# Patient Record
Sex: Female | Born: 1945 | Race: White | Hispanic: No | Smoking: Former smoker
Health system: Southern US, Community
[De-identification: ages and names within clinical notes are randomized; demographics above are authoritative.]

## PROBLEM LIST (undated history)

## (undated) DIAGNOSIS — I700 Atherosclerosis of aorta: Secondary | ICD-10-CM

## (undated) DIAGNOSIS — D699 Hemorrhagic condition, unspecified: Secondary | ICD-10-CM

## (undated) DIAGNOSIS — I10 Essential (primary) hypertension: Secondary | ICD-10-CM

## (undated) DIAGNOSIS — I5032 Chronic diastolic (congestive) heart failure: Secondary | ICD-10-CM

## (undated) DIAGNOSIS — C439 Malignant melanoma of skin, unspecified: Secondary | ICD-10-CM

## (undated) DIAGNOSIS — H9190 Unspecified hearing loss, unspecified ear: Secondary | ICD-10-CM

## (undated) DIAGNOSIS — I639 Cerebral infarction, unspecified: Secondary | ICD-10-CM

## (undated) DIAGNOSIS — R3915 Urgency of urination: Secondary | ICD-10-CM

## (undated) DIAGNOSIS — Z974 Presence of external hearing-aid: Secondary | ICD-10-CM

## (undated) DIAGNOSIS — K589 Irritable bowel syndrome without diarrhea: Secondary | ICD-10-CM

## (undated) DIAGNOSIS — K635 Polyp of colon: Secondary | ICD-10-CM

## (undated) DIAGNOSIS — R51 Headache: Secondary | ICD-10-CM

## (undated) DIAGNOSIS — K219 Gastro-esophageal reflux disease without esophagitis: Secondary | ICD-10-CM

## (undated) DIAGNOSIS — I6523 Occlusion and stenosis of bilateral carotid arteries: Secondary | ICD-10-CM

## (undated) DIAGNOSIS — E785 Hyperlipidemia, unspecified: Secondary | ICD-10-CM

## (undated) DIAGNOSIS — I2510 Atherosclerotic heart disease of native coronary artery without angina pectoris: Secondary | ICD-10-CM

## (undated) DIAGNOSIS — S82401A Unspecified fracture of shaft of right fibula, initial encounter for closed fracture: Secondary | ICD-10-CM

## (undated) DIAGNOSIS — Z8249 Family history of ischemic heart disease and other diseases of the circulatory system: Secondary | ICD-10-CM

## (undated) DIAGNOSIS — K5792 Diverticulitis of intestine, part unspecified, without perforation or abscess without bleeding: Secondary | ICD-10-CM

## (undated) DIAGNOSIS — I471 Supraventricular tachycardia, unspecified: Secondary | ICD-10-CM

## (undated) DIAGNOSIS — K7689 Other specified diseases of liver: Secondary | ICD-10-CM

## (undated) DIAGNOSIS — F419 Anxiety disorder, unspecified: Secondary | ICD-10-CM

## (undated) DIAGNOSIS — I708 Atherosclerosis of other arteries: Secondary | ICD-10-CM

## (undated) DIAGNOSIS — I480 Paroxysmal atrial fibrillation: Secondary | ICD-10-CM

## (undated) DIAGNOSIS — I771 Stricture of artery: Secondary | ICD-10-CM

## (undated) DIAGNOSIS — Z973 Presence of spectacles and contact lenses: Secondary | ICD-10-CM

## (undated) DIAGNOSIS — M1990 Unspecified osteoarthritis, unspecified site: Secondary | ICD-10-CM

## (undated) DIAGNOSIS — N390 Urinary tract infection, site not specified: Secondary | ICD-10-CM

## (undated) DIAGNOSIS — IMO0001 Reserved for inherently not codable concepts without codable children: Secondary | ICD-10-CM

## (undated) HISTORY — DX: Hyperlipidemia, unspecified: E78.5

## (undated) HISTORY — DX: Chronic diastolic (congestive) heart failure: I50.32

## (undated) HISTORY — PX: ANGIOPLASTY: SHX39

## (undated) HISTORY — PX: BREAST SURGERY: SHX581

## (undated) HISTORY — DX: Occlusion and stenosis of bilateral carotid arteries: I65.23

## (undated) HISTORY — DX: Atherosclerosis of aorta: I70.0

## (undated) HISTORY — PX: TONSILLECTOMY: SUR1361

## (undated) HISTORY — PX: MASTOID DEBRIDEMENT: SHX2002

## (undated) HISTORY — PX: EYE SURGERY: SHX253

## (undated) HISTORY — DX: Irritable bowel syndrome, unspecified: K58.9

## (undated) HISTORY — PX: AUGMENTATION MAMMAPLASTY: SUR837

## (undated) HISTORY — DX: Diverticulitis of intestine, part unspecified, without perforation or abscess without bleeding: K57.92

## (undated) HISTORY — PX: ABDOMINAL HYSTERECTOMY: SHX81

## (undated) HISTORY — DX: Family history of ischemic heart disease and other diseases of the circulatory system: Z82.49

## (undated) HISTORY — PX: COLON SURGERY: SHX602

## (undated) HISTORY — PX: APPENDECTOMY: SHX54

## (undated) HISTORY — DX: Unspecified osteoarthritis, unspecified site: M19.90

## (undated) HISTORY — PX: VAGINA SURGERY: SHX829

## (undated) HISTORY — PX: CHOLECYSTECTOMY: SHX55

## (undated) HISTORY — DX: Paroxysmal atrial fibrillation: I48.0

## (undated) HISTORY — PX: TUBAL LIGATION: SHX77

## (undated) HISTORY — DX: Headache: R51

## (undated) HISTORY — DX: Malignant melanoma of skin, unspecified: C43.9

## (undated) HISTORY — PX: JOINT REPLACEMENT: SHX530

## (undated) HISTORY — DX: Atherosclerosis of other arteries: I70.8

## (undated) HISTORY — PX: BREAST EXCISIONAL BIOPSY: SUR124

## (undated) HISTORY — DX: Gastro-esophageal reflux disease without esophagitis: K21.9

## (undated) HISTORY — DX: Atherosclerotic heart disease of native coronary artery without angina pectoris: I25.10

## (undated) HISTORY — PX: TONSILLECTOMY: SHX5217

## (undated) HISTORY — PX: COLONOSCOPY W/ POLYPECTOMY: SHX1380

## (undated) HISTORY — DX: Polyp of colon: K63.5

## (undated) HISTORY — PX: MASTOIDECTOMY REVISION: SUR856

## (undated) HISTORY — PX: SPINE SURGERY: SHX786

## (undated) HISTORY — DX: Cerebral infarction, unspecified: I63.9

## (undated) HISTORY — DX: Hemorrhagic condition, unspecified: D69.9

---

## 1997-10-23 ENCOUNTER — Encounter: Admission: RE | Admit: 1997-10-23 | Discharge: 1998-01-21 | Payer: Self-pay | Admitting: Internal Medicine

## 1998-01-22 ENCOUNTER — Encounter: Admission: RE | Admit: 1998-01-22 | Discharge: 1998-04-15 | Payer: Self-pay | Admitting: Internal Medicine

## 1998-04-15 ENCOUNTER — Encounter: Admission: RE | Admit: 1998-04-15 | Discharge: 1998-07-14 | Payer: Self-pay | Admitting: *Deleted

## 1998-07-05 ENCOUNTER — Inpatient Hospital Stay (HOSPITAL_COMMUNITY): Admission: RE | Admit: 1998-07-05 | Discharge: 1998-07-08 | Payer: Self-pay | Admitting: Neurosurgery

## 1998-07-23 ENCOUNTER — Emergency Department (HOSPITAL_COMMUNITY): Admission: EM | Admit: 1998-07-23 | Discharge: 1998-07-23 | Payer: Self-pay | Admitting: Emergency Medicine

## 1998-07-23 ENCOUNTER — Encounter: Payer: Self-pay | Admitting: Emergency Medicine

## 1998-10-27 ENCOUNTER — Other Ambulatory Visit: Admission: RE | Admit: 1998-10-27 | Discharge: 1998-10-27 | Payer: Self-pay | Admitting: Gynecology

## 1998-10-28 ENCOUNTER — Encounter: Admission: RE | Admit: 1998-10-28 | Discharge: 1998-12-02 | Payer: Self-pay | Admitting: Neurosurgery

## 1999-04-01 ENCOUNTER — Encounter: Admission: RE | Admit: 1999-04-01 | Discharge: 1999-04-01 | Payer: Self-pay | Admitting: Neurosurgery

## 1999-06-28 ENCOUNTER — Encounter: Admission: RE | Admit: 1999-06-28 | Discharge: 1999-06-28 | Payer: Self-pay | Admitting: Neurosurgery

## 1999-09-21 ENCOUNTER — Other Ambulatory Visit: Admission: RE | Admit: 1999-09-21 | Discharge: 1999-09-21 | Payer: Self-pay | Admitting: Gynecology

## 1999-11-20 ENCOUNTER — Emergency Department (HOSPITAL_COMMUNITY): Admission: EM | Admit: 1999-11-20 | Discharge: 1999-11-21 | Payer: Self-pay | Admitting: Emergency Medicine

## 1999-11-20 ENCOUNTER — Encounter: Payer: Self-pay | Admitting: Emergency Medicine

## 2000-01-11 ENCOUNTER — Emergency Department (HOSPITAL_COMMUNITY): Admission: EM | Admit: 2000-01-11 | Discharge: 2000-01-11 | Payer: Self-pay | Admitting: Emergency Medicine

## 2000-01-11 ENCOUNTER — Encounter: Payer: Self-pay | Admitting: Emergency Medicine

## 2000-01-18 ENCOUNTER — Ambulatory Visit (HOSPITAL_COMMUNITY): Admission: RE | Admit: 2000-01-18 | Discharge: 2000-01-18 | Payer: Self-pay | Admitting: *Deleted

## 2000-01-18 ENCOUNTER — Encounter: Payer: Self-pay | Admitting: *Deleted

## 2000-02-03 ENCOUNTER — Ambulatory Visit (HOSPITAL_COMMUNITY): Admission: RE | Admit: 2000-02-03 | Discharge: 2000-02-03 | Payer: Self-pay | Admitting: *Deleted

## 2000-02-03 ENCOUNTER — Encounter: Payer: Self-pay | Admitting: *Deleted

## 2000-03-07 ENCOUNTER — Encounter: Payer: Self-pay | Admitting: Surgery

## 2000-03-07 ENCOUNTER — Encounter (INDEPENDENT_AMBULATORY_CARE_PROVIDER_SITE_OTHER): Payer: Self-pay | Admitting: Specialist

## 2000-03-07 ENCOUNTER — Observation Stay (HOSPITAL_COMMUNITY): Admission: RE | Admit: 2000-03-07 | Discharge: 2000-03-07 | Payer: Self-pay | Admitting: Surgery

## 2000-07-07 ENCOUNTER — Ambulatory Visit (HOSPITAL_COMMUNITY): Admission: RE | Admit: 2000-07-07 | Discharge: 2000-07-07 | Payer: Self-pay | Admitting: Neurosurgery

## 2000-10-09 ENCOUNTER — Other Ambulatory Visit: Admission: RE | Admit: 2000-10-09 | Discharge: 2000-10-09 | Payer: Self-pay | Admitting: Gynecology

## 2000-12-13 ENCOUNTER — Ambulatory Visit (HOSPITAL_COMMUNITY): Admission: RE | Admit: 2000-12-13 | Discharge: 2000-12-13 | Payer: Self-pay | Admitting: Gastroenterology

## 2001-06-17 ENCOUNTER — Ambulatory Visit (HOSPITAL_COMMUNITY): Admission: RE | Admit: 2001-06-17 | Discharge: 2001-06-17 | Payer: Self-pay | Admitting: Specialist

## 2001-06-17 ENCOUNTER — Encounter: Payer: Self-pay | Admitting: Specialist

## 2001-09-12 ENCOUNTER — Ambulatory Visit (HOSPITAL_COMMUNITY): Admission: RE | Admit: 2001-09-12 | Discharge: 2001-09-12 | Payer: Self-pay | Admitting: Family Medicine

## 2001-09-12 ENCOUNTER — Encounter: Payer: Self-pay | Admitting: Family Medicine

## 2001-10-21 ENCOUNTER — Other Ambulatory Visit: Admission: RE | Admit: 2001-10-21 | Discharge: 2001-10-21 | Payer: Self-pay | Admitting: Gynecology

## 2002-03-19 ENCOUNTER — Encounter: Payer: Self-pay | Admitting: Cardiology

## 2002-05-23 ENCOUNTER — Emergency Department (HOSPITAL_COMMUNITY): Admission: EM | Admit: 2002-05-23 | Discharge: 2002-05-23 | Payer: Self-pay | Admitting: Emergency Medicine

## 2002-05-23 ENCOUNTER — Encounter: Payer: Self-pay | Admitting: Emergency Medicine

## 2002-11-04 ENCOUNTER — Ambulatory Visit (HOSPITAL_COMMUNITY): Admission: RE | Admit: 2002-11-04 | Discharge: 2002-11-04 | Payer: Self-pay | Admitting: Gynecology

## 2002-11-04 ENCOUNTER — Encounter: Payer: Self-pay | Admitting: Gynecology

## 2002-11-04 ENCOUNTER — Other Ambulatory Visit: Admission: RE | Admit: 2002-11-04 | Discharge: 2002-11-04 | Payer: Self-pay | Admitting: Gynecology

## 2003-04-22 ENCOUNTER — Ambulatory Visit (HOSPITAL_COMMUNITY): Admission: RE | Admit: 2003-04-22 | Discharge: 2003-04-22 | Payer: Self-pay | Admitting: Family Medicine

## 2003-11-16 ENCOUNTER — Other Ambulatory Visit: Admission: RE | Admit: 2003-11-16 | Discharge: 2003-11-16 | Payer: Self-pay | Admitting: Gynecology

## 2003-11-24 ENCOUNTER — Ambulatory Visit (HOSPITAL_COMMUNITY): Admission: RE | Admit: 2003-11-24 | Discharge: 2003-11-24 | Payer: Self-pay | Admitting: Gynecology

## 2004-12-01 ENCOUNTER — Other Ambulatory Visit: Admission: RE | Admit: 2004-12-01 | Discharge: 2004-12-01 | Payer: Self-pay | Admitting: Gynecology

## 2004-12-01 ENCOUNTER — Ambulatory Visit (HOSPITAL_COMMUNITY): Admission: RE | Admit: 2004-12-01 | Discharge: 2004-12-01 | Payer: Self-pay | Admitting: Gynecology

## 2005-06-29 ENCOUNTER — Ambulatory Visit (HOSPITAL_COMMUNITY): Admission: RE | Admit: 2005-06-29 | Discharge: 2005-06-29 | Payer: Self-pay | Admitting: Gynecology

## 2006-01-01 ENCOUNTER — Ambulatory Visit (HOSPITAL_COMMUNITY): Admission: RE | Admit: 2006-01-01 | Discharge: 2006-01-01 | Payer: Self-pay | Admitting: Gynecology

## 2006-01-18 ENCOUNTER — Other Ambulatory Visit: Admission: RE | Admit: 2006-01-18 | Discharge: 2006-01-18 | Payer: Self-pay | Admitting: Gynecology

## 2006-02-26 ENCOUNTER — Emergency Department (HOSPITAL_COMMUNITY): Admission: EM | Admit: 2006-02-26 | Discharge: 2006-02-26 | Payer: Self-pay | Admitting: Emergency Medicine

## 2006-09-08 ENCOUNTER — Encounter: Payer: Self-pay | Admitting: Gynecology

## 2006-09-08 ENCOUNTER — Inpatient Hospital Stay (HOSPITAL_COMMUNITY): Admission: EM | Admit: 2006-09-08 | Discharge: 2006-09-11 | Payer: Self-pay | Admitting: Emergency Medicine

## 2007-02-06 ENCOUNTER — Ambulatory Visit (HOSPITAL_COMMUNITY): Admission: RE | Admit: 2007-02-06 | Discharge: 2007-02-06 | Payer: Self-pay | Admitting: Surgery

## 2007-03-11 ENCOUNTER — Ambulatory Visit (HOSPITAL_COMMUNITY): Admission: RE | Admit: 2007-03-11 | Discharge: 2007-03-11 | Payer: Self-pay | Admitting: Gynecology

## 2007-03-11 ENCOUNTER — Other Ambulatory Visit: Admission: RE | Admit: 2007-03-11 | Discharge: 2007-03-11 | Payer: Self-pay | Admitting: Gynecology

## 2007-08-22 ENCOUNTER — Encounter: Admission: RE | Admit: 2007-08-22 | Discharge: 2007-08-22 | Payer: Self-pay | Admitting: Surgery

## 2007-10-03 ENCOUNTER — Encounter (INDEPENDENT_AMBULATORY_CARE_PROVIDER_SITE_OTHER): Payer: Self-pay | Admitting: Surgery

## 2007-10-03 ENCOUNTER — Inpatient Hospital Stay (HOSPITAL_COMMUNITY): Admission: RE | Admit: 2007-10-03 | Discharge: 2007-10-09 | Payer: Self-pay | Admitting: Orthopedic Surgery

## 2007-10-22 LAB — HM COLONOSCOPY

## 2007-11-12 ENCOUNTER — Ambulatory Visit (HOSPITAL_BASED_OUTPATIENT_CLINIC_OR_DEPARTMENT_OTHER): Admission: RE | Admit: 2007-11-12 | Discharge: 2007-11-13 | Payer: Self-pay | Admitting: Orthopedic Surgery

## 2008-01-09 ENCOUNTER — Encounter: Admission: RE | Admit: 2008-01-09 | Discharge: 2008-04-08 | Payer: Self-pay | Admitting: Orthopedic Surgery

## 2008-03-17 ENCOUNTER — Encounter: Payer: Self-pay | Admitting: Internal Medicine

## 2008-03-17 ENCOUNTER — Ambulatory Visit (HOSPITAL_COMMUNITY): Admission: RE | Admit: 2008-03-17 | Discharge: 2008-03-17 | Payer: Self-pay | Admitting: Gynecology

## 2008-12-01 ENCOUNTER — Emergency Department (HOSPITAL_COMMUNITY): Admission: EM | Admit: 2008-12-01 | Discharge: 2008-12-01 | Payer: Self-pay | Admitting: Emergency Medicine

## 2008-12-16 ENCOUNTER — Ambulatory Visit: Payer: Self-pay | Admitting: Internal Medicine

## 2008-12-16 DIAGNOSIS — M2390 Unspecified internal derangement of unspecified knee: Secondary | ICD-10-CM | POA: Insufficient documentation

## 2008-12-16 DIAGNOSIS — E785 Hyperlipidemia, unspecified: Secondary | ICD-10-CM | POA: Insufficient documentation

## 2008-12-16 DIAGNOSIS — Z860100 Personal history of colon polyps, unspecified: Secondary | ICD-10-CM | POA: Insufficient documentation

## 2008-12-16 DIAGNOSIS — R519 Headache, unspecified: Secondary | ICD-10-CM | POA: Insufficient documentation

## 2008-12-16 DIAGNOSIS — Z8719 Personal history of other diseases of the digestive system: Secondary | ICD-10-CM | POA: Insufficient documentation

## 2008-12-16 DIAGNOSIS — M1990 Unspecified osteoarthritis, unspecified site: Secondary | ICD-10-CM | POA: Insufficient documentation

## 2008-12-16 DIAGNOSIS — K219 Gastro-esophageal reflux disease without esophagitis: Secondary | ICD-10-CM | POA: Insufficient documentation

## 2008-12-16 DIAGNOSIS — R51 Headache: Secondary | ICD-10-CM | POA: Insufficient documentation

## 2009-04-06 ENCOUNTER — Encounter: Payer: Self-pay | Admitting: Internal Medicine

## 2009-04-06 ENCOUNTER — Ambulatory Visit (HOSPITAL_COMMUNITY): Admission: RE | Admit: 2009-04-06 | Discharge: 2009-04-06 | Payer: Self-pay | Admitting: Gynecology

## 2009-04-19 ENCOUNTER — Encounter: Payer: Self-pay | Admitting: Internal Medicine

## 2009-04-28 ENCOUNTER — Ambulatory Visit: Payer: Self-pay | Admitting: Internal Medicine

## 2009-04-28 DIAGNOSIS — R799 Abnormal finding of blood chemistry, unspecified: Secondary | ICD-10-CM | POA: Insufficient documentation

## 2009-04-28 DIAGNOSIS — R109 Unspecified abdominal pain: Secondary | ICD-10-CM | POA: Insufficient documentation

## 2009-04-28 DIAGNOSIS — R7303 Prediabetes: Secondary | ICD-10-CM | POA: Insufficient documentation

## 2009-04-28 LAB — CONVERTED CEMR LAB
ALT: 20 units/L (ref 0–35)
AST: 23 units/L (ref 0–37)
Alkaline Phosphatase: 54 units/L (ref 39–117)
Basophils Absolute: 0.1 10*3/uL (ref 0.0–0.1)
Bilirubin Urine: NEGATIVE
Bilirubin, Direct: 0.1 mg/dL (ref 0.0–0.3)
CRP, High Sensitivity: 4.8 (ref 0.00–5.00)
Calcium: 9.8 mg/dL (ref 8.4–10.5)
Chloride: 101 meq/L (ref 96–112)
Creatinine, Ser: 1.2 mg/dL (ref 0.4–1.2)
Eosinophils Absolute: 0.1 10*3/uL (ref 0.0–0.7)
Hemoglobin: 13.3 g/dL (ref 12.0–15.0)
Ketones, ur: NEGATIVE mg/dL
Leukocytes, UA: NEGATIVE
Lymphocytes Relative: 27.2 % (ref 12.0–46.0)
MCHC: 33 g/dL (ref 30.0–36.0)
Neutro Abs: 2.9 10*3/uL (ref 1.4–7.7)
Platelets: 248 10*3/uL (ref 150.0–400.0)
RDW: 12.5 % (ref 11.5–14.6)
Sodium: 136 meq/L (ref 135–145)
Specific Gravity, Urine: 1.01 (ref 1.000–1.030)
Total CK: 115 units/L (ref 7–177)
Total Protein: 6.8 g/dL (ref 6.0–8.3)
Urobilinogen, UA: 0.2 (ref 0.0–1.0)

## 2009-04-28 LAB — HM DIABETES FOOT EXAM

## 2009-04-29 ENCOUNTER — Encounter: Payer: Self-pay | Admitting: Internal Medicine

## 2009-05-03 ENCOUNTER — Encounter: Admission: RE | Admit: 2009-05-03 | Discharge: 2009-05-03 | Payer: Self-pay | Admitting: Internal Medicine

## 2010-02-09 ENCOUNTER — Encounter: Payer: Self-pay | Admitting: Internal Medicine

## 2010-04-20 ENCOUNTER — Ambulatory Visit (HOSPITAL_COMMUNITY)
Admission: RE | Admit: 2010-04-20 | Discharge: 2010-04-20 | Payer: Self-pay | Source: Home / Self Care | Attending: Gynecology | Admitting: Gynecology

## 2010-05-17 NOTE — Letter (Signed)
Summary: Dr.Charles Jen Mow, M.D.  Dr.Charles Jen Mow, M.D.   Imported By: Sherian Rein 04/30/2009 09:09:42  _____________________________________________________________________  External Attachment:    Type:   Image     Comment:   External Document

## 2010-05-17 NOTE — Letter (Signed)
Summary: Dr.Charles Jen Mow, M.D.  Dr.Charles Jen Mow, M.D.   Imported By: Sherian Rein 04/30/2009 09:08:40  _____________________________________________________________________  External Attachment:    Type:   Image     Comment:   External Document

## 2010-05-17 NOTE — Letter (Signed)
Summary: Results Follow-up Letter  [facility]  [address] [place], [place] [address]   Phone: [phone]  Fax: [phone]    04/29/2009  [address] [place], [place]  [address]  Dear Ms. Arlan Organ,   The following are the results of your recent test(s):  Test     Result     CBC       normal Muscle enzyme   normal CRP       normal, 4.8 now Liver/kidney   normal Urine       normal Rheum. test    negative  _________________________________________________________  Please call for an appointment as directed _________________________________________________________ _________________________________________________________ _________________________________________________________  Sincerely,  Sanda Linger MD [facility]

## 2010-05-17 NOTE — Assessment & Plan Note (Signed)
Summary: FU ON LABS FROM DR. CHARLES LOMAX/NWS  #   Vital Signs:  Patient profile:   65 year old female Height:      63 inches Weight:      161 pounds O2 Sat:      96 % on Room air Temp:     97.6 degrees F oral Pulse rate:   84 / minute Pulse rhythm:   regular Resp:     16 per minute BP sitting:   128 / 72  (left arm) Cuff size:   large  Vitals Entered By: Rock Nephew CMA (April 28, 2009 3:48 PM)  O2 Flow:  Room air  Primary Care Provider:  Etta Grandchild MD   History of Present Illness: She returns to review labs that were done by Dr. Nicholas Lose, [facility]. She had a high CRP at 24.   Dyspepsia History:      She has no alarm features of dyspepsia including no history of melena, hematochezia, dysphagia, persistent vomiting, or involuntary weight loss > 5%.  There is a prior history of GERD.  The patient does not have a prior history of documented ulcer disease.  The dominant symptom is heartburn or acid reflux.  An H-2 blocker medication is currently being taken.  She notes that the symptoms have improved with the H-2 blocker therapy.  Symptoms have not persisted after 4 weeks of H-2 blocker treatment.  A prior EGD has been done which showed moderate or severe esophagitis.     Preventive Screening-Counseling & Management  Alcohol-Tobacco     Alcohol drinks/day: 0     Smoking Status: never  Hep-HIV-STD-Contraception     Hepatitis Risk: no risk noted     HIV Risk: no risk noted     STD Risk: no risk noted     SBE monthly: yes      Sexual History:  currently monogamous.        Drug Use:  never.        Blood Transfusions:  no.    Current Medications (verified): 1)  Celebrex 200 Mg Caps (Celecoxib) .... Take 1 Tablet By Mouth Once A Day 2)  Lotronex 1 Mg Tabs (Alosetron Hcl) .... Take 1 Tablet By Mouth Two Times A Day 3)  Nexium 40 Mg Cpdr (Esomeprazole Magnesium) .... Take 1 Tablet By Mouth Two Times A Day 4)  Crestor 10 Mg Tabs (Rosuvastatin Calcium) .... Take 1 Tablet By  Mouth Once A Day 5)  Trazodone Hcl 100 Mg Tabs (Trazodone Hcl) .... Take 1 Tab By Mouth At Bedtime 6)  Vivelle-Dot 0.05 Mg/24hr Pttw (Estradiol) .... X 2wk 7)  Xanax 0.5 Mg Tabs (Alprazolam) .... As Needed 8)  Align  Caps (Probiotic Product) .... Take 1 Tablet By Mouth Once A Day 9)  Zyrtec Allergy 10 Mg Tabs (Cetirizine Hcl) .... Take 1 Tablet By Mouth Once A Day 10)  Fish Oil 1000mg  .... 2 Tabs Once Daily 11)  Calcium Citrate .... Take 1 Tablet By Mouth Two Times A Day 12)  Vitamin D3 2000 Unit Caps (Cholecalciferol) .... Take 1 Tablet By Mouth Once A Day 13)  Magnesium Oxide .... Take 1 Tablet By Mouth Once A Day  Allergies (verified): 1)  ! Daypro (Oxaprozin) 2)  ! Zocor  Past History:  Past Medical History: Colonic polyps, hx of Diverticulitis, hx of GERD Headache Hyperlipidemia Osteoarthritis Diabetes mellitus, type II  Past Surgical History: Reviewed history from 12/16/2008 and no changes required. Appendectomy Cervical fusion Cholecystectomy Colectomy Hysterectomy  Tonsillectomy  Family History: Reviewed history from 12/16/2008 and no changes required. Family History High cholesterol Family History Hypertension Family History Lung cancer Family History Other cancer: eso and stomach Family History of Stroke M 1st degree relative <50  Social History: Reviewed history from 12/16/2008 and no changes required. Occupation: Infusion nurse at [employer] Alcohol use-yes Drug use-no Regular exercise-no Smoking Status:  never Hepatitis Risk:  no risk noted HIV Risk:  no risk noted STD Risk:  no risk noted Sexual History:  currently monogamous Drug Use:  never Blood Transfusions:  no  Review of Systems  The patient denies anorexia, fever, weight loss, weight gain, chest pain, syncope, dyspnea on exertion, peripheral edema, prolonged cough, headaches, hemoptysis, abdominal pain, melena, hematochezia, severe indigestion/heartburn, hematuria, suspicious  skin lesions, abnormal bleeding, enlarged lymph nodes, and angioedema.   GI:  Complains of abdominal pain and diarrhea; denies change in bowel habits, excessive appetite, gas, indigestion, loss of appetite, nausea, vomiting, vomiting blood, and yellowish skin color. MS:  Complains of muscle aches and cramps; denies joint pain, joint redness, joint swelling, loss of strength, low back pain, mid back pain, muscle weakness, stiffness, and thoracic pain.  Physical Exam  General:  alert, well-developed, well-nourished, well-hydrated, appropriate dress, normal appearance, healthy-appearing, cooperative to examination, and good hygiene.   Head:  normocephalic, atraumatic, no abnormalities observed, and no abnormalities palpated.   Eyes:  No corneal or conjunctival inflammation noted. EOMI. Perrla. Funduscopic exam benign, without hemorrhages, exudates or papilledema. Vision grossly normal. Mouth:  Oral mucosa and oropharynx without lesions or exudates.  Teeth in good repair. Neck:  supple, full ROM, no masses, no thyromegaly, no JVD, normal carotid upstroke, no carotid bruits, no cervical lymphadenopathy, and no neck tenderness.   Chest Wall:  No deformities, masses, or tenderness noted. Breasts:  No mass, nodules, thickening, tenderness, bulging, retraction, inflamation, nipple discharge or skin changes noted.   Lungs:  Normal respiratory effort, chest expands symmetrically. Lungs are clear to auscultation, no crackles or wheezes. Heart:  Normal rate and regular rhythm. S1 and S2 normal without gallop, murmur, click, rub or other extra sounds. Abdomen:  soft, normal bowel sounds, no distention, no masses, no guarding, no rigidity, no rebound tenderness, no abdominal hernia, no inguinal hernia, no hepatomegaly, no splenomegaly, and RLQ tenderness.   Msk:  normal ROM, no joint tenderness, no joint swelling, no joint warmth, no redness over joints, no joint deformities, no joint instability, and no  crepitation.   Pulses:  R and L carotid,radial,femoral,dorsalis pedis and posterior tibial pulses are full and equal bilaterally Extremities:  No clubbing, cyanosis, edema, or deformity noted with normal full range of motion of all joints.   Neurologic:  No cranial nerve deficits noted. Station and gait are normal. Plantar reflexes are down-going bilaterally. DTRs are symmetrical throughout. Sensory, motor and coordinative functions appear intact. Skin:  Intact without suspicious lesions or rashes Cervical Nodes:  no anterior cervical adenopathy and no posterior cervical adenopathy.   Axillary Nodes:  no R axillary adenopathy and no L axillary adenopathy.   Inguinal Nodes:  no R inguinal adenopathy and no L inguinal adenopathy.   Psych:  Cognition and judgment appear intact. Alert and cooperative with normal attention span and concentration. No apparent delusions, illusions, hallucinations  Diabetes Management Exam:    Foot Exam (with socks and/or shoes not present):       Sensory-Pinprick/Light touch:          Left medial foot (L-4): normal  Left dorsal foot (L-5): normal          Left lateral foot (S-1): normal          Right medial foot (L-4): normal          Right dorsal foot (L-5): normal          Right lateral foot (S-1): normal       Sensory-Monofilament:          Left foot: normal          Right foot: normal       Inspection:          Left foot: normal          Right foot: normal       Nails:          Left foot: normal          Right foot: normal   Impression & Recommendations:  Problem # 1:  DIABETES MELLITUS, TYPE II (ICD-250.00) Assessment New A1C done by Dr. 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 Orders: Venipuncture (78469) TLB-Hepatic/Liver Function Pnl (80076-HEPATIC) TLB-CBC Platelet - w/Differential (85025-CBCD) TLB-BMP (Basic  Metabolic Panel-BMET) (80048-METABOL) TLB-CK Total Only(Creatine Kinase/CPK) (82550-CK) TLB-CRP-High Sensitivity (C-Reactive Protein) (86140-FCRP) TLB-Udip ONLY (81003-UDIP) TLB-Rheumatoid Factor (RA) (62952-WU) T-Antinuclear Antib (ANA) (13244-01027)  Problem # 3:  PELVIC PAIN, RIGHT (ICD-789.09) Assessment: New will get an ultrasound done to look at the right ovary Her updated medication list for this problem includes:    Celebrex 200 Mg Caps (Celecoxib) .Marland Kitchen... Take 1 tablet by mouth once a day  Orders: Venipuncture (25366) TLB-Hepatic/Liver Function Pnl (80076-HEPATIC) TLB-CBC Platelet - w/Differential (85025-CBCD) TLB-BMP (Basic Metabolic Panel-BMET) (80048-METABOL) TLB-CK Total Only(Creatine Kinase/CPK) (82550-CK) TLB-CRP-High Sensitivity (C-Reactive Protein) (86140-FCRP) TLB-Udip ONLY (81003-UDIP) TLB-Rheumatoid Factor (RA) (44034-VQ) T-Antinuclear Antib (ANA) (25956-38756) Radiology Referral (Radiology)  Problem # 4:  HYPERLIPIDEMIA (ICD-272.4) Assessment: Unchanged  Her updated medication list for this problem includes:    Crestor 10 Mg Tabs (Rosuvastatin calcium) .Marland Kitchen... Take 1 tablet by mouth once a day  Orders: Venipuncture (43329) TLB-Hepatic/Liver Function Pnl (80076-HEPATIC) TLB-CBC Platelet - w/Differential (85025-CBCD) TLB-BMP (Basic Metabolic Panel-BMET) (80048-METABOL) TLB-CK Total Only(Creatine Kinase/CPK) (82550-CK) TLB-CRP-High Sensitivity (C-Reactive Protein) (86140-FCRP) TLB-Udip ONLY (81003-UDIP) TLB-Rheumatoid Factor (RA) (51884-ZY) T-Antinuclear Antib (ANA) (60630-16010)  Complete Medication List: 1)  Celebrex 200 Mg Caps (Celecoxib) .... Take 1 tablet by mouth once a day 2)  Lotronex 1 Mg Tabs (Alosetron hcl) .... Take 1 tablet by mouth two times a day 3)  Nexium 40 Mg Cpdr (Esomeprazole magnesium) .... Take 1 tablet by mouth two times a day 4)  Crestor 10 Mg Tabs (Rosuvastatin calcium) .... Take 1 tablet by mouth once a day 5)  Trazodone Hcl 100  Mg Tabs (Trazodone hcl) .... Take 1 tab by mouth at bedtime 6)  Vivelle-dot 0.05 Mg/24hr Pttw (Estradiol) .... X 2wk 7)  Xanax 0.5 Mg Tabs (Alprazolam) .... As needed 8)  Align Caps (Probiotic product) .... Take 1 tablet by mouth once a day 9)  Zyrtec Allergy 10 Mg Tabs (Cetirizine hcl) .... Take 1 tablet by mouth once a day 10)  Fish Oil 1000mg   .... 2 tabs once daily 11)  Calcium Citrate  .... Take 1 tablet by mouth two times a day 12)  Vitamin D3 2000 Unit Caps (Cholecalciferol) .... Take 1 tablet by mouth once a day 13)  Magnesium Oxide  .... Take  1 tablet by mouth once a day  Other Orders: Pneumococcal Vaccine Ped < 41yrs (81191) Admin 1st Vaccine (47829) Zoster (Shingles) Vaccine Live (56213) Admin of Any Addtl Vaccine (08657)  Patient Instructions: 1)  Please schedule a follow-up appointment in 2 weeks. 2)  It is important that you exercise regularly at least 20 minutes 5 times a week. If you develop chest pain, have severe difficulty breathing, or feel very tired , stop exercising immediately and seek medical attention. 3)  You need to lose weight. Consider a lower calorie diet and regular exercise.  4)  Check your blood sugars regularly. If your readings are usually above 200  or below 70 you should contact our office. 5)  It is important that your Diabetic A1c level is checked every 3 months. 6)  See your eye doctor yearly to check for diabetic eye damage. 7)  Check your feet each night for sore areas, calluses or signs of infection. 8)  Check your Blood Pressure regularly. If it is above: you should make an appointment.   Immunizations Administered:  Pediatric Pneumococcal Vaccine:    Vaccine Type: Prevnar    Site: right deltoid    Mfr: Merck    Dose: 0.5 ml    Route: IM    Given by: Ami Bullins CMA    Exp. Date: 03/27/2010    Lot #: [id number]    VIS given: 03/26/07 version given April 28, 2009.  Zostavax # 1:    Vaccine Type: Zostavax    Site: left deltoid    Mfr:  Merck    Dose: 0.5 ml    Route: IM    Given by: Ami Bullins CMA    Exp. Date: 05/14/2010    Lot #: [id number]    VIS given: 01/27/05 given April 28, 2009.

## 2010-05-17 NOTE — Letter (Signed)
Summary: [facility]   Imported By: Sherian Rein 02/18/2010 08:28:40  _____________________________________________________________________  External Attachment:    Type:   Image     Comment:   External Document

## 2010-08-19 ENCOUNTER — Encounter: Payer: Self-pay | Admitting: Internal Medicine

## 2010-08-22 ENCOUNTER — Encounter: Payer: Self-pay | Admitting: Internal Medicine

## 2010-08-22 ENCOUNTER — Other Ambulatory Visit (INDEPENDENT_AMBULATORY_CARE_PROVIDER_SITE_OTHER): Payer: Medicare Other

## 2010-08-22 ENCOUNTER — Ambulatory Visit (INDEPENDENT_AMBULATORY_CARE_PROVIDER_SITE_OTHER): Payer: Medicare Other | Admitting: Internal Medicine

## 2010-08-22 DIAGNOSIS — E785 Hyperlipidemia, unspecified: Secondary | ICD-10-CM

## 2010-08-22 DIAGNOSIS — I10 Essential (primary) hypertension: Secondary | ICD-10-CM

## 2010-08-22 DIAGNOSIS — R799 Abnormal finding of blood chemistry, unspecified: Secondary | ICD-10-CM

## 2010-08-22 DIAGNOSIS — R51 Headache: Secondary | ICD-10-CM

## 2010-08-22 DIAGNOSIS — E119 Type 2 diabetes mellitus without complications: Secondary | ICD-10-CM

## 2010-08-22 LAB — URINALYSIS, ROUTINE W REFLEX MICROSCOPIC
Leukocytes, UA: NEGATIVE
Nitrite: NEGATIVE
Specific Gravity, Urine: <=1.005 (ref 1.000–1.030)
Urine Glucose: NEGATIVE
Urobilinogen, UA: 0.2 (ref 0.0–1.0)

## 2010-08-22 LAB — HIGH SENSITIVITY CRP: CRP, High Sensitivity: 4.36 mg/L (ref 0.00–5.00)

## 2010-08-22 LAB — CBC WITH DIFFERENTIAL/PLATELET
Basophils Relative: 0.7 % (ref 0.0–3.0)
Eosinophils Relative: 1.8 % (ref 0.0–5.0)
MCV: 96.7 fl (ref 78.0–100.0)
Monocytes Absolute: 0.5 10*3/uL (ref 0.1–1.0)
Monocytes Relative: 8.4 % (ref 3.0–12.0)
Neutrophils Relative %: 60 % (ref 43.0–77.0)
RBC: 3.92 Mil/uL (ref 3.87–5.11)
WBC: 5.4 10*3/uL (ref 4.5–10.5)

## 2010-08-22 LAB — TSH: TSH: 0.86 u[IU]/mL (ref 0.35–5.50)

## 2010-08-22 LAB — LIPID PANEL: VLDL: 21.2 mg/dL (ref 0.0–40.0)

## 2010-08-22 MED ORDER — OLMESARTAN MEDOXOMIL 40 MG PO TABS
40.0000 mg | ORAL_TABLET | Freq: Every day | ORAL | Status: DC
Start: 2010-08-22 — End: 2011-08-22

## 2010-08-22 NOTE — Patient Instructions (Signed)
Diabetes, Type 2 Diabetes is a lasting (chronic) disease. In type 2 diabetes, the pancreas does not make enough insulin (a hormone), and the body does not respond normally to the insulin that is made. This type of diabetes was also previously called adult onset diabetes. About 90% of all those who have diabetes have type 2. It usually occurs after the age of 40 but can occur at any age. CAUSES Unlike type 1 diabetes, which happens because insulin is no longer being made, type 2 diabetes happens because the body is making less insulin and has trouble using the insulin properly. SYMPTOMS  Drinking more than usual.   Urinating more than usual.   Blurred vision.   Dry, itchy skin.   Frequent infection like yeast infections in women.   More tired than usual (fatigue).  TREATMENT  Healthy eating.   Exercise.   Medication, if needed.   Monitoring blood glucose (sugar).   Seeing your caregiver regularly.  HOME CARE INSTRUCTIONS  Check your blood glucose (sugar) at least once daily. More frequent monitoring may be necessary, depending on your medications and on how well your diabetes is controlled. Your caregiver will advise you.   Take your medicine as directed by your caregiver.   Do not smoke.   Make wise food choices. Ask your caregiver for information. Weight loss can improve your diabetes.   Learn about low blood glucose (hypoglycemia) and how to treat it.   Get your eyes checked regularly.   Have a yearly physical exam. Have your blood pressure checked. Get your blood and urine tested.   Wear a pendant or bracelet saying that you have diabetes.   Check your feet every night for sores. Let your caregiver know if you have sores that are not healing.  SEEK MEDICAL CARE IF:  You are having problems keeping your blood glucose at target range.   You feel you might be having problems with your medicines.   You have symptoms of an illness that is not improving after 24  hours.   You have a sore or wound that is not healing.   You notice a change in vision or a new problem with your vision.  You develop a fever of more than 100.5Hypertension (High Blood Pressure) As your heart beats, it forces blood through your arteries. This force is your blood pressure. If the pressure is too high, it is called hypertension (HTN) or high blood pressure. HTN is dangerous because you may have it and not know it. High blood pressure may mean that your heart has to work harder to pump blood. Your arteries may be narrow or stiff. The extra work puts you at risk for heart disease, stroke, and other problems.  Blood pressure consists of two numbers, a higher number over a lower, 110/72, for example. It is stated as "110 over 72." The ideal is below 120 for the top number (systolic) and under 80 for the bottom (diastolic). Write down your blood pressure today. You should pay close attention to your blood pressure if you have certain conditions such as: Heart failure. Prior heart attack.  Diabetes  Chronic kidney disease.  Prior stroke.  Multiple risk factors for heart disease.   To see if you have HTN, your blood pressure should be measured while you are seated with your arm held at the level of the heart. It should be measured at least twice. A one-time elevated blood pressure reading (especially in the Emergency Department) does not mean   that you need treatment. There may be conditions in which the blood pressure is different between your right and left arms. It is important to see your caregiver soon for a recheck. Most people have essential hypertension which means that there is not a specific cause. This type of high blood pressure may be lowered by changing lifestyle factors such as: Stress. Smoking.  Lack of exercise.  Excessive weight. Drug/tobacco/alcohol use.  Eating less salt.   Most people do not have symptoms from high blood pressure until it has caused damage to the  body. Effective treatment can often prevent, delay or reduce that damage. TREATMENT Treatment for high blood pressure, when a cause has been identified, is directed at the cause. There are a large number of medications to treat HTN. These fall into several categories, and your caregiver will help you select the medicines that are best for you. Medications may have side effects. You should review side effects with your caregiver. If your blood pressure stays high after you have made lifestyle changes or started on medicines,  Your medication(s) may need to be changed.  Other problems may need to be addressed.  Be certain you understand your prescriptions, and know how and when to take your medicine.  Be sure to follow up with your caregiver within the time frame advised (usually within two weeks) to have your blood pressure rechecked and to review your medications.  If you are taking more than one medicine to lower your blood pressure, make sure you know how and at what times they should be taken. Taking two medicines at the same time can result in blood pressure that is too low.  SEEK IMMEDIATE MEDICAL CARE IF YOU DEVELOP: A severe headache, blurred or changing vision, or confusion.  Unusual weakness or numbness, or a faint feeling.  Severe chest or abdominal pain, vomiting, or breathing problems.  MAKE SURE YOU:  Understand these instructions.  Will watch your condition.  Will get help right away if you are not doing well or get worse.  Document Released: 04/03/2005 Document Re-Released: 09/21/2009  [employer] Patient Information 2011 [employer]  Document Released: 04/03/2005 Document Re-Released: 04/25/2009 [employer] Patient Information 2011 [employer]. 

## 2010-08-22 NOTE — Progress Notes (Signed)
Subjective:    Patient ID: Susan Gillespie, female    DOB: [birth year], 65 y.o.   MRN: [id number]  Hypertension This is a new problem. The current episode started 1 to 4 weeks ago. The problem has been rapidly worsening since onset. The problem is uncontrolled. Associated symptoms include headaches. Pertinent negatives include no anxiety, blurred vision, chest pain, malaise/fatigue, neck pain, orthopnea, palpitations, peripheral edema, PND, shortness of breath or sweats. There are no associated agents to hypertension. Past treatments include nothing. Compliance problems include exercise.   Headache  Pertinent negatives include no abdominal pain, back pain, blurred vision, coughing, dizziness, fever, nausea, neck pain, numbness, seizures, visual change, vomiting, weakness or weight loss. Her past medical history is significant for hypertension.  Diabetes She presents for her follow-up diabetic visit. She has type 2 diabetes mellitus. No MedicAlert identification noted. Her disease course has been improving. Hypoglycemia symptoms include headaches. Pertinent negatives for hypoglycemia include no confusion, dizziness, hunger, mood changes, nervousness/anxiousness, pallor, seizures, sleepiness, speech difficulty, sweats or tremors. Pertinent negatives for diabetes include no blurred vision, no chest pain, no fatigue, no foot paresthesias, no foot ulcerations, no polydipsia, no polyphagia, no polyuria, no visual change, no weakness and no weight loss. There are no hypoglycemic complications. Symptoms are stable. There are no diabetic complications. Risk factors for coronary artery disease include no known risk factors. Current diabetic treatment includes diet. She is compliant with treatment some of the time. Her weight is stable. She is following a generally healthy diet. Meal planning includes avoidance of concentrated sweets. She has not had a previous visit with a dietician. She participates in exercise  intermittently. There is no change in her home blood glucose trend. Her breakfast blood glucose range is generally 90-110 mg/dl. Her lunch blood glucose range is generally 90-110 mg/dl. Her dinner blood glucose range is generally 90-110 mg/dl. Her highest blood glucose is 90-110 mg/dl. Her overall blood glucose range is 90-110 mg/dl. An ACE inhibitor/angiotensin II receptor blocker is not being taken. She sees a podiatrist.Eye exam is current.      Review of Systems  Constitutional: Negative for fever, chills, weight loss, malaise/fatigue, diaphoresis, activity change, appetite change, fatigue and unexpected weight change.  HENT: Negative for facial swelling, neck pain and neck stiffness.   Eyes: Negative for blurred vision.  Respiratory: Negative for apnea, cough, choking, chest tightness, shortness of breath, wheezing and stridor.   Cardiovascular: Negative for chest pain, palpitations, orthopnea, leg swelling and PND.  Gastrointestinal: Negative for nausea, vomiting, abdominal pain, diarrhea, constipation and abdominal distention.  Genitourinary: Negative for dysuria, urgency, polyuria, frequency, hematuria, decreased urine volume, enuresis, difficulty urinating and dyspareunia.  Musculoskeletal: Negative for myalgias, back pain, joint swelling, arthralgias and gait problem.  Skin: Negative for color change, pallor and rash.  Neurological: Positive for headaches. Negative for dizziness, tremors, seizures, syncope, facial asymmetry, speech difficulty, weakness, light-headedness and numbness.  Hematological: Negative for polydipsia, polyphagia and adenopathy. Does not bruise/bleed easily.  Psychiatric/Behavioral: Negative for suicidal ideas, hallucinations, behavioral problems, confusion, sleep disturbance, self-injury, dysphoric mood, decreased concentration and agitation. The patient is not nervous/anxious and is not hyperactive.        Objective:   Physical Exam  Constitutional: She is  oriented to person, place, and time. She appears well-developed and well-nourished. No distress.  HENT:  Head: Normocephalic and atraumatic.  Right Ear: External ear normal.  Left Ear: External ear normal.  Nose: Nose normal.  Mouth/Throat: Oropharynx is clear and moist. No oropharyngeal exudate.  Eyes:  Conjunctivae and EOM are normal. Pupils are equal, round, and reactive to light. Right eye exhibits no discharge. Left eye exhibits no discharge. No scleral icterus.  Neck: Normal range of motion. Neck supple. No JVD present. No tracheal deviation present. No thyromegaly present.  Cardiovascular: Normal rate, regular rhythm, normal heart sounds and intact distal pulses.  Exam reveals no gallop and no friction rub.   No murmur heard. Pulmonary/Chest: Effort normal and breath sounds normal. No stridor. No respiratory distress. She has no wheezes. She has no rales. She exhibits no tenderness.  Abdominal: Soft. Bowel sounds are normal. She exhibits no distension and no mass. There is no tenderness. There is no rebound and no guarding.  Musculoskeletal: Normal range of motion. She exhibits no edema and no tenderness.  Lymphadenopathy:    She has no cervical adenopathy.  Neurological: She is alert and oriented to person, place, and time. She has normal reflexes. She displays normal reflexes. No cranial nerve deficit. She exhibits normal muscle tone. Coordination normal.  Skin: Skin is warm and dry. No rash noted. She is not diaphoretic. No erythema. No pallor.  Psychiatric: She has a normal mood and affect. Her behavior is normal. Judgment and thought content normal.        Lab Results  Component Value Date   WBC 4.9 04/28/2009   HGB 13.3 04/28/2009   HCT 40.4 04/28/2009   PLT 248.0 04/28/2009   ALT 20 04/28/2009   AST 23 04/28/2009   NA 136 04/28/2009   K 4.6 04/28/2009   CL 101 04/28/2009   CREATININE 1.2 04/28/2009   BUN 12 04/28/2009   CO2 29 04/28/2009    Assessment & Plan:

## 2010-08-22 NOTE — Assessment & Plan Note (Signed)
This is unchanged 

## 2010-08-22 NOTE — Assessment & Plan Note (Signed)
She has no side effects from the statin therapy, I will check her FLP and CMP today

## 2010-08-22 NOTE — Assessment & Plan Note (Addendum)
Her EKG shows no signs of LVH and it is otherwise normal. Start benicar for renal protection and check lytes and renal function

## 2010-08-22 NOTE — Assessment & Plan Note (Signed)
Start benicar for renal protection and check A1C today

## 2010-08-25 ENCOUNTER — Telehealth: Payer: Self-pay | Admitting: *Deleted

## 2010-08-25 NOTE — Telephone Encounter (Signed)
Pt was started on Benicar this week. BP today was 140/94 - This pm at work pt felt dizzy & lightheaded, says BP "dropped" when standing up. She would like advisement from MD

## 2010-08-26 NOTE — Telephone Encounter (Signed)
Patient informed - she will call with any problems

## 2010-08-26 NOTE — Telephone Encounter (Signed)
Continued from below.... I advised pt to monitor BP, call if bp becomes elevated and w/any problems to see another MD in the office next week. Do you advise pt restart Benicar? Lower dose? OR Alt med?   Yetta Barre out of office next week)

## 2010-08-26 NOTE — Telephone Encounter (Signed)
BP dropped to what?

## 2010-08-26 NOTE — Telephone Encounter (Signed)
Spoke w/pt - Says Bp Thursday AM was 142/87 and then 152/91. She took her BP med, Benicar 40 mg 1 qd. 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 BP 82/40's. She did not take med today - reports feeling "washed out" but otherwise ok and BP is back to "normal".

## 2010-08-26 NOTE — Telephone Encounter (Signed)
Reviewed Dr note: med started for BP and renal protection. Recommend Benicar 20 - see if better tolerated. Follow-up with Dr. Yetta Barre as instructed.

## 2010-08-30 NOTE — Op Note (Signed)
NAMEDYNVER, CLEMSON                 ACCOUNT NO.:  [id number]   MEDICAL RECORD NO.:  [id number]          PATIENT TYPE:  AMB   LOCATION:  ENDO                         FACILITY:  [facility]   PHYSICIAN:  Sandria Bales. Ezzard Standing, M.D.  DATE OF BIRTH:  1945-05-24   DATE OF PROCEDURE:  DATE OF DISCHARGE:                               OPERATIVE REPORT   PREOPERATIVE DIAGNOSES:  1. Diverticulosis.  2. History of diverticulitis.  3. History of blood in stool.   POSTOPERATIVE DIAGNOSES:  Significant sigmoid colon diverticulosis with  spasm and scattered diverticula throughout the remainder of her colon.   PROCEDURE:  Colonoscopy.   SURGEON:  Sandria Bales. Ezzard Standing, M.D.   ANESTHESIA:  100 mcg Fentanyl, 7 mg of Versed.   COMPLICATIONS:  None.   INDICATIONS FOR PROCEDURE:  Susan Gillespie is a 65 year old white female  patient of Dr. Billey Chang. [place] whom I have seen for diverticulitis.  She has been followed by Dr. Ritta Slot from a GI standpoint.   She has recently had these 2 bouts where she has had some trouble with  her bowels and then noticed some blood mixed with the stool.  In my  office on the 5th of September with a guaiac of her stool, I did not  appreciate any blood.  I encouraged her to return to Dr. Kinnie Scales for a  repeat colonoscopy and I went back and forth with her on this.  But  apparently the colonoscopy with Dr. Kinnie Scales had significant cost for her.  I encouraged her to call Dr. Kinnie Scales to try to see what arrangements  could be made but she insisted I go ahead and do the colonoscopy.   She has completed a mechanical bowel prep at home using half-lightly but  she says she got nausea and vomiting toward the end of her prep.   The indications and potential complications were explained to the  patient.  The potential complications include but are not limited to  bleeding, perforation of the bowel.   PROCEDURAL NOTE:  The patient was placed in the left lateral decubitus  position.  She had an IV  in her right arm.  She had 2 liters of nasal O2  flowing during the procedure.  She had a pulse oximetry, a blood  pressure cuff and telemetry running during the procedure.  She was given  initially 50 mcg of fentanyl and 4 mg of Versed.  I had to give her an  additional 50 mcg of fentanyl and 3 mg of Versed for the procedure.   Flexible Pentax colonoscope was passed through the rectum without  difficulty.  She had some irritation of the sigmoid colon with  significant sigmoid colon diverticulosis.  She also had a fair amount of  spasm of her sigmoid colon associated with the diverticulosis and  advancing the scope through the sigmoid colon caused her a fair amount  of discomfort and this was the reason for the additional IV sedation.  However, once I got through to the sigmoid colon, I saw the rest of her  colon very well. She had  a good prep.  She had a few scattered  diverticula noticed in the transverse colon and right colon. I  identified the ileocecal valve.  I identified what I thought was  appendiceal orifice.  There was no way I was going to be able to advance  the scope into her terminal ileum because she was really not tolerating  advancement of the scope at all so I withdrew the scope slowly, again  visualizing right colon, transverse colon, left colon, all of which were  unremarkable and in her sigmoid colon, again she had significant  diverticulosis, at least at 2 different orifices.  She had some evidence  of some localized inflammation but she had no obvious mucosal  abnormality, no polyp, no growth, no tumor.   The scope was withdrawn through the sigmoid colon and her distal colon  and rectum were unremarkable.  I retroflexed the scope within the rectum  and saw no identifiable lesion.   It is my thought that her blood she sees in her stool is probably from  this irritated section of colon with the diverticular disease, though as  far as advancing the scope through  this area, even through it has a fair  amount of spasm, there was no evidence of any stricture or narrowing of  the lumen.  I was able to advance the scope easily through this area, it  was just uncomfortable on advancement.   The patient tolerated the procedure well, will check back with me for  further problems to cover our questions.  She has a friend, Susan Gillespie,  who is I think her significant other, who was here and I talked with him  at the end of the procedure.      Sandria Bales. Ezzard Standing, M.D.  Electronically Signed     DHN/MEDQ  D:  02/06/2007  T:  02/07/2007  Job:  [id number]   cc:   Gretta Cool, M.D.  Fax: [phone]   Griffith Citron, M.D.  Fax: [phone]

## 2010-08-30 NOTE — H&P (Signed)
NAMECORNELIOUS, DIVEN NO.:  [id number]   MEDICAL RECORD NO.:  [id number]          PATIENT TYPE:  EMS   LOCATION:  ED                           FACILITY:  [facility]   PHYSICIAN:  Lonia Blood, M.D.       DATE OF BIRTH:  10/03/1945   DATE OF ADMISSION:  09/08/2006  DATE OF DISCHARGE:                              HISTORY & PHYSICAL   PRIMARY CARE PHYSICIAN:  Dr. Beather Arbour.   CHIEF COMPLAINT:  Abdominal pain.   HISTORY OF PRESENT ILLNESS:  Mrs. Susan Gillespie is a 65 year old woman with  past medical history of irritable bowel syndrome, who reports that she  developed some severe left lower quadrant abdominal pain 2 days ago.  The patient's symptoms remains persistent and severe, and the patient  reports that, the night prior to admission, she developed a fever of up  to 101.5, and then she decided at the time to come to the emergency  room.  The patient was seen at the [facility] Emergency Room,  where she was found to have a complex mass in the sigmoid colon.  She  was sent to [facility] Emergency Room, where a CT  scan confirmed the  presence of a diverticular abscess.   PAST MEDICAL HISTORY:  Hyperlipidemia, irritable bowel syndrome,  gastroesophageal reflux disease, cholecystectomy, partial hysterectomy,  left thumb reconstructive surgery and cholesteatomas.   MEDICATIONS:  1. Celebrex 200 mg daily.  2. Crestor 10 mg daily.  3. Xanax 0.6125 mg daily.  4. Fish oil twice a day.  5. Lotronex twice a day.  6. Trazodone 100 mg at bedtime.  7. Nexium 40 mg daily.  8. Lexapro 20 mg daily.  9. Vivelle Dot 0.05 mg  twice a week.   SOCIAL HISTORY:  She is divorced.  The patient lives with her boyfriend.  She does not smoke cigarettes, does not use alcohol.   FAMILY HISTORY:  The patient's father had coronary artery disease and  passed away with myocardial infarction.  The patient's grandfather had  gastric cancer.  The patient's cousin has gastric cancer.   REVIEW OF SYSTEMS:  Negative for chest pain.  Negative for shortness of  breath.  Negative for nausea/vomiting, negative for diarrhea.  Negative  for headaches.  Negative for vision changes.  Other systems as per HPI.  All other systems negative.   PHYSICAL EXAMINATION:  VITAL SIGNS:  Upon admission, is a temperature of  98.2, blood pressure 104/55, pulse 79, respirations 18, saturation 96%  on room air.  GENERAL APPEARANCE:  A well-developed, well-nourished woman in no acute  distress, alert, oriented to place, person and time. HEAD:  Normocephalic, atraumatic.  Eyes pupils equal, round, reactive to light.  Extraocular movements intact.  Throat clear.  NECK:  Supple.  No JVD, no carotid bruits.  CHEST:  Clear to auscultation bilaterally without wheezing, rhonchi or  crackles.  Regular rate rhythm without murmurs, rubs or gallops.  ABDOMEN:  Soft.  There is a tenderness in the left lower quadrant to  deep palpation.  No rebound no guarding.  Bowel sounds  are present.   LABORATORY VALUES:  On admission, sodium is 136, potassium 3.8, chloride  101, bicarb 26, BUN 10, creatinine 0.7, glucose 125, calcium 9.6,  albumin 3.2, AST 29, ALT 30.  The patient's had an abdominal ultrasound  with findings of possible diverticulitis.  The patient has a computed  tomography scan of abdomen and pelvis with findings of sigmoid  diverticulitis, with the presence intramural abscess.  The patient also  had a portable chest x-ray which was within normal limits.   ASSESSMENT/PLAN:  1. Acute diverticulitis with intramural abscess.  Mrs. Bewley will be      admitted to the acute care unit of [facility].  The      patient will receive intravenous antibiotics and symptomatic      treatment.  Her abdominal exam and clinical status will be closely      monitored.  In case this patient has persistent symptoms, the      patient will have a follow-up computed tomography scan of the      abdomen, if her  clinical status does not improve, to see if the      abscess is amenable to draining.  Surgical consultation will be      obtained, if the patient status deteriorates.  2. Depression.  Mrs. Tickner will be continued on her trazodone and      Lexapro without changes.  3. Hyperlipidemia.  The patient will receive the Crestor without      changes.  4. Irritable bowel syndrome.  We will continue the patient's Lotronex      without changes.      Lonia Blood, M.D.  Electronically Signed     SL/MEDQ  D:  09/08/2006  T:  09/08/2006  Job:  [id number]   cc:   Gretta Cool, M.D.  Fax: [phone]

## 2010-08-30 NOTE — Op Note (Signed)
NAMEJOSELYNN, Susan Gillespie                 ACCOUNT NO.:  [id number]   MEDICAL RECORD NO.:  [id number]          PATIENT TYPE:  INP   LOCATION:  0005                         [facility]ACILITY:  [facility]   PHYSICIAN:  Susan Gillespie, M.D.  DATE OF BIRTH:  30-Sep-1945   DATE OF PROCEDURE:  10/03/2007  DATE OF DISCHARGE:                               OPERATIVE REPORT   Date of Surgery ??   There are a lot of errors in this note which I have edited.   PREOPERATIVE DIAGNOSIS:  Recurrent sigmoid colon diverticilitis   POSTOPERATIVE DIAGNOSIS:  Recurrent sigmoid colon diverticilitis, evidence of chronic inflammation  is lower sigmoid colon   PROCEDURE:  Laparoscopic assisted sigmoid colectomy, mobilization of the splenic  flexure, appendectomy.   SURGEON:  Susan Gillespie, M.D.   ASSISTANT:  Susan Gillespie, M.D.   ANESTHESIA:  General endotracheal   HISTORY OF ILLNESS:  Susan Gillespie is a 65 year old white female who is a  patient of Dr. Esperanza Gillespie from a medical standpoint, Dr. Ritta Gillespie  from a GI standpoint.  Has had multiple bouts with diverticular disease.  She at one time had a diverticular abscess documented by CT scan.  She  has undergone a colonoscopy, which showed significant sigmoid colonic  diverticulosis but she also has diverticula scattered throughout the  rest of her colon.  She now comes for attempted sigmoid colectomy for  the treatment of her recurrent diverticular disease.   The indication and potential complications were explained to the  patient.  Potential complications include but are not limited to  bleeding, infection, leak from the bowel, recurrence of her  diverticulitis and injuries to ureter, other bowel, bladder.   OPERATIVE NOTE:  The patient was placed in a supine position, given a general  endotracheal anesthetic supervised by Susan Gillespie. Susan Gillespie, M.D.  She  had completed a mechanical and antibiotic bowel prep the day before.  She was placed in a lithotomy  position, given a gram of cefoxitin at the  initiation of the procedure.  Her abdomen was prepped with Techni-Care.  Again, she was in lithotomy and had a Foley catheter in place, an OG  tube which was converted to an NG tube.  A time out was held identifying  the the patient and the procedure.   The abdomen was accessed with a 5-mm Optiview in the right upper  quadrant.  I placed an Optiview in the left lower quadrant, one  suprapubic and one in the left lower quadrant.  She developed a  hematoma around the left lower quadrant trocar that looked stable at the  end of the case.   I first started in the pelvis and as I started dissecting up I took down  the peritoneal reflection of her left colon and mobilized the left colon  medially.  I took down the splenic flexure and I mobilized about halfway  across the left transverse colon.  I thought I had obtained good  mobility of her left transverse colon, splenic flexure and left colon to  proximal sigmoid colon.   I then turned my attention  to the pelvis.  She had some small bowel  stuck down in the pelvis, which I took down with sharp dissection with  scissors; however, the further I got down into the pelvis the more the  sigmoid colon was stuck, and I thought she would be better served just  with this disseciton being done open.   Therefore, I converted to a lower midline open surgery.  I was able to  get my hand down in the pelvis and pull up on the colon and get this  inflammatory area up.  It looked her distal colon had an area of chronic  inflammation consistent with chronic recurrent diverticulitis.   I now had plenty of mobility in my left colon.  I had the sigmoid colon  well-exposed and I was able to get well distally.  I thought I saw this  all well.   I then divided at the junction of the left colon and sigmoid colon.  I  took down the mesentery of the colon primarily with a Harmonic scalpel  and I did suture ligate some  areas in the mesentery with 2-0 chromic  sutures.  I really just needed the mesentery of the sigmoid colon and  never got the retroperitoneum and did not identify the ureter on either  side, but I thought I was well anterior to it on both sides.   Both her tubes and ovaries were free on both sides.  Her uterus was  absent.  I dissected to about 4 or 5 cm above the peritoneal reflection  where I thought her bowel was free of diverticula, and this is where I  divided her distal colon.   I had taken the sigmoid colon out in two segments.  I used a stapler.  The distal segment was long with the staples proximal; the shorter  segment was only about 4 or 5 cm long with staples on the distal end.  I  put a suture on the proximal end, but again this all appeared to be  benign.   I then carried out a hand-sewn anastomosis between the proximal and  distal bowel using interrupted 2-0 silk suture in an inverting fashion.  When I got anteriorly I used the 2-0 silk to close anteriorly.   At this point Susan Gillespie, who was assisting, went below and  sigmoidoscoped the patient.  He got the sigmoidoscope up about 12-14 cm.  My guess is, her anastomosis is probably from 15-20 so he did not see  the anastomosis but did insufflate the colon.  I clamped off the  proximal bowel.  I flooded the pelvis to make sure there was no leak,  and which there was none.   So I thought the anastomosis was patent with no evidence of air leak.  We irrigated the abdomen with 2 L of saline.  She also had a fairly  floppy cecum and I went on and did take her appendix out because the  appendix did lay down in the pelvis so it would not be confused with any  future diverticular disease.  I divided the mesentery of the appendix  with a Harmonic scalpel.  I took the base of he appendix with a 2-0  Vicryl suture and then put a pursestring of 2-0 silk suture to invert  the stump of the appendix.   Again, I irrigated her  abdomen with 2-3 L of saline.  I palpated the NG  tube.  There was no other palpable  mass or lesion.  The small bowel that  I had taken down I inspected and these were just adhesions.  There was  no evidence of any transmural injury to the bowel.  I then closed the  abdomen with two running #1 PDS sutures.  I placed a couple of  interrupted #1 PDS sutures.  I irrigated the skin, closed it with  staples.  She did have a moderate hematoma in her left lower quadrant  from the trocar site, but this looked stable at the end of the  operation. Blood loss I have estimated between 150 to 200 mL.  I left no  drains in.  Her sponge and needle count were correct at the end of the  case.   She was transferred to the recovery room in good condition.      Susan Gillespie, M.D.  Electronically Signed     DHN/MEDQ  D:  10/03/2007  T:  10/03/2007  Job:  [id number]   cc:   Gretta Cool, M.D.  Fax: [phone]   Griffith Citron, M.D.  Fax: [phone]

## 2010-08-30 NOTE — Op Note (Signed)
Susan Gillespie, Susan Gillespie                 ACCOUNT NO.:  [id number]   MEDICAL RECORD NO.:  [id number]          PATIENT TYPE:  AMB   LOCATION:  DSC                          FACILITY:  [facility]   PHYSICIAN:  Dionne Ano. Gramig III, M.D.DATE OF BIRTH:  [birth year]   DATE OF PROCEDURE:  11/12/2007  DATE OF DISCHARGE:                               OPERATIVE REPORT   PREOPERATIVE DIAGNOSES:  1. 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  2. 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  3. Right middle finger A1 pulley stenosing tenosynovitis.   POSTOPERATIVE DIAGNOSES:  1. 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  2. 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  3. Right middle finger A1 pulley stenosing tenosynovitis.   PROCEDURES:  1. Left thumb carpometacarpal arthroplasty (removal of the trapezium      at the basilar thumb joint region secondary to degenerative change,      left thumb).  2. Abductor pollicis longus, one-third proper portion tendon transfer      to the flexor carpi radialis, abductor pollicis longus proper and      back upon themselves with multiple figure-of-8 throws (Weilby      tendon transfer), left basilar thumb joint.  3. Abductor pollicis longus digastric portion tendon transfer to the      first metacarpal flexor carpi radialis and back upon itself      (Zancolli tendon transfer), left basilar thumb joint.  4. Abductor pollicis longus tenodesis (shortening of wrist extensor at      the wrist forearm level).  5. Left ring finger, A1 pulley release with tenosynovectomy of the      flexor digitorum profundus and flexor digitorum superficialis      tendons.  6. Right middle finger A1 pulley injection with one of lidocaine and      one of Celestone.   SURGEON:  Dionne Ano. Amanda Pea, MD   ASSISTANT:  Karie Chimera, PA-C   COMPLICATIONS:  None.   ANESTHESIA:  General with  preoperative block.   TOURNIQUET TIME:  Less than 90 minutes.   ESTIMATED BLOOD LOSS:  Minimal.   INDICATIONS FOR PROCEDURE:  Susan Gillespie is a very pleasant female who presents  with the above-mentioned diagnoses.  I have counseled her in regards to  risks and benefits of surgery including risk of infection, bleeding,  anesthesia, damage to normal structures, and failure of surgery to  accomplish its intended goals of relieving symptoms and restoring  function.  With this mind, she desires to proceed.  All questions have  been encouraged and answered preoperatively.   OPERATIVE PROCEDURE:  The patient was seen by myself and anesthesia,  taken to operative suite, and underwent smooth induction of anesthesia  in the form of a general anesthetic.  Preoperatively, axillary block was  placed, she was counseled, time-out was called, and preoperative  antibiotics were given.  Once this done, the patient then underwent a  smooth induction of prep and drape about the left upper extremity.  Once  this was done, I then isolated the extremity.  Tourniquet was  insufflated to 250 mmHg.  A transverse incision was made at the distal  palm overlying the ring finger A1 pulley.  Dissection was carried down,  neurovascular bundle was protected.  A1 pulley release and  tenosynovectomy of the FDP and FDS tendons were performed.  I irrigated  copiously and closed the wound with Prolene.  Following this, the  patient then had attention turned towards the thumb.  Curvilinear  incision was made.  Dissection was carried down.  APL and EPB tendons  were identified.  Superficial radial nerve branch was identified and  protected.  The radial artery was identified and protected.  The  trapezium was then identified, joint opened.  An H-shaped incision was  created and piecemeal excision of the trapezium was then accomplished.  Following this, I then performed a tenolysis of the FCR tendon and this  completed the  arthroplasty via the trapezium excision.   Following this, I then performed a counterincision at the dorsal distal  third of the wrist.  Dissection was carried down the APL digastric  portion and a one-third proper portion was harvested.  Through this  incision, this was then closed and tendons were retracted distally.  Once this was done, I then performed a complex tendon transfer.  The  abductor pollicis longus digastric portion was taken adjacent to the  first metacarpal through the drill hole dorsal to palmar around the FCU  twice.  It was inset with FiberWire suture and then placed back through  itself and the bony drill hole.  This completed the Zancolli tendon  transfer and was inset with FiberWire.  Following this, the APL one-  third proper portion was taken around the APL proper FCR and with  multiple figure-of-8 throws, was harnessed into place creating a second  tendon transfer.  This was the [facility] tendon transfer and was inset with  FiberWire.  Following this, I placed Gelfoam in the wound and then  performed an APL tenodesis (shortening of wrist extensors, wrist forearm  level to prevent dorsolateral escape).  I then performed a complex  capsular closure with FiberWire.  Following this, I irrigated copiously,  deflated tourniquet, obtaining hemostasis with bipolar electrocautery  and closed the wound with Prolene.  She was placed in thumb spica splint  after dressing was applied.   Following this, the right middle finger was prepped and draped, and A1  pulley injection was accomplished with 1 mL of lidocaine and 1 mL of  Celestone and she tolerated this well.  She was then awoken from  anesthesia and taken to recovery room.  She will be monitored overnight.  We will plan for IV antibiotics and pain management according to her  needs and general postop measures according to our protocol.  It has  been pleasure to see her and we look forward to seeing her in postop  visit  in 10-14 days for suture removal, x-rays, and the cast  application.      Dionne Ano. Amanda Pea, M.D.  Electronically Signed     ______________________________  Dionne Ano. Everlene Other, M.D.    Nash Mantis  D:  11/12/2007  T:  11/13/2007  Job:  [id number]

## 2010-08-30 NOTE — Op Note (Signed)
NAMEGRETE, BOSKO                 ACCOUNT NO.:  [id number]   MEDICAL RECORD NO.:  [id number]          PATIENT TYPE:  INP   LOCATION:  1222                         FACILITY:  [facility]   PHYSICIAN:  Sandria Bales. Ezzard Standing, M.D.  DATE OF BIRTH:  10-15-1945   DATE OF PROCEDURE:  10/03/2007  DATE OF DISCHARGE:                               OPERATIVE REPORT   Date of surgery ??   PREOPERATIVE DIAGNOSIS:  Hematoma, left lower quadrant.   POSTOPERATIVE DIAGNOSIS:  Hematoma, left lower quadrant.   PROCEDURES:  Exploration of hematoma, ligation of superficial vessel in  left lower quadrant over fascia.   SURGEON:  Sandria Bales. Ezzard Standing, MD   ANESTHESIA:  General LMA.   BLOOD LOSS:  Minimal.   PROCEDURE:  Ms. Vanzee is a 65 year old white female who had a sigmoid  colectomy this morning for repeated diverticular disease with history of  abscess.   Her surgery was done laparoscopic assisted, actually mobilized her  splenic flexure laparoscopically into the lower part of her operation  pretty much open.  She did well during the surgery; however, in the  recovery room, she developed an increasing left lower quadrant hematoma  with oozing through the staples.  The patient's pressure was stable and  she was at no time was hypotensive.  However, I was concerned because of  the expanding hematoma.  I discussed with her about taking her back to  the operating room.  Her friend Norva Pavlov, I spoke to him over the phone  about the return to surgery.   OPERATIVE NOTE:  She was placed in a supine position.  Her abdomen was  prepped with Techni-Care.  I took out the staples to the left lower  quadrant, 5-mm trocar and cut down, made about a 3 cm incision and  followed the track down.   In the superficial area, right on top of the fascia, she had a small  artery which was pumping.  I then opened the fascia and got to the  muscle where she had a hematoma, but I saw really no active bleeding.  I  probed this area  with a hemostat.  I irrigated with a liter-half of  saline.  I put a pack and waited of 7 minutes between putting the pack  in and pulling it out.  I saw no further bleeding.   I then closed the anterior fascia with interrupted 2-0 Vicryl sutures,  the skin again with staples.  She tolerated the procedure well, returned  to the recovery room.  We will check blood counts and coags on her in  the recovery room.  Her sponge and needle counts were correct at the end  of case.      Sandria Bales. Ezzard Standing, M.D.  Electronically Signed     DHN/MEDQ  D:  10/03/2007  T:  10/04/2007  Job:  [id number]

## 2010-09-02 NOTE — Discharge Summary (Signed)
NAMEJAQUITA, BESSIRE                 ACCOUNT NO.:  [id number]   MEDICAL RECORD NO.:  [id number]          PATIENT TYPE:  INP   LOCATION:  1527                         [facility]ACILITY:  [facility]   PHYSICIAN:  Sandria Bales. Ezzard Standing, M.D.  DATE OF BIRTH:  Jul 30, 1945   DATE OF ADMISSION:  10/03/2007  DATE OF DISCHARGE:  10/09/2007                               DISCHARGE SUMMARY   Dates of admisson/discharge ??   DISCHARGE DIAGNOSES:  1. Recurrent sigmoid diverticulitis with diverticulosis.  2. Irritable bowel syndrome.  3. Anxiety.  4. Hypercholesterolemia.  5. Reflux.  6. Hypokalemia.  7. Hematoma left lower quadrant pain   OPERATIONS PERFORMED:  1. The patient had a lap assisted sigmoid colectomy, mobilization of      splenic flexure, appendectomy and rigid sigmoidoscopy on October 03, 2007.  2. The patient had exploration of hematoma left lower quadrant on October 03, 2007.   HISTORY OF ILLNESS:  Ms. Clune is a 65 year old white female who is a  retired Engineer, civil (consulting), who is a patient Dr. Esperanza Richters, who she sees from a  medical standpoint and Dr. Ritta Slot from a gastrointestinal  standpoint.   She has had recurrent bouts of diverticular disease with diverticulitis,  and on at least occasion was found to have a diverticular abscess.   She has had increasing number of bouts with diverticular disease, and  one time she had what appeared to a diverticular abscess in May 2008,  and because of these increasingly frequent symptoms with her colon, we  have talked about proceeding with a surgical option for her diverticular  disease.   She has had no significant stomach or liver disease.  She had a  laparoscopic cholecystectomy by Dr. Ovidio Kin in November 2001.  She  has had a hysterectomy in 1987, which was done vaginally.   PAST MEDICAL HISTORY:  1. Most significant; she does have an elevated cholesterol for which      she is on Crestor.  2. She has a history of reflux on Nexium.  3.  She has had some anxiety for which she takes trazodone.  4. She has a history of irritable bowel syndrome, for which she has      used Lotronex at times.   The indications and potential complications of surgery were explained to  the patient.  The patient presented the patient presented to the  hospital on October 03, 2007.  She completed a mechanical and antibiotic  bowel prep the day before surgery, and she underwent a laparoscopic-  assisted sigmoid colectomy with mobilization of splenic flexure.  I did  an appendectomy on did a rigid sigmoidoscopy.   She did well during the surgery; however, in the her recovery room she  developed an increasing swelling mass her left lower quadrant at the 5-  mm trocar site.  There was a concern about the continued bleeding, so I  took her back to the operating room where I evacuated this hematoma, but  really did not find much else of concern.  On the first postoperative day, her hemoglobin was 10, hematocrit 30,  white blood count of 12,000.  Her potassium was 5.0.  I left her NG tube  in at that time.  On the second postoperative day, however, her  potassium had dropped down to 3.3.  Her hemoglobin was 9.7.  Her white  blood count stable at 10,400.  She was given IV potassium as a  supplement.   By the fourth postop day, she is passing small amounts of flatus out her  rectum.  Her NG tube had been removed.  Her vital signs were stable.  She was started on a diet.  By October 09, 2007, she was 6 days postop.  She was tolerating regular food.  She was having bowel movements.  Her  wounds look good.   Dr. Abbey Chatters checked both a factor VIII level and von Willebrand's  factor and both came back as normal.   Her final pathology showed sigmoid colon segment with diverticulitis  with pericolonic abscess.  No evidence of malignancy and her appendix  was unremarkable.   At the time of discharge, she was given a regular diet.  She may shower.  I will  leave her staples in and see me back in about 1 week for staple  removal.  She was given Vicodin for pain.  She is to resume her home medications, and she knew to call for any  interval problems.   Her discharge condition was good.      Sandria Bales. Ezzard Standing, M.D.  Electronically Signed     DHN/MEDQ  D:  11/18/2007  T:  11/18/2007  Job:  [id number]   cc:   Gretta Cool, M.D.  Fax: [phone]   Griffith Citron, M.D.  Fax: [phone]

## 2010-09-02 NOTE — Discharge Summary (Signed)
NAMEPRESLEIGH, Susan Gillespie                 ACCOUNT NO.:  [id number]   MEDICAL RECORD NO.:  [id number]          PATIENT TYPE:  INP   LOCATION:  1537                         [facility]ACILITY:  [facility]   PHYSICIAN:  Lonia Blood, M.D.       DATE OF BIRTH:  March 04, 1946   DATE OF ADMISSION:  09/08/2006  DATE OF DISCHARGE:  09/11/2006                               DISCHARGE SUMMARY   PRIMARY CARE PHYSICIAN:  Dr. Nicholas Lose.   DISCHARGE DIAGNOSES:  1. Acute diverticulitis with intramural abscess.  2. Irritable bowel syndrome.  3. Gastroesophageal reflux disease.  4. Status post cholecystectomy.  5. Status post partial hysterectomy.  6. Hyperlipidemia.   DISCHARGE MEDICATIONS:  1. Celebrex 200 mg daily.  2. Crestor 10 mg daily.  3. Lotronex twice a day.  4. Trazodone 100 mg at bedtime.  5. Lexapro 20 mg daily.  6. Nexium 40 mg daily.  7. Vivelle Dot 0.05 mg twice a week.  8. Flagyl 500 mg three times a day.  9. Cipro 500 mg twice a day.   CONDITION ON DISCHARGE:  Susan Gillespie was discharged in good condition.  At the time of discharge the patient was afebrile and with no abdominal  tenderness.  The patient was instructed to follow up with Dr. Ezzard Standing  from general surgery and with Dr. Kinnie Scales from gastroenterology.  The  patient was also instructed to promptly report back to the emergency  room in case she has abdominal pain.   PROCEDURES DURING THIS ADMISSION:  1. On Sep 08, 2006, the patient underwent an ultrasound of the pelvis      with findings of surgically absent uterus, thick-walled bowel in      the low left pelvis.  2. Computer tomography scan of abdomen and pelvis with findings      compatible with sigmoid diverticulitis with intramural abscess.   CONSULTATIONS DURING THIS ADMISSION:  No consultations were obtained.   HISTORY AND PHYSICAL:  For admission history and physical, refer to  dictated H&P done by Dr. Lavera Guise May 24, 20008.   HOSPITAL COURSE:  1. Diverticulitis.  Susan Gillespie was  admitted to [facility].      She was placed on intravenous fluids and n.p.o.  The patient also      received intravenous antibiotics in the form of ciprofloxacin and      Flagyl.  The patient's clinical status improved significantly over      the course of the next day.  The patient remained afebrile.  Her      white blood cell count has normalized.  The patient was      transitioned to oral antibiotics and then as she continued to      improve was discharged home.  She was instructed to follow up with      Dr. Ezzard Standing from surgery for consideration of elective resection of      the sigmoid colon.  2. Depression, hyperlipidemia, and irritable bowel syndrome.  These      problems have remained stable during this admission on the  patient's chronic medications without any adjustments.      Lonia Blood, M.D.  Electronically Signed     SL/MEDQ  D:  09/15/2006  T:  09/15/2006  Job:  [id number]   cc:   Sandria Bales. Ezzard Standing, M.D.  [address]  [place]  [place] [address]   Gretta Cool, M.D.  Fax: [phone]   Griffith Citron, M.D.  Fax: [phone]

## 2010-09-02 NOTE — Op Note (Signed)
[place]  Patient:    Susan Gillespie, Susan Gillespie                        MRN: [id number] Proc. Date: 03/07/00 Adm. Date:  [id number] Attending:  Andre Lefort CC:         Vale Haven. Andrey Campanile, M.D.  Roosvelt Harps, M.D.   Operative Report  DATE OF BIRTH:  07-17-1945  PREOPERATIVE DIAGNOSIS:  Recurrent abdominal pain with elevated liver function tests suspicious of gallbladder disease.  POSTOPERATIVE DIAGNOSIS:  Chronic cholecystitis.  OPERATION PERFORMED:  Laparoscopic cholecystectomy and intraoperative cholangiogram.  SURGEON:  Sandria Bales. Ezzard Standing, M.D.  ASSISTANT:  Chevis Pretty, M.D.  ANESTHESIA:  General endotracheal.  ESTIMATED BLOOD LOSS:  Minimal.  INDICATIONS FOR PROCEDURE:  Susan Gillespie is a 65 year old female who has had at least three attacks of epigastric abdominal pain with at least once with elevated liver functions.  She has seen Dr. Roosvelt Harps and had an endoscopic retrograde cholangiopancreatography.  She now presents for attempted laparoscopic cholecystectomy.  Discussion carried out of indications and complications with the patient.  DESCRIPTION OF PROCEDURE:  The patient was placed in a supine position.  She had an orogastric tube placed.  Her abdomen was prepped with Betadine solution and sterilely draped.  She had PAS stockings in place and was given 1 gm of Ancef at initiation of procedure.  A 10-12 mm Hasson trocar was inserted through an infraumbilical incision and secured with a 0 Vicryl suture.  A 0 degree 10 mm laparoscope was inserted through the Hasson trocar and abdominal exploration carried out.  The right and left lobes of liver were unremarkable.  Anterior wall of the stomach ws unremarkable.  The lower abdomen was really unremarkable for any mass, lesion or nodularity.  Three additional trocars were placed, a 10 mm Ethicon trocar in the subxiphoid location, a 5 mm Ethicon trocar in the right  midsubcostal location and 5 mm Ethicon trocar in the right lateral subcostal location.  The gallbladder was then grasped, rotated cephalad.  Actually the gallbladder was noted to have a fair amount of adhesions between the duodenum and the gallbladder going up about two thirds of the wall.  These were taken down with sharp and blunt dissection.  I got down to the gallbladder and cystic duct junction and identified the cystic duct.  The patient had no single discrete cystic artery but had at least three or four small branches that kind of traveled through the triangle of Calot and these were clipped and divided.  I then identified the cystic duct.  I used a cut-off taut catheter inserted through a 14 gauge Jelco to do a cholangiogram.  The taut catheter was inserted to the cystic duct and a clip placed to hold the catheter.  This showed free flow of contrast down the cystic duct which was fairly long common bile duct into the duodenum.  There was no obstruction, no mass, no filling defect.  There was some leakage ____________ at where the catheter had been placed into the cystic duct.  ____________ a normal intraoperative cholangiogram. The taut catheter was removed.  The cystic duct triply endoclipped and divided.  The gallbladder was then sharply and bluntly dissected from the gallbladder bed.  There was no ____________ prior to complete division of the gallbladder from the gallbladder bed, I reinspected the gallbladder bed saw no bleeding or bile leak.  Looked in the triangle of Calot  butthere was no bleeding or bile leak.  I then irrigated out the abdomen, divided the gallbladder and delivered it through the umbilicus and sent it to pathology.  The abdomen was then irrigated out.  Each trocar was removed under direct visualization.  There was no bleeding at any trocar site.  The umbilical trocar was closed with a 0 Vicryl suture.  The ____________ site was closed with a 5-0 Vicryl  suture, painted with tincture of benzoin, steri-stripped and sterilely dressed.  The patient tolerated the procedure well, was transported to the recovery room in good condition.  The sponge and needle counts were correct at the end of the case. DD:  03/07/00 TD:  03/07/00 Job: [id number]

## 2010-09-02 NOTE — Procedures (Signed)
[facility]  Patient:    Susan Gillespie, Susan Gillespie                        MRN: [id number] Proc. Date: 01/18/00 Adm. Date:  [id number] Attending:  Mingo Amber.                           Procedure Report  PROCEDURE:  Endoscopic retrograde cholangiopancreatography.  PHYSICIAN:  Roosvelt Harps, M.D.  INDICATIONS:  A 65 year old female with epigastric pain radiating into her back that sound biliary associated with fluctuating and abnormal liver function tests.  Sonogram demonstrates a 10 mm common bile duct without any stones seen in the gallbladder.  PREPARATION:  The patient is n.p.o. since midnight.  PREPROCEDURE SEDATION:  She received 100 mg of Demerol and 9 mg of Versed intravenously.  In addition, she received 1.25 mg of Glucagon IV, and her throat was anesthetized with Hurricaine spray.  PROCEDURE:  The Olympus video duodenoscope was inserted via the mouth and advanced easily through the upper esophageal sphincter.  Intubation was then carried out into the descending duodenum.  The scope was withdrawn to visualize the ampulla.  The ampulla was located within a rather large periampullary diverticulum, but despite this anatomy, the common bile duct was easily cannulated with the Tritome catheter.  However, despite initial injections of dye into the common bile duct, the guidewire could not be passed despite maneuvers with rotation, straightening the scope, and bowing the catheter.  A small submucosal injection was made.  Dr. Liliane Shi, the radiologist was called into the fluoroscopy suite, and he reviewed the common bile duct fluoroscopically and felt that there was no evidence for any common bile duct stone.  Accordingly, because of her difficult anatomy, no therapeutic maneuvers were attempted since the guidewire could not be passed.  The procedure was terminated.  The patient also received 1 g of cefoxitin IV.  She will be observed in recovery for at  least one hour, and consideration will be given to admission if she has any excess pain.  Otherwise, she will be discharged for outpatient followup in my office within a week. DD:  01/18/00 TD:  01/19/00 Job: [id number]

## 2010-09-13 ENCOUNTER — Encounter: Payer: Self-pay | Admitting: Internal Medicine

## 2010-09-14 ENCOUNTER — Encounter: Payer: Self-pay | Admitting: Internal Medicine

## 2010-09-14 ENCOUNTER — Ambulatory Visit (INDEPENDENT_AMBULATORY_CARE_PROVIDER_SITE_OTHER): Payer: Medicare Other | Admitting: Internal Medicine

## 2010-09-14 DIAGNOSIS — I10 Essential (primary) hypertension: Secondary | ICD-10-CM

## 2010-09-14 DIAGNOSIS — E785 Hyperlipidemia, unspecified: Secondary | ICD-10-CM

## 2010-09-14 DIAGNOSIS — E119 Type 2 diabetes mellitus without complications: Secondary | ICD-10-CM

## 2010-09-14 NOTE — Patient Instructions (Signed)
Diabetes, Type 2 Diabetes is a lasting (chronic) disease. In type 2 diabetes, the pancreas does not make enough insulin (a hormone), and the body does not respond normally to the insulin that is made. This type of diabetes was also previously called adult onset diabetes. About 90% of all those who have diabetes have type 2. It usually occurs after the age of 40 but can occur at any age. CAUSES Unlike type 1 diabetes, which happens because insulin is no longer being made, type 2 diabetes happens because the body is making less insulin and has trouble using the insulin properly. SYMPTOMS  Drinking more than usual.   Urinating more than usual.   Blurred vision.   Dry, itchy skin.   Frequent infection like yeast infections in women.   More tired than usual (fatigue).  TREATMENT  Healthy eating.   Exercise.   Medication, if needed.   Monitoring blood glucose (sugar).   Seeing your caregiver regularly.  HOME CARE INSTRUCTIONS  Check your blood glucose (sugar) at least once daily. More frequent monitoring may be necessary, depending on your medications and on how well your diabetes is controlled. Your caregiver will advise you.   Take your medicine as directed by your caregiver.   Do not smoke.   Make wise food choices. Ask your caregiver for information. Weight loss can improve your diabetes.   Learn about low blood glucose (hypoglycemia) and how to treat it.   Get your eyes checked regularly.   Have a yearly physical exam. Have your blood pressure checked. Get your blood and urine tested.   Wear a pendant or bracelet saying that you have diabetes.   Check your feet every night for sores. Let your caregiver know if you have sores that are not healing.  SEEK MEDICAL CARE IF:  You are having problems keeping your blood glucose at target range.   You feel you might be having problems with your medicines.   You have symptoms of an illness that is not improving after 24  hours.   You have a sore or wound that is not healing.   You notice a change in vision or a new problem with your vision.   You develop a fever of more than 100.5.  Document Released: 04/03/2005 Document Re-Released: 04/25/2009 [employer] Patient Information 2011 [employer]. 

## 2010-09-14 NOTE — Assessment & Plan Note (Signed)
I have asked her to stop taking benicar since it caused her SBP to go down to 80-90 and she felt dizzy and lightheaded.

## 2010-09-14 NOTE — Assessment & Plan Note (Signed)
Her blood sugar is well controlled 

## 2010-09-14 NOTE — Progress Notes (Signed)
Subjective:    Patient ID: Susan Gillespie, female    DOB: 05-01-1945, 65 y.o.   MRN: [id number]  Hypertension This is a chronic problem. The current episode started more than 1 year ago. The problem has been gradually improving since onset. The problem is controlled. Pertinent negatives include no anxiety, blurred vision, chest pain, headaches, malaise/fatigue, neck pain, orthopnea, palpitations, peripheral edema, PND, shortness of breath or sweats. There are no associated agents to hypertension. Past treatments include angiotensin blockers. The current treatment provides significant improvement. Compliance problems include medication side effects.       Review of Systems  Constitutional: Negative for fever, chills, malaise/fatigue, diaphoresis, activity change, appetite change, fatigue and unexpected weight change.  HENT: Negative for trouble swallowing, neck pain and voice change.   Eyes: Negative for blurred vision, photophobia and visual disturbance.  Respiratory: Negative for cough, shortness of breath, wheezing and stridor.   Cardiovascular: Negative for chest pain, palpitations, orthopnea, leg swelling and PND.  Gastrointestinal: Negative for nausea, vomiting, abdominal pain and diarrhea.  Genitourinary: Negative for dysuria, urgency, frequency, hematuria, flank pain, difficulty urinating and dyspareunia.  Musculoskeletal: Negative for myalgias, back pain, joint swelling, arthralgias and gait problem.  Skin: Negative for color change, pallor and rash.  Neurological: Positive for dizziness and light-headedness. Negative for tremors, seizures, syncope, facial asymmetry, speech difficulty, weakness, numbness and headaches.  Hematological: Negative for adenopathy. Does not bruise/bleed easily.  Psychiatric/Behavioral: Negative.        Objective:   Physical Exam  Vitals reviewed. Constitutional: She is oriented to person, place, and time. She appears well-developed and well-nourished.  No distress.  HENT:  Head: Normocephalic and atraumatic.  Right Ear: External ear normal.  Left Ear: External ear normal.  Nose: Nose normal.  Mouth/Throat: Oropharynx is clear and moist. No oropharyngeal exudate.  Eyes: Conjunctivae and EOM are normal. Pupils are equal, round, and reactive to light. Right eye exhibits no discharge. Left eye exhibits no discharge. No scleral icterus.  Neck: Normal range of motion. Neck supple. No JVD present. No tracheal deviation present. No thyromegaly present.  Cardiovascular: Normal rate, regular rhythm, normal heart sounds and intact distal pulses.  Exam reveals no gallop and no friction rub.   No murmur heard. Pulmonary/Chest: Effort normal and breath sounds normal. No stridor. No respiratory distress. She has no wheezes. She has no rales. She exhibits no tenderness.  Abdominal: Soft. Bowel sounds are normal. She exhibits no distension and no mass. There is no tenderness. There is no rebound and no guarding.  Musculoskeletal: Normal range of motion. She exhibits no edema and no tenderness.  Lymphadenopathy:    She has no cervical adenopathy.  Neurological: She is alert and oriented to person, place, and time. She has normal reflexes. She displays normal reflexes. No cranial nerve deficit. She exhibits normal muscle tone. Coordination normal.  Skin: Skin is warm and dry. No rash noted. She is not diaphoretic. No erythema. No pallor.  Psychiatric: She has a normal mood and affect. Her behavior is normal. Judgment and thought content normal.       Lab Results  Component Value Date   WBC 5.4 08/22/2010   HGB 13.3 08/22/2010   HCT 37.9 08/22/2010   PLT 220.0 08/22/2010   CHOL 197 08/22/2010   TRIG 106.0 08/22/2010   HDL 103.40 08/22/2010   ALT 20 04/28/2009   AST 23 04/28/2009   NA 136 04/28/2009   K 4.6 04/28/2009   CL 101 04/28/2009   CREATININE 1.2 04/28/2009  BUN 12 04/28/2009   CO2 29 04/28/2009   TSH 0.86 08/22/2010   HGBA1C 6.4 08/22/2010     Assessment &  Plan:

## 2010-09-14 NOTE — Assessment & Plan Note (Signed)
She is doing well on crestor 

## 2010-09-15 ENCOUNTER — Encounter: Payer: Self-pay | Admitting: Internal Medicine

## 2010-09-15 ENCOUNTER — Other Ambulatory Visit (INDEPENDENT_AMBULATORY_CARE_PROVIDER_SITE_OTHER): Payer: Medicare Other

## 2010-09-15 DIAGNOSIS — E785 Hyperlipidemia, unspecified: Secondary | ICD-10-CM

## 2010-09-15 DIAGNOSIS — I10 Essential (primary) hypertension: Secondary | ICD-10-CM

## 2010-09-15 LAB — COMPREHENSIVE METABOLIC PANEL
ALT: 23 U/L (ref 0–35)
Alkaline Phosphatase: 45 U/L (ref 39–117)
Creatinine, Ser: 1 mg/dL (ref 0.4–1.2)
Sodium: 137 mEq/L (ref 135–145)
Total Bilirubin: 0.5 mg/dL (ref 0.3–1.2)
Total Protein: 6 g/dL (ref 6.0–8.3)

## 2010-12-02 ENCOUNTER — Telehealth: Payer: Self-pay | Admitting: Cardiology

## 2010-12-02 ENCOUNTER — Ambulatory Visit (INDEPENDENT_AMBULATORY_CARE_PROVIDER_SITE_OTHER): Payer: Medicare Other | Admitting: Cardiology

## 2010-12-02 ENCOUNTER — Ambulatory Visit
Admission: RE | Admit: 2010-12-02 | Discharge: 2010-12-02 | Disposition: A | Discharge status: Home / Self Care | Payer: Medicare Other | Source: Ambulatory Visit | Attending: Cardiology | Admitting: Cardiology

## 2010-12-02 ENCOUNTER — Encounter: Payer: Self-pay | Admitting: Cardiology

## 2010-12-02 VITALS — BP 170/85 | HR 68 | Wt 165.0 lb

## 2010-12-02 DIAGNOSIS — I119 Hypertensive heart disease without heart failure: Secondary | ICD-10-CM

## 2010-12-02 MED ORDER — HYDROCHLOROTHIAZIDE 12.5 MG PO CAPS: 12.5000 mg | ORAL_CAPSULE | Freq: Every day | ORAL | Status: DC

## 2010-12-02 NOTE — Telephone Encounter (Signed)
At check out Dr. Patty Sermons would like pt to return in about 1 month (around 01/02/2011) for OV BMET, attempted to schedule however next appt is November 2nd, please call pt back at home next week to schedule appt sooner

## 2010-12-02 NOTE — Progress Notes (Signed)
Susan Gillespie Date of Birth:  [birth year] [facility] / [facility] [address]   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  She comes in because of concern over paroxysmal high blood pressure readings generally her blood pressure is normal.  2 years ago she had an episode of significant hypertension which lasted a day or 2.  She had no further problems until this year when vacationing in [place] she began having a feeling of doom and checked her blood pressure and it was 180/80.  She does not have any history of chest pain or angina.  He does not have any diaphoresis associated with the high blood pressure.  She saw her internist to start her on Benicar 40 mg daily but the patient had subsequent profound hypotension while at work and felt near syncopal and stopped taking the Benicar.  At the present time she is on no blood pressure medication.  Current Outpatient Prescriptions  Medication Sig Dispense Refill  . alosetron (LOTRONEX) 1 MG tablet Take 1 mg by mouth 2 (two) times daily. Taking 1/2 bid      . ALPRAZolam (XANAX) 0.5 MG tablet as needed.        Marland Kitchen CALCIUM CITRATE PO Take 1 tablet by mouth 2 (two) times daily.        . celecoxib (CELEBREX) 200 MG capsule Take 200 mg by mouth daily.        . cetirizine (ZYRTEC) 10 MG tablet Take 10 mg by mouth daily.        . Cholecalciferol (VITAMIN D3) 2000 UNITS TABS Take 1 tablet by mouth daily. Taking 10,000 daily      . esomeprazole (NEXIUM) 40 MG capsule Take 40 mg by mouth daily before breakfast.        . estradiol (VIVELLE-DOT) 0.05 MG/24HR Place 1 patch onto the skin 2 (two) times a week. Taking 0.0375 daily      . FOLIC ACID PO Take by mouth.        Marland Kitchen MAGNESIUM OXIDE PO Take 1 tablet by mouth 2 (two) times daily.       . Omega-3 Fatty Acids (FISH OIL) 1000 MG CAPS Take 2 capsules by  mouth daily.        . Probiotic Product (ALIGN PO) Take 1 capsule by mouth.        . rosuvastatin (CRESTOR) 10 MG tablet Take 10 mg by mouth daily.        . traZODone (DESYREL) 100 MG tablet Take 100 mg by mouth at bedtime.        . hydrochlorothiazide (,MICROZIDE/HYDRODIURIL,) 12.5 MG capsule Take 1 capsule (12.5 mg total) by mouth daily.  30 capsule  11    Allergies  Allergen Reactions  . Oxaprozin   . Simvastatin     REACTION: myalgias and fatigue    Patient Active Problem List  Diagnoses  . DIABETES MELLITUS, TYPE II  . HYPERLIPIDEMIA  . GERD  . OSTEOARTHRITIS  . HEADACHE  . C-REACTIVE PROTEIN, SERUM, ELEVATED  . COLONIC POLYPS, HX OF  . DIVERTICULITIS, HX OF  . Essential hypertension, benign    History  Smoking status  . Former Smoker  Smokeless tobacco  . Not on file    History  Alcohol Use  . Yes    Family History  Problem Relation Age of Onset  . Hyperlipidemia Other   . Hypertension Other   . Cancer Other     lung, esophagus, stomach  . Stroke Other     Review of Systems: Constitutional: no fever chills diaphoresis or fatigue or change in weight.  Head and neck: no hearing loss, no epistaxis, no photophobia or visual disturbance. Respiratory: No cough, shortness of breath or wheezing. Cardiovascular: No chest pain peripheral edema, palpitations. Gastrointestinal: No abdominal distention, no abdominal pain, no change in bowel habits hematochezia or melena. Genitourinary: No dysuria, no frequency, no urgency, no nocturia. Musculoskeletal:No arthralgias, no back pain, no gait disturbance or myalgias. Neurological: No dizziness, no headaches, no numbness, no seizures, no syncope, no weakness, no tremors. Hematologic: No lymphadenopathy, no easy bruising. Psychiatric: No confusion, no hallucinations, no sleep disturbance.    Physical Exam: Filed Vitals:   12/02/10 1555  BP: 170/85  Pulse: 68  The general appearance feels a well-developed  well-nourished woman in no distress.The head and neck exam reveals pupils equal and reactive.  Extraocular movements are full.  There is no scleral icterus.  The mouth and pharynx are normal.  The neck is supple.  The carotids reveal no bruits.  The jugular venous pressure is normal.  The  thyroid is not enlarged.  There is no lymphadenopathy.  The chest is clear to percussion and auscultation.  There are no rales or rhonchi.  Expansion of the chest is symmetrical.  The precordium is quiet.  The first heart sound is normal.  The second heart sound is physiologically split.  There is no murmur gallop rub or click.  There is no abnormal lift or heave.  The abdomen is soft and nontender.  The bowel sounds are normal.  The liver and spleen are not enlarged.  There are no abdominal masses.  There are no abdominal bruits.  Extremities reveal good pedal pulses.  There is no phlebitis or edema.  There is no cyanosis or clubbing.  Strength is normal and symmetrical in all extremities.  There is no lateralizing weakness.  There are no sensory deficits.  The skin is warm and dry.  There is no rash.     Assessment / Plan: At this point we are going to add low-dose hydrochlorothiazide 12.5 mg one daily.  She will return in one month for followup office visit and a basal metabolic panel.  We are checking a chest x-ray today.  If she continues to have paroxysmal hypertension we will work her up for a pheochromocytoma although at this point I do not have a high level of suspicion that she has one

## 2010-12-02 NOTE — Assessment & Plan Note (Signed)
As noted the patient has had intermittent high blood pressure readings.  At times these are asymptomatic and at times she feels dizziness.  She is also had some scotomata without headache at times.  She does not think that she use excessive amount of salt.  She had an excessive hypotensive response to a recent trial of Benicar 40 mg.  She had a recent electrocardiogram which was reviewed and was negative.  She has not had a recent chest x-ray in her mother had lung cancer.

## 2010-12-05 ENCOUNTER — Telehealth: Payer: Self-pay | Admitting: *Deleted

## 2010-12-05 NOTE — Telephone Encounter (Signed)
Advised CXR normal 

## 2010-12-05 NOTE — Telephone Encounter (Signed)
Message copied by Eugenia Pancoast on Mon Dec 05, 2010 11:26 AM ------      Message from: Cassell Clement      Created: Sat Dec 03, 2010  8:26 PM       Xray is normal.  Heart upper normal in size.

## 2010-12-05 NOTE — Telephone Encounter (Signed)
Scheduled appointment

## 2011-01-03 ENCOUNTER — Ambulatory Visit (INDEPENDENT_AMBULATORY_CARE_PROVIDER_SITE_OTHER): Payer: Medicare Other | Admitting: Cardiology

## 2011-01-03 ENCOUNTER — Other Ambulatory Visit (INDEPENDENT_AMBULATORY_CARE_PROVIDER_SITE_OTHER): Payer: Medicare Other | Admitting: *Deleted

## 2011-01-03 VITALS — BP 146/80 | HR 72 | Wt 162.0 lb

## 2011-01-03 DIAGNOSIS — R51 Headache: Secondary | ICD-10-CM

## 2011-01-03 DIAGNOSIS — I119 Hypertensive heart disease without heart failure: Secondary | ICD-10-CM

## 2011-01-03 DIAGNOSIS — I10 Essential (primary) hypertension: Secondary | ICD-10-CM

## 2011-01-03 LAB — BASIC METABOLIC PANEL
Calcium: 9.5 mg/dL (ref 8.4–10.5)
GFR: 77.22 mL/min (ref >60.00)
Glucose, Bld: 110 mg/dL — ABNORMAL HIGH (ref 70–99)
Sodium: 136 mEq/L (ref 135–145)

## 2011-01-03 NOTE — Progress Notes (Signed)
Susan Gillespie Date of Birth:  11-10-1945 [facility] Cardiology / [facility] [address]   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  She has been keeping a record of her blood pressures which are quite variable.  When we saw her we added hydrochlorothiazide 12.5 mg to her regimen.  Previously another physician had started her on Benicar 40 mg daily which developed profound Hypotension and she was unable to tolerate it.  Patient has not been expressing any chest pain.  She does have a family history of coronary disease with a 29 year old brother recently having a massive heart attack treated with emergency angioplasty successfully and she is a 39 year old sister who is also had a heart attack.  The patient herself has had no chest pressure but has had symptoms of GERD .  Current Outpatient Prescriptions  Medication Sig Dispense Refill  . alosetron (LOTRONEX) 1 MG tablet Take 1 mg by mouth 2 (two) times daily. Taking 1/2 bid      . ALPRAZolam (XANAX) 0.5 MG tablet as needed.        Marland Kitchen CALCIUM CITRATE PO Take 1 tablet by mouth 2 (two) times daily.        . celecoxib (CELEBREX) 200 MG capsule Take 200 mg by mouth daily.        . cetirizine (ZYRTEC) 10 MG tablet Take 10 mg by mouth daily.        . Cholecalciferol (VITAMIN D3) 2000 UNITS TABS Take 1 tablet by mouth daily. Taking 10,000 daily      . esomeprazole (NEXIUM) 40 MG capsule Take 40 mg by mouth daily before breakfast.        . estradiol (VIVELLE-DOT) 0.05 MG/24HR Place 1 patch onto the skin 2 (two) times a week. Taking 0.0375 daily      . FOLIC ACID PO Take by mouth.        . hydrochlorothiazide (,MICROZIDE/HYDRODIURIL,) 12.5 MG capsule Take 1 capsule (12.5 mg total) by mouth daily.  30 capsule  11  . MAGNESIUM OXIDE PO  Take 1 tablet by mouth 2 (two) times daily.       . Omega-3 Fatty Acids (FISH OIL) 1000 MG CAPS Take 2 capsules by mouth daily.        . Probiotic Product (ALIGN PO) Take 1 capsule by mouth.        . rosuvastatin (CRESTOR) 10 MG tablet Take 10 mg by mouth daily.        . traZODone (DESYREL) 100 MG tablet Take 100 mg by mouth at bedtime.          Allergies  Allergen Reactions  . Oxaprozin   . Simvastatin     REACTION: myalgias and fatigue    Patient Active Problem List  Diagnoses  . DIABETES MELLITUS, TYPE II  . HYPERLIPIDEMIA  . GERD  . OSTEOARTHRITIS  . HEADACHE  . C-REACTIVE PROTEIN, SERUM, ELEVATED  . COLONIC POLYPS, HX OF  . DIVERTICULITIS, HX OF  . Essential hypertension, benign  . Benign hypertensive heart disease without heart failure    History  Smoking status  . Former Smoker  Smokeless tobacco  . Not on file    History  Alcohol Use  . Yes  Family History  Problem Relation Age of Onset  . Hyperlipidemia Other   . Hypertension Other   . Cancer Other     lung, esophagus, stomach  . Stroke Other     Review of Systems: The patient denies any heat or cold intolerance.  No weight gain or weight loss.  The patient denies headaches or blurry vision.  There is no cough or sputum production.  The patient denies dizziness.  There is no hematuria or hematochezia.  The patient denies any muscle aches or arthritis.  The patient denies any rash.  The patient denies frequent falling or instability.  There is no history of depression or anxiety.  All other systems were reviewed and are negative.   Physical Exam: Filed Vitals:   01/03/11 2150  BP: 146/80  Pulse: 72  The general appearance reveals a well-developed well-nourished woman in no distress.The head and neck exam reveals pupils equal and reactive.  Extraocular movements are full.  There is no scleral icterus.  The mouth and pharynx are normal.  The neck is supple.  The carotids reveal no bruits.  The jugular  venous pressure is normal.  The  thyroid is not enlarged.  There is no lymphadenopathy.  The chest is clear to percussion and auscultation.  There are no rales or rhonchi.  Expansion of the chest is symmetrical.  The precordium is quiet.  The first heart sound is normal.  The second heart sound is physiologically split.  There is no murmur gallop rub or click.  There is no abnormal lift or heave.  The abdomen is soft and nontender.  The bowel sounds are normal.  The liver and spleen are not enlarged.  There are no abdominal masses.  There are no abdominal bruits.  Extremities reveal good pedal pulses.  There is no phlebitis or edema.  There is no cyanosis or clubbing.  Strength is normal and symmetrical in all extremities.  There is no lateralizing weakness.  There are no sensory deficits.  The skin is warm and dry.  There is no rash.      Assessment / Plan: Add lisinopril 5 mg daily.  Obtain 24-hour urine for metanephrine.  Recheck in 2 months

## 2011-01-03 NOTE — Assessment & Plan Note (Signed)
The patient is no longer having headaches.  Her blood pressure is still elevated however and we are going to add lisinopril 5 mg one daily to her regimen.  She will continue hydrochlorothiazide as well

## 2011-01-03 NOTE — Assessment & Plan Note (Signed)
The patient's her blood pressure is better but is still not down to goal.  The patient brought in her blood pressure readings which show striking day to day variations.  She is concerned about pheochromocytoma and we will arrange for a 24-hour urine for metanephrine.

## 2011-01-04 ENCOUNTER — Telehealth: Payer: Self-pay | Admitting: Cardiology

## 2011-01-04 NOTE — Telephone Encounter (Signed)
It was actually a 2 MTH OV with Dr. Patty Sermons.

## 2011-01-04 NOTE — Telephone Encounter (Signed)
Scheduled appointment with Lawson Fiscal in 1 month

## 2011-01-04 NOTE — Telephone Encounter (Signed)
Came in today to schedule her one month appointment with Dr. Patty Sermons. Please call back.

## 2011-01-05 ENCOUNTER — Telehealth: Payer: Self-pay | Admitting: Cardiology

## 2011-01-05 NOTE — Telephone Encounter (Signed)
Left message

## 2011-01-05 NOTE — Telephone Encounter (Signed)
Pt returning call from yesterday. Please call back.  

## 2011-01-06 ENCOUNTER — Telehealth: Payer: Self-pay | Admitting: *Deleted

## 2011-01-06 NOTE — Telephone Encounter (Signed)
Advised of labs 

## 2011-01-06 NOTE — Telephone Encounter (Signed)
Pt returning call to [facility], please call back.

## 2011-01-06 NOTE — Progress Notes (Signed)
Advised of labs 

## 2011-01-06 NOTE — Progress Notes (Signed)
Left message

## 2011-01-06 NOTE — Telephone Encounter (Signed)
Message copied by Burnell Blanks on Fri Jan 06, 2011  2:01 PM ------      Message from: Cassell Clement      Created: Wed Jan 04, 2011  1:58 PM       Electrolytes are stable.  The kidney function is normal.The blood sugar was slightly elevated at 110.  Watch sweets.  Continue present medication

## 2011-01-06 NOTE — Telephone Encounter (Signed)
Advised of lab 

## 2011-01-12 ENCOUNTER — Other Ambulatory Visit: Payer: Self-pay | Admitting: Cardiology

## 2011-01-12 LAB — CBC
HCT: 30 — ABNORMAL LOW
HCT: 39.4
Hemoglobin: 13.4
MCHC: 33.3
MCHC: 33.8
MCHC: 34.1
MCV: 96.1
MCV: 97.4
MCV: 97.5
Platelets: 189
Platelets: 221
Platelets: 230
RBC: 3.18 — ABNORMAL LOW
RBC: 4.1
RDW: 13.8
RDW: 13.8
RDW: 14
WBC: 10.4
WBC: 12 — ABNORMAL HIGH
WBC: 4.2
WBC: 8

## 2011-01-12 LAB — COMPREHENSIVE METABOLIC PANEL
ALT: 84 — ABNORMAL HIGH
AST: 33
Albumin: 3.9
Alkaline Phosphatase: 80
BUN: 13
CO2: 29
Calcium: 9.7
Chloride: 101
Creatinine, Ser: 0.84
GFR calc Af Amer: >60
GFR calc non Af Amer: >60
Glucose, Bld: 116 — ABNORMAL HIGH
Potassium: 4.5
Sodium: 138
Total Bilirubin: 0.7
Total Protein: 6.5

## 2011-01-12 LAB — PROTIME-INR
INR: 0.9
Prothrombin Time: 12.6

## 2011-01-12 LAB — DIFFERENTIAL
Basophils Absolute: 0
Basophils Relative: 1
Eosinophils Absolute: 0.1
Eosinophils Relative: 3
Lymphocytes Relative: 28
Lymphs Abs: 1.2
Monocytes Absolute: 0.4
Monocytes Relative: 9
Neutro Abs: 2.5
Neutrophils Relative %: 60

## 2011-01-12 LAB — BASIC METABOLIC PANEL
BUN: 5 — ABNORMAL LOW
BUN: 7
Calcium: 8.9
Chloride: 104
Chloride: 104
Chloride: 105
Creatinine, Ser: 0.66
Creatinine, Ser: 0.71
GFR calc Af Amer: >60
GFR calc non Af Amer: >60
Glucose, Bld: 194 — ABNORMAL HIGH
Potassium: 5

## 2011-01-12 LAB — FACTOR 8 ASSAY: Coagulation Factor VIII: 177 — ABNORMAL HIGH

## 2011-01-12 LAB — VON WILLEBRAND ANTIGEN: Von Willebrand Factor Ag: 208 (ref 52–214)

## 2011-01-16 LAB — METANEPHRINES, URINE, 24 HOUR
Metaneph Total, Ur: 408 mcg/24 h (ref 224–832)
Metanephrines, Ur: 71 mcg/24 h — ABNORMAL LOW (ref 90–315)
Normetanephrine, 24H Ur: 337 mcg/24 h (ref 122–676)

## 2011-01-19 ENCOUNTER — Telehealth: Payer: Self-pay | Admitting: Cardiology

## 2011-01-19 NOTE — Telephone Encounter (Signed)
Pt left 24 hr urine before the move.  She has gotten no results, please call and advise.

## 2011-01-19 NOTE — Telephone Encounter (Signed)
Left message

## 2011-01-20 ENCOUNTER — Telehealth: Payer: Self-pay | Admitting: *Deleted

## 2011-01-20 NOTE — Progress Notes (Signed)
Advised of results

## 2011-01-20 NOTE — Telephone Encounter (Signed)
Returning call back to nurse.  

## 2011-01-20 NOTE — Telephone Encounter (Signed)
Message copied by Burnell Blanks on Fri Jan 20, 2011 10:17 AM ------      Message from: Cassell Clement      Created: Mon Jan 16, 2011  4:03 PM       Please report.  The metanephrines were normal.  There is no sign of a pheochromocytoma

## 2011-01-20 NOTE — Telephone Encounter (Signed)
Message copied by Burnell Blanks on Fri Jan 20, 2011 10:40 AM ------      Message from: Cassell Clement      Created: Mon Jan 16, 2011  4:03 PM       Please report.  The metanephrines were normal.  There is no sign of a pheochromocytoma

## 2011-01-20 NOTE — Telephone Encounter (Signed)
Advised of results

## 2011-01-31 ENCOUNTER — Ambulatory Visit (INDEPENDENT_AMBULATORY_CARE_PROVIDER_SITE_OTHER): Payer: Medicare Other | Admitting: Nurse Practitioner

## 2011-01-31 ENCOUNTER — Encounter: Payer: Self-pay | Admitting: Nurse Practitioner

## 2011-01-31 VITALS — BP 132/80 | HR 68 | Ht 63.5 in | Wt 162.0 lb

## 2011-01-31 DIAGNOSIS — I10 Essential (primary) hypertension: Secondary | ICD-10-CM

## 2011-01-31 LAB — BASIC METABOLIC PANEL
BUN: 16 mg/dL (ref 6–23)
CO2: 30 mEq/L (ref 19–32)
Calcium: 9.8 mg/dL (ref 8.4–10.5)
Chloride: 101 mEq/L (ref 96–112)
Creatinine, Ser: 0.8 mg/dL (ref 0.4–1.2)
GFR: 73.95 mL/min (ref >60.00)
Glucose, Bld: 106 mg/dL — ABNORMAL HIGH (ref 70–99)
Potassium: 4.1 mEq/L (ref 3.5–5.1)
Sodium: 138 mEq/L (ref 135–145)

## 2011-01-31 NOTE — Patient Instructions (Addendum)
Continue with your current medicines. Monitor your blood pressure at home.  Record your readings and bring to your next visit. Limit sodium intake. Call for any problems.  We will see you back in 4 months.

## 2011-01-31 NOTE — Progress Notes (Signed)
Susan Gillespie Date of Birth: 1945-07-28 Medical Record #[id number]  History of Present Illness: Susan Gillespie is seen back today for a one month check. She is seen for Dr. Patty Sermons. She is doing well. She is tolerating the low dose Lisinopril. Blood pressure has come down nicely at home. No lability in her readings. She feels good. She has had a little dry cough but does not want to switch. No chest pain. She continues to work part time. She is trying to stay active. She is mostly limited by her IBS. 24 hour urine was negative for metanephrines.  Current Outpatient Prescriptions on File Prior to Visit  Medication Sig Dispense Refill  . alosetron (LOTRONEX) 1 MG tablet Take 1 mg by mouth 2 (two) times daily. Taking 1/2 bid      . ALPRAZolam (XANAX) 0.5 MG tablet as needed.        Marland Kitchen CALCIUM CITRATE PO Take 1 tablet by mouth 2 (two) times daily.       . celecoxib (CELEBREX) 200 MG capsule Take 200 mg by mouth daily.        . cetirizine (ZYRTEC) 10 MG tablet Take 10 mg by mouth daily.        . Cholecalciferol (VITAMIN D3) 2000 UNITS TABS Take 1 tablet by mouth daily. Taking 10,000 daily      . esomeprazole (NEXIUM) 40 MG capsule Take 40 mg by mouth daily before breakfast.        . estradiol (VIVELLE-DOT) 0.05 MG/24HR Place 1 patch onto the skin 2 (two) times a week. Taking 0.0375 daily      . FOLIC ACID PO Take by mouth.        . hydrochlorothiazide (,MICROZIDE/HYDRODIURIL,) 12.5 MG capsule Take 1 capsule (12.5 mg total) by mouth daily.  30 capsule  11  . MAGNESIUM OXIDE PO Take 1 tablet by mouth 2 (two) times daily.       . Omega-3 Fatty Acids (FISH OIL) 1000 MG CAPS Take 2 capsules by mouth daily.        . rosuvastatin (CRESTOR) 10 MG tablet Take 10 mg by mouth daily.        . traZODone (DESYREL) 100 MG tablet Take 100 mg by mouth at bedtime.          Allergies  Allergen Reactions  . Oxaprozin   . Simvastatin     REACTION: myalgias and fatigue    Past Medical History  Diagnosis Date  .  Colon polyp   . Diverticulitis   . GERD (gastroesophageal reflux disease)   . Headache   . Hyperlipidemia   . Arthritis   . Diabetes mellitus     Type II  . Family history of cardiovascular disease   . HTN (hypertension)   . IBS (irritable bowel syndrome)     followed by Dr. Kinnie Scales    Past Surgical History  Procedure Date  . Appendectomy   . Spine surgery     cervical fusion  . Cholecystectomy   . Colon surgery   . Abdominal hysterectomy   . Tonsillectomy     History  Smoking status  . Former Smoker  Smokeless tobacco  . Not on file    History  Alcohol Use  . Yes    Family History  Problem Relation Age of Onset  . Hyperlipidemia Other   . Hypertension Other   . Cancer Other     lung, esophagus, stomach  . Stroke Other   . Heart disease  Father   . Heart disease Sister   . Heart attack Sister   . Heart disease Brother   . Heart attack Brother     Review of Systems: The review of systems is positive for IBS. All other systems were reviewed and are negative.  Physical Exam: BP 132/80  Pulse 68  Ht 5' 3.5" (1.613 m)  Wt 162 lb (73.483 kg)  BMI 28.25 kg/m2 Patient is very pleasant and in no acute distress. Skin is warm and dry. Color is normal.  HEENT is unremarkable. Normocephalic/atraumatic. PERRL. Sclera are nonicteric. Neck is supple. No masses. No JVD. Lungs are clear. Cardiac exam shows a regular rate and rhythm. Abdomen is soft. Extremities are without edema. Gait and ROM are intact. No gross neurologic deficits noted.  LABORATORY DATA: BMET is pending.   Assessment / Plan:

## 2011-01-31 NOTE — Assessment & Plan Note (Addendum)
Blood pressure is better. She is happy with her current regimen. 24 hour urine was negative for metanephrines. We will see her back in 4 months. May need to consider stress testing in the future. She will continue to monitor her blood pressure at home. Patient is agreeable to this plan and will call if any problems develop in the interim.    BMET is checked today. She will call and let us know if her dry cough worsens or is she wishes to change to very low dose ARB. She had profound hypotension with 40 mg of Benicar in the past.

## 2011-02-02 ENCOUNTER — Telehealth: Payer: Self-pay | Admitting: *Deleted

## 2011-02-02 NOTE — Telephone Encounter (Signed)
Message copied by Burnell Blanks on Thu Feb 02, 2011  9:40 AM ------      Message from: Rosalio Macadamia      Created: Tue Jan 31, 2011  4:43 PM       Ok to report. Labs are satisfactory.

## 2011-02-02 NOTE — Telephone Encounter (Signed)
Advised of labs 

## 2011-02-02 NOTE — Progress Notes (Signed)
Advised 

## 2011-03-21 ENCOUNTER — Other Ambulatory Visit (HOSPITAL_COMMUNITY): Payer: Self-pay | Admitting: Gynecology

## 2011-03-21 ENCOUNTER — Telehealth: Payer: Self-pay | Admitting: Cardiology

## 2011-03-21 DIAGNOSIS — Z1231 Encounter for screening mammogram for malignant neoplasm of breast: Secondary | ICD-10-CM

## 2011-03-21 DIAGNOSIS — I119 Hypertensive heart disease without heart failure: Secondary | ICD-10-CM

## 2011-03-21 NOTE — Telephone Encounter (Signed)
New message:  Pt is leaving the country in January and needs a 3 month supply of her blood pressure medicine to mail in.  Please call patient when written prescription is ready for pick up.  [phone]/leave message if no answer.

## 2011-03-23 MED ORDER — HYDROCHLOROTHIAZIDE 12.5 MG PO CAPS: 12.5000 mg | ORAL_CAPSULE | Freq: Every day | ORAL | Status: DC

## 2011-03-23 NOTE — Telephone Encounter (Signed)
Left message, hopefully needing HCTZ.  At front desk for pickup

## 2011-03-27 ENCOUNTER — Telehealth: Payer: Self-pay | Admitting: Cardiology

## 2011-03-27 DIAGNOSIS — I119 Hypertensive heart disease without heart failure: Secondary | ICD-10-CM

## 2011-03-27 MED ORDER — LISINOPRIL 5 MG PO TABS: 5.0000 mg | ORAL_TABLET | Freq: Every day | ORAL | Status: DC

## 2011-03-27 NOTE — Telephone Encounter (Signed)
Advised patient ready to pick up (lisinopril started at September office visit but not added to med list)

## 2011-03-27 NOTE — Telephone Encounter (Signed)
FU Call: Pt calling wanting to make sure RX for lisinopril is available for pt at the front desk as well as the HCTZ. Please return pt call to discuss further if necessary.

## 2011-04-24 ENCOUNTER — Other Ambulatory Visit: Payer: Self-pay | Admitting: Gynecology

## 2011-04-26 ENCOUNTER — Ambulatory Visit (HOSPITAL_COMMUNITY)
Admission: RE | Admit: 2011-04-26 | Discharge: 2011-04-26 | Disposition: A | Discharge status: Home / Self Care | Payer: Medicare Other | Source: Ambulatory Visit | Attending: Gynecology | Admitting: Gynecology

## 2011-04-26 DIAGNOSIS — Z1231 Encounter for screening mammogram for malignant neoplasm of breast: Secondary | ICD-10-CM | POA: Insufficient documentation

## 2011-05-03 LAB — HM DEXA SCAN: HM Dexa Scan: NORMAL

## 2011-09-12 ENCOUNTER — Emergency Department (HOSPITAL_COMMUNITY)
Admission: EM | Admit: 2011-09-12 | Discharge: 2011-09-12 | Disposition: A | Discharge status: Home / Self Care | Payer: Medicare Other | Attending: Emergency Medicine | Admitting: Emergency Medicine

## 2011-09-12 ENCOUNTER — Telehealth: Payer: Self-pay | Admitting: Nurse Practitioner

## 2011-09-12 ENCOUNTER — Encounter (HOSPITAL_COMMUNITY): Payer: Self-pay | Admitting: Emergency Medicine

## 2011-09-12 DIAGNOSIS — I10 Essential (primary) hypertension: Secondary | ICD-10-CM | POA: Insufficient documentation

## 2011-09-12 DIAGNOSIS — I471 Supraventricular tachycardia: Secondary | ICD-10-CM

## 2011-09-12 DIAGNOSIS — K219 Gastro-esophageal reflux disease without esophagitis: Secondary | ICD-10-CM | POA: Insufficient documentation

## 2011-09-12 DIAGNOSIS — K589 Irritable bowel syndrome without diarrhea: Secondary | ICD-10-CM | POA: Insufficient documentation

## 2011-09-12 DIAGNOSIS — I498 Other specified cardiac arrhythmias: Secondary | ICD-10-CM | POA: Insufficient documentation

## 2011-09-12 HISTORY — DX: Essential (primary) hypertension: I10

## 2011-09-12 LAB — DIFFERENTIAL
Eosinophils Absolute: 0.1 10*3/uL (ref 0.0–0.7)
Lymphocytes Relative: 21 % (ref 12–46)
Lymphs Abs: 1.4 10*3/uL (ref 0.7–4.0)
Monocytes Relative: 8 % (ref 3–12)
Neutrophils Relative %: 68 % (ref 43–77)

## 2011-09-12 LAB — CBC
Hemoglobin: 12.1 g/dL (ref 12.0–15.0)
MCH: 32.3 pg (ref 26.0–34.0)
Platelets: 237 10*3/uL (ref 150–400)
RBC: 3.75 MIL/uL — ABNORMAL LOW (ref 3.87–5.11)
WBC: 6.5 10*3/uL (ref 4.0–10.5)

## 2011-09-12 LAB — COMPREHENSIVE METABOLIC PANEL
ALT: 15 U/L (ref 0–35)
Alkaline Phosphatase: 47 U/L (ref 39–117)
BUN: 22 mg/dL (ref 6–23)
CO2: 19 mEq/L (ref 19–32)
Chloride: 103 mEq/L (ref 96–112)
GFR calc Af Amer: 50 mL/min — ABNORMAL LOW (ref >90)
GFR calc non Af Amer: 43 mL/min — ABNORMAL LOW (ref >90)
Glucose, Bld: 96 mg/dL (ref 70–99)
Potassium: 3.5 mEq/L (ref 3.5–5.1)
Sodium: 137 mEq/L (ref 135–145)
Total Bilirubin: 0.2 mg/dL — ABNORMAL LOW (ref 0.3–1.2)
Total Protein: 6.6 g/dL (ref 6.0–8.3)

## 2011-09-12 LAB — CARDIAC PANEL(CRET KIN+CKTOT+MB+TROPI)
Relative Index: 2.8 — ABNORMAL HIGH (ref 0.0–2.5)
Total CK: 123 U/L (ref 7–177)

## 2011-09-12 NOTE — Discharge Instructions (Signed)
Supraventricular Tachycardia  Supraventricular tachycardia (SVT) is an abnormal heart rhythm (arrhythmia) that causes the heart to beat very fast (tachycardia). This kind of fast heartbeat originates in the upper chambers of the heart (atria). SVT can cause the heart to beat greater than 100 beats per minute. SVT can have a rapid burst of heartbeats. This can start and stop suddenly without warning and is called nonsustained. SVT can also be sustained, in which the heart beats at a continuous fast rate.   CAUSES   There can be different causes of SVT. Some of these include:   Heart valve problems such as mitral valve prolapse.   An enlarged heart (hypertrophic cardiomyopathy).   Congenital heart problems.   Heart inflammation (pericarditis).   Hyperthyroidism.   Low potassium or magnesium levels.   Caffeine.   Drug use such as cocaine, methamphetamines, or stimulants.   Some over-the-counter medicines such as:   Decongestants.   Diet medicines.   Herbal medicines.  SYMPTOMS   Symptoms of SVT can vary. Symptoms depend on whether the SVT is sustained or nonsustained. You may experience:   No symptoms (asymptomatic).   An awareness of your heart beating rapidly (palpitations).   Shortness of breath.   Chest pain or pressure.  If your blood pressure drops because of the SVT, you may experience:   Fainting or near fainting.   Weakness.   Dizziness.  DIAGNOSIS   Different tests can be performed to diagnose SVT, such as:   An electrocardiogram (EKG). This is a painless test that records the electrical activity of your heart.   Holter monitor. This is a 24 hour recording of your heart rhythm. You will be given a diary. Write down all symptoms that you have and what you were doing at the time you experienced symptoms.   Arrhythmia monitor. This is a small device that your wear for several weeks. It records the heart rhythm when you have symptoms.   Echocardiogram. This is an imaging test to help detect  abnormal heart structure such as congenital abnormalities, heart valve problems, or heart enlargement.   Stress test. This test can help determine if the SVT is related to exercise.   Electrophysiology study (EPS). This is a procedure that evaluates your heart's electrical system and can help your caregiver find the cause of your SVT.  TREATMENT   Treatment of SVT depends on the symptoms, how often it recurs, and whether there are any underlying heart problems.    If symptoms are rare and no other cardiac disease is present, no treatment may be needed.   Blood work may be done to check potassium, magnesium, and thyroid hormone levels to see if they are abnormal. If these levels are abnormal, treatment to correct the problems will occur.  Medicines  Your caregiver may use oral medicines to treat SVT. These medicines are given for long-term control of SVT. Medicines may be used alone or in combination with other treatments. These medicines work to slow nerve impulses in the heart muscle. These medicines can also be used to treat high blood pressure. Some of these medicines may include:   Calcium channel blockers.   Beta blockers.   Digoxin.  Nonsurgical procedures  Nonsurgical techniques may be used if oral medicines do not work. Some examples include:   Cardioversion. This technique uses either drugs or an electrical shock to restore a normal heart rhythm.   Cardioversion drugs may be given through an intravenous (IV) line to help "reset" the   heart rhythm.   In electrical cardioversion, the caregiver shocks your heart to stop its beat for a split second. This helps to reset the heart to a normal rhythm.   Ablation. This procedure is done under mild sedation. High frequency radio wave energy is used to destroy the area of heart tissue responsible for the SVT.  HOME CARE INSTRUCTIONS    Do not smoke.   Only take medicines prescribed by your caregiver. Check with your caregiver before using over-the-counter  medicines.   Check with your caregiver about how much alcohol and caffeine (coffee, tea, colas, or chocolate) you may have.   It is very important to keep all follow-up referrals and appointments in order to properly manage this problem.  SEEK IMMEDIATE MEDICAL CARE IF:   You have dizziness.   You faint or nearly faint.   You have shortness of breath.   You have chest pain or pressure.   You have sudden nausea or vomiting.   You have profuse sweating.   You are concerned about how long your symptoms last.   You are concerned about the frequency of your SVT episodes.  If you have the above symptoms, call your local emergency services (911 in [place] immediately. Do not drive yourself to the hospital.  MAKE SURE YOU:    Understand these instructions.   Will watch your condition.   Will get help right away if you are not doing well or get worse.  Document Released: 04/03/2005 Document Revised: 03/23/2011 Document Reviewed: 07/16/2008  [employer] Patient Information 2012 [employer].

## 2011-09-12 NOTE — ED Notes (Signed)
Pt. States she had chest pressure and heart racing around 1800. Pt. Checked HR (139) and blood pressure (143/87) at onset.  States she took Lisinopril and 1 baby aspirin with no relief. Checked HR (164) and blood pressure (127/89) before she left for the hospital.  Currently HR 96 BP 137/61.  Pt. Denies history of same. Pt. Alert and oriented X4.

## 2011-09-12 NOTE — ED Notes (Signed)
Patient with heart racing, with some tightness at upper chest and into neck, right shoulder ache.  Started before 6.  Patient took xanax, Lisinopril and baby ASA without any relief.

## 2011-09-12 NOTE — Telephone Encounter (Signed)
Pt called this evening stating that she was driving home this evening and had sudden onset of tachypalpitations and mild shoulder discomfort.  She has never had this before.  I advised that she come into the ED for evaluation.  She plans to come into [facility] tonight.

## 2011-09-12 NOTE — ED Provider Notes (Signed)
History     CSN: [id number]  Arrival date & time 09/12/11  1924   First MD Initiated Contact with Patient 09/12/11 1942      Chief Complaint  Patient presents with  . Tachycardia    (Consider location/radiation/quality/duration/timing/severity/associated sxs/prior treatment) HPI Comments: Started with palpitations, heart racing about 1 hour and 45 minutes ago.  She now feels short of breath.  This has never happened before.    Patient is a 66 y.o. female presenting with palpitations. The history is provided by the patient.  Palpitations  This is a new problem. The current episode started 1 to 2 hours ago. The problem occurs constantly. The problem has not changed since onset.Associated with: nothing. Associated symptoms include chest pressure and shortness of breath. Pertinent negatives include no diaphoresis and no nausea. Treatments tried: coughing. The treatment provided no relief.    Past Medical History  Diagnosis Date  . Hypertension   . IBS (irritable bowel syndrome)   . GERD (gastroesophageal reflux disease)     Past Surgical History  Procedure Date  . Cholecystectomy   . Appendectomy     No family history on file.  History  Substance Use Topics  . Smoking status: Former Games developer  . Smokeless tobacco: Not on file  . Alcohol Use: 2.4 oz/week    4 Glasses of wine per week    OB History    Grav Para Term Preterm Abortions TAB SAB Ect Mult Living                  Review of Systems  Constitutional: Negative for diaphoresis.  Respiratory: Positive for shortness of breath.   Cardiovascular: Positive for palpitations.  Gastrointestinal: Negative for nausea.  All other systems reviewed and are negative.    Allergies  Daypro  Home Medications  No current outpatient prescriptions on file.  BP 123/76  Pulse 161  Temp 98.6 F (37 C)  Resp 18  SpO2 100%  Physical Exam  Nursing note and vitals reviewed. Constitutional: She is oriented to person,  place, and time. She appears well-developed and well-nourished. No distress.       Appears anxious.  HENT:  Head: Normocephalic and atraumatic.  Neck: Normal range of motion. Neck supple.  Cardiovascular: Regular rhythm.  Exam reveals no gallop and no friction rub.   No murmur heard.      Tachycardic  Pulmonary/Chest: Effort normal and breath sounds normal. No respiratory distress. She has no wheezes.  Abdominal: Soft. Bowel sounds are normal. She exhibits no distension. There is no tenderness.  Musculoskeletal: Normal range of motion.  Neurological: She is alert and oriented to person, place, and time.  Skin: Skin is warm and dry. She is not diaphoretic.    ED Course  Procedures (including critical care time)   Labs Reviewed  CBC  DIFFERENTIAL  COMPREHENSIVE METABOLIC PANEL  CARDIAC PANEL(CRET KIN+CKTOT+MB+TROPI)  TSH   No results found.   No diagnosis found.   Date: 09/12/2011  Rate: 161  Rhythm: supraventricular tachycardia (SVT)  QRS Axis: normal  Intervals: normal  ST/T Wave abnormalities: nonspecific ST changes  Conduction Disutrbances:none  Narrative Interpretation:   Old EKG Reviewed: none available    MDM  The patient arrived in svt at a rate of 161.  Shortly after arriving to the room, she then broke to a sinus rhythm of 101.  The labs look okay.  She feels fine and wants to go home.  She sees Dr. Patty Sermons and assures me she  will see him in the near future, and return prn if she worsens.        Geoffery Lyons, MD 09/12/11 2106

## 2011-09-12 NOTE — ED Notes (Signed)
Patient was told by her MD to come in for evaluation of her racing heart rate.  Patient's radial pulse is 164.  Patient states that she was driving when she started feeling her heart race.  Patient has states that she feels pressure on her chest, right shoulder hurts, and pressure on her neck.  Patient denies any history of same in the past.  Patient is alert and oriented x 3 and ambulatory.  Patient taken back to EKG.

## 2011-09-13 ENCOUNTER — Encounter: Payer: Self-pay | Admitting: Nurse Practitioner

## 2011-09-25 ENCOUNTER — Ambulatory Visit (INDEPENDENT_AMBULATORY_CARE_PROVIDER_SITE_OTHER): Payer: Medicare Other | Admitting: Cardiology

## 2011-09-25 ENCOUNTER — Encounter: Payer: Self-pay | Admitting: Cardiology

## 2011-09-25 VITALS — BP 130/88 | HR 68 | Ht 63.0 in | Wt 154.0 lb

## 2011-09-25 DIAGNOSIS — E7800 Pure hypercholesterolemia, unspecified: Secondary | ICD-10-CM

## 2011-09-25 DIAGNOSIS — I471 Supraventricular tachycardia, unspecified: Secondary | ICD-10-CM | POA: Insufficient documentation

## 2011-09-25 DIAGNOSIS — R079 Chest pain, unspecified: Secondary | ICD-10-CM

## 2011-09-25 DIAGNOSIS — E876 Hypokalemia: Secondary | ICD-10-CM

## 2011-09-25 MED ORDER — POTASSIUM CHLORIDE CRYS ER 20 MEQ PO TBCR: 20.0000 meq | EXTENDED_RELEASE_TABLET | Freq: Every day | ORAL | Status: DC

## 2011-09-25 MED ORDER — METOPROLOL TARTRATE 50 MG PO TABS: 50.0000 mg | ORAL_TABLET | ORAL | Status: DC | PRN

## 2011-09-25 NOTE — Progress Notes (Signed)
Susan Gillespie Date of Birth:  Mar 31, 1946 [facility] [address] [place], [place]  [address] [phone]         Fax   [phone]  History of Present Illness: This pleasant 66 year old woman is seen for a followup office visit.  She was recently seen in the emergency room for paroxysmal supraventricular tachycardia.  Heart rate was in the 160 range.  She went back into normal sinus rhythm on her own prior to being given any antiarrhythmic therapy.  While she was having the tachycardia she was experiencing some right upper chest discomfort.  He described it as an ache.  He did not radiate.  The patient does not have any history of known ischemic heart disease.  She had a remote Cardiolite stress test about 10 years ago which was normal.  The patient has a history of high blood pressure.  She has a strong family history of ischemic heart disease. Current Outpatient Prescriptions  Medication Sig Dispense Refill  . ALPRAZolam (XANAX) 0.5 MG tablet Take 0.25 mg by mouth at bedtime.      . Calcium Carbonate (CALCIUM 500 PO) Take 1 tablet by mouth 2 (two) times daily.      . celecoxib (CELEBREX) 200 MG capsule Take 200 mg by mouth daily.        . cetirizine (ZYRTEC) 10 MG tablet Take 10 mg by mouth daily.        . Cholecalciferol (VITAMIN D3) 2000 UNITS TABS Take 1 tablet by mouth daily. Taking 10,000 daily      . esomeprazole (NEXIUM) 40 MG capsule Take 40 mg by mouth daily before breakfast.        . estradiol (VIVELLE-DOT) 0.075 MG/24HR Place 0.5 patches onto the skin 2 (two) times a week. Wednesday and saturdays      . FOLIC ACID PO Take by mouth.        . hydrochlorothiazide (MICROZIDE) 12.5 MG capsule Take 1 capsule (12.5 mg total) by mouth daily.  90 capsule  3  . Loperamide HCl (IMODIUM PO) Take 1-2 tablets by mouth every morning.      . metoprolol (LOPRESSOR) 50 MG tablet Take 1 tablet (50 mg total) by mouth as needed.  20 tablet  3  . Omega-3 Fatty Acids (FISH  OIL) 1000 MG CAPS Take 2 capsules by mouth daily.        . potassium chloride SA (K-DUR,KLOR-CON) 20 MEQ tablet Take 1 tablet (20 mEq total) by mouth daily.  30 tablet  5  . rosuvastatin (CRESTOR) 20 MG tablet Take 10 mg by mouth daily.      . traZODone (DESYREL) 100 MG tablet Take 100 mg by mouth at bedtime.        Marland Kitchen DISCONTD: ALPRAZolam (XANAX) 0.5 MG tablet as needed.        Marland Kitchen DISCONTD: celecoxib (CELEBREX) 200 MG capsule Take 200 mg by mouth daily.      Marland Kitchen DISCONTD: Cetirizine HCl (ZYRTEC PO) Take 1 tablet by mouth daily.      Marland Kitchen DISCONTD: Cholecalciferol (VITAMIN D-3 PO) Take 1 tablet by mouth daily.      Marland Kitchen DISCONTD: esomeprazole (NEXIUM) 40 MG capsule Take 40 mg by mouth daily before breakfast.      . DISCONTD: estradiol (VIVELLE-DOT) 0.05 MG/24HR Place 1 patch onto the skin 2 (two) times a week. Taking 0.0375 daily      . DISCONTD: FOLIC ACID PO Take 1 tablet by mouth daily.      Marland Kitchen  DISCONTD: hydrochlorothiazide (MICROZIDE) 12.5 MG capsule Take 12.5 mg by mouth daily.      Marland Kitchen DISCONTD: rosuvastatin (CRESTOR) 10 MG tablet Take 10 mg by mouth daily.        Marland Kitchen DISCONTD: traZODone (DESYREL) 100 MG tablet Take 100 mg by mouth at bedtime.        Allergies  Allergen Reactions  . Daypro (Oxaprozin) Swelling    facial  . Lisinopril     cough  . Other Itching    narcotics  . Oxaprozin   . Simvastatin     REACTION: myalgias and fatigue    Patient Active Problem List  Diagnoses  . DIABETES MELLITUS, TYPE II  . HYPERLIPIDEMIA  . GERD  . OSTEOARTHRITIS  . HEADACHE  . C-REACTIVE PROTEIN, SERUM, ELEVATED  . COLONIC POLYPS, HX OF  . DIVERTICULITIS, HX OF  . Essential hypertension, benign  . Benign hypertensive heart disease without heart failure    History  Smoking status  . Former Smoker  Smokeless tobacco  . Not on file    History  Alcohol Use  . 2.4 oz/week  . 4 Glasses of wine per week    Family History  Problem Relation Age of Onset  . Hyperlipidemia Other   .  Hypertension Other   . Cancer Other     lung, esophagus, stomach  . Stroke Other   . Heart disease Father   . Heart disease Sister   . Heart attack Sister   . Heart disease Brother   . Heart attack Brother     Review of Systems: Constitutional: no fever chills diaphoresis or fatigue or change in weight.  Head and neck: no hearing loss, no epistaxis, no photophobia or visual disturbance. Respiratory: No cough, shortness of breath or wheezing. Cardiovascular: No chest pain peripheral edema, palpitations. Gastrointestinal: No abdominal distention, no abdominal pain, no change in bowel habits hematochezia or melena. Genitourinary: No dysuria, no frequency, no urgency, no nocturia. Musculoskeletal:No arthralgias, no back pain, no gait disturbance or myalgias. Neurological: No dizziness, no headaches, no numbness, no seizures, no syncope, no weakness, no tremors. Hematologic: No lymphadenopathy, no easy bruising. Psychiatric: No confusion, no hallucinations, no sleep disturbance.    Physical Exam: Filed Vitals:   09/25/11 1506  BP: 130/88  Pulse: 68   the general appearance reveals a well-developed well-nourished woman in no distress.The head and neck exam reveals pupils equal and reactive.  Extraocular movements are full.  There is no scleral icterus.  The mouth and pharynx are normal.  The neck is supple.  The carotids reveal no bruits.  The jugular venous pressure is normal.  The  thyroid is not enlarged.  There is no lymphadenopathy.  The chest is clear to percussion and auscultation.  There are no rales or rhonchi.  Expansion of the chest is symmetrical.  The precordium is quiet.  The first heart sound is normal.  The second heart sound is physiologically split.  There is no murmur gallop rub or click.  There is no abnormal lift or heave.  The abdomen is soft and nontender.  The bowel sounds are normal.  The liver and spleen are not enlarged.  There are no abdominal masses.  There are no  abdominal bruits.  Extremities reveal good pedal pulses.  There is no phlebitis or edema.  There is no cyanosis or clubbing.  Strength is normal and symmetrical in all extremities.  There is no lateralizing weakness.  There are no sensory deficits.  The skin  is warm and dry.  There is no rash.  EKG from 09/12/11 was reviewed   Assessment / Plan: The patient is to return for a treadmill Myoview stress test to evaluate her chest discomfort further.  She has a strong family history of premature coronary disease.  She herself has multiple risk factors. At the time of her presentation in supraventricular tachycardia her potassium was borderline low at 3.5 secondary to her blood pressure medication and we are going to head K. Dur 20 mEq one daily. She will have Lopressor 50 mg tablets on hand to use as a pill in the pocket if she has recurrent SVT.  She will avoid excessive caffeine She will return for a routine followup office visit and basal metabolic panel in 4 months

## 2011-09-25 NOTE — Patient Instructions (Signed)
Your physician has requested that you have en exercise stress myoview. For further information please visit [email or web address] Please follow instruction sheet, as given.  Start Kdur 20 meq daily  Use Lopressor as needed   Your physician wants you to follow-up in: 4 months You will receive a reminder letter in the mail two months in advance. If you don't receive a letter, please call our office to schedule the follow-up appointment.

## 2011-09-25 NOTE — Assessment & Plan Note (Signed)
Plan the patient was in tachycardia she did have ST segment depression in the inferior and lateral leads and was experiencing some right-sided chest discomfort.

## 2011-10-03 ENCOUNTER — Ambulatory Visit (HOSPITAL_COMMUNITY): Payer: Medicare Other | Attending: Cardiovascular Disease | Admitting: Radiology

## 2011-10-03 VITALS — BP 136/78 | HR 57 | Ht 63.0 in | Wt 154.0 lb

## 2011-10-03 DIAGNOSIS — I10 Essential (primary) hypertension: Secondary | ICD-10-CM

## 2011-10-03 DIAGNOSIS — E785 Hyperlipidemia, unspecified: Secondary | ICD-10-CM

## 2011-10-03 DIAGNOSIS — E119 Type 2 diabetes mellitus without complications: Secondary | ICD-10-CM

## 2011-10-03 DIAGNOSIS — I471 Supraventricular tachycardia: Secondary | ICD-10-CM

## 2011-10-03 DIAGNOSIS — R079 Chest pain, unspecified: Secondary | ICD-10-CM

## 2011-10-03 DIAGNOSIS — R0602 Shortness of breath: Secondary | ICD-10-CM

## 2011-10-03 MED ORDER — TECHNETIUM TC 99M TETROFOSMIN IV KIT
11.0000 | PACK | Freq: Once | INTRAVENOUS | Status: AC | PRN
  Administered 2011-10-03: 11 via INTRAVENOUS

## 2011-10-03 MED ORDER — TECHNETIUM TC 99M TETROFOSMIN IV KIT
33.0000 | PACK | Freq: Once | INTRAVENOUS | Status: AC | PRN
  Administered 2011-10-03: 33 via INTRAVENOUS

## 2011-10-03 NOTE — Progress Notes (Signed)
[facility] SITE 3 NUCLEAR MED [address] [place] [address] [phone]  Cardiology Nuclear Med Study  Susan Gillespie is a 66 y.o. female     MRN : [id number]     DOB: 03/21/1946  Procedure Date: 10/03/2011  Nuclear Med Background Indication for Stress Test:  Evaluation for Ischemia History:  09/12/11 MCED with SVT; '00 OZH:YQMVHQ per patient Cardiac Risk Factors: Family History - CAD, History of Smoking, Hypertension, Lipids and NIDDM  Symptoms:  (R) Upper Chest "Ache" (last episode of chest discomfort was with the SVT), DOE, Palpitations, Rapid HR and SOB with SVT   Nuclear Pre-Procedure Caffeine/Decaff Intake:  None NPO After: 8:00pm   Lungs:  Clear. O2 Sat: 98% on room air. IV 0.9% NS with Angio Cath:  20g  IV Site: R Antecubital  IV Started by:  Stanton Kidney, EMT-P  Chest Size (in):  38 Cup Size: C  Height: 5\' 3"  (1.6 m)  Weight:  154 lb (69.854 kg)  BMI:  Body mass index is 27.28 kg/(m^2). Tech Comments:  Lopressor not yet started, per patient.    Nuclear Med Study 1 or 2 day study: 1 day  Stress Test Type:  Stress  Reading MD: Kristeen Miss, MD  Order Authorizing Provider:  Cassell Clement, MD  Resting Radionuclide: Technetium 62m Tetrofosmin  Resting Radionuclide Dose: 11.0 mCi   Stress Radionuclide:  Technetium 76m Tetrofosmin  Stress Radionuclide Dose: 31.7 mCi           Stress Protocol Rest HR: 57 Stress HR: 137  Rest BP: 136/78 Stress BP: 182/59  Exercise Time (min): 6:15 METS: 7.0   Predicted Max HR: 153 bpm % Max HR: 89.54 bpm Rate Pressure Product: 46962   Dose of Adenosine (mg):  n/a Dose of Lexiscan: n/a mg  Dose of Atropine (mg): n/a Dose of Dobutamine: n/a mcg/kg/min (at max HR)  Stress Test Technologist: Smiley Houseman, CMA-N  Nuclear Technologist:  Doyne Keel, CNMT     Rest Procedure:  Myocardial perfusion imaging was performed at rest 45 minutes following the intravenous administration of Technetium 69m  Tetrofosmin.  Rest ECG: No acute changes.  Stress Procedure:  The patient exercised on the treadmill utilizing the Bruce protocol for 6:15 minutes.  She then stopped due to fatigue and dyspnea.  She denied any chest pain.  There were no diagnostic ST-T wave changes, occasional PAC's/PVC's were noted.  She did have a hypotensive response to exercise, 182/59 to 158/71 at peak exercise.  Technetium 10m Tetrofosmin was injected at peak exercise and myocardial perfusion imaging was performed after a brief delay.  Stress ECG: No significant change from baseline ECG  QPS Raw Data Images:  Normal; no motion artifact; normal heart/lung ratio. Stress Images:  Normal homogeneous uptake in all areas of the myocardium. Rest Images:  Normal homogeneous uptake in all areas of the myocardium. Subtraction (SDS):  No evidence of ischemia. Transient Ischemic Dilatation (Normal <1.22):  1.19 Lung/Heart Ratio (Normal <0.45):  0.23  Quantitative Gated Spect Images QGS EDV:  51 ml QGS ESV:  15 ml  Impression Exercise Capacity:  Good exercise capacity. BP Response:  Normal blood pressure response. Clinical Symptoms:  No significant symptoms noted. ECG Impression:  No significant ST segment change suggestive of ischemia. Comparison with Prior Nuclear Study: No images to compare  Overall Impression:  Normal stress nuclear study.  There is no evidence of ischemia.  Normal LV function.  LV Ejection Fraction: 70%.  LV Wall Motion:  NL  LV Function; NL Wall Motion    Susan Gillespie, Susan Gillespie., MD, [facility] 10/03/2011, 4:54 PM Office - [phone] Pager [phone]

## 2012-01-03 LAB — HM DIABETES EYE EXAM: HM Diabetic Eye Exam: NORMAL

## 2012-02-05 ENCOUNTER — Ambulatory Visit (INDEPENDENT_AMBULATORY_CARE_PROVIDER_SITE_OTHER): Payer: Medicare Other | Admitting: Cardiology

## 2012-02-05 ENCOUNTER — Encounter: Payer: Self-pay | Admitting: Cardiology

## 2012-02-05 VITALS — BP 136/79 | HR 76 | Ht 63.0 in | Wt 158.0 lb

## 2012-02-05 DIAGNOSIS — I119 Hypertensive heart disease without heart failure: Secondary | ICD-10-CM

## 2012-02-05 DIAGNOSIS — I471 Supraventricular tachycardia, unspecified: Secondary | ICD-10-CM

## 2012-02-05 DIAGNOSIS — E785 Hyperlipidemia, unspecified: Secondary | ICD-10-CM

## 2012-02-05 DIAGNOSIS — E876 Hypokalemia: Secondary | ICD-10-CM

## 2012-02-05 LAB — BASIC METABOLIC PANEL
BUN: 19 mg/dL (ref 6–23)
CO2: 29 mEq/L (ref 19–32)
Calcium: 10 mg/dL (ref 8.4–10.5)
GFR: 65.37 mL/min (ref >60.00)
Glucose, Bld: 92 mg/dL (ref 70–99)

## 2012-02-05 LAB — T3, FREE: T3, Free: 3 pg/mL (ref 2.3–4.2)

## 2012-02-05 MED ORDER — ROSUVASTATIN CALCIUM 20 MG PO TABS: 10.0000 mg | ORAL_TABLET | Freq: Every day | ORAL | Status: DC

## 2012-02-05 MED ORDER — HYDROCHLOROTHIAZIDE 12.5 MG PO CAPS: 12.5000 mg | ORAL_CAPSULE | Freq: Every day | ORAL | Status: DC

## 2012-02-05 MED ORDER — POTASSIUM CHLORIDE CRYS ER 10 MEQ PO TBCR: 20.0000 meq | EXTENDED_RELEASE_TABLET | Freq: Two times a day (BID) | ORAL | Status: DC

## 2012-02-05 NOTE — Assessment & Plan Note (Signed)
The patient has a history of hyperlipidemia.  She is on Crestor.  He has had borderline blood sugar readings in the past.  She is not on any medication for this but is watching her carbohydrates more closely.

## 2012-02-05 NOTE — Assessment & Plan Note (Signed)
Her blood pressure has been remaining stable on current therapy.  No headaches or dizziness.

## 2012-02-05 NOTE — Patient Instructions (Addendum)
Will obtain labs today and call you with the results (T3, T4, TSH, bmet)  Your physician recommends that you continue on your current medications as directed. Please refer to the Current Medication list given to you today.  Your physician wants you to follow-up in: 6 months You will receive a reminder letter in the mail two months in advance. If you don't receive a letter, please call our office to schedule the follow-up appointment.

## 2012-02-05 NOTE — Assessment & Plan Note (Signed)
The patient has had no further episodes of SVT since last visit.  We are checking thyroid function studies today.

## 2012-02-05 NOTE — Progress Notes (Signed)
Susan Gillespie Date of Birth:  05-09-1945 [facility] [address] [place], [place]  [address] [phone]         Fax   [phone]  History of Present Illness: This pleasant 66 year old woman is seen for a followup office visit. She was recently seen in the emergency room for paroxysmal supraventricular tachycardia. Heart rate was in the 160 range. She went back into normal sinus rhythm on her own prior to being given any antiarrhythmic therapy. While she was having the tachycardia she was experiencing some right upper chest discomfort. He described it as an ache. He did not radiate. The patient does not have any history of known ischemic heart disease. She had a remote Cardiolite stress test about 10 years ago which was normal. The patient has a history of high blood pressure. She has a strong family history of ischemic heart disease.   Current Outpatient Prescriptions  Medication Sig Dispense Refill  . ALPRAZolam (XANAX) 0.5 MG tablet Take 0.25 mg by mouth at bedtime.      . Calcium Carbonate (CALCIUM 500 PO) Take 1 tablet by mouth 2 (two) times daily.      . celecoxib (CELEBREX) 200 MG capsule Take 200 mg by mouth daily.        . cetirizine (ZYRTEC) 10 MG tablet Take 10 mg by mouth daily.        . Cholecalciferol (VITAMIN D3) 2000 UNITS TABS Take 1 tablet by mouth daily. Taking 10,000 daily      . esomeprazole (NEXIUM) 40 MG capsule Take 40 mg by mouth daily before breakfast.        . estradiol (VIVELLE-DOT) 0.075 MG/24HR Place 0.5 patches onto the skin 2 (two) times a week. Wednesday and saturdays      . FOLIC ACID PO Take by mouth.        . hydrochlorothiazide (MICROZIDE) 12.5 MG capsule Take 1 capsule (12.5 mg total) by mouth daily.  90 capsule  3  . Loperamide HCl (IMODIUM PO) Take 1-2 tablets by mouth every morning.      . metoprolol (LOPRESSOR) 50 MG tablet Take 1 tablet (50 mg total) by mouth as needed.  20 tablet  3  . Omega-3 Fatty Acids (FISH OIL) 1000  MG CAPS Take 2 capsules by mouth daily.        . rosuvastatin (CRESTOR) 20 MG tablet Take 0.5 tablets (10 mg total) by mouth daily.  45 tablet  3  . traZODone (DESYREL) 100 MG tablet Take 100 mg by mouth at bedtime.        Marland Kitchen DISCONTD: hydrochlorothiazide (MICROZIDE) 12.5 MG capsule Take 1 capsule (12.5 mg total) by mouth daily.  90 capsule  3  . DISCONTD: rosuvastatin (CRESTOR) 20 MG tablet Take 10 mg by mouth daily.      . potassium chloride SA (K-DUR,KLOR-CON) 10 MEQ tablet Take 2 tablets (20 mEq total) by mouth 2 (two) times daily.  180 tablet  3    Allergies  Allergen Reactions  . Daypro (Oxaprozin) Swelling    facial  . Lisinopril     cough  . Other Itching    narcotics  . Oxaprozin   . Simvastatin     REACTION: myalgias and fatigue    Patient Active Problem List  Diagnosis  . DIABETES MELLITUS, TYPE II  . HYPERLIPIDEMIA  . GERD  . OSTEOARTHRITIS  . HEADACHE  . C-REACTIVE PROTEIN, SERUM, ELEVATED  . COLONIC POLYPS, HX OF  . DIVERTICULITIS, HX  OF  . Essential hypertension, benign  . Benign hypertensive heart disease without heart failure  . Paroxysmal supraventricular tachycardia  . Chest pain    History  Smoking status  . Former Smoker  Smokeless tobacco  . Not on file    History  Alcohol Use  . 2.4 oz/week  . 4 Glasses of wine per week    Family History  Problem Relation Age of Onset  . Hyperlipidemia Other   . Hypertension Other   . Cancer Other     lung, esophagus, stomach  . Stroke Other   . Heart disease Father   . Heart disease Sister   . Heart attack Sister   . Heart disease Brother   . Heart attack Brother     Review of Systems: Constitutional: no fever chills diaphoresis or fatigue or change in weight.  Head and neck: no hearing loss, no epistaxis, no photophobia or visual disturbance. Respiratory: No cough, shortness of breath or wheezing. Cardiovascular: No chest pain peripheral edema, palpitations. Gastrointestinal: No abdominal  distention, no abdominal pain, no change in bowel habits hematochezia or melena. Genitourinary: No dysuria, no frequency, no urgency, no nocturia. Musculoskeletal:No arthralgias, no back pain, no gait disturbance or myalgias. Neurological: No dizziness, no headaches, no numbness, no seizures, no syncope, no weakness, no tremors. Hematologic: No lymphadenopathy, no easy bruising. Psychiatric: No confusion, no hallucinations, no sleep disturbance.    Physical Exam: Filed Vitals:   02/05/12 1352  BP: 136/79  Pulse: 76   the general appearance reveals a well-developed well-nourished late woman in no distress.The head and neck exam reveals pupils equal and reactive.  Extraocular movements are full.  There is no scleral icterus.  The mouth and pharynx are normal.  The neck is supple.  The carotids reveal no bruits.  The jugular venous pressure is normal.  The  thyroid is not enlarged.  There is no lymphadenopathy.  The chest is clear to percussion and auscultation.  There are no rales or rhonchi.  Expansion of the chest is symmetrical.  The precordium is quiet.  The first heart sound is normal.  The second heart sound is physiologically split.  There is no murmur gallop rub or click.  There is no abnormal lift or heave.  The abdomen is soft and nontender.  The bowel sounds are normal.  The liver and spleen are not enlarged.  There are no abdominal masses.  There are no abdominal bruits.  Extremities reveal good pedal pulses.  There is no phlebitis or edema.  There is no cyanosis or clubbing.  Strength is normal and symmetrical in all extremities.  There is no lateralizing weakness.  There are no sensory deficits.  The skin is warm and dry.  There is no rash.     Assessment / Plan: Continue same medication.  Check in 6 months for followup office visit and EKG.  We are checking a basal metabolic panel today to be sure she is on enough potassium

## 2012-02-12 NOTE — Progress Notes (Signed)
Quick Note:  Please report to patient. The recent labs are stable. Continue same medication and careful diet. Thyroid function is normal. Potassium is normal ______

## 2012-02-14 ENCOUNTER — Telehealth: Payer: Self-pay | Admitting: *Deleted

## 2012-02-14 NOTE — Telephone Encounter (Signed)
Advised patient of lab results  

## 2012-02-14 NOTE — Telephone Encounter (Signed)
Message copied by Burnell Blanks on Wed Feb 14, 2012  9:11 AM ------      Message from: Cassell Clement      Created: Mon Feb 12, 2012  8:45 AM       Please report to patient.  The recent labs are stable. Continue same medication and careful diet.  Thyroid function is normal.  Potassium is normal

## 2012-04-04 ENCOUNTER — Telehealth: Payer: Self-pay | Admitting: Cardiology

## 2012-04-04 NOTE — Telephone Encounter (Signed)
Patient found Rx's

## 2012-04-04 NOTE — Telephone Encounter (Signed)
New Problem:    Patient called in because she has misplaced her written prescriptions for her medications the she received from Dr. Patty Sermons at her last visit and needs to have new ones written.  Please call back.

## 2012-04-15 ENCOUNTER — Other Ambulatory Visit: Payer: Self-pay

## 2012-04-15 DIAGNOSIS — E876 Hypokalemia: Secondary | ICD-10-CM

## 2012-04-15 MED ORDER — POTASSIUM CHLORIDE CRYS ER 10 MEQ PO TBCR: 20.0000 meq | EXTENDED_RELEASE_TABLET | Freq: Two times a day (BID) | ORAL | Status: DC

## 2012-04-25 ENCOUNTER — Other Ambulatory Visit (HOSPITAL_COMMUNITY): Payer: Self-pay | Admitting: Gynecology

## 2012-04-25 DIAGNOSIS — Z1231 Encounter for screening mammogram for malignant neoplasm of breast: Secondary | ICD-10-CM

## 2012-04-30 ENCOUNTER — Other Ambulatory Visit: Payer: Self-pay | Admitting: Gynecology

## 2012-04-30 ENCOUNTER — Ambulatory Visit (HOSPITAL_COMMUNITY): Payer: Medicare Other

## 2012-05-02 ENCOUNTER — Telehealth: Payer: Self-pay | Admitting: Oncology

## 2012-05-02 NOTE — Telephone Encounter (Signed)
LVOM for pt to return call.  °

## 2012-05-07 ENCOUNTER — Ambulatory Visit (HOSPITAL_COMMUNITY)
Admission: RE | Admit: 2012-05-07 | Discharge: 2012-05-07 | Disposition: A | Discharge status: Home / Self Care | Payer: Medicare Other | Source: Ambulatory Visit | Attending: Gynecology | Admitting: Gynecology

## 2012-05-07 ENCOUNTER — Ambulatory Visit (HOSPITAL_COMMUNITY): Payer: Medicare Other

## 2012-05-07 DIAGNOSIS — Z1231 Encounter for screening mammogram for malignant neoplasm of breast: Secondary | ICD-10-CM | POA: Insufficient documentation

## 2012-05-16 ENCOUNTER — Other Ambulatory Visit: Payer: Self-pay | Admitting: Oncology

## 2012-05-16 ENCOUNTER — Encounter: Payer: Self-pay | Admitting: Oncology

## 2012-05-16 DIAGNOSIS — D699 Hemorrhagic condition, unspecified: Secondary | ICD-10-CM | POA: Insufficient documentation

## 2012-05-16 HISTORY — DX: Hemorrhagic condition, unspecified: D69.9

## 2012-05-17 ENCOUNTER — Telehealth: Payer: Self-pay | Admitting: Oncology

## 2012-05-17 NOTE — Telephone Encounter (Signed)
Called pt and left message regarding lab on 05/27/12

## 2012-05-27 ENCOUNTER — Other Ambulatory Visit (HOSPITAL_BASED_OUTPATIENT_CLINIC_OR_DEPARTMENT_OTHER): Payer: Medicare Other | Admitting: Lab

## 2012-05-27 ENCOUNTER — Ambulatory Visit: Payer: Medicare Other

## 2012-05-27 DIAGNOSIS — D699 Hemorrhagic condition, unspecified: Secondary | ICD-10-CM

## 2012-05-27 LAB — CBC WITH DIFFERENTIAL/PLATELET
BASO%: 0.5 % (ref 0.0–2.0)
Basophils Absolute: 0 10e3/uL (ref 0.0–0.1)
EOS%: 2.2 % (ref 0.0–7.0)
Eosinophils Absolute: 0.1 10e3/uL (ref 0.0–0.5)
HCT: 36.8 % (ref 34.8–46.6)
HGB: 12.7 g/dL (ref 11.6–15.9)
LYMPH%: 22.1 % (ref 14.0–49.7)
MCH: 32.7 pg (ref 25.1–34.0)
MCHC: 34.5 g/dL (ref 31.5–36.0)
MCV: 94.5 fL (ref 79.5–101.0)
MONO#: 0.5 10e3/uL (ref 0.1–0.9)
MONO%: 7.3 % (ref 0.0–14.0)
NEUT#: 4.3 10e3/uL (ref 1.5–6.5)
NEUT%: 67.9 % (ref 38.4–76.8)
Platelets: 210 10e3/uL (ref 145–400)
RBC: 3.9 10e6/uL (ref 3.70–5.45)
RDW: 12.9 % (ref 11.2–14.5)
WBC: 6.3 10e3/uL (ref 3.9–10.3)
lymph#: 1.4 10e3/uL (ref 0.9–3.3)

## 2012-05-27 LAB — MORPHOLOGY: PLT EST: ADEQUATE

## 2012-05-27 LAB — APTT: aPTT: 32.5 seconds (ref 24–37)

## 2012-05-27 LAB — PROTHROMBIN TIME
INR: 0.89
Prothrombin Time: 12 s (ref 11.6–15.2)

## 2012-05-27 LAB — CHCC SMEAR

## 2012-06-01 ENCOUNTER — Other Ambulatory Visit: Payer: Self-pay

## 2012-06-03 ENCOUNTER — Encounter: Payer: Medicare Other | Admitting: Oncology

## 2012-06-03 ENCOUNTER — Other Ambulatory Visit: Payer: Medicare Other | Admitting: Lab

## 2012-06-03 ENCOUNTER — Ambulatory Visit: Payer: Medicare Other

## 2012-06-05 LAB — VON WILLEBRAND PANEL
Ristocetin Co-factor, Plasma: 138 % (ref 42–200)
Von Willebrand Antigen, Plasma: 111 % (ref 50–217)

## 2012-06-05 LAB — FIBRINOGEN: Fibrinogen: 288 mg/dL (ref 204–475)

## 2012-06-10 ENCOUNTER — Ambulatory Visit (HOSPITAL_BASED_OUTPATIENT_CLINIC_OR_DEPARTMENT_OTHER): Payer: Medicare Other | Admitting: Oncology

## 2012-06-10 ENCOUNTER — Telehealth: Payer: Self-pay | Admitting: Oncology

## 2012-06-10 ENCOUNTER — Ambulatory Visit: Payer: Medicare Other

## 2012-06-10 ENCOUNTER — Other Ambulatory Visit: Payer: Medicare Other | Admitting: Lab

## 2012-06-10 VITALS — BP 136/66 | HR 89 | Temp 97.4°F | Resp 18 | Ht 64.0 in | Wt 158.4 lb

## 2012-06-10 DIAGNOSIS — M3501 Sicca syndrome with keratoconjunctivitis: Secondary | ICD-10-CM

## 2012-06-10 DIAGNOSIS — D699 Hemorrhagic condition, unspecified: Secondary | ICD-10-CM

## 2012-06-10 NOTE — Progress Notes (Signed)
New Patient Hematology-Oncology Evaluation   Susan Gillespie [id number] [birth year] 67 y.o. 06/10/2012  CC: Dr. 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 Bleeding problem  started in March of 2000 when she had a cervical laminectomy and developed a postoperative epidural hemorrhage requiring emergency reoperation. In 2002 she had her gallbladder removed. 2 days postoperatively she developed a large right flank hematoma. Following a sigmoid colectomy and incidental appendectomy done for diverticulitis in 2011, she didn't felt another large intra-abdominal hemorrhage and, per her history, had to be reexplored.. She has had a number of other surgical procedures over her lifetime. She had no problems with 2 pregnancies and had normal vaginal deliveries. She had a vaginal hysterectomy, anterior posterior repair, thumb joint replacement, operation on trigger fingers, tubal ligation, bunionectomy, and dental extractions as recently as one year ago with no excessive bleeding. She never had heavy menstrual cycles. She has always bruised easily and notes that this is worse when she takes nonsteroidals. She has degenerative arthritis and morning joint stiffness and has been taking aspirin and Celebrex for many years was not on these drugs at the time of the March 2000 surgery. She has no known liver disease. She drinks a moderate amount of alcohol but not on a daily basis. When she had her anterior posterior repair, her gynecologist told her that all the tissues were friable. 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 She had a bilateral blepharoplasty which also resulted in periorbital hematomas and is currently using Restasis eye drops with good results. There is no family history of any excessive bleeding. Her mother is still alive at age 57.she is a lung cancer survivor. She has thin skin and  bruises easily. Father had lung cancer but died of complications of a heart attack at age 17.  A 5 year old sister had a mild heart attack, a 46 year old brother had a major heart attack, a 37 year old brother is alive and well. None of her siblings have had any bleeding problems. She has a daughter aged 76 and a son aged 15 without bleeding problems.  I did a number of screening laboratory tests prior to today's visit. This shows a normal von Willebrand factor, normal factor XIII screen, normal PT, normal PTT, and a platelet count of 210,000. fibrinogen normal.  PMH: Past Medical History  Diagnosis Date  . Colon polyp   . Diverticulitis   . Headache   . Hyperlipidemia   . Arthritis   . Diabetes mellitus     Type II  . Family history of cardiovascular disease   . HTN (hypertension)   . IBS (irritable bowel syndrome)     followed by Dr. Kinnie Scales  . Hypertension   . IBS (irritable bowel syndrome)   . GERD (gastroesophageal reflux disease)   . Bleeding tendency 05/16/2012    Hx post op bleeding  Normal platelet count  No history of stomach ulcers, MI, hepatitis, yellow jaundice, malaria, thyroid trouble, seizure, stroke. She was never told she had inflammatory arthritis  Past Surgical History  Procedure Laterality Date  . Spine surgery      cervical fusion  . Colon surgery    . Abdominal hysterectomy    . Tonsillectomy    . Cholecystectomy    . Appendectomy    Left thumb joint replacement. Bilateral hand surgery on trigger fingers.  Allergies: Allergies  Allergen Reactions  . Daypro (Oxaprozin) Swelling  facial  . Lisinopril     cough  . Other Itching    narcotics  . Oxaprozin   . Simvastatin     REACTION: myalgias and fatigue    Medications:Xanax 0.25 mg at bedtime, aspirin 81 mg daily, Celebrex 200 mg daily, Zyrtec 10 mg daily, vitamin D 2000 units one tab daily, Nexium 40 mg daily, estradiol 0.075 mg/24-hour one patch to skin twice weekly, folic acid 1 mg daily, HCTZ  12.5 mg daily, Imodium when necessary, Lopressor 50 mg when necessary, potassium 10 mEq 2 tablets, Crestor 20 mg daily, Restasis eyedrops both eyes daily,trazodone 100 mg at bedtime when necessary sleep.   Social History: she is an Charity fundraiser in our office. 2 healthy children  reports that she has quit smoking 25 years ago.  She reports that she drinks about 2.4 ounces of alcohol per week. Usually wine. Occasional martini.  Family History: Family History  Problem Relation Age of Onset  . Hyperlipidemia Other   . Hypertension Other   . Cancer Other     lung, esophagus, stomach  . Stroke Other   . Heart disease Father   . Heart disease Sister   . Heart attack Sister   . Heart disease Brother   . Heart attack Brother     Review of Systems: Constitutional symptoms:no constitutional symptoms HEENT:no sore throat. Dry eyes see above. Respiratory: no cough or dyspnea Cardiovascular:  No chest pain or palpitations. She had an isolated episode of SVT in the past and takes when necessary Lopressor if she has recurrent palpitations.she sees Dr. Patty Sermons Gastrointestinal ROS: no abdominal pain or change in bowel habits, no hematochezia or melena.history of colon polyps-benign, last colonoscopy 2009 Dr. Kinnie Scales, she was told she has esophageal growths but nature of these is unclear. Genito-Urinary ROS: no hematuria Hematological and Lymphatic: Musculoskeletal:see above; in addition she gets frequent cramps in her fingershe Neurologic:no headache or change in vision Dermatologic:no rash or ecchymosis Remaining ROS negative.  Physical Exam: Blood pressure 136/66, pulse 89, temperature 97.4 F (36.3 C), temperature source Oral, resp. rate 18, height 5\' 4"  (1.626 m), weight 158 lb 6.4 oz (71.85 kg). Wt Readings from Last 3 Encounters:  06/10/12 158 lb 6.4 oz (71.85 kg)  02/05/12 158 lb (71.668 kg)  10/03/11 154 lb (69.854 kg)    General appearance:well-nourished Caucasian woman Head: normal Neck:  full range of motion Lymph nodes:no adenopathy Breasts: Lungs:clear to auscultation resonant to percussion Heart:regular rhythm no murmur Abdominal:soft, nontender, no mass, no organomegaly GU: Extremities:no edema, no calf tenderness Neurologic:mental status intact, PERRLA, optic disc sharp, vessels normal, motor strength 5 over 5, reflexes 1+ symmetric, Skin:    Lab Results: Lab Results  Component Value Date   WBC 6.3 05/27/2012   HGB 12.7 05/27/2012   HCT 36.8 05/27/2012   MCV 94.5 05/27/2012   PLT 210 05/27/2012     Chemistry      Component Value Date/Time   NA 138 02/05/2012 1444   K 3.7 02/05/2012 1444   CL 102 02/05/2012 1444   CO2 29 02/05/2012 1444   BUN 19 02/05/2012 1444   CREATININE 0.9 02/05/2012 1444      Component Value Date/Time   CALCIUM 10.0 02/05/2012 1444   ALKPHOS 47 09/12/2011 1946   AST 24 09/12/2011 1946   ALT 15 09/12/2011 1946   BILITOT 0.2* 09/12/2011 1946       Review of peripheral blood film:platelets appear normal in morphology and number   Impression and Plan: Significant postoperative  bleeding with normal coagulation screen as outlined above.  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 I would have her stop her aspirin and Celebrex for 2 weeks and then obtain platelet function studies. I told her we may need to do additional studies at [facility] with respect to electron microscopy on her platelets if we don't get an answer from the routine platelet function studies. If we do find a qualitative platelet defect, we might be able to treat her in the future preop and perioperatively with DDAVP which can partially override mild qualitative platelet defect.  An alternative possibility given her sicca symptoms and the surgeon's report that she had friable pelvic tissues would be that she has a vascular fragility/connective tissue disorder. I am going to screen her for Sjogren's disease and rheumatoid arthritis.      Levert Feinstein, MD 06/10/2012, 7:58 PM

## 2012-06-10 NOTE — Telephone Encounter (Signed)
Gave pt appt for lab on 3/3/ pt is PRN

## 2012-06-14 ENCOUNTER — Telehealth: Payer: Self-pay | Admitting: Oncology

## 2012-06-14 NOTE — Telephone Encounter (Signed)
Appt moved to 07/01/12 per MD

## 2012-06-15 DIAGNOSIS — I639 Cerebral infarction, unspecified: Secondary | ICD-10-CM

## 2012-06-15 HISTORY — DX: Cerebral infarction, unspecified: I63.9

## 2012-06-17 ENCOUNTER — Other Ambulatory Visit: Payer: Medicare Other | Admitting: Lab

## 2012-06-28 ENCOUNTER — Inpatient Hospital Stay (HOSPITAL_COMMUNITY)
Admission: EM | Admit: 2012-06-28 | Discharge: 2012-06-30 | DRG: 066 | Disposition: A | Discharge status: Home / Self Care | Payer: Medicare Other | Attending: Internal Medicine | Admitting: Internal Medicine

## 2012-06-28 ENCOUNTER — Encounter (HOSPITAL_COMMUNITY): Payer: Self-pay | Admitting: Nurse Practitioner

## 2012-06-28 ENCOUNTER — Emergency Department (HOSPITAL_COMMUNITY): Payer: Medicare Other

## 2012-06-28 DIAGNOSIS — D699 Hemorrhagic condition, unspecified: Secondary | ICD-10-CM

## 2012-06-28 DIAGNOSIS — Z9089 Acquired absence of other organs: Secondary | ICD-10-CM

## 2012-06-28 DIAGNOSIS — Z860100 Personal history of colon polyps, unspecified: Secondary | ICD-10-CM

## 2012-06-28 DIAGNOSIS — I10 Essential (primary) hypertension: Secondary | ICD-10-CM | POA: Diagnosis present

## 2012-06-28 DIAGNOSIS — E785 Hyperlipidemia, unspecified: Secondary | ICD-10-CM | POA: Diagnosis present

## 2012-06-28 DIAGNOSIS — K219 Gastro-esophageal reflux disease without esophagitis: Secondary | ICD-10-CM | POA: Diagnosis present

## 2012-06-28 DIAGNOSIS — R51 Headache: Secondary | ICD-10-CM

## 2012-06-28 DIAGNOSIS — H532 Diplopia: Secondary | ICD-10-CM | POA: Diagnosis present

## 2012-06-28 DIAGNOSIS — Z8719 Personal history of other diseases of the digestive system: Secondary | ICD-10-CM

## 2012-06-28 DIAGNOSIS — E119 Type 2 diabetes mellitus without complications: Secondary | ICD-10-CM

## 2012-06-28 DIAGNOSIS — R7303 Prediabetes: Secondary | ICD-10-CM | POA: Diagnosis present

## 2012-06-28 DIAGNOSIS — Z823 Family history of stroke: Secondary | ICD-10-CM

## 2012-06-28 DIAGNOSIS — Z79899 Other long term (current) drug therapy: Secondary | ICD-10-CM

## 2012-06-28 DIAGNOSIS — I6350 Cerebral infarction due to unspecified occlusion or stenosis of unspecified cerebral artery: Principal | ICD-10-CM | POA: Diagnosis present

## 2012-06-28 DIAGNOSIS — I119 Hypertensive heart disease without heart failure: Secondary | ICD-10-CM

## 2012-06-28 DIAGNOSIS — Z888 Allergy status to other drugs, medicaments and biological substances status: Secondary | ICD-10-CM

## 2012-06-28 DIAGNOSIS — I6529 Occlusion and stenosis of unspecified carotid artery: Secondary | ICD-10-CM | POA: Diagnosis present

## 2012-06-28 DIAGNOSIS — Z8249 Family history of ischemic heart disease and other diseases of the circulatory system: Secondary | ICD-10-CM

## 2012-06-28 DIAGNOSIS — Z885 Allergy status to narcotic agent status: Secondary | ICD-10-CM

## 2012-06-28 DIAGNOSIS — M129 Arthropathy, unspecified: Secondary | ICD-10-CM | POA: Diagnosis present

## 2012-06-28 DIAGNOSIS — K589 Irritable bowel syndrome without diarrhea: Secondary | ICD-10-CM | POA: Diagnosis present

## 2012-06-28 DIAGNOSIS — I639 Cerebral infarction, unspecified: Secondary | ICD-10-CM | POA: Diagnosis present

## 2012-06-28 DIAGNOSIS — Z981 Arthrodesis status: Secondary | ICD-10-CM

## 2012-06-28 DIAGNOSIS — Z7982 Long term (current) use of aspirin: Secondary | ICD-10-CM

## 2012-06-28 DIAGNOSIS — Z87891 Personal history of nicotine dependence: Secondary | ICD-10-CM

## 2012-06-28 DIAGNOSIS — Z90710 Acquired absence of both cervix and uterus: Secondary | ICD-10-CM

## 2012-06-28 DIAGNOSIS — Z9049 Acquired absence of other specified parts of digestive tract: Secondary | ICD-10-CM

## 2012-06-28 LAB — CBC WITH DIFFERENTIAL/PLATELET
Basophils Absolute: 0 10*3/uL (ref 0.0–0.1)
Basophils Relative: 1 % (ref 0–1)
Eosinophils Absolute: 0.2 10*3/uL (ref 0.0–0.7)
Eosinophils Relative: 4 % (ref 0–5)
HCT: 37 % (ref 36.0–46.0)
Lymphocytes Relative: 29 % (ref 12–46)
MCH: 31.8 pg (ref 26.0–34.0)
MCHC: 34.3 g/dL (ref 30.0–36.0)
MCV: 92.5 fL (ref 78.0–100.0)
Monocytes Absolute: 0.5 10*3/uL (ref 0.1–1.0)
Platelets: 262 10*3/uL (ref 150–400)
RDW: 12.3 % (ref 11.5–15.5)
WBC: 5.5 10*3/uL (ref 4.0–10.5)

## 2012-06-28 LAB — BASIC METABOLIC PANEL
BUN: 16 mg/dL (ref 6–23)
CO2: 28 mEq/L (ref 19–32)
Calcium: 10.2 mg/dL (ref 8.4–10.5)
Creatinine, Ser: 0.88 mg/dL (ref 0.50–1.10)

## 2012-06-28 MED ORDER — ASPIRIN 81 MG PO CHEW
324.0000 mg | CHEWABLE_TABLET | Freq: Once | ORAL | Status: AC
  Administered 2012-06-29: 324 mg via ORAL
  Filled 2012-06-28: qty 4

## 2012-06-28 MED ORDER — GADOBENATE DIMEGLUMINE 529 MG/ML IV SOLN
14.0000 mL | Freq: Once | INTRAVENOUS | Status: AC | PRN
  Administered 2012-06-28: 14 mL via INTRAVENOUS

## 2012-06-28 MED ORDER — ACETAMINOPHEN 325 MG PO TABS
650.0000 mg | ORAL_TABLET | Freq: Once | ORAL | Status: AC
  Administered 2012-06-28: 650 mg via ORAL
  Filled 2012-06-28: qty 2

## 2012-06-28 NOTE — ED Notes (Signed)
Urine labeled and on sink in room  

## 2012-06-28 NOTE — ED Notes (Addendum)
Pt stating that IV is no longer hurting

## 2012-06-28 NOTE — ED Notes (Signed)
Per Pt: Pt began having high blood pressure one year ago.  Pt has had episodes of dizziness (spinning) along with double-vision for about a year along along with high blood pressure.  Double-vision has occurred without dizziness x 4.  Today, pt became dizzy (spinning feeling) along with symptoms of double-vision and felt like she was going to faint.  A co-workers helped pt sit down.  Pt didn't lose consciousness.  No N/V/D or sweating associated with incident.

## 2012-06-28 NOTE — ED Provider Notes (Signed)
History     CSN: [id number]  Arrival date & time 06/28/12  1643   First MD Initiated Contact with Patient 06/28/12 1657      Chief Complaint  Patient presents with  . Dizziness  . Blurred Vision    (Consider location/radiation/quality/duration/timing/severity/associated sxs/prior treatment) HPI Comments: 67 year old female with a history of high blood pressure, diabetes, abnormal postoperative bleeding, currently being evaluated by hematology oncology for this problem who presents with recurrent acute in onset dizziness and double vision. She states that approximately 6 times in the last couple of years she has developed acute onset of vertigo with diplopia, this usually last less than 1 minute, today it became acute in onset and lasted 6 minutes. It was associated with a feeling of the room spinning but no nausea or vomiting. Her vision and diplopia which she describes as stacked and not side-by-side diplopia. 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 She denies chest pain, shortness of breath, fevers chills nausea vomiting diarrhea rashes swelling sore throat difficulty speaking or difficulty walking. When her symptoms resolved but totally went away at this time she feels back to herself other than a mild headache.  The history is provided by the patient.    Past Medical History  Diagnosis Date  . Colon polyp   . Diverticulitis   . Headache   . Hyperlipidemia   . Arthritis   . Family history of cardiovascular disease   . HTN (hypertension)   . IBS (irritable bowel syndrome)     followed by Dr. Kinnie Scales  . Hypertension   . IBS (irritable bowel syndrome)   . GERD (gastroesophageal reflux disease)   . Bleeding tendency 05/16/2012    Hx post op bleeding  Normal platelet count  . Diabetes mellitus     Type II: Pt states that she is not diabetic    Past Surgical History  Procedure Laterality Date  .  Spine surgery      cervical fusion  . Colon surgery    . Abdominal hysterectomy    . Tonsillectomy    . Cholecystectomy    . Appendectomy      Family History  Problem Relation Age of Onset  . Hyperlipidemia Other   . Hypertension Other   . Cancer Other     lung, esophagus, stomach  . Stroke Other   . Heart disease Father   . Heart disease Sister   . Heart attack Sister   . Heart disease Brother   . Heart attack Brother     History  Substance Use Topics  . Smoking status: Former Games developer  . Smokeless tobacco: Never Used  . Alcohol Use: 2.4 oz/week    4 Glasses of wine per week    OB History   Grav Para Term Preterm Abortions TAB SAB Ect Mult Living                  Review of Systems  All other systems reviewed and are negative.    Allergies  Daypro; Lisinopril; Other; Oxaprozin; and Simvastatin  Home Medications   Current Outpatient Rx  Name  Route  Sig  Dispense  Refill  . acetaminophen (TYLENOL) 650 MG CR tablet   Oral   Take 650 mg by mouth once.         . ALPRAZolam (XANAX) 0.5 MG tablet   Oral   Take 0.25 mg by mouth  at bedtime.         . benzonatate (TESSALON) 100 MG capsule   Oral   Take 100 mg by mouth 3 (three) times daily as needed for cough.         . Calcium Carbonate (CALCIUM 500 PO)   Oral   Take 1 tablet by mouth 2 (two) times daily.         . celecoxib (CELEBREX) 200 MG capsule   Oral   Take 200 mg by mouth daily.           . cetirizine (ZYRTEC) 10 MG tablet   Oral   Take 10 mg by mouth daily.           . Cholecalciferol (VITAMIN D3) 2000 UNITS TABS   Oral   Take 1 tablet by mouth daily.          Marland Kitchen esomeprazole (NEXIUM) 40 MG capsule   Oral   Take 40 mg by mouth at bedtime.          Marland Kitchen estradiol (VIVELLE-DOT) 0.075 MG/24HR   Transdermal   Place 0.5 patches onto the skin 2 (two) times a week. Wednesday and saturdays         . FOLIC ACID PO   Oral   Take by mouth.           . hydrochlorothiazide  (MICROZIDE) 12.5 MG capsule   Oral   Take 1 capsule (12.5 mg total) by mouth daily.   90 capsule   3   . Loperamide HCl (IMODIUM PO)   Oral   Take 1 tablet by mouth every morning.          . methocarbamol (ROBAXIN) 500 MG tablet   Oral   Take 500 mg by mouth 4 (four) times daily as needed (muscle spasms.).         Marland Kitchen potassium chloride (K-DUR,KLOR-CON) 10 MEQ tablet   Oral   Take 10 mEq by mouth 2 (two) times daily.         . rosuvastatin (CRESTOR) 20 MG tablet   Oral   Take 0.5 tablets (10 mg total) by mouth daily.   45 tablet   3   . traZODone (DESYREL) 100 MG tablet   Oral   Take 100 mg by mouth at bedtime.           . metoprolol (LOPRESSOR) 50 MG tablet   Oral   Take 50 mg by mouth as needed (SVT).           BP 124/51  Pulse 83  Temp(Src) 97.6 F (36.4 C) (Oral)  Resp 16  SpO2 96%  Physical Exam  Nursing note and vitals reviewed. Constitutional: She appears well-developed and well-nourished. No distress.  HENT:  Head: Normocephalic and atraumatic.  Mouth/Throat: Oropharynx is clear and moist. No oropharyngeal exudate.  Eyes: Conjunctivae and EOM are normal. Pupils are equal, round, and reactive to light. Right eye exhibits no discharge. Left eye exhibits no discharge. No scleral icterus.  Neck: Normal range of motion. Neck supple. No JVD present. No thyromegaly present.  Cardiovascular: Normal rate, regular rhythm, normal heart sounds and intact distal pulses.  Exam reveals no gallop and no friction rub.   No murmur heard. Pulmonary/Chest: Effort normal and breath sounds normal. No respiratory distress. She has no wheezes. She has no rales.  Abdominal: Soft. Bowel sounds are normal. She exhibits no distension and no mass. There is no tenderness.  Musculoskeletal: Normal range of motion. She exhibits  no edema and no tenderness.  Lymphadenopathy:    She has no cervical adenopathy.  Neurological: She is alert. Coordination normal.  The patient has  normal speech, normal memory, normal coordination of all 4 extremities without any limb ataxia. The patient has normal strength of all 4 extremities at the major muscle groups including shoulders, biceps, triceps, forearm, grips, quadriceps, hamstrings, calves. Normal sensation to light touch and pinprick of all 4 extremities and the trunk. No truncal ataxia, normal gait, cranial nerves III through XII are intact.  Normal peripheral visual fields. Normal extraocular movements, however on extraocular movement testing the patient developed some mild stacked diplopia at the extremes to the left and the right.  There is no change in her pupillary alignment or disconjugate gaze with this testing. Normal pupillary exam. No pronator drift  Skin: Skin is warm and dry. No rash noted. No erythema.  Psychiatric: She has a normal mood and affect. Her behavior is normal.    ED Course  Procedures (including critical care time)  Labs Reviewed  BASIC METABOLIC PANEL - Abnormal; Notable for the following:    Glucose, Bld 128 (*)    GFR calc non Af Amer 66 (*)    GFR calc Af Amer 76 (*)    All other components within normal limits  CBC WITH DIFFERENTIAL  APTT  PROTIME-INR   Mr Laqueta Jean Wo Contrast  06/28/2012  *RADIOLOGY REPORT*  Clinical Data: Vertigo.  Diplopia.  Hypertension.  MRI HEAD WITHOUT AND WITH CONTRAST  Technique:  Multiplanar, multiecho pulse sequences of the brain and surrounding structures were obtained according to standard protocol without and with intravenous contrast  Contrast: 14mL MULTIHANCE GADOBENATE DIMEGLUMINE 529 MG/ML IV SOLN  Comparison: None.  Findings: There are mild chronic small vessel changes within the pons.  There is a probable punctate acute infarction in the right dorsal pons that could explain the current symptoms.  There are a few old small vessel cerebellar infarctions.  The cerebral hemispheres show minimal small vessel change of the white matter. No cortical or large vessel  territory infarction.  No mass lesion, hemorrhage, hydrocephalus or extra-axial collection.  No abnormal contrast enhancement.  No pituitary mass.  There is mild mucosal inflammation of the paranasal sinuses.  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  Suspicion of a tiny microvascular infarction in the right dorsal pons that could possibly relate to dizziness or diplopia.   Original Report Authenticated By: Paulina Fusi, M.D.      1. Stroke       MDM  The patient is not appear to have inducible nystagmus, she has no diplopia or visual defects at rest only with truncal movement testing. 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 She did not fall, she did not injure her head, she feels back to baseline at this time other than mild headache. Laboratory workup and MRI ordered  As there would be concern for intracranial mass, hemorrhage, stroke, also would consider peripheral cranial nerve impingement. She denies tick bites, syphilis.    MRI reveals a small pontine stroke, discussed with neurologist who agrees with inpatient admission for further evaluation and risk stratification. The patient has multiple risk factors for stroke. Vital signs remained in a normal range, there is no fever, no  hypotension, no hypertension and no tachycardia. Discussed with Dr. Onalee Hua, agrees with admission    Vida Roller, MD 06/28/12 [phone]

## 2012-06-28 NOTE — ED Notes (Signed)
PT in MRI.

## 2012-06-29 ENCOUNTER — Observation Stay (HOSPITAL_COMMUNITY): Payer: Medicare Other

## 2012-06-29 DIAGNOSIS — I6350 Cerebral infarction due to unspecified occlusion or stenosis of unspecified cerebral artery: Secondary | ICD-10-CM

## 2012-06-29 DIAGNOSIS — D699 Hemorrhagic condition, unspecified: Secondary | ICD-10-CM

## 2012-06-29 DIAGNOSIS — I10 Essential (primary) hypertension: Secondary | ICD-10-CM

## 2012-06-29 DIAGNOSIS — I519 Heart disease, unspecified: Secondary | ICD-10-CM

## 2012-06-29 LAB — LIPID PANEL
Cholesterol: 156 mg/dL (ref 0–200)
Total CHOL/HDL Ratio: 2.4 RATIO
VLDL: 30 mg/dL (ref 0–40)

## 2012-06-29 LAB — PROTIME-INR: INR: 0.93 (ref 0.00–1.49)

## 2012-06-29 LAB — POCT I-STAT TROPONIN I: Troponin i, poc: 0 ng/mL (ref 0.00–0.08)

## 2012-06-29 MED ORDER — ASPIRIN 300 MG RE SUPP
300.0000 mg | Freq: Every day | RECTAL | Status: DC
  Filled 2012-06-29: qty 1

## 2012-06-29 MED ORDER — ASPIRIN 325 MG PO TABS
325.0000 mg | ORAL_TABLET | Freq: Every day | ORAL | Status: DC
  Administered 2012-06-29: 325 mg via ORAL
  Filled 2012-06-29: qty 1

## 2012-06-29 MED ORDER — ONDANSETRON HCL 4 MG/2ML IJ SOLN: 4.0000 mg | Freq: Three times a day (TID) | INTRAMUSCULAR | Status: AC | PRN

## 2012-06-29 MED ORDER — CLOPIDOGREL BISULFATE 75 MG PO TABS
75.0000 mg | ORAL_TABLET | Freq: Every day | ORAL | Status: DC
Start: 2012-06-30 — End: 2012-06-30
  Administered 2012-06-30: 75 mg via ORAL
  Filled 2012-06-29 (×2): qty 1

## 2012-06-29 MED ORDER — ATORVASTATIN CALCIUM 10 MG PO TABS
10.0000 mg | ORAL_TABLET | Freq: Once | ORAL | Status: AC
  Administered 2012-06-29: 10 mg via ORAL
  Filled 2012-06-29: qty 1

## 2012-06-29 MED ORDER — HYDROCHLOROTHIAZIDE 12.5 MG PO CAPS
12.5000 mg | ORAL_CAPSULE | Freq: Every day | ORAL | Status: DC
  Administered 2012-06-29 – 2012-06-30 (×2): 12.5 mg via ORAL
  Filled 2012-06-29 (×2): qty 1

## 2012-06-29 MED ORDER — ACETAMINOPHEN 325 MG PO TABS
650.0000 mg | ORAL_TABLET | ORAL | Status: DC | PRN
  Administered 2012-06-29 – 2012-06-30 (×2): 650 mg via ORAL
  Filled 2012-06-29 (×2): qty 2

## 2012-06-29 MED ORDER — ALPRAZOLAM 0.25 MG PO TABS
0.2500 mg | ORAL_TABLET | Freq: Every day | ORAL | Status: DC
  Administered 2012-06-29 (×2): 0.25 mg via ORAL
  Filled 2012-06-29 (×2): qty 1

## 2012-06-29 MED ORDER — ATORVASTATIN CALCIUM 10 MG PO TABS
10.0000 mg | ORAL_TABLET | Freq: Every day | ORAL | Status: DC
  Administered 2012-06-29: 10 mg via ORAL
  Filled 2012-06-29 (×2): qty 1

## 2012-06-29 NOTE — Progress Notes (Addendum)
*  PRELIMINARY RESULTS* Vascular Ultrasound Carotid Duplex (Doppler) has been completed.   The left internal carotid artery demonstrates elevated velocities, suggestive of <40% stenosis. The left external carotid artery exhibits elevated velocities, suggestive of stenosis. Vertebral arteries are patent with antegrade flow. The left vertebral artery has an atypical waveform; the left subclavian artery is patent with antegrade flow and brachial artery blood pressures are equal.  06/29/2012 9:22 AM Gertie Fey, RDMS, RDCS

## 2012-06-29 NOTE — Consult Note (Addendum)
Referring Physician: Janee Morn    Chief Complaint: Diplopia and dizziness  HPI: Susan Gillespie is an 67 y.o. female who for the past 2 months has had episodes of dizziness and blurred vision. Episodes would last about 30 seconds and resolve spontaneously.  She reports that she would have about 2 a month.  On the day of admission the patient had another episode that again started with dizziness but lasted longer and was accompanied by vertical diplopia.  This episode lasted about 5-6 minutes but again resolved spontaneously.  Patient was admitted at that time.  MRI has been performed and shows a pontine infarct.  Patient was taken off of ASA that she had been taking previously due to a work up that was in progress for post surgical bleeding.      Date last known well: 06/28/12 Time last known well: 1200 tPA Given: No: Presented outside time window, resolution of symptoms  Past Medical History  Diagnosis Date  . Colon polyp   . Diverticulitis   . Headache   . Hyperlipidemia   . Arthritis   . Family history of cardiovascular disease   . HTN (hypertension)   . IBS (irritable bowel syndrome)     followed by Dr. Kinnie Scales  . Hypertension   . IBS (irritable bowel syndrome)   . GERD (gastroesophageal reflux disease)   . Bleeding tendency 05/16/2012    Hx post op bleeding  Normal platelet count  . Diabetes mellitus     Type II: Pt states that she is not diabetic    Past Surgical History  Procedure Laterality Date  . Spine surgery      cervical fusion  . Colon surgery    . Abdominal hysterectomy    . Tonsillectomy    . Cholecystectomy    . Appendectomy      Family History  Problem Relation Age of Onset  . Hyperlipidemia Other   . Hypertension Other   . Cancer Other     lung, esophagus, stomach  . Stroke Other   . Heart disease Father   . Heart disease Sister   . Heart attack Sister   . Heart disease Brother   . Heart attack Brother    Social History:  reports that she has quit  smoking. She has never used smokeless tobacco. She reports that she drinks about 2.4 ounces of alcohol per week. She reports that she does not use illicit drugs.  Allergies:  Allergies  Allergen Reactions  . Daypro (Oxaprozin) Swelling    facial  . Lisinopril     cough  . Other Itching    narcotics  . Oxaprozin     Unknown.    . Simvastatin     REACTION: myalgias and fatigue    Medications:  I have reviewed the patient's current medications. Prior to Admission:  Prescriptions prior to admission  Medication Sig Dispense Refill  . acetaminophen (TYLENOL) 650 MG CR tablet Take 650 mg by mouth once.      . ALPRAZolam (XANAX) 0.5 MG tablet Take 0.25 mg by mouth at bedtime.      . benzonatate (TESSALON) 100 MG capsule Take 100 mg by mouth 3 (three) times daily as needed for cough.      . Calcium Carbonate (CALCIUM 500 PO) Take 1 tablet by mouth 2 (two) times daily.      . celecoxib (CELEBREX) 200 MG capsule Take 200 mg by mouth daily.        . cetirizine (ZYRTEC)  10 MG tablet Take 10 mg by mouth daily.        . Cholecalciferol (VITAMIN D3) 2000 UNITS TABS Take 1 tablet by mouth daily.       Marland Kitchen esomeprazole (NEXIUM) 40 MG capsule Take 40 mg by mouth at bedtime.       Marland Kitchen estradiol (VIVELLE-DOT) 0.075 MG/24HR Place 0.5 patches onto the skin 2 (two) times a week. Wednesday and saturdays      . FOLIC ACID PO Take by mouth.        . hydrochlorothiazide (MICROZIDE) 12.5 MG capsule Take 1 capsule (12.5 mg total) by mouth daily.  90 capsule  3  . Loperamide HCl (IMODIUM PO) Take 1 tablet by mouth every morning.       . methocarbamol (ROBAXIN) 500 MG tablet Take 500 mg by mouth 4 (four) times daily as needed (muscle spasms.).      Marland Kitchen potassium chloride (K-DUR,KLOR-CON) 10 MEQ tablet Take 10 mEq by mouth 2 (two) times daily.      . rosuvastatin (CRESTOR) 20 MG tablet Take 0.5 tablets (10 mg total) by mouth daily.  45 tablet  3  . traZODone (DESYREL) 100 MG tablet Take 100 mg by mouth at bedtime.         . metoprolol (LOPRESSOR) 50 MG tablet Take 50 mg by mouth as needed (SVT).       Scheduled: . ALPRAZolam  0.25 mg Oral QHS  . aspirin  300 mg Rectal Daily   Or  . aspirin  325 mg Oral Daily  . atorvastatin  10 mg Oral q1800  . hydrochlorothiazide  12.5 mg Oral Daily    ROS: History obtained from the patient  General ROS: negative for - chills, fatigue, fever, night sweats, weight gain or weight loss Psychological ROS: negative for - behavioral disorder, hallucinations, memory difficulties, mood swings or suicidal ideation Ophthalmic ROS: negative for - blurry vision, double vision, eye pain or loss of vision ENT ROS: negative for - epistaxis, nasal discharge, oral lesions, sore throat, tinnitus or vertigo Allergy and Immunology ROS: negative for - hives or itchy/watery eyes Hematological and Lymphatic ROS: negative for - bleeding problems, bruising or swollen lymph nodes Endocrine ROS: negative for - galactorrhea, hair pattern changes, polydipsia/polyuria or temperature intolerance Respiratory ROS: negative for - cough, hemoptysis, shortness of breath or wheezing Cardiovascular ROS: negative for - chest pain, dyspnea on exertion, edema or irregular heartbeat Gastrointestinal ROS: negative for - abdominal pain, diarrhea, hematemesis, nausea/vomiting or stool incontinence Genito-Urinary ROS: negative for - dysuria, hematuria, incontinence or urinary frequency/urgency Musculoskeletal ROS: negative for - joint swelling or muscular weakness Neurological ROS: as noted in HPI Dermatological ROS: negative for rash and skin lesion changes  Physical Examination: Blood pressure 125/63, pulse 77, temperature 98 F (36.7 C), temperature source Oral, resp. rate 18, height 5\' 3"  (1.6 m), weight 70.398 kg (155 lb 3.2 oz), SpO2 99.00%.  Neurologic Examination: Mental Status: Alert, oriented, thought content appropriate.  Speech fluent without evidence of aphasia.  Able to follow 3 step commands  without difficulty. Cranial Nerves: II: Discs flat bilaterally; Visual fields grossly normal, pupils equal, round, reactive to light and accommodation III,IV, VI: ptosis not present, extra-ocular motions intact bilaterally V,VII: smile symmetric, facial light touch sensation normal bilaterally VIII: hearing normal bilaterally IX,X: gag reflex present XI: bilateral shoulder shrug XII: midline tongue extension Motor: Right : Upper extremity   5/5    Left:     Upper extremity   5/5  Lower extremity  5/5     Lower extremity   5/5 Tone and bulk:normal tone throughout; no atrophy noted Sensory: Pinprick and light touch intact throughout, bilaterally Deep Tendon Reflexes: 2+ and symmetric throughout Plantars: Right: mute   Left: mute Cerebellar: normal finger-to-nose and normal heel-to-shin test Gait: normal gait and station CV: pulses palpable throughout   Laboratory Studies:  Basic Metabolic Panel:  Recent Labs Lab 06/28/12 1730  NA 135  K 3.7  CL 98  CO2 28  GLUCOSE 128*  BUN 16  CREATININE 0.88  CALCIUM 10.2    Liver Function Tests: No results found for this basename: AST, ALT, ALKPHOS, BILITOT, PROT, ALBUMIN,  in the last 168 hours No results found for this basename: LIPASE, AMYLASE,  in the last 168 hours No results found for this basename: AMMONIA,  in the last 168 hours  CBC:  Recent Labs Lab 06/28/12 1730  WBC 5.5  NEUTROABS 3.2  HGB 12.7  HCT 37.0  MCV 92.5  PLT 262    Cardiac Enzymes: No results found for this basename: CKTOTAL, CKMB, CKMBINDEX, TROPONINI,  in the last 168 hours  BNP: No components found with this basename: POCBNP,   CBG: No results found for this basename: GLUCAP,  in the last 168 hours  Microbiology: No results found for this or any previous visit.  Coagulation Studies:  Recent Labs  06/29/12 0005  LABPROT 12.4  INR 0.93    Urinalysis: No results found for this basename: COLORURINE, APPERANCEUR, LABSPEC, PHURINE,  GLUCOSEU, HGBUR, BILIRUBINUR, KETONESUR, PROTEINUR, UROBILINOGEN, NITRITE, LEUKOCYTESUR,  in the last 168 hours  Lipid Panel:    Component Value Date/Time   CHOL 156 06/29/2012 0523   TRIG 148 06/29/2012 0523   HDL 65 06/29/2012 0523   CHOLHDL 2.4 06/29/2012 0523   VLDL 30 06/29/2012 0523   LDLCALC 61 06/29/2012 0523    HgbA1C:  Lab Results  Component Value Date   HGBA1C 6.4 08/22/2010    Urine Drug Screen:   No results found for this basename: labopia, cocainscrnur, labbenz, amphetmu, thcu, labbarb    Alcohol Level: No results found for this basename: ETH,  in the last 168 hours   Imaging: Dg Chest 2 View  06/29/2012  *RADIOLOGY REPORT*  Clinical Data: Fever cough and congestion.  CHEST - 2 VIEW  Comparison: 12/02/2010 and 06/29/2005 radiographs  Findings: The cardiomediastinal silhouette is unremarkable. Mild peribronchial thickening is unchanged. There is no evidence of focal airspace disease, pulmonary edema, suspicious pulmonary nodule/mass, pleural effusion, or pneumothorax. No acute bony abnormalities are identified. Prior cervical surgery again noted.  Cholecystectomy clips again identified.  IMPRESSION: No evidence of active cardiopulmonary disease.   Original Report Authenticated By: Harmon Pier, M.D.    Mr Laqueta Jean ZO Contrast  06/28/2012  *RADIOLOGY REPORT*  Clinical Data: Vertigo.  Diplopia.  Hypertension.  MRI HEAD WITHOUT AND WITH CONTRAST  Technique:  Multiplanar, multiecho pulse sequences of the brain and surrounding structures were obtained according to standard protocol without and with intravenous contrast  Contrast: 14mL MULTIHANCE GADOBENATE DIMEGLUMINE 529 MG/ML IV SOLN  Comparison: None.  Findings: There are mild chronic small vessel changes within the pons.  There is a probable punctate acute infarction in the right dorsal pons that could explain the current symptoms.  There are a few old small vessel cerebellar infarctions.  The cerebral hemispheres show minimal small  vessel change of the white matter. No cortical or large vessel territory infarction.  No mass lesion, hemorrhage, hydrocephalus or extra-axial collection.  No abnormal contrast enhancement.  No pituitary mass.  There is mild mucosal inflammation of the paranasal sinuses.  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  Suspicion of a tiny microvascular infarction in the right dorsal pons that could possibly relate to dizziness or diplopia.   Original Report Authenticated By: Paulina Fusi, M.D.     Assessment: 67 y.o. female presenting with complaints of diplopia and dizziness.  MRI shows a pontine infarct.  Carotid doppler shows no hemodynamically significant stenosis.  Echocardiogram is unremarkable.  Posterior circulation has not been visualized.  Patient with multiple small vessel risk factors.    Stroke Risk Factors - diabetes mellitus, hyperlipidemia and hypertension  Plan: 1. MRA  of the brain without contrast 2. Since patient with multiple previous events on ASA would change to Plavix 75mg  daily.   3. Telemetry monitoring 4. Frequent neuro checks   Thana Farr, MD [facility] [phone] 06/29/2012, 1:28 PM

## 2012-06-29 NOTE — Progress Notes (Signed)
  Echocardiogram 2D Echocardiogram has been performed.  Susan Gillespie 06/29/2012, 8:20 AM

## 2012-06-29 NOTE — H&P (Signed)
PCP:   Sanda Linger, MD   Chief Complaint:  Double vision  HPI: 67 yo female with htn comes in with several episodes over the last month of dizzy/double visison that usually last 30 sec.  Today it lasted much longer at work where she got very unbalanced and dizzy. 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  No fevers/n/v/d.  No cp/sob.  No n/t anywhere.  No loss of strength anywhere.  No rashes.  She has been having episodes of double vision dating back over 10 years ago.  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  Her asa and celbrex were stopped last month by hematology as she is being w/u for platelet dysfunction as she has had several post op bleeding complications in past.  Review of Systems:  Positive and negative as per HPI otherwise all other systems are negative  Past Medical History: Past Medical History  Diagnosis Date  . Colon polyp   . Diverticulitis   . Headache   . Hyperlipidemia   . Arthritis   . Family history of cardiovascular disease   . HTN (hypertension)   . IBS (irritable bowel syndrome)     followed by Dr. Kinnie Scales  . Hypertension   . IBS (irritable bowel syndrome)   . GERD (gastroesophageal reflux disease)   . Bleeding tendency 05/16/2012    Hx post op bleeding  Normal platelet count  . Diabetes mellitus     Type II: Pt states that she is not diabetic   Past Surgical History  Procedure Laterality Date  . Spine surgery      cervical fusion  . Colon surgery    . Abdominal hysterectomy    . Tonsillectomy    . Cholecystectomy    . Appendectomy      Medications: Prior to Admission medications   Medication Sig Start Date End Date Taking? Authorizing Provider  acetaminophen (TYLENOL) 650 MG CR tablet Take 650 mg by mouth once.   Yes Historical Provider, MD  ALPRAZolam Prudy Feeler) 0.5 MG tablet Take 0.25 mg by mouth at bedtime.   Yes Historical Provider, MD  benzonatate (TESSALON) 100 MG capsule Take 100 mg by mouth 3 (three) times daily as needed  for cough.   Yes Historical Provider, MD  Calcium Carbonate (CALCIUM 500 PO) Take 1 tablet by mouth 2 (two) times daily.   Yes Historical Provider, MD  celecoxib (CELEBREX) 200 MG capsule Take 200 mg by mouth daily.     Yes Historical Provider, MD  cetirizine (ZYRTEC) 10 MG tablet Take 10 mg by mouth daily.     Yes Historical Provider, MD  Cholecalciferol (VITAMIN D3) 2000 UNITS TABS Take 1 tablet by mouth daily.    Yes Historical Provider, MD  esomeprazole (NEXIUM) 40 MG capsule Take 40 mg by mouth at bedtime.    Yes Historical Provider, MD  estradiol (VIVELLE-DOT) 0.075 MG/24HR Place 0.5 patches onto the skin 2 (two) times a week. Wednesday and saturdays   Yes Historical Provider, MD  FOLIC ACID PO Take by mouth.     Yes Historical Provider, MD  hydrochlorothiazide (MICROZIDE) 12.5 MG capsule Take 1 capsule (12.5 mg total) by mouth daily. 02/05/12 02/04/13 Yes Cassell Clement, MD  Loperamide HCl (IMODIUM PO) Take 1 tablet by mouth every morning.    Yes Historical Provider, MD  methocarbamol (ROBAXIN) 500 MG tablet Take 500 mg by mouth 4 (four) times daily as needed (muscle spasms.).   Yes Historical Provider, MD  potassium chloride (K-DUR,KLOR-CON)  10 MEQ tablet Take 10 mEq by mouth 2 (two) times daily. 04/15/12 04/15/13 Yes Cassell Clement, MD  rosuvastatin (CRESTOR) 20 MG tablet Take 0.5 tablets (10 mg total) by mouth daily. 02/05/12  Yes Cassell Clement, MD  traZODone (DESYREL) 100 MG tablet Take 100 mg by mouth at bedtime.     Yes Historical Provider, MD  metoprolol (LOPRESSOR) 50 MG tablet Take 50 mg by mouth as needed (SVT). 09/25/11   Cassell Clement, MD    Allergies:   Allergies  Allergen Reactions  . Daypro (Oxaprozin) Swelling    facial  . Lisinopril     cough  . Other Itching    narcotics  . Oxaprozin     Unknown.    . Simvastatin     REACTION: myalgias and fatigue    Social History:  reports that she has quit smoking. She has never used smokeless tobacco. She  reports that she drinks about 2.4 ounces of alcohol per week. She reports that she does not use illicit drugs.  Family History: Family History  Problem Relation Age of Onset  . Hyperlipidemia Other   . Hypertension Other   . Cancer Other     lung, esophagus, stomach  . Stroke Other   . Heart disease Father   . Heart disease Sister   . Heart attack Sister   . Heart disease Brother   . Heart attack Brother     Physical Exam: Filed Vitals:   06/28/12 1659 06/28/12 2240  BP: 124/51   Pulse: 83   Temp: 97.9 F (36.6 C) 97.6 F (36.4 C)  TempSrc: Oral   Resp: 16   SpO2: 96%    General appearance: alert, cooperative and no distress Head: Normocephalic, without obvious abnormality, atraumatic Eyes: negative Nose: Nares normal. Septum midline. Mucosa normal. No drainage or sinus tenderness. Neck: no JVD and supple, symmetrical, trachea midline Lungs: clear to auscultation bilaterally Heart: regular rate and rhythm, S1, S2 normal, no murmur, click, rub or gallop Abdomen: soft, non-tender; bowel sounds normal; no masses,  no organomegaly Extremities: extremities normal, atraumatic, no cyanosis or edema Pulses: 2+ and symmetric Skin: Skin color, texture, turgor normal. No rashes or lesions Neurologic: Grossly normal    Labs on Admission:   Recent Labs  06/28/12 1730  NA 135  K 3.7  CL 98  CO2 28  GLUCOSE 128*  BUN 16  CREATININE 0.88  CALCIUM 10.2    Recent Labs  06/28/12 1730  WBC 5.5  NEUTROABS 3.2  HGB 12.7  HCT 37.0  MCV 92.5  PLT 262   Radiological Exams on Admission: Mr Laqueta Jean ZO Contrast  06/28/2012  *RADIOLOGY REPORT*  Clinical Data: Vertigo.  Diplopia.  Hypertension.  MRI HEAD WITHOUT AND WITH CONTRAST  Technique:  Multiplanar, multiecho pulse sequences of the brain and surrounding structures were obtained according to standard protocol without and with intravenous contrast  Contrast: 14mL MULTIHANCE GADOBENATE DIMEGLUMINE 529 MG/ML IV SOLN   Comparison: None.  Findings: There are mild chronic small vessel changes within the pons.  There is a probable punctate acute infarction in the right dorsal pons that could explain the current symptoms.  There are a few old small vessel cerebellar infarctions.  The cerebral hemispheres show minimal small vessel change of the white matter. No cortical or large vessel territory infarction.  No mass lesion, hemorrhage, hydrocephalus or extra-axial collection.  No abnormal contrast enhancement.  No pituitary mass.  There is mild mucosal inflammation of the paranasal sinuses.  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  Suspicion of a tiny microvascular infarction in the right dorsal pons that could possibly relate to dizziness or diplopia.   Original Report Authenticated By: Paulina Fusi, M.D.     Assessment/Plan 67 yo female with tia/cva  Principal Problem:   Diplopia Active Problems:   DIABETES MELLITUS, TYPE II   HYPERLIPIDEMIA   Essential hypertension, benign   CVA (cerebral infarction)  Will order factor 8, coags.  (unsure what labs she is suppose to get Monday with heme cannot find in epic).  Place on asa.  cva pathway.  Neuro to see in am.  ekg nsr.  obs on tele floor.  Diane Hanel A 06/29/2012, 12:25 AM

## 2012-06-29 NOTE — Progress Notes (Signed)
I have seen and assessed patient and agree with Dr Kermit Balo assessment and plan. 2 d Echo pending. Carotid dopplers pending. Patient with clinical improvement. MRI c/w small CVA. Patient was being worked up for qualitative platelet disorder secondary to hx of significant postop bleeding. Patient has been placed on ASA. Neuro consult pending. Follow.

## 2012-06-30 DIAGNOSIS — E119 Type 2 diabetes mellitus without complications: Secondary | ICD-10-CM

## 2012-06-30 DIAGNOSIS — H532 Diplopia: Secondary | ICD-10-CM

## 2012-06-30 DIAGNOSIS — I119 Hypertensive heart disease without heart failure: Secondary | ICD-10-CM

## 2012-06-30 LAB — BASIC METABOLIC PANEL
BUN: 13 mg/dL (ref 6–23)
Chloride: 98 mEq/L (ref 96–112)
GFR calc Af Amer: 81 mL/min — ABNORMAL LOW (ref >90)
GFR calc non Af Amer: 70 mL/min — ABNORMAL LOW (ref >90)
Potassium: 3.7 mEq/L (ref 3.5–5.1)

## 2012-06-30 MED ORDER — HYDROCODONE-ACETAMINOPHEN 5-325 MG PO TABS
1.0000 | ORAL_TABLET | Freq: Once | ORAL | Status: AC
  Administered 2012-06-30: 1 via ORAL
  Filled 2012-06-30: qty 1

## 2012-06-30 MED ORDER — CLOPIDOGREL BISULFATE 75 MG PO TABS: 75.0000 mg | ORAL_TABLET | Freq: Every day | ORAL | Status: DC

## 2012-06-30 NOTE — Discharge Summary (Signed)
Physician Discharge Summary  Susan Gillespie ZOX:[id number] DOB: 26-Aug-1945 DOA: 06/28/2012  PCP: Sanda Linger, MD  Admit date: 06/28/2012 Discharge date: 06/30/2012  Time spent: 70 minutes  Recommendations for Outpatient Follow-up:  1. Patient is to followup with PCP with 1-2 weeks post discharge. 2. Patient is to followup with Dr. Pearlean Brownie of [facility] neurology in one month. 3. Patient is to followup with Dr. Cyndie Chime as scheduled.  Discharge Diagnoses:  Principal Problem:   CVA (cerebral infarction) Active Problems:   DIABETES MELLITUS, TYPE II   HYPERLIPIDEMIA   Essential hypertension, benign   Diplopia   Discharge Condition: Stable and improved  Diet recommendation: Carb modified  Filed Weights   06/29/12 0045  Weight: 70.398 kg (155 lb 3.2 oz)    History of present illness:  67 yo female with htn comes in with several episodes over the last month of dizzy/double visison that usually last 30 sec. Today it lasted much longer at work where she got very unbalanced and dizzy. 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 No fevers/n/v/d. No cp/sob. No n/t anywhere. No loss of strength anywhere. No rashes. She has been having episodes of double vision dating back over 10 years ago. 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 Her asa and celbrex were stopped last month by hematology as she is being w/u for platelet dysfunction as she has had several post op bleeding complications in past.    Hospital Course:  #1 acute pontine infarct/CVA Patient had presented with episode of dizziness and vertigo diplopia which lasted longer approximately 5-6 minutes but then resolved spontaneously . Patient had been having episodes of dizziness and blurred vision that lasted 30 seconds and resolve spontaneously over the past 2 months. Patient was admitted to the telemetry floor. TPA was not administered due to rapid resolution of symptoms. Patient on presentation had recently been taken off  aspirin and Celebrex due to workup for postsurgical bleeding. Patient on admission was given 4 baby aspirin and placed on aspirin daily. MRI of the head which was done did show a pontine infarct. Patient underwent stroke workup which included carotid Dopplers which had less than 40% stenosis. Patient had a 2-D echo done which was negative for any source of emboli. Patient had MRA of the head which was unremarkable. A neurology consultation was obtained and patient was seen in consultation by Dr. Thad Ranger on 06/29/2012. Due to patient's multiple previous events on aspirin patient's aspirin was subsequently changed to Plavix 75 mg daily. Patient tolerated to Plavix. Fasting lipid panel which was also obtained came back with the LDL of 61. Patient improved clinically did not have any further episodes of dizziness or diplopia and will be discharged home in stable and improved condition on Plavix 75 mg daily. Patient will need to followup with Dr. Pearlean Brownie on [facility] neurology 1 month post discharge.  #2 hyperlipidemia Fasting lipid panel which was done during that presentation came back with the LDL of 61. Patient was maintained on a statin.  #3 hypertension Remained stable throughout the hospitalization. Patient was maintained on a home regimen.  The rest of patient's chronic medical issues remained stable throughout the hospitalization patient be discharged in stable and improved condition.   Procedures:  Chest x-ray 06/29/2012  2-D echo 06/29/2012  Carotid Dopplers 06/29/2012  MRI of the head 06/28/2012  MRA of the head 06/29/2012  Consultations:  Neurology Dr. Thad Ranger 06/29/2012  Discharge Exam: Filed Vitals:   06/29/12 1130 06/29/12 1352 06/29/12 2202 06/30/12  0458  BP: 125/63 133/59 134/72 128/53  Pulse: 77 79 66 66  Temp: 98 F (36.7 C) 98.9 F (37.2 C) 97.7 F (36.5 C) 98 F (36.7 C)  TempSrc: Oral Oral Oral Oral  Resp: 18 18 18 16   Height:      Weight:      SpO2: 99% 97%  98% 96%    General: NAD Cardiovascular: RRR Respiratory: CTAB  Discharge Instructions      Discharge Orders   Future Appointments Provider Department Dept Phone   07/01/2012 8:30 AM Mauri [place] [facility] [phone]   Future Orders Complete By Expires     Diet Carb Modified  As directed     Discharge instructions  As directed     Comments:      fOLLOW UP WITH Sanda Linger, MD IN 1-2 WEEK Follow up with Dr Pearlean Brownie in 1 month    Increase activity slowly  As directed         Medication List    STOP taking these medications       celecoxib 200 MG capsule  Commonly known as:  CELEBREX      TAKE these medications       acetaminophen 650 MG CR tablet  Commonly known as:  TYLENOL  Take 650 mg by mouth once.     ALPRAZolam 0.5 MG tablet  Commonly known as:  XANAX  Take 0.25 mg by mouth at bedtime.     benzonatate 100 MG capsule  Commonly known as:  TESSALON  Take 100 mg by mouth 3 (three) times daily as needed for cough.     CALCIUM 500 PO  Take 1 tablet by mouth 2 (two) times daily.     cetirizine 10 MG tablet  Commonly known as:  ZYRTEC  Take 10 mg by mouth daily.     clopidogrel 75 MG tablet  Commonly known as:  PLAVIX  Take 1 tablet (75 mg total) by mouth daily with breakfast.     esomeprazole 40 MG capsule  Commonly known as:  NEXIUM  Take 40 mg by mouth at bedtime.     estradiol 0.075 MG/24HR  Commonly known as:  VIVELLE-DOT  Place 0.5 patches onto the skin 2 (two) times a week. Wednesday and saturdays     FOLIC ACID PO  Take by mouth.     hydrochlorothiazide 12.5 MG capsule  Commonly known as:  MICROZIDE  Take 1 capsule (12.5 mg total) by mouth daily.     IMODIUM PO  Take 1 tablet by mouth every morning.     methocarbamol 500 MG tablet  Commonly known as:  ROBAXIN  Take 500 mg by mouth 4 (four) times daily as needed (muscle spasms.).     metoprolol 50 MG tablet  Commonly known as:  LOPRESSOR  Take 50 mg  by mouth as needed (SVT).     potassium chloride 10 MEQ tablet  Commonly known as:  K-DUR,KLOR-CON  Take 10 mEq by mouth 2 (two) times daily.     rosuvastatin 20 MG tablet  Commonly known as:  CRESTOR  Take 0.5 tablets (10 mg total) by mouth daily.     traZODone 100 MG tablet  Commonly known as:  DESYREL  Take 100 mg by mouth at bedtime.     Vitamin D3 2000 UNITS Tabs  Take 1 tablet by mouth daily.       Follow-up Information   Follow up with Sanda Linger, MD. Schedule  an appointment as soon as possible for a visit in 1 week.   Contact information:   [address] [place] Ripper [place] [address] [phone]       Follow up with Gates Rigg, MD. Schedule an appointment as soon as possible for a visit in 1 month.   Contact information:   [address], [address] [facility] [place] [address] [phone]        The results of significant diagnostics from this hospitalization (including imaging, microbiology, ancillary and laboratory) are listed below for reference.    Significant Diagnostic Studies: Dg Chest 2 View  06/29/2012  *RADIOLOGY REPORT*  Clinical Data: Fever cough and congestion.  CHEST - 2 VIEW  Comparison: 12/02/2010 and 06/29/2005 radiographs  Findings: The cardiomediastinal silhouette is unremarkable. Mild peribronchial thickening is unchanged. There is no evidence of focal airspace disease, pulmonary edema, suspicious pulmonary nodule/mass, pleural effusion, or pneumothorax. No acute bony abnormalities are identified. Prior cervical surgery again noted.  Cholecystectomy clips again identified.  IMPRESSION: No evidence of active cardiopulmonary disease.   Original Report Authenticated By: Harmon Pier, M.D.    Mr [facility] Contrast  06/29/2012  *RADIOLOGY REPORT*  Clinical Data: Stroke.  Vertigo.  Diplopia.  MRA HEAD WITHOUT CONTRAST  Technique: Angiographic images of the Circle of Willis were obtained using MRA  technique without intravenous contrast.  Comparison: MRI 06/28/2012  Findings: Both internal carotid arteries are widely patent into the brain.  No siphon stenosis.  The anterior and middle cerebral vessels are patent without proximal stenosis, aneurysm or vascular malformation.  Both vertebral arteries are patent to the basilar.  No distal vertebral stenosis.  Both posterior inferior cerebellar arteries are patent.  No vertebral stenosis.  Superior cerebellar and posterior cerebral arteries are patent.  There are posterior communicating arteries bilaterally.  IMPRESSION: Normal intracranial MR angiography of the large and medium-sized vessels.   Original Report Authenticated By: Paulina Fusi, M.D.    Mr Laqueta Jean BM Contrast  06/28/2012  *RADIOLOGY REPORT*  Clinical Data: Vertigo.  Diplopia.  Hypertension.  MRI HEAD WITHOUT AND WITH CONTRAST  Technique:  Multiplanar, multiecho pulse sequences of the brain and surrounding structures were obtained according to standard protocol without and with intravenous contrast  Contrast: 14mL MULTIHANCE GADOBENATE DIMEGLUMINE 529 MG/ML IV SOLN  Comparison: None.  Findings: There are mild chronic small vessel changes within the pons.  There is a probable punctate acute infarction in the right dorsal pons that could explain the current symptoms.  There are a few old small vessel cerebellar infarctions.  The cerebral hemispheres show minimal small vessel change of the white matter. No cortical or large vessel territory infarction.  No mass lesion, hemorrhage, hydrocephalus or extra-axial collection.  No abnormal contrast enhancement.  No pituitary mass.  There is mild mucosal inflammation of the paranasal sinuses.  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  Suspicion of a tiny microvascular infarction in the right dorsal pons that could possibly relate to dizziness or diplopia.   Original Report Authenticated By: Paulina Fusi,  M.D.     Microbiology: No results found for this or any previous visit (from the past 240 hour(s)).   Labs: Basic Metabolic Panel:  Recent Labs Lab 06/28/12 1730 06/30/12 0805  NA 135 136  K 3.7 3.7  CL 98 98  CO2 28 31  GLUCOSE 128* 115*  BUN 16 13  CREATININE 0.88 0.84  CALCIUM 10.2 10.1   Liver Function Tests: No results found for this basename: AST, ALT, ALKPHOS, BILITOT, PROT, ALBUMIN,  in the last 168 hours No results found for this basename: LIPASE, AMYLASE,  in the last 168 hours No results found for this basename: AMMONIA,  in the last 168 hours CBC:  Recent Labs Lab 06/28/12 1730  WBC 5.5  NEUTROABS 3.2  HGB 12.7  HCT 37.0  MCV 92.5  PLT 262   Cardiac Enzymes: No results found for this basename: CKTOTAL, CKMB, CKMBINDEX, TROPONINI,  in the last 168 hours BNP: BNP (last 3 results) No results found for this basename: PROBNP,  in the last 8760 hours CBG: No results found for this basename: GLUCAP,  in the last 168 hours     Signed:  THOMPSON,DANIEL  [facility] 06/30/2012, 11:14 AM

## 2012-06-30 NOTE — Evaluation (Signed)
Occupational Therapy Evaluation Patient Details Name: MARISELDA BADALAMENTI MRN: [id number] DOB: July 26, 1945 Today's Date: 06/30/2012 Time: 4098-1191 OT Time Calculation (min): 21 min  OT Assessment / Plan / Recommendation Clinical Impression  Pt admitted with dizziness and diplopia earlier in the day.  MRI confirmed small pontine CVA.  Pt currently with no symptoms of dizziness or diplopia.  AROM, strength, balance, all WNLs.  Pt with no apparent deficits with selfcare or ADLs at this time.  Will sign off for OT services.    OT Assessment  Patient does not need any further OT services    Follow Up Recommendations  No OT follow up       Equipment Recommendations  None recommended by OT          Precautions / Restrictions Precautions Precautions: None Restrictions Weight Bearing Restrictions: No   Pertinent Vitals/Pain Pt with slight headache    ADL  Eating/Feeding: Simulated;Independent Where Assessed - Eating/Feeding: Edge of bed Grooming: Simulated;Independent Where Assessed - Grooming: Unsupported standing Upper Body Bathing: Simulated;Independent Where Assessed - Upper Body Bathing: Unsupported standing Lower Body Bathing: Simulated;Independent Where Assessed - Lower Body Bathing: Unsupported standing Upper Body Dressing: Simulated;Independent Where Assessed - Upper Body Dressing: Unsupported sitting Lower Body Dressing: Simulated;Independent Where Assessed - Lower Body Dressing: Unsupported sit to stand Toilet Transfer: Performed Toilet Transfer Method: Other (comment) (ambulating without assistive device) Toilet Transfer Equipment: Regular height toilet Toileting - Clothing Manipulation and Hygiene: Independent Tub/Shower Transfer: Simulated;Independent Tub/Shower Transfer Method: Ambulating Equipment Used: Gait belt Transfers/Ambulation Related to ADLs: Pt is independent with mobility. ADL Comments: Pt is back to baseline functionally which is independent.  No current  diplopia or dizziness.     Visit Information  Last OT Received On: 06/30/12 Assistance Needed: +1    Subjective Data  Subjective: I don't have any problems now but I was dizzy and having some double vision when this happened. Patient Stated Goal: To be as independent as possible   Prior Functioning     Home Living Lives With: Significant other Type of Home: House Home Access: Stairs to enter [employer] of Steps: 5 Entrance Stairs-Rails: Can reach both Home Layout: Two level;Able to live on main level with bedroom/bathroom;Full bath on main level Alternate Level Stairs-Number of Steps: 16 with a landing Alternate Level Stairs-Rails: Can reach both Bathroom Shower/Tub: Engineer, manufacturing systems: Standard Bathroom Accessibility: No Home Adaptive Equipment: None Prior Function Level of Independence: Independent Able to Take Stairs?: Reciprically Driving: Yes Vocation: Retired Musician: No difficulties Dominant Hand: Right         Vision/Perception Vision - History Baseline Vision: Wears glasses all the time Patient Visual Report: No change from baseline Vision - Assessment Eye Alignment: Within Functional Limits Vision Assessment: Vision tested Ocular Range of Motion: Within Functional Limits Alignment/Gaze Preference: Within Defined Limits Tracking/Visual Pursuits: Other (comment) (slight minimal jerkiness with horizontal tracking.) Visual Fields: No apparent deficits Perception Perception: Within Functional Limits Praxis Praxis: Intact   Cognition  Cognition Overall Cognitive Status: Appears within functional limits for tasks assessed/performed Arousal/Alertness: Awake/alert Orientation Level: Appears intact for tasks assessed Behavior During Session: [facility] for tasks performed    Extremity/Trunk Assessment Right Upper Extremity Assessment RUE ROM/Strength/Tone: Within functional levels RUE Sensation: WFL - Light  Touch RUE Coordination: WFL - gross/fine motor Left Upper Extremity Assessment LUE ROM/Strength/Tone: Within functional levels LUE Sensation: WFL - Light Touch LUE Coordination: WFL - gross/fine motor Right Lower Extremity Assessment RLE ROM/Strength/Tone: Within functional levels RLE Sensation: WFL -  Light Touch RLE Coordination: WFL - gross/fine motor Left Lower Extremity Assessment LLE ROM/Strength/Tone: Within functional levels LLE Sensation: WFL - Light Touch LLE Coordination: WFL - gross/fine motor Trunk Assessment Trunk Assessment: Normal     Mobility Bed Mobility Bed Mobility: Not assessed Transfers Transfers: Sit to Stand Sit to Stand: 7: Independent;Without upper extremity assist;From toilet Stand to Sit: 7: Independent;To toilet        Balance Balance Balance Assessed: Yes Dynamic Standing Balance Dynamic Standing - Level of Assistance: 7: Independent Dynamic Standing - Balance Activities: Reaching for objects;Forward lean/weight shifting;Reaching across midline Dynamic Standing - Comments: pt able to turn 360*, turn head while walking, stand with feet together, step over 12" object on floor, bend to floor all without LOB   End of Session OT - End of Session Equipment Utilized During Treatment: Gait belt Activity Tolerance: Patient tolerated treatment well Patient left: in bed;with family/visitor present     Ramil Edgington  OTR/L Pager number [phone] 06/30/2012, 12:32 PM

## 2012-06-30 NOTE — Evaluation (Signed)
Physical Therapy Evaluation Patient Details Name: Susan Gillespie MRN: [id number] DOB: 1945-05-11 Today's Date: 06/30/2012 Time: 1308-6578 PT Time Calculation (min): 15 min  PT Assessment / Plan / Recommendation Clinical Impression  67 y.o. female admitted with dizziness and double vision, Dx of pontine infarct. Pt now with resolved symptoms. She is independent with mobility, no further PT needs.     PT Assessment  Patent does not need any further PT services    Follow Up Recommendations  No PT follow up    Does the patient have the potential to tolerate intense rehabilitation      Barriers to Discharge        Equipment Recommendations       Recommendations for Other Services     Frequency      Precautions / Restrictions     Pertinent Vitals/Pain *0/10**      Mobility  Bed Mobility Bed Mobility: Not assessed Transfers Transfers: Sit to Stand;Stand to Sit Sit to Stand: From bed;7: Independent Stand to Sit: To bed;7: Independent Ambulation/Gait Ambulation/Gait Assistance: 7: Independent Ambulation Distance (Feet): 400 Feet Assistive device: None Gait Pattern: Within Functional Limits Stairs: Yes Stairs Assistance: 6: Modified independent (Device/Increase time) Stair Management Technique: One rail Right Number of Stairs: 16    Exercises     PT Diagnosis:    PT Problem List:   PT Treatment Interventions:     PT Goals    Visit Information  Last PT Received On: 06/30/12 Assistance Needed: +1    Subjective Data  Subjective: I feel ok now, I'm just not used to being in bed.  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   Prior Functioning  Home Living Lives With: Significant other Type of Home: House Home Access: Stairs to enter [employer] of Steps: 5 Entrance Stairs-Rails: Can reach both Home Layout: Two level;Able to live on main level with bedroom/bathroom;Full bath on main level Alternate Level Stairs-Number of  Steps: 16 with a landing Alternate Level Stairs-Rails: Can reach both Bathroom Shower/Tub: Engineer, manufacturing systems: Standard Bathroom Accessibility: No Home Adaptive Equipment: None Prior Function Level of Independence: Independent Able to Take Stairs?: Reciprically Driving: Yes Vocation: Retired Musician: No difficulties Dominant Hand: Right    Cognition  Cognition Overall Cognitive Status: Appears within functional limits for tasks assessed/performed Arousal/Alertness: Awake/alert Orientation Level: Appears intact for tasks assessed Behavior During Session: [facility] for tasks performed    Extremity/Trunk Assessment Right Upper Extremity Assessment RUE ROM/Strength/Tone: [facility] for tasks assessed Left Upper Extremity Assessment LUE ROM/Strength/Tone: WFL for tasks assessed Right Lower Extremity Assessment RLE ROM/Strength/Tone: Within functional levels RLE Sensation: WFL - Light Touch RLE Coordination: WFL - gross/fine motor Left Lower Extremity Assessment LLE ROM/Strength/Tone: Within functional levels LLE Sensation: WFL - Light Touch LLE Coordination: WFL - gross/fine motor Trunk Assessment Trunk Assessment: Normal   Balance Balance Balance Assessed: Yes Dynamic Standing Balance Dynamic Standing - Balance Activities: Reaching for objects;Forward lean/weight shifting Dynamic Standing - Comments: pt able to turn 360*, turn head while walking, stand with feet together, step over 12" object on floor, bend to floor all without LOB  End of Session PT - End of Session Equipment Utilized During Treatment: Gait belt Activity Tolerance: Patient tolerated treatment well Patient left: in bed;with call bell/phone within reach;with family/visitor present Nurse Communication: Mobility status  GP     Ralene Bathe Kistler 06/30/2012, 12:17 PM [phone]

## 2012-07-01 ENCOUNTER — Other Ambulatory Visit: Payer: Medicare Other | Admitting: Lab

## 2012-07-05 ENCOUNTER — Ambulatory Visit (INDEPENDENT_AMBULATORY_CARE_PROVIDER_SITE_OTHER)
Admission: RE | Admit: 2012-07-05 | Discharge: 2012-07-05 | Disposition: A | Discharge status: Home / Self Care | Payer: Medicare Other | Source: Ambulatory Visit | Attending: Internal Medicine | Admitting: Internal Medicine

## 2012-07-05 ENCOUNTER — Encounter: Payer: Self-pay | Admitting: Internal Medicine

## 2012-07-05 ENCOUNTER — Ambulatory Visit (INDEPENDENT_AMBULATORY_CARE_PROVIDER_SITE_OTHER): Payer: Medicare Other | Admitting: Internal Medicine

## 2012-07-05 VITALS — BP 128/82 | HR 71 | Temp 97.6°F | Resp 16 | Wt 156.8 lb

## 2012-07-05 DIAGNOSIS — R937 Abnormal findings on diagnostic imaging of other parts of musculoskeletal system: Secondary | ICD-10-CM

## 2012-07-05 DIAGNOSIS — M545 Low back pain, unspecified: Secondary | ICD-10-CM

## 2012-07-05 DIAGNOSIS — M79605 Pain in left leg: Secondary | ICD-10-CM

## 2012-07-05 DIAGNOSIS — K219 Gastro-esophageal reflux disease without esophagitis: Secondary | ICD-10-CM

## 2012-07-05 DIAGNOSIS — I10 Essential (primary) hypertension: Secondary | ICD-10-CM

## 2012-07-05 DIAGNOSIS — E119 Type 2 diabetes mellitus without complications: Secondary | ICD-10-CM

## 2012-07-05 DIAGNOSIS — I119 Hypertensive heart disease without heart failure: Secondary | ICD-10-CM

## 2012-07-05 DIAGNOSIS — M48061 Spinal stenosis, lumbar region without neurogenic claudication: Secondary | ICD-10-CM | POA: Insufficient documentation

## 2012-07-05 DIAGNOSIS — I639 Cerebral infarction, unspecified: Secondary | ICD-10-CM

## 2012-07-05 DIAGNOSIS — I6350 Cerebral infarction due to unspecified occlusion or stenosis of unspecified cerebral artery: Secondary | ICD-10-CM

## 2012-07-05 DIAGNOSIS — E785 Hyperlipidemia, unspecified: Secondary | ICD-10-CM

## 2012-07-05 LAB — VON WILLEBRAND PANEL
Coagulation Factor VIII: 108 % (ref 73–140)
Ristocetin Co-factor, Plasma: 122 % (ref 42–200)
Von Willebrand Antigen, Plasma: 113 % (ref 50–217)

## 2012-07-05 MED ORDER — CLOPIDOGREL BISULFATE 75 MG PO TABS: 75.0000 mg | ORAL_TABLET | Freq: Every day | ORAL | Status: DC

## 2012-07-05 MED ORDER — OLMESARTAN MEDOXOMIL 20 MG PO TABS: 20.0000 mg | ORAL_TABLET | Freq: Every day | ORAL | Status: DC

## 2012-07-05 MED ORDER — FAMOTIDINE 40 MG PO TABS: 40.0000 mg | ORAL_TABLET | Freq: Every day | ORAL | Status: DC

## 2012-07-05 MED ORDER — TRAMADOL HCL 50 MG PO TABS: 50.0000 mg | ORAL_TABLET | Freq: Three times a day (TID) | ORAL | Status: DC | PRN

## 2012-07-05 NOTE — Patient Instructions (Signed)
Back Pain, Adult Low back pain is very common. About 1 in 5 people have back pain.The cause of low back pain is rarely dangerous. The pain often gets better over time.About half of people with a sudden onset of back pain feel better in just 2 weeks. About 8 in 10 people feel better by 6 weeks.  CAUSES Some common causes of back pain include:  Strain of the muscles or ligaments supporting the spine.  Wear and tear (degeneration) of the spinal discs.  Arthritis.  Direct injury to the back. DIAGNOSIS Most of the time, the direct cause of low back pain is not known.However, back pain can be treated effectively even when the exact cause of the pain is unknown.Answering your caregiver's questions about your overall health and symptoms is one of the most accurate ways to make sure the cause of your pain is not dangerous. If your caregiver needs more information, he or she may order lab work or imaging tests (X-rays or MRIs).However, even if imaging tests show changes in your back, this usually does not require surgery. HOME CARE INSTRUCTIONS For many people, back pain returns.Since low back pain is rarely dangerous, it is often a condition that people can learn to manageon their own.   Remain active. It is stressful on the back to sit or stand in one place. Do not sit, drive, or stand in one place for more than 30 minutes at a time. Take short walks on level surfaces as soon as pain allows.Try to increase the length of time you walk each day.  Do not stay in bed.Resting more than 1 or 2 days can delay your recovery.  Do not avoid exercise or work.Your body is made to move.It is not dangerous to be active, even though your back may hurt.Your back will likely heal faster if you return to being active before your pain is gone.  Pay attention to your body when you bend and lift. Many people have less discomfortwhen lifting if they bend their knees, keep the load close to their bodies,and  avoid twisting. Often, the most comfortable positions are those that put less stress on your recovering back.  Find a comfortable position to sleep. Use a firm mattress and lie on your side with your knees slightly bent. If you lie on your back, put a pillow under your knees.  Only take over-the-counter or prescription medicines as directed by your caregiver. Over-the-counter medicines to reduce pain and inflammation are often the most helpful.Your caregiver may prescribe muscle relaxant drugs.These medicines help dull your pain so you can more quickly return to your normal activities and healthy exercise.  Put ice on the injured area.  Put ice in a plastic bag.  Place a towel between your skin and the bag.  Leave the ice on for 15 to 20 minutes, 3 to 4 times a day for the first 2 to 3 days. After that, ice and heat may be alternated to reduce pain and spasms.  Ask your caregiver about trying back exercises and gentle massage. This may be of some benefit.  Avoid feeling anxious or stressed.Stress increases muscle tension and can worsen back pain.It is important to recognize when you are anxious or stressed and learn ways to manage it.Exercise is a great option. SEEK MEDICAL CARE IF:  You have pain that is not relieved with rest or medicine.  You have pain that does not improve in 1 week.  You have new symptoms.  You are generally   not feeling well. SEEK IMMEDIATE MEDICAL CARE IF:   You have pain that radiates from your back into your legs.  You develop new bowel or bladder control problems.  You have unusual weakness or numbness in your arms or legs.  You develop nausea or vomiting.  You develop abdominal pain.  You feel faint. Document Released: 04/03/2005 Document Revised: 10/03/2011 Document Reviewed: 08/22/2010 Exit[employer] Patient Information 2013 [employer].  

## 2012-07-05 NOTE — Assessment & Plan Note (Signed)
I have ordered an MRI of her lumbar spine

## 2012-07-05 NOTE — Assessment & Plan Note (Signed)
Stop estrogen Start Benicar Continue statin, plavix

## 2012-07-05 NOTE — Progress Notes (Signed)
Subjective:    Patient ID: Susan Gillespie, female    DOB: Sep 18, 1945, 67 y.o.   MRN: [id number]  Back Pain This is a recurrent problem. Episode onset: 4-5 months. The problem occurs intermittently. The problem has been gradually worsening since onset. The pain is present in the lumbar spine. The quality of the pain is described as aching and stabbing. The pain radiates to the left thigh and right thigh. The pain is at a severity of 4/10. The pain is moderate. The pain is worse during the night. The symptoms are aggravated by bending, position and twisting. Stiffness is present at night. Associated symptoms include leg pain. Pertinent negatives include no abdominal pain, bladder incontinence, bowel incontinence, chest pain, dysuria, fever, headaches, numbness, paresis, paresthesias, pelvic pain, perianal numbness, tingling, weakness or weight loss. Risk factors include obesity and lack of exercise. She has tried NSAIDs, muscle relaxant and analgesics for the symptoms. The treatment provided mild relief.      Review of Systems  Constitutional: Negative.  Negative for fever, chills, weight loss, diaphoresis, activity change, appetite change, fatigue and unexpected weight change.  HENT: Negative.   Eyes: Negative.  Negative for photophobia and visual disturbance.  Respiratory: Negative.  Negative for cough, chest tightness, shortness of breath, wheezing and stridor.   Cardiovascular: Negative.  Negative for chest pain, palpitations and leg swelling.  Gastrointestinal: Negative.  Negative for abdominal pain and bowel incontinence.  Endocrine: Negative.   Genitourinary: Negative.  Negative for bladder incontinence, dysuria, difficulty urinating and pelvic pain.  Musculoskeletal: Positive for back pain. Negative for myalgias, joint swelling, arthralgias and gait problem.  Skin: Negative.  Negative for color change, pallor, rash and wound.  Allergic/Immunologic: Negative.   Neurological: Negative.   Negative for dizziness, tingling, tremors, weakness, light-headedness, numbness, headaches and paresthesias.  Hematological: Negative for adenopathy. Does not bruise/bleed easily.  Psychiatric/Behavioral: Negative.        Objective:   Physical Exam  Vitals reviewed. Constitutional: She is oriented to person, place, and time. She appears well-developed and well-nourished. No distress.  HENT:  Head: Normocephalic and atraumatic.  Mouth/Throat: Oropharynx is clear and moist. No oropharyngeal exudate.  Eyes: Conjunctivae are normal. Right eye exhibits no discharge. Left eye exhibits no discharge. No scleral icterus.  Neck: Normal range of motion. Neck supple. No JVD present. No tracheal deviation present. No thyromegaly present.  Cardiovascular: Normal rate, regular rhythm, normal heart sounds and intact distal pulses.  Exam reveals no gallop and no friction rub.   No murmur heard. Pulmonary/Chest: Effort normal and breath sounds normal. No stridor. No respiratory distress. She has no wheezes. She has no rales. She exhibits no tenderness.  Abdominal: Soft. Bowel sounds are normal. She exhibits no distension and no mass. There is no tenderness. There is no rebound and no guarding.  Musculoskeletal: Normal range of motion. She exhibits no edema and no tenderness.       Lumbar back: Normal. She exhibits no tenderness, no bony tenderness and no swelling.  Lymphadenopathy:    She has no cervical adenopathy.  Neurological: She is alert and oriented to person, place, and time. She has normal strength. She displays no atrophy, no tremor and normal reflexes. No cranial nerve deficit or sensory deficit. She exhibits normal muscle tone. She displays a negative Romberg sign. She displays no seizure activity. Coordination and gait normal.  Reflex Scores:      Tricep reflexes are 1+ on the right side and 1+ on the left side.  Bicep reflexes are 1+ on the right side and 1+ on the left side.       Brachioradialis reflexes are 1+ on the right side and 1+ on the left side.      Patellar reflexes are 1+ on the right side and 1+ on the left side.      Achilles reflexes are 1+ on the right side and 1+ on the left side. - SLR in BLE  Skin: Skin is warm and dry. No rash noted. She is not diaphoretic. No erythema. No pallor.  Psychiatric: She has a normal mood and affect. Her behavior is normal. Judgment and thought content normal.     Lab Results  Component Value Date   WBC 5.5 06/28/2012   HGB 12.7 06/28/2012   HCT 37.0 06/28/2012   PLT 262 06/28/2012   GLUCOSE 115* 06/30/2012   CHOL 156 06/29/2012   TRIG 148 06/29/2012   HDL 65 06/29/2012   LDLCALC 61 06/29/2012   ALT 15 09/12/2011   AST 24 09/12/2011   NA 136 06/30/2012   K 3.7 06/30/2012   CL 98 06/30/2012   CREATININE 0.84 06/30/2012   BUN 13 06/30/2012   CO2 31 06/30/2012   TSH 1.09 02/05/2012   INR 0.93 06/29/2012   HGBA1C 6.1* 06/29/2012  Dg Chest 2 View  06/29/2012  *RADIOLOGY REPORT*  Clinical Data: Fever cough and congestion.  CHEST - 2 VIEW  Comparison: 12/02/2010 and 06/29/2005 radiographs  Findings: The cardiomediastinal silhouette is unremarkable. Mild peribronchial thickening is unchanged. There is no evidence of focal airspace disease, pulmonary edema, suspicious pulmonary nodule/mass, pleural effusion, or pneumothorax. No acute bony abnormalities are identified. Prior cervical surgery again noted.  Cholecystectomy clips again identified.  IMPRESSION: No evidence of active cardiopulmonary disease.   Original Report Authenticated By: Harmon Pier, M.D.    Mr [facility] Contrast  06/29/2012  *RADIOLOGY REPORT*  Clinical Data: Stroke.  Vertigo.  Diplopia.  MRA HEAD WITHOUT CONTRAST  Technique: Angiographic images of the Circle of Willis were obtained using MRA technique without intravenous contrast.  Comparison: MRI 06/28/2012  Findings: Both internal carotid arteries are widely patent into the brain.  No siphon stenosis.  The anterior  and middle cerebral vessels are patent without proximal stenosis, aneurysm or vascular malformation.  Both vertebral arteries are patent to the basilar.  No distal vertebral stenosis.  Both posterior inferior cerebellar arteries are patent.  No vertebral stenosis.  Superior cerebellar and posterior cerebral arteries are patent.  There are posterior communicating arteries bilaterally.  IMPRESSION: Normal intracranial MR angiography of the large and medium-sized vessels.   Original Report Authenticated By: Paulina Fusi, M.D.    Mr [facility] Contrast  06/28/2012  *RADIOLOGY REPORT*  Clinical Data: Vertigo.  Diplopia.  Hypertension.  MRI HEAD WITHOUT AND WITH CONTRAST  Technique:  Multiplanar, multiecho pulse sequences of the brain and surrounding structures were obtained according to standard protocol without and with intravenous contrast  Contrast: 14mL MULTIHANCE GADOBENATE DIMEGLUMINE 529 MG/ML IV SOLN  Comparison: None.  Findings: There are mild chronic small vessel changes within the pons.  There is a probable punctate acute infarction in the right dorsal pons that could explain the current symptoms.  There are a few old small vessel cerebellar infarctions.  The cerebral hemispheres show minimal small vessel change of the white matter. No cortical or large vessel territory infarction.  No mass lesion, hemorrhage, hydrocephalus or extra-axial collection.  No abnormal contrast enhancement.  No pituitary mass.  There is mild mucosal inflammation of the paranasal sinuses.  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  Suspicion of a tiny microvascular infarction in the right dorsal pons that could possibly relate to dizziness or diplopia.   Original Report Authenticated By: Paulina Fusi, M.D.       Assessment & Plan:

## 2012-07-05 NOTE — Assessment & Plan Note (Signed)
Stop nexium due to plavix Start pepcid

## 2012-07-05 NOTE — Assessment & Plan Note (Signed)
Continue crestor 

## 2012-07-05 NOTE — Assessment & Plan Note (Signed)
I think an ARB is a better choice than a diuretic for BP control and reduction of CVA risk

## 2012-07-05 NOTE — Assessment & Plan Note (Signed)
Her plain xray is abnormal so I am concerned about spinal stenosis, nerve impingement, etc -----> MRI ordered She will continue her current meds and will try tramadol as well for pain relief

## 2012-07-05 NOTE — Assessment & Plan Note (Signed)
Her last a1c was in the normal range

## 2012-07-05 NOTE — Assessment & Plan Note (Signed)
Start Benicar 

## 2012-07-08 ENCOUNTER — Other Ambulatory Visit: Payer: Medicare Other

## 2012-07-09 ENCOUNTER — Ambulatory Visit
Admission: RE | Admit: 2012-07-09 | Discharge: 2012-07-09 | Disposition: A | Discharge status: Home / Self Care | Payer: Medicare Other | Source: Ambulatory Visit | Attending: Internal Medicine | Admitting: Internal Medicine

## 2012-07-09 DIAGNOSIS — M545 Low back pain, unspecified: Secondary | ICD-10-CM

## 2012-07-09 DIAGNOSIS — R937 Abnormal findings on diagnostic imaging of other parts of musculoskeletal system: Secondary | ICD-10-CM

## 2012-07-10 ENCOUNTER — Encounter: Payer: Self-pay | Admitting: Internal Medicine

## 2012-07-29 ENCOUNTER — Other Ambulatory Visit: Payer: Self-pay | Admitting: Internal Medicine

## 2012-07-29 ENCOUNTER — Encounter: Payer: Self-pay | Admitting: Internal Medicine

## 2012-07-29 DIAGNOSIS — M545 Low back pain, unspecified: Secondary | ICD-10-CM

## 2012-07-29 DIAGNOSIS — I639 Cerebral infarction, unspecified: Secondary | ICD-10-CM

## 2012-07-29 MED ORDER — METHOCARBAMOL 500 MG PO TABS: 500.0000 mg | ORAL_TABLET | Freq: Three times a day (TID) | ORAL | Status: DC

## 2012-07-29 MED ORDER — CLOPIDOGREL BISULFATE 75 MG PO TABS: 75.0000 mg | ORAL_TABLET | Freq: Every day | ORAL | Status: DC

## 2012-08-06 ENCOUNTER — Encounter (HOSPITAL_COMMUNITY): Payer: Self-pay | Admitting: *Deleted

## 2012-08-06 ENCOUNTER — Emergency Department (HOSPITAL_COMMUNITY): Payer: Medicare Other

## 2012-08-06 ENCOUNTER — Emergency Department (HOSPITAL_COMMUNITY)
Admission: EM | Admit: 2012-08-06 | Discharge: 2012-08-06 | Disposition: A | Discharge status: Home / Self Care | Payer: Medicare Other | Attending: Emergency Medicine | Admitting: Emergency Medicine

## 2012-08-06 ENCOUNTER — Encounter: Payer: Self-pay | Admitting: Internal Medicine

## 2012-08-06 DIAGNOSIS — Z87891 Personal history of nicotine dependence: Secondary | ICD-10-CM | POA: Insufficient documentation

## 2012-08-06 DIAGNOSIS — E785 Hyperlipidemia, unspecified: Secondary | ICD-10-CM | POA: Insufficient documentation

## 2012-08-06 DIAGNOSIS — E119 Type 2 diabetes mellitus without complications: Secondary | ICD-10-CM | POA: Insufficient documentation

## 2012-08-06 DIAGNOSIS — D699 Hemorrhagic condition, unspecified: Secondary | ICD-10-CM | POA: Insufficient documentation

## 2012-08-06 DIAGNOSIS — M129 Arthropathy, unspecified: Secondary | ICD-10-CM | POA: Insufficient documentation

## 2012-08-06 DIAGNOSIS — Z791 Long term (current) use of non-steroidal anti-inflammatories (NSAID): Secondary | ICD-10-CM | POA: Insufficient documentation

## 2012-08-06 DIAGNOSIS — M4802 Spinal stenosis, cervical region: Secondary | ICD-10-CM

## 2012-08-06 DIAGNOSIS — K589 Irritable bowel syndrome without diarrhea: Secondary | ICD-10-CM | POA: Insufficient documentation

## 2012-08-06 DIAGNOSIS — K219 Gastro-esophageal reflux disease without esophagitis: Secondary | ICD-10-CM | POA: Insufficient documentation

## 2012-08-06 DIAGNOSIS — M542 Cervicalgia: Secondary | ICD-10-CM | POA: Insufficient documentation

## 2012-08-06 DIAGNOSIS — H8110 Benign paroxysmal vertigo, unspecified ear: Secondary | ICD-10-CM | POA: Insufficient documentation

## 2012-08-06 DIAGNOSIS — R51 Headache: Secondary | ICD-10-CM | POA: Insufficient documentation

## 2012-08-06 DIAGNOSIS — R42 Dizziness and giddiness: Secondary | ICD-10-CM

## 2012-08-06 DIAGNOSIS — Z79899 Other long term (current) drug therapy: Secondary | ICD-10-CM | POA: Insufficient documentation

## 2012-08-06 DIAGNOSIS — Z8673 Personal history of transient ischemic attack (TIA), and cerebral infarction without residual deficits: Secondary | ICD-10-CM | POA: Insufficient documentation

## 2012-08-06 DIAGNOSIS — I10 Essential (primary) hypertension: Secondary | ICD-10-CM | POA: Insufficient documentation

## 2012-08-06 DIAGNOSIS — Z8679 Personal history of other diseases of the circulatory system: Secondary | ICD-10-CM | POA: Insufficient documentation

## 2012-08-06 LAB — URINALYSIS, ROUTINE W REFLEX MICROSCOPIC
Bilirubin Urine: NEGATIVE
Glucose, UA: NEGATIVE mg/dL
Hgb urine dipstick: NEGATIVE
Ketones, ur: NEGATIVE mg/dL
Protein, ur: NEGATIVE mg/dL

## 2012-08-06 LAB — COMPREHENSIVE METABOLIC PANEL
AST: 16 U/L (ref 0–37)
Albumin: 3.8 g/dL (ref 3.5–5.2)
Alkaline Phosphatase: 58 U/L (ref 39–117)
CO2: 28 mEq/L (ref 19–32)
Chloride: 103 mEq/L (ref 96–112)
GFR calc non Af Amer: 66 mL/min — ABNORMAL LOW (ref >90)
Potassium: 4.4 mEq/L (ref 3.5–5.1)
Total Bilirubin: 0.4 mg/dL (ref 0.3–1.2)

## 2012-08-06 LAB — CBC WITH DIFFERENTIAL/PLATELET
Basophils Absolute: 0 10*3/uL (ref 0.0–0.1)
Basophils Relative: 1 % (ref 0–1)
Hemoglobin: 12.8 g/dL (ref 12.0–15.0)
Lymphocytes Relative: 16 % (ref 12–46)
MCHC: 34.8 g/dL (ref 30.0–36.0)
Monocytes Relative: 6 % (ref 3–12)
Neutro Abs: 4.7 10*3/uL (ref 1.7–7.7)
Neutrophils Relative %: 77 % (ref 43–77)
RDW: 12.8 % (ref 11.5–15.5)
WBC: 6.1 10*3/uL (ref 4.0–10.5)

## 2012-08-06 MED ORDER — DIAZEPAM 5 MG/ML IJ SOLN
2.5000 mg | Freq: Once | INTRAMUSCULAR | Status: AC
  Administered 2012-08-06: 2.5 mg via INTRAVENOUS
  Filled 2012-08-06: qty 2

## 2012-08-06 MED ORDER — MECLIZINE HCL 25 MG PO TABS: 25.0000 mg | ORAL_TABLET | Freq: Three times a day (TID) | ORAL | Status: DC | PRN

## 2012-08-06 MED ORDER — SODIUM CHLORIDE 0.9 % IV SOLN
INTRAVENOUS | Status: DC
  Administered 2012-08-06: 20:00:00 via INTRAVENOUS

## 2012-08-06 MED ORDER — MECLIZINE HCL 25 MG PO TABS
25.0000 mg | ORAL_TABLET | Freq: Once | ORAL | Status: AC
  Administered 2012-08-06: 25 mg via ORAL
  Filled 2012-08-06: qty 1

## 2012-08-06 NOTE — ED Notes (Addendum)
Pt reports dizziness while standing and walking around. Pt has hx of stroke March 14th. Pt has neck pain 6/10 No neuro deficits noted in triage. Reports symptoms started this morning when she woke up.

## 2012-08-06 NOTE — ED Provider Notes (Signed)
History     CSN: [id number]  Arrival date & time 08/06/12  1743   First MD Initiated Contact with Patient 08/06/12 1901      Chief Complaint  Patient presents with  . Dizziness    (Consider location/radiation/quality/duration/timing/severity/associated sxs/prior treatment) HPI Comments: Patient comes to the ER for evaluation of dizziness. Patient reports that the symptoms began when she woke this morning. When she got out of bed she noticed that the room is spinning. She had trouble ambulating, had to hold onto furniture to get to the bathroom. Symptoms have been persistent through the course of the day. Patient is concerned because she has had a recent stroke. 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 She does, however, report that today's dizziness is different. She has not had a headache. She has not had any recent illness or upper respiratory infection. She denies hearing loss and ringing in her ears.   Past Medical History  Diagnosis Date  . Colon polyp   . Diverticulitis   . Headache   . Hyperlipidemia   . Arthritis   . Family history of cardiovascular disease   . HTN (hypertension)   . IBS (irritable bowel syndrome)     followed by Dr. Kinnie Scales  . Hypertension   . IBS (irritable bowel syndrome)   . GERD (gastroesophageal reflux disease)   . Bleeding tendency 05/16/2012    Hx post op bleeding  Normal platelet count  . Diabetes mellitus     Type II: Pt states that she is not diabetic    Past Surgical History  Procedure Laterality Date  . Spine surgery      cervical fusion  . Colon surgery    . Abdominal hysterectomy    . Tonsillectomy    . Cholecystectomy    . Appendectomy      Family History  Problem Relation Age of Onset  . Hyperlipidemia Other   . Hypertension Other   . Cancer Other     lung, esophagus, stomach  . Stroke Other   . Heart disease Father   . Heart disease Sister   . Heart attack Sister   .  Heart disease Brother   . Heart attack Brother     History  Substance Use Topics  . Smoking status: Former Games developer  . Smokeless tobacco: Never Used  . Alcohol Use: 2.4 oz/week    4 Glasses of wine per week    OB History   Grav Para Term Preterm Abortions TAB SAB Ect Mult Living                  Review of Systems  Eyes: Negative for visual disturbance.  Neurological: Positive for dizziness. Negative for headaches.  All other systems reviewed and are negative.    Allergies  Daypro; Lisinopril; Other; Oxaprozin; and Simvastatin  Home Medications   Current Outpatient Rx  Name  Route  Sig  Dispense  Refill  . acetaminophen (TYLENOL) 650 MG CR tablet   Oral   Take 650 mg by mouth once.         . ALPRAZolam (XANAX) 0.5 MG tablet   Oral   Take 0.25 mg by mouth at bedtime.         . Calcium Carbonate (CALCIUM 500 PO)   Oral   Take 1 tablet by mouth 2 (two) times daily.         . cetirizine (ZYRTEC) 10 MG tablet  Oral   Take 10 mg by mouth every morning.          . Cholecalciferol (VITAMIN D3) 2000 UNITS TABS   Oral   Take 1 tablet by mouth every morning.          . clopidogrel (PLAVIX) 75 MG tablet   Oral   Take 1 tablet (75 mg total) by mouth daily with breakfast.   90 tablet   1   . cycloSPORINE (RESTASIS) 0.05 % ophthalmic emulsion   Both Eyes   Place 1 drop into both eyes 2 (two) times daily.         Marland Kitchen estradiol (VIVELLE-DOT) 0.075 MG/24HR   Transdermal   Place 1 patch onto the skin 2 (two) times a week.         . famotidine (PEPCID) 40 MG tablet   Oral   Take 40 mg by mouth every evening.         . folic acid (FOLVITE) 1 MG tablet   Oral   Take 1 mg by mouth every morning.         . Loperamide HCl (IMODIUM PO)   Oral   Take 1 tablet by mouth daily.          . magnesium 30 MG tablet   Oral   Take 30 mg by mouth every morning.         . methocarbamol (ROBAXIN) 500 MG tablet   Oral   Take 1 tablet (500 mg total) by  mouth 3 (three) times daily.   90 tablet   5   . olmesartan (BENICAR) 20 MG tablet   Oral   Take 10 mg by mouth every morning.         . potassium chloride (K-DUR,KLOR-CON) 10 MEQ tablet   Oral   Take 10 mEq by mouth daily.          . rosuvastatin (CRESTOR) 10 MG tablet   Oral   Take 10 mg by mouth at bedtime.         . traMADol (ULTRAM) 50 MG tablet   Oral   Take 1 tablet (50 mg total) by mouth every 8 (eight) hours as needed for pain.   65 tablet   11   . traZODone (DESYREL) 100 MG tablet   Oral   Take 100 mg by mouth at bedtime.             BP 125/63  Pulse 64  Temp(Src) 98 F (36.7 C) (Oral)  Resp 16  SpO2 97%  Physical Exam  Constitutional: She is oriented to person, place, and time. She appears well-developed and well-nourished. No distress.  HENT:  Head: Normocephalic and atraumatic.  Right Ear: Hearing normal.  Nose: Nose normal.  Mouth/Throat: Oropharynx is clear and moist and mucous membranes are normal.  Eyes: Conjunctivae and EOM are normal. Pupils are equal, round, and reactive to light.  Neck: Normal range of motion. Neck supple.  Cardiovascular: Normal rate, regular rhythm, S1 normal and S2 normal.  Exam reveals no gallop and no friction rub.   No murmur heard. Pulmonary/Chest: Effort normal and breath sounds normal. No respiratory distress. She exhibits no tenderness.  Abdominal: Soft. Normal appearance and bowel sounds are normal. There is no hepatosplenomegaly. There is no tenderness. There is no rebound, no guarding, no tenderness at McBurney's point and negative Murphy's sign. No hernia.  Musculoskeletal: Normal range of motion.  Neurological: She is alert and oriented to person, place, and time.  She has normal strength. No cranial nerve deficit or sensory deficit. Coordination normal. GCS eye subscore is 4. GCS verbal subscore is 5. GCS motor subscore is 6.  Normal finger to nose, fine motor function normal  Skin: Skin is warm, dry and  intact. No rash noted. No cyanosis.  Psychiatric: She has a normal mood and affect. Her speech is normal and behavior is normal. Thought content normal.    ED Course  Procedures (including critical care time)  Labs Reviewed  COMPREHENSIVE METABOLIC PANEL - Abnormal; Notable for the following:    Glucose, Bld 119 (*)    GFR calc non Af Amer 66 (*)    GFR calc Af Amer 76 (*)    All other components within normal limits  CBC WITH DIFFERENTIAL  URINALYSIS, ROUTINE W REFLEX MICROSCOPIC   Mr Brain Wo Contrast  08/06/2012  *RADIOLOGY REPORT*  Clinical Data: 67 year old female with new onset dizziness. Similar symptoms in March.  Headache.  MRI HEAD WITHOUT CONTRAST  Technique:  Multiplanar, multiecho pulse sequences of the brain and surrounding structures were obtained according to standard protocol without intravenous contrast.  Comparison: Brain MRI 06/28/2012.  Brain MRA 06/29/2012.  Findings: Stable cerebral volume. No restricted diffusion to suggest acute infarction.  No midline shift, mass effect, evidence of mass lesion, ventriculomegaly, extra-axial collection or acute intracranial hemorrhage.  Cervicomedullary junction and pituitary are within normal limits.  Major intracranial vascular flow voids are stable. Stable gray and white matter signal throughout the brain .  Negative visualized cervical spine. Visualized bone marrow signal is within normal limits.  Suspect previous mastoidectomy on the left.  The other visible internal auditory structures are grossly normal.  Other Visualized paranasal sinuses and mastoids are clear.  Visualized orbit soft tissues are within normal limits.  Stable scalp soft tissues.  IMPRESSION: No acute intracranial abnormality.  Stable MRI appearance of the brain since 06/28/2012.   Original Report Authenticated By: Erskine Speed, M.D.    Mr Cervical Spine Wo Contrast  08/06/2012  *RADIOLOGY REPORT*  Clinical Data: 67 year old female with headache, neck pain,  dizziness.  MRI CERVICAL SPINE WITHOUT CONTRAST  Technique:  Multiplanar and multiecho pulse sequences of the cervical spine, to include the craniocervical junction and cervicothoracic junction, were obtained according to standard protocol without intravenous contrast.  Comparison: Report of the cervical MRI 07/07/2000 (no images available).  Findings: Mild susceptibility artifact related to ACDF hardware at C5-C6 and C6-C7. No marrow edema or evidence of acute osseous abnormality.  Cervicomedullary junction is within normal limits.  Visualized paraspinal soft tissues are within normal limits.  No definite spinal cord signal abnormality.  C2-C3:  Mild facet hypertrophy.  Minimal to mild bilateral C3 foraminal stenosis.  C3-C4:  Mild facet hypertrophy on the left.  Mild to moderate ligament flavum hypertrophy.  Mild right eccentric disc bulge. Borderline spinal stenosis.  Mild to moderate left C4 foraminal stenosis.  C4-C5:  Circumferential disc osteophyte complex with broad-based posterior component of disc.  Moderate ligament flavum hypertrophy. Spinal stenosis with a subtle flattening of the spinal cord. Severe left side facet and uncovertebral hypertrophy.  Severe left C5 foraminal stenosis.  C5-C6:  ACDF.  Ligament flavum hypertrophy.  No stenosis.  C6-C7:  ACDF.  No stenosis.  C7-T1:  Minor disc bulge.  Mild facet hypertrophy greater on the right.  No stenosis.  Visualized upper thoracic spine remarkable for mild to moderate right side facet hypertrophy.  IMPRESSION: 1.  C5-C6 and C6-C7 ACDF. 2.  Adjacent segment  disease at C4-C5 with mild spinal stenosis and subtle spinal cord flattening.  Severe left C5 foraminal stenosis. No spinal cord signal abnormality. 3.  Multifactorial borderline spinal stenosis and mild to moderate left foraminal stenosis at C3-C4.   Original Report Authenticated By: Erskine Speed, M.D.      Diagnosis: 1. Peripheral vertigo 2. Spinal Stenosis - Cervical    MDM  Patient comes to  the ER for evaluation of dizziness. Her dizziness is vertiginous. Symptoms seem consistent with peripheral vertigo, as he started when she rolled over in bed and stood up. She has had positional dizziness since. Neurologic examination was unremarkable. She reports a recent CVA that had associated dizziness, but she did have visual disturbance with her CVA. There is no visual disturbance today. MRI was performed to further evaluate and there is no evidence of a new infarct. Patient is improved with IV Valium and meclizine. Will be discharged with prescription for meclizine.  She did report that she has had increased neck pain since having to stop taking her meloxicam after the stroke. MRI of the neck was performed at the patient's request while she was getting a head MRI. There is spinal stenosis present. Will followup with primary doctor.        Gilda Crease, MD 08/06/12 [phone]

## 2012-08-12 ENCOUNTER — Encounter: Payer: Self-pay | Admitting: Internal Medicine

## 2012-08-12 ENCOUNTER — Ambulatory Visit (INDEPENDENT_AMBULATORY_CARE_PROVIDER_SITE_OTHER): Payer: Medicare Other | Admitting: Internal Medicine

## 2012-08-12 DIAGNOSIS — I10 Essential (primary) hypertension: Secondary | ICD-10-CM

## 2012-08-12 DIAGNOSIS — I6350 Cerebral infarction due to unspecified occlusion or stenosis of unspecified cerebral artery: Secondary | ICD-10-CM

## 2012-08-12 DIAGNOSIS — I119 Hypertensive heart disease without heart failure: Secondary | ICD-10-CM

## 2012-08-12 DIAGNOSIS — I639 Cerebral infarction, unspecified: Secondary | ICD-10-CM

## 2012-08-12 DIAGNOSIS — H8190 Unspecified disorder of vestibular function, unspecified ear: Secondary | ICD-10-CM

## 2012-08-12 MED ORDER — ROSUVASTATIN CALCIUM 10 MG PO TABS: 10.0000 mg | ORAL_TABLET | Freq: Every day | ORAL | Status: DC

## 2012-08-12 MED ORDER — CLOPIDOGREL BISULFATE 75 MG PO TABS: 75.0000 mg | ORAL_TABLET | Freq: Every day | ORAL | Status: DC

## 2012-08-12 MED ORDER — MECLIZINE HCL 25 MG PO TABS: 25.0000 mg | ORAL_TABLET | Freq: Three times a day (TID) | ORAL | Status: DC | PRN

## 2012-08-12 MED ORDER — DIAZEPAM 2 MG PO TABS: 2.0000 mg | ORAL_TABLET | Freq: Four times a day (QID) | ORAL | Status: DC | PRN

## 2012-08-12 NOTE — Assessment & Plan Note (Signed)
She sees her neurologist later this week for f/up

## 2012-08-12 NOTE — Progress Notes (Signed)
Subjective:    Patient ID: Susan Gillespie, female    DOB: 20-Mar-1946, 67 y.o.   MRN: [id number]  HPI  She returns for f/up after she developed dizziness/vertigo/ataxia about one week ago. She was seen in the ER and MRI of brain and neck were not remarkable. She was given valium in the ER and that helped. She has since been taking meclizine and that has helped some. She feels the same today, she says that when she gets up to walk she veers to the side (right and left.)  Review of Systems  Constitutional: Negative.  Negative for fever, chills, diaphoresis, activity change, appetite change, fatigue and unexpected weight change.  HENT: Positive for neck pain (mild aching that does not radiate). Negative for facial swelling and neck stiffness.   Eyes: Negative.  Negative for photophobia, pain, discharge, redness, itching and visual disturbance.  Respiratory: Negative.  Negative for apnea, cough, choking, chest tightness, shortness of breath, wheezing and stridor.   Cardiovascular: Negative.  Negative for chest pain, palpitations and leg swelling.  Gastrointestinal: Negative.  Negative for nausea, vomiting, diarrhea, constipation and anal bleeding.  Endocrine: Negative.   Genitourinary: Negative.   Musculoskeletal: Positive for back pain (low back pain, mild aching) and gait problem. Negative for myalgias and joint swelling.  Skin: Negative for color change, pallor, rash and wound.  Allergic/Immunologic: Negative.   Neurological: Positive for dizziness. Negative for tremors, seizures, syncope, facial asymmetry, speech difficulty, weakness, light-headedness, numbness and headaches.  Hematological: Negative.  Negative for adenopathy. Does not bruise/bleed easily.  Psychiatric/Behavioral: Negative.        Objective:   Physical Exam  Vitals reviewed. Constitutional: She is oriented to person, place, and time. She appears well-developed and well-nourished. No distress.  HENT:  Head: Normocephalic  and atraumatic.  Mouth/Throat: Oropharynx is clear and moist. No oropharyngeal exudate.  Eyes: Conjunctivae and EOM are normal. Pupils are equal, round, and reactive to light. Right eye exhibits no discharge. Left eye exhibits no discharge. No scleral icterus.  Neck: Normal range of motion. Neck supple. No JVD present. No tracheal deviation present. No thyromegaly present.  Cardiovascular: Normal rate, regular rhythm, normal heart sounds and intact distal pulses.  Exam reveals no gallop and no friction rub.   No murmur heard. Pulmonary/Chest: Effort normal and breath sounds normal. No stridor. No respiratory distress. She has no wheezes. She has no rales. She exhibits no tenderness.  Abdominal: Soft. Bowel sounds are normal. She exhibits no distension and no mass. There is no tenderness. There is no rebound and no guarding.  Musculoskeletal: Normal range of motion. She exhibits no edema and no tenderness.  Lymphadenopathy:    She has no cervical adenopathy.  Neurological: She is alert and oriented to person, place, and time. She has normal strength. She displays no atrophy, no tremor and normal reflexes. No cranial nerve deficit or sensory deficit. She exhibits normal muscle tone. She displays no seizure activity. Coordination and gait abnormal. She displays no Babinski's sign on the right side. She displays no Babinski's sign on the left side.  Reflex Scores:      Tricep reflexes are 1+ on the right side and 1+ on the left side.      Bicep reflexes are 1+ on the right side and 1+ on the left side.      Brachioradialis reflexes are 1+ on the right side and 1+ on the left side.      Patellar reflexes are 1+ on the right side and  1+ on the left side.      Achilles reflexes are 1+ on the right side and 1+ on the left side. Skin: Skin is warm and dry. No rash noted. She is not diaphoretic. No erythema. No pallor.  Psychiatric: She has a normal mood and affect. Her behavior is normal. Judgment and  thought content normal.     Lab Results  Component Value Date   WBC 6.1 08/06/2012   HGB 12.8 08/06/2012   HCT 36.8 08/06/2012   PLT 228 08/06/2012   GLUCOSE 119* 08/06/2012   CHOL 156 06/29/2012   TRIG 148 06/29/2012   HDL 65 06/29/2012   LDLCALC 61 06/29/2012   ALT 13 08/06/2012   AST 16 08/06/2012   NA 139 08/06/2012   K 4.4 08/06/2012   CL 103 08/06/2012   CREATININE 0.88 08/06/2012   BUN 18 08/06/2012   CO2 28 08/06/2012   TSH 1.09 02/05/2012   INR 0.93 06/29/2012   HGBA1C 6.1* 06/29/2012   Mr Brain Wo Contrast  08/06/2012  *RADIOLOGY REPORT*  Clinical Data: 67 year old female with new onset dizziness. Similar symptoms in March.  Headache.  MRI HEAD WITHOUT CONTRAST  Technique:  Multiplanar, multiecho pulse sequences of the brain and surrounding structures were obtained according to standard protocol without intravenous contrast.  Comparison: Brain MRI 06/28/2012.  Brain MRA 06/29/2012.  Findings: Stable cerebral volume. No restricted diffusion to suggest acute infarction.  No midline shift, mass effect, evidence of mass lesion, ventriculomegaly, extra-axial collection or acute intracranial hemorrhage.  Cervicomedullary junction and pituitary are within normal limits.  Major intracranial vascular flow voids are stable. Stable gray and white matter signal throughout the brain .  Negative visualized cervical spine. Visualized bone marrow signal is within normal limits.  Suspect previous mastoidectomy on the left.  The other visible internal auditory structures are grossly normal.  Other Visualized paranasal sinuses and mastoids are clear.  Visualized orbit soft tissues are within normal limits.  Stable scalp soft tissues.  IMPRESSION: No acute intracranial abnormality.  Stable MRI appearance of the brain since 06/28/2012.   Original Report Authenticated By: Erskine Speed, M.D.    Mr Cervical Spine Wo Contrast  08/06/2012  *RADIOLOGY REPORT*  Clinical Data: 68 year old female with headache, neck pain,  dizziness.  MRI CERVICAL SPINE WITHOUT CONTRAST  Technique:  Multiplanar and multiecho pulse sequences of the cervical spine, to include the craniocervical junction and cervicothoracic junction, were obtained according to standard protocol without intravenous contrast.  Comparison: Report of the cervical MRI 07/07/2000 (no images available).  Findings: Mild susceptibility artifact related to ACDF hardware at C5-C6 and C6-C7. No marrow edema or evidence of acute osseous abnormality.  Cervicomedullary junction is within normal limits.  Visualized paraspinal soft tissues are within normal limits.  No definite spinal cord signal abnormality.  C2-C3:  Mild facet hypertrophy.  Minimal to mild bilateral C3 foraminal stenosis.  C3-C4:  Mild facet hypertrophy on the left.  Mild to moderate ligament flavum hypertrophy.  Mild right eccentric disc bulge. Borderline spinal stenosis.  Mild to moderate left C4 foraminal stenosis.  C4-C5:  Circumferential disc osteophyte complex with broad-based posterior component of disc.  Moderate ligament flavum hypertrophy. Spinal stenosis with a subtle flattening of the spinal cord. Severe left side facet and uncovertebral hypertrophy.  Severe left C5 foraminal stenosis.  C5-C6:  ACDF.  Ligament flavum hypertrophy.  No stenosis.  C6-C7:  ACDF.  No stenosis.  C7-T1:  Minor disc bulge.  Mild facet hypertrophy greater on the  right.  No stenosis.  Visualized upper thoracic spine remarkable for mild to moderate right side facet hypertrophy.  IMPRESSION: 1.  C5-C6 and C6-C7 ACDF. 2.  Adjacent segment disease at C4-C5 with mild spinal stenosis and subtle spinal cord flattening.  Severe left C5 foraminal stenosis. No spinal cord signal abnormality. 3.  Multifactorial borderline spinal stenosis and mild to moderate left foraminal stenosis at C3-C4.   Original Report Authenticated By: Erskine Speed, M.D.      Assessment & Plan:

## 2012-08-12 NOTE — Assessment & Plan Note (Signed)
Her BP is well controlled 

## 2012-08-12 NOTE — Patient Instructions (Addendum)
Vertigo Vertigo means you feel like you or your surroundings are moving when they are not. Vertigo can be dangerous if it occurs when you are at work, driving, or performing difficult activities.  CAUSES  Vertigo occurs when there is a conflict of signals sent to your brain from the visual and sensory systems in your body. There are many different causes of vertigo, including:  Infections, especially in the inner ear.  A bad reaction to a drug or misuse of alcohol and medicines.  Withdrawal from drugs or alcohol.  Rapidly changing positions, such as lying down or rolling over in bed.  A migraine headache.  Decreased blood flow to the brain.  Increased pressure in the brain from a head injury, infection, tumor, or bleeding. SYMPTOMS  You may feel as though the world is spinning around or you are falling to the ground. Because your balance is upset, vertigo can cause nausea and vomiting. You may have involuntary eye movements (nystagmus). DIAGNOSIS  Vertigo is usually diagnosed by physical exam. If the cause of your vertigo is unknown, your caregiver may perform imaging tests, such as an MRI scan (magnetic resonance imaging). TREATMENT  Most cases of vertigo resolve on their own, without treatment. Depending on the cause, your caregiver may prescribe certain medicines. If your vertigo is related to body position issues, your caregiver may recommend movements or procedures to correct the problem. In rare cases, if your vertigo is caused by certain inner ear problems, you may need surgery. HOME CARE INSTRUCTIONS   Follow your caregiver's instructions.  Avoid driving.  Avoid operating heavy machinery.  Avoid performing any tasks that would be dangerous to you or others during a vertigo episode.  Tell your caregiver if you notice that certain medicines seem to be causing your vertigo. Some of the medicines used to treat vertigo episodes can actually make them worse in some people. SEEK  IMMEDIATE MEDICAL CARE IF:   Your medicines do not relieve your vertigo or are making it worse.  You develop problems with talking, walking, weakness, or using your arms, hands, or legs.  You develop severe headaches.  Your nausea or vomiting continues or gets worse.  You develop visual changes.  A family member notices behavioral changes.  Your condition gets worse. MAKE SURE YOU:  Understand these instructions.  Will watch your condition.  Will get help right away if you are not doing well or get worse. Document Released: 01/11/2005 Document Revised: 06/26/2011 Document Reviewed: 10/20/2010 [employer] Patient Information 2013 [employer].  

## 2012-08-12 NOTE — Assessment & Plan Note (Signed)
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

## 2012-08-13 ENCOUNTER — Other Ambulatory Visit: Payer: Self-pay | Admitting: *Deleted

## 2012-08-15 ENCOUNTER — Ambulatory Visit (INDEPENDENT_AMBULATORY_CARE_PROVIDER_SITE_OTHER): Payer: Medicare Other | Admitting: Neurology

## 2012-08-15 ENCOUNTER — Encounter: Payer: Self-pay | Admitting: Neurology

## 2012-08-15 VITALS — BP 102/68 | HR 86 | Ht 64.0 in | Wt 155.0 lb

## 2012-08-15 DIAGNOSIS — I639 Cerebral infarction, unspecified: Secondary | ICD-10-CM

## 2012-08-15 DIAGNOSIS — I6350 Cerebral infarction due to unspecified occlusion or stenosis of unspecified cerebral artery: Secondary | ICD-10-CM

## 2012-08-15 NOTE — Patient Instructions (Addendum)
She was advised to stay on Plavix for secondary stroke prevention and maintain strict control of hypertension but blood pressure goal below 130/90 and lipids with LDL cholesterol goal below 100 mg percent. Check MRA of the neck with and without contrast and referred to vestibular rehabilitation for dizziness exercises. Return for followup in 2 months with Larita Fife, nurse practitioner,

## 2012-08-16 NOTE — Progress Notes (Signed)
[facility] [address] [place]. [place] [address] [phone]       OFFICE CONSULT NOTE  Susan Gillespie Date of Birth:  06/18/1945 Medical Record Number:  [id number]   Referring MD:  Santa Genera, MD  Reason for Referral:  stroke  HPI: Susan Gillespie is a 89 year who has been having intermittent dizziness and vertigo spells for the last 1 month. These are transient and lasted only 10 seconds. She had a prolonged episode on 06/28/12 and she reported vertigo feeling of imbalance followed by some transient vertical diplopia which lasted only 5-6 months. She denied any complaint headache, ringing in the ears. 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 She was started on meclizine for the last 1 week by her primary physician which does not seem to have helped. She was given some Valium in the emergency room which actually seems to be working better. She states that the dizziness or vertigo his unrelated to change in head position. She does have remote history of migraines but states she's had none for the last 20-30 years. 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 She denies any known prior history of strokes, TIAs or seizures however does have a family history of stroke in her father. She had an MRI scan of the brain done on 06/28/12 which showed a very subtle diffusion-positive lesions in the ventral pons on the left which was thought to be a small infarct an MRI of the brain did not show large vessel stenosis. Carotid up ultrasound and transthoracic echo was unremarkable. She does have risk factors for small vessel disease in the form of hypertension hyperlipidemia . She was previously on aspirin which has now been changed to Plavix which she seems to tolerating well with only minor bruising. She denies any prior history of paroxysmal positional vertigo, ear infections or trauma. ROS:    14 system review of systems is positive for hearing loss, easy bruising, joint pain, allergies and vertigo  PMH:  Past Medical History  Diagnosis Date  . Colon polyp   . Diverticulitis   . Headache   . Hyperlipidemia   . Arthritis   . Family history of cardiovascular disease   . HTN (hypertension)   . IBS (irritable bowel syndrome)     followed by Dr. Kinnie Scales  . Hypertension   . IBS (irritable bowel syndrome)   . GERD (gastroesophageal reflux disease)   . Bleeding tendency 05/16/2012    Hx post op bleeding  Normal platelet count  . Diabetes mellitus     Type II: Pt states that she is not diabetic    Social History:  History   Social History  . Marital Status: Single    Spouse Name: N/A    Number of Children: N/A  . Years of Education: N/A   Occupational History  . NURSE     Infusion nurse at [employer]   Social History Main Topics  . Smoking status: Former Games developer  . Smokeless tobacco: Never Used  . Alcohol Use: 2.4 oz/week    4 Glasses of wine per week  . Drug Use: No  . Sexually Active: Not on file   Other Topics Concern  . Not on file   Social History Narrative   ** Merged History Encounter **       Domestic partner   Regular exercise-no    Medications:  Current Outpatient Prescriptions on File Prior to Visit  Medication Sig Dispense Refill  . acetaminophen (TYLENOL) 650 MG CR tablet Take 650 mg by mouth once.      . ALPRAZolam (XANAX) 0.5 MG tablet       . Calcium Carbonate (CALCIUM 500 PO) Take 1 tablet by mouth 2 (two) times daily.      . cetirizine (ZYRTEC) 10 MG tablet Take 10 mg by mouth every morning.       . Cholecalciferol (VITAMIN D3) 2000 UNITS TABS Take 1 tablet by mouth every morning.       . clopidogrel (PLAVIX) 75 MG tablet Take 1 tablet (75 mg total) by mouth daily with breakfast.  90 tablet  1  . cycloSPORINE (RESTASIS) 0.05 % ophthalmic emulsion Place 1 drop into both eyes 2 (two) times daily.      . diazepam (VALIUM) 2 MG tablet Take 1  tablet (2 mg total) by mouth every 6 (six) hours as needed for anxiety.  60 tablet  2  . famotidine (PEPCID) 40 MG tablet Take 40 mg by mouth every evening.      . folic acid (FOLVITE) 1 MG tablet Take 1 mg by mouth every morning.      . Loperamide HCl (IMODIUM PO) Take 1 tablet by mouth daily.       . magnesium 30 MG tablet Take 30 mg by mouth every morning.      . meclizine (ANTIVERT) 25 MG tablet Take 1 tablet (25 mg total) by mouth 3 (three) times daily as needed for dizziness.  270 tablet  1  . methocarbamol (ROBAXIN) 500 MG tablet Take 1 tablet (500 mg total) by mouth 3 (three) times daily.  90 tablet  5  . metoprolol (LOPRESSOR) 50 MG tablet       . olmesartan (BENICAR) 20 MG tablet Take 10 mg by mouth every morning.      . rosuvastatin (CRESTOR) 10 MG tablet Take 1 tablet (10 mg total) by mouth daily.  90 tablet  3  . traMADol (ULTRAM) 50 MG tablet Take 1 tablet (50 mg total) by mouth every 8 (eight) hours as needed for pain.  65 tablet  11  . traZODone (DESYREL) 100 MG tablet Take 100 mg by mouth at bedtime.        Marland Kitchen VIVELLE-DOT 0.075 MG/24HR        No current facility-administered medications on file prior to visit.    Allergies:   Allergies  Allergen Reactions  . Daypro (Oxaprozin) Swelling    facial  . Lisinopril     cough  . Other Itching    narcotics  . Oxaprozin     Unknown.    . Simvastatin     REACTION: myalgias and fatigue   Filed Vitals:   08/15/12 1342  BP: 102/68  Pulse: 86    Physical Exam General: well developed, well nourished, seated, in no evident distress Head: head normocephalic and atraumatic. Orohparynx benign Neck: supple with no carotid or supraclavicular bruits Cardiovascular: regular rate and rhythm, no murmurs Musculoskeletal: no deformity Skin:  no rash/petichiae Vascular:  Normal pulses all extremities  Neurologic Exam Mental Status: Awake and fully alert. Oriented to place and time. Recent and remote memory intact. Attention span,  concentration and fund of knowledge appropriate. Mood and affect appropriate.  Cranial Nerves: Fundoscopic exam reveals sharp disc margins. Pupils equal, briskly reactive to light. Extraocular movements full without nystagmus. Visual fields full to confrontation.  . Facial sensation  intact. Face, tongue, palate moves normally and symmetrically.  hearing is diminished on the right despite hearing aid Motor: Normal bulk and tone. Normal strength in all tested extremity muscles. Sensory.: intact to tough and pinprick and vibratory.  Coordination: Rapid alternating movements normal in all extremities. Finger-to-nose and heel-to-shin performed accurately bilaterally. Headshaking produced no nystagmus and only mild subjective dizziness. Fukuda stepping test was positive for patient moving off the base but negative for any turning. Hallpike maneuver was negative. Gait and Station: Arises from chair without difficulty. Stance is normal. Gait demonstrates normal stride length and balance . Able to heel, toe and tandem walk without difficulty.  Reflexes: 1+ and symmetric. Toes downgoing.     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 Check MRA of the neck with and without contrast and referred to vestibular rehabilitation for dizziness exercises. Return for followup in 2 months with Larita Fife, nurse practitioner

## 2012-08-23 ENCOUNTER — Ambulatory Visit: Payer: Medicare Other | Attending: Neurology | Admitting: Rehabilitative and Restorative Service Providers"

## 2012-08-31 ENCOUNTER — Encounter: Payer: Self-pay | Admitting: Internal Medicine

## 2012-09-10 ENCOUNTER — Ambulatory Visit: Payer: Medicare Other | Admitting: Internal Medicine

## 2012-09-10 DIAGNOSIS — Z0289 Encounter for other administrative examinations: Secondary | ICD-10-CM

## 2012-09-17 ENCOUNTER — Ambulatory Visit
Admission: RE | Admit: 2012-09-17 | Discharge: 2012-09-17 | Disposition: A | Discharge status: Home / Self Care | Payer: Medicare Other | Source: Ambulatory Visit | Attending: Neurology | Admitting: Neurology

## 2012-09-17 DIAGNOSIS — I6529 Occlusion and stenosis of unspecified carotid artery: Secondary | ICD-10-CM

## 2012-09-17 DIAGNOSIS — I639 Cerebral infarction, unspecified: Secondary | ICD-10-CM

## 2012-09-17 MED ORDER — GADOBENATE DIMEGLUMINE 529 MG/ML IV SOLN
14.0000 mL | Freq: Once | INTRAVENOUS | Status: AC | PRN
  Administered 2012-09-17: 14 mL via INTRAVENOUS

## 2012-09-19 ENCOUNTER — Ambulatory Visit (INDEPENDENT_AMBULATORY_CARE_PROVIDER_SITE_OTHER): Payer: Medicare Other | Admitting: Internal Medicine

## 2012-09-19 ENCOUNTER — Encounter: Payer: Self-pay | Admitting: Internal Medicine

## 2012-09-19 VITALS — BP 130/82 | HR 80 | Temp 98.8°F | Resp 16 | Wt 151.0 lb

## 2012-09-19 DIAGNOSIS — M545 Low back pain, unspecified: Secondary | ICD-10-CM

## 2012-09-19 DIAGNOSIS — I10 Essential (primary) hypertension: Secondary | ICD-10-CM

## 2012-09-19 DIAGNOSIS — E785 Hyperlipidemia, unspecified: Secondary | ICD-10-CM

## 2012-09-19 DIAGNOSIS — K219 Gastro-esophageal reflux disease without esophagitis: Secondary | ICD-10-CM

## 2012-09-19 LAB — HM DIABETES FOOT EXAM

## 2012-09-19 MED ORDER — LOSARTAN POTASSIUM 100 MG PO TABS: 100.0000 mg | ORAL_TABLET | Freq: Every day | ORAL | Status: DC

## 2012-09-19 MED ORDER — FAMOTIDINE 40 MG PO TABS: 40.0000 mg | ORAL_TABLET | Freq: Every evening | ORAL | Status: DC | PRN

## 2012-09-19 NOTE — Progress Notes (Signed)
  Subjective:    Patient ID: Susan Gillespie, female    DOB: [birth year], 67 y.o.   MRN: [id number]  Hypertension This is a chronic problem. The current episode started more than 1 year ago. The problem is unchanged. The problem is controlled. Associated symptoms include neck pain. Pertinent negatives include no anxiety, blurred vision, chest pain, headaches, malaise/fatigue, orthopnea, palpitations, peripheral edema, PND, shortness of breath or sweats. Past treatments include beta blockers and angiotensin blockers. The current treatment provides moderate improvement. Compliance problems include medication cost, exercise and diet.  Hypertensive end-organ damage includes CVA.      Review of Systems  Constitutional: Negative.  Negative for malaise/fatigue.  HENT: Positive for neck pain. Negative for facial swelling and neck stiffness.   Eyes: Negative.  Negative for blurred vision.  Respiratory: Negative.  Negative for shortness of breath.   Cardiovascular: Negative.  Negative for chest pain, palpitations, orthopnea, leg swelling and PND.  Gastrointestinal: Negative.  Negative for nausea, abdominal pain, diarrhea, constipation and blood in stool.  Endocrine: Negative.   Genitourinary: Negative.   Musculoskeletal: Positive for back pain.  Skin: Negative.   Allergic/Immunologic: Negative.   Neurological: Negative.  Negative for dizziness, tremors, facial asymmetry, speech difficulty, weakness, light-headedness, numbness and headaches.  Hematological: Negative.  Negative for adenopathy. Does not bruise/bleed easily.  Psychiatric/Behavioral: Negative.        Objective:   Physical Exam  Vitals reviewed. Constitutional: She is oriented to person, place, and time. She appears well-developed and well-nourished. No distress.  HENT:  Head: Normocephalic and atraumatic.  Mouth/Throat: Oropharynx is clear and moist. No oropharyngeal exudate.  Eyes: Conjunctivae are normal. Right eye exhibits no  discharge. Left eye exhibits no discharge. No scleral icterus.  Neck: Normal range of motion. Neck supple. No JVD present. No tracheal deviation present. No thyromegaly present.  Cardiovascular: Normal rate, regular rhythm, normal heart sounds and intact distal pulses.  Exam reveals no gallop and no friction rub.   No murmur heard. Pulmonary/Chest: Effort normal and breath sounds normal. No stridor. No respiratory distress. She has no wheezes. She has no rales. She exhibits no tenderness.  Abdominal: Soft. Bowel sounds are normal. She exhibits no distension and no mass. There is no tenderness. There is no rebound and no guarding.  Musculoskeletal: Normal range of motion. She exhibits no edema and no tenderness.  Lymphadenopathy:    She has no cervical adenopathy.  Neurological: She is oriented to person, place, and time.  Skin: Skin is warm and dry. No rash noted. She is not diaphoretic. No erythema. No pallor.  Psychiatric: She has a normal mood and affect. Her behavior is normal. Judgment and thought content normal.     Lab Results  Component Value Date   WBC 6.1 08/06/2012   HGB 12.8 08/06/2012   HCT 36.8 08/06/2012   PLT 228 08/06/2012   GLUCOSE 119* 08/06/2012   CHOL 156 06/29/2012   TRIG 148 06/29/2012   HDL 65 06/29/2012   LDLCALC 61 06/29/2012   ALT 13 08/06/2012   AST 16 08/06/2012   NA 139 08/06/2012   K 4.4 08/06/2012   CL 103 08/06/2012   CREATININE 0.88 08/06/2012   BUN 18 08/06/2012   CO2 28 08/06/2012   TSH 1.09 02/05/2012   INR 0.93 06/29/2012   HGBA1C 6.1* 06/29/2012       Assessment & Plan:

## 2012-09-19 NOTE — Assessment & Plan Note (Signed)
She is doing well on crestor 

## 2012-09-19 NOTE — Assessment & Plan Note (Signed)
Will change Benicar to Losartan at her request

## 2012-09-19 NOTE — Assessment & Plan Note (Signed)
She is doing well on pepcid

## 2012-09-19 NOTE — Patient Instructions (Signed)

## 2012-09-19 NOTE — Assessment & Plan Note (Signed)
This has improved.

## 2012-10-02 ENCOUNTER — Encounter (HOSPITAL_COMMUNITY): Payer: Self-pay | Admitting: *Deleted

## 2012-10-02 ENCOUNTER — Emergency Department (HOSPITAL_COMMUNITY)
Admission: EM | Admit: 2012-10-02 | Discharge: 2012-10-02 | Disposition: A | Discharge status: Home / Self Care | Payer: Medicare Other | Source: Home / Self Care | Attending: Family Medicine | Admitting: Family Medicine

## 2012-10-02 DIAGNOSIS — L089 Local infection of the skin and subcutaneous tissue, unspecified: Secondary | ICD-10-CM

## 2012-10-02 DIAGNOSIS — L723 Sebaceous cyst: Secondary | ICD-10-CM

## 2012-10-02 MED ORDER — CEPHALEXIN 500 MG PO CAPS: 500.0000 mg | ORAL_CAPSULE | Freq: Two times a day (BID) | ORAL | Status: DC

## 2012-10-02 MED ORDER — MUPIROCIN 2 % EX OINT: TOPICAL_OINTMENT | Freq: Three times a day (TID) | CUTANEOUS | Status: DC

## 2012-10-02 NOTE — ED Notes (Signed)
Vitals entered by Tami Kirby (intern) 

## 2012-10-02 NOTE — ED Notes (Signed)
Pt  Has   A mass on  Scalp  That  Has  A    Small     Yellow  Core    On   Top      Of  Head  It  Is  tennder  And  inflammed

## 2012-10-03 NOTE — ED Provider Notes (Signed)
History     CSN: [id number]  Arrival date & time 10/02/12  1030   First MD Initiated Contact with Patient 10/02/12 1143      Chief Complaint  Patient presents with  . Mass    (Consider location/radiation/quality/duration/timing/severity/associated sxs/prior treatment) HPI Comments: 67 y/o female non diabetic comes c/o of a mass in her scalp for years. States area got recently irritated after a visit to the hair salon for dying her hair. Has become tender to touch and has a yellow core on top for last few days.   Past Medical History  Diagnosis Date  . Colon polyp   . Diverticulitis   . Headache(784.0)   . Hyperlipidemia   . Arthritis   . Family history of cardiovascular disease   . HTN (hypertension)   . IBS (irritable bowel syndrome)     followed by Dr. Kinnie Scales  . Hypertension   . IBS (irritable bowel syndrome)   . GERD (gastroesophageal reflux disease)   . Bleeding tendency 05/16/2012    Hx post op bleeding  Normal platelet count  . Diabetes mellitus     Type II: Pt states that she is not diabetic    Past Surgical History  Procedure Laterality Date  . Spine surgery      cervical fusion  . Colon surgery    . Abdominal hysterectomy    . Tonsillectomy    . Cholecystectomy    . Appendectomy      Family History  Problem Relation Age of Onset  . Hyperlipidemia Other   . Hypertension Other   . Cancer Other     lung, esophagus, stomach  . Stroke Other   . Heart disease Father   . Heart disease Sister   . Heart attack Sister   . Heart disease Brother   . Heart attack Brother     History  Substance Use Topics  . Smoking status: Former Games developer  . Smokeless tobacco: Never Used  . Alcohol Use: 2.4 oz/week    4 Glasses of wine per week    OB History   Grav Para Term Preterm Abortions TAB SAB Ect Mult Living                  Review of Systems  Constitutional: Negative for fever and chills.  Skin: Positive for wound.       Irritated skin mass as per HPI   Neurological: Negative for headaches.  All other systems reviewed and are negative.    Allergies  Daypro; Lisinopril; Other; Oxaprozin; and Simvastatin  Home Medications   Current Outpatient Rx  Name  Route  Sig  Dispense  Refill  . acetaminophen (TYLENOL) 650 MG CR tablet   Oral   Take 650 mg by mouth once.         . Calcium Carbonate (CALCIUM 500 PO)   Oral   Take 1 tablet by mouth 2 (two) times daily.         . cephALEXin (KEFLEX) 500 MG capsule   Oral   Take 1 capsule (500 mg total) by mouth 2 (two) times daily.   14 capsule   0   . cetirizine (ZYRTEC) 10 MG tablet   Oral   Take 10 mg by mouth every morning.          . Cholecalciferol (VITAMIN D3) 2000 UNITS TABS   Oral   Take 1 tablet by mouth every morning.          . clopidogrel (  PLAVIX) 75 MG tablet   Oral   Take 1 tablet (75 mg total) by mouth daily with breakfast.   90 tablet   1   . cycloSPORINE (RESTASIS) 0.05 % ophthalmic emulsion   Both Eyes   Place 1 drop into both eyes 2 (two) times daily.         . diazepam (VALIUM) 2 MG tablet   Oral   Take 1 tablet (2 mg total) by mouth every 6 (six) hours as needed for anxiety.   60 tablet   2   . famotidine (PEPCID) 40 MG tablet   Oral   Take 1 tablet (40 mg total) by mouth at bedtime as needed for heartburn.   90 tablet   3   . KLOR-CON 10 10 MEQ tablet               . Loperamide HCl (IMODIUM PO)   Oral   Take 1 tablet by mouth daily.          Marland Kitchen losartan (COZAAR) 100 MG tablet   Oral   Take 1 tablet (100 mg total) by mouth daily.   90 tablet   3   . magnesium 30 MG tablet   Oral   Take 30 mg by mouth every morning.         . meclizine (ANTIVERT) 25 MG tablet   Oral   Take 1 tablet (25 mg total) by mouth 3 (three) times daily as needed for dizziness.   270 tablet   1   . methocarbamol (ROBAXIN) 500 MG tablet   Oral   Take 1 tablet (500 mg total) by mouth 3 (three) times daily.   90 tablet   5   . metoprolol  (LOPRESSOR) 50 MG tablet               . mupirocin ointment (BACTROBAN) 2 %   Topical   Apply topically 3 (three) times daily.   22 g   0   . rosuvastatin (CRESTOR) 10 MG tablet   Oral   Take 1 tablet (10 mg total) by mouth daily.   90 tablet   3   . traMADol (ULTRAM) 50 MG tablet   Oral   Take 1 tablet (50 mg total) by mouth every 8 (eight) hours as needed for pain.   65 tablet   11   . traZODone (DESYREL) 100 MG tablet   Oral   Take 100 mg by mouth at bedtime.           Marland Kitchen VIVELLE-DOT 0.075 MG/24HR                 BP 115/42  Pulse 71  Temp(Src) 97.9 F (36.6 C) (Oral)  Resp 16  SpO2 98%  Physical Exam  Nursing note and vitals reviewed. Constitutional: She is oriented to person, place, and time. She appears well-developed and well-nourished. No distress.  HENT:  Head: Normocephalic and atraumatic.  See scalp skin exam  Cardiovascular: Normal heart sounds.   Pulmonary/Chest: Breath sounds normal.  Lymphadenopathy:    She has no cervical adenopathy.  Neurological: She is alert and oriented to person, place, and time.  Skin: She is not diaphoretic.  Scalp right parietal area: there is a 2x2 cm cystic lesion tender to palpation. There is a pustule on top. No fluctuations or associated cellulitis. Consistent with an epidermal cyst.    ED Course  INCISION AND DRAINAGE Performed by: Sharin Grave Authorized by: Sharin Grave Consent:  Verbal consent obtained. Risks and benefits: risks, benefits and alternatives were discussed Consent given by: patient Patient understanding: patient states understanding of the procedure being performed Patient consent: the patient's understanding of the procedure matches consent given Type: cyst Body area: head/neck Location details: scalp Anesthesia: local infiltration Local anesthetic: lidocaine 1% with epinephrine Anesthetic total: 2 ml Scalpel size: 11 Incision type: single straight Complexity:  simple Drainage: serous Drainage amount: copious Patient tolerance: Patient tolerated the procedure well with no immediate complications. Comments: Was able to remove cyst capsule. No purulent drainage. A reabsorbable loose suture was placed. Antibiotic ointment and compression dressing applied.   (including critical care time)  Labs Reviewed  CULTURE, ROUTINE-ABSCESS   No results found.   1. Infected sebaceous cyst of skin       MDM  Infected sebaceus cyst s/p I&D today. Prescribed keflex oral, and mupirocin ointment. Supportive care including wound care and red flags that should prompt her return to medical attention were discussed with patient and provided in writing.         Sharin Grave, MD 10/03/12 [phone]

## 2012-10-05 LAB — CULTURE, ROUTINE-ABSCESS

## 2012-10-11 NOTE — ED Notes (Signed)
Abscess culture R parietal area of head: Few Diptheroids (Corynebacterium species). No sensitivity.  Pt.  had I and D and was treated with Keflex and Bactroban oint.  6/22 Message to Dr. Alfonse Ras. 6/26 She said it should be OK. Vassie Moselle 10/11/2012

## 2012-10-21 ENCOUNTER — Ambulatory Visit (INDEPENDENT_AMBULATORY_CARE_PROVIDER_SITE_OTHER): Payer: Medicare Other | Admitting: Nurse Practitioner

## 2012-10-21 ENCOUNTER — Encounter: Payer: Self-pay | Admitting: Nurse Practitioner

## 2012-10-21 VITALS — BP 115/64 | HR 63 | Temp 96.9°F | Ht 62.5 in | Wt 149.0 lb

## 2012-10-21 DIAGNOSIS — I6350 Cerebral infarction due to unspecified occlusion or stenosis of unspecified cerebral artery: Secondary | ICD-10-CM

## 2012-10-21 DIAGNOSIS — I639 Cerebral infarction, unspecified: Secondary | ICD-10-CM

## 2012-10-21 MED ORDER — ASPIRIN EC 81 MG PO TBEC: 81.0000 mg | DELAYED_RELEASE_TABLET | Freq: Every day | ORAL | Status: DC

## 2012-10-21 NOTE — Patient Instructions (Signed)
STROKE/TIA INSTRUCTIONS SMOKING Cigarette smoking nearly doubles your risk of having a stroke & is the single most alterable risk factor  If you smoke or have smoked in the last 12 months, you are advised to quit smoking for your health.  Most of the excess cardiovascular risk related to smoking disappears within a year of stopping.  Ask you doctor about anti-smoking medications  [place] Quit Line: 1-800-QUIT NOW  Free Smoking Cessation Classes [phone]  CHOLESTEROL Know your levels; limit fat & cholesterol in your diet  Lab Results  Component Value Date   CHOL 156 06/29/2012   HDL 65 06/29/2012   LDLCALC 61 06/29/2012   TRIG 148 06/29/2012   CHOLHDL 2.4 06/29/2012      Many patients benefit from treatment even if their cholesterol is at goal.  Goal: Total Cholesterol less than 160  Goal:  LDL less than 100  Goal:  HDL greater than 40  Goal:  Triglycerides less than 150  BLOOD PRESSURE American Stroke Association blood pressure target is less that 120/80 mm/Hg  Your discharge blood pressure is:  BP: 115/64 mmHg  Monitor your blood pressure  Limit your salt and alcohol intake  Many individuals will require more than one medication for high blood pressure  DIABETES (A1c is a blood sugar average for last 3 months) Goal A1c is under 7% (A1c is blood sugar average for last 3 months)  Diabetes: No known diagnosis of diabetes    Lab Results  Component Value Date   HGBA1C 6.1* 06/29/2012    Your A1c can be lowered with medications, healthy diet, and exercise.  Check your blood sugar as directed by your physician  Call your physician if you experience unexplained or low blood sugars.  PHYSICAL ACTIVITY/REHABILITATION Goal is 30 minutes at least 4 days per week    Activity decreases your risk of heart attack and stroke and makes your heart stronger.  It helps control your weight and blood pressure; helps you relax and can improve your mood.  Participate in a regular exercise  program.  Talk with your doctor about the best form of exercise for you (dancing, walking, swimming, cycling).  DIET/WEIGHT Goal is to maintain a healthy weight  Your height is:  Height: 5' 2.5" (158.8 cm) Your current weight is: Weight: 149 lb (67.586 kg) Your body Mass Index (BMI) is:  BMI (Calculated): 26.9  Following the type of diet specifically designed for you will help prevent another stroke.  Your goal Body Mass Index (BMI) is 19-24.  Healthy food habits can help reduce 3 risk factors for stroke:  High cholesterol, hypertension, and excess weight.

## 2012-10-21 NOTE — Progress Notes (Signed)
[facility]  PATIENT: Susan Gillespie DOB: Feb 02, 1946   HISTORY FROM: patient, chart REASON FOR VISIT: 2 month followup, last visit 08/15/12  HISTORY OF PRESENT ILLNESS:  Susan Gillespie is a 55 year who has been having intermittent dizziness and vertigo spells for the last 1 month. These are transient and lasted only 10 seconds. She had a prolonged episode on 06/28/12 and she reported vertigo feeling of imbalance followed by some transient vertical diplopia which lasted only 5-6 months. She denied any complaint headache, ringing in the ears. 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 She was started on meclizine for the last 1 week by her primary physician which does not seem to have helped. She was given some Valium in the emergency room which actually seems to be working better. She states that the dizziness or vertigo his unrelated to change in head position. She does have remote history of migraines but states she's had none for the last 20-30 years. 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 She denies any known prior history of strokes, TIAs or seizures however does have a family history of stroke in her father. She had an MRI scan of the brain done on 06/28/12 which showed a very subtle diffusion-positive lesions in the ventral pons on the left which was thought to be a small infarct an MRI of the brain did not show large vessel stenosis. Carotid up ultrasound and transthoracic echo was unremarkable. She does have risk factors for small vessel disease in the form of hypertension hyperlipidemia . She was previously on aspirin which has now been changed to Plavix which she seems to tolerating well with only minor bruising. She denies any prior history of paroxysmal positional vertigo, ear infections or trauma.  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 Transient episodes of vertigo, dizziness and diplopia occurring after infarct have resolved.  She denies any vision problems, dizziness, balance problems, or weakness.  Physical therapy was not needed.  Patient has multiple bruises on both legs in various stages of healing, with no signs of bleeding.  She is requesting to come off of Plavix due to the bruising. Complains of mild numbness on lateral side of right foot from lower back disc, otherwise doing well.  She reports her blood pressure was running too low (80's) and has since started taking half the dose with good results, systolic now in the 110's.  She has sold her 4 bedroom house and is moving nearby to a 2 bedroom apartment this weekend.  REVIEW OF SYSTEMS: Full 14 system review of systems performed and notable only for: constitutional: N/A  cardiovascular: N/A respiratory: N/A Hematology/Lymph: easy bleeding, easy bruising endocrine: N/A  ear/nose/throat: hearing loss  musculoskeletal: joiunt pain, achiing muscles skin: N/A genitourinary: N/A Gastrointestinal: N/A allergy/immunology: allergies neurological: numbness, lateral right foot sleep: N/A psychiatric: anxiety   ALLERGIES: Allergies  Allergen Reactions  . Daypro (Oxaprozin) Swelling    facial  . Lisinopril     cough  . Other Itching    narcotics  . Oxaprozin     Unknown.    . Simvastatin     REACTION: myalgias and fatigue    HOME MEDICATIONS: Outpatient Prescriptions Prior to Visit  Medication Sig Dispense Refill  . acetaminophen (TYLENOL) 650 MG CR tablet  Take 650 mg by mouth once.      . Calcium Carbonate (CALCIUM 500 PO) Take 1 tablet by mouth 2 (two) times daily.      . cetirizine (ZYRTEC) 10 MG tablet Take 10 mg by mouth every morning.       . Cholecalciferol (VITAMIN D3) 2000 UNITS TABS Take 1 tablet by mouth every morning.       . cycloSPORINE (RESTASIS)  0.05 % ophthalmic emulsion Place 1 drop into both eyes 2 (two) times daily.      . famotidine (PEPCID) 40 MG tablet Take 1 tablet (40 mg total) by mouth at bedtime as needed for heartburn.  90 tablet  3  . KLOR-CON 10 10 MEQ tablet       . Loperamide HCl (IMODIUM PO) Take 1 tablet by mouth daily.       Marland Kitchen losartan (COZAAR) 100 MG tablet Take 1 tablet (100 mg total) by mouth daily.  90 tablet  3  . methocarbamol (ROBAXIN) 500 MG tablet Take 1 tablet (500 mg total) by mouth 3 (three) times daily.  90 tablet  5  . rosuvastatin (CRESTOR) 10 MG tablet Take 1 tablet (10 mg total) by mouth daily.  90 tablet  3  . traMADol (ULTRAM) 50 MG tablet Take 1 tablet (50 mg total) by mouth every 8 (eight) hours as needed for pain.  65 tablet  11  . traZODone (DESYREL) 100 MG tablet Take 100 mg by mouth at bedtime.        Marland Kitchen VIVELLE-DOT 0.075 MG/24HR       . clopidogrel (PLAVIX) 75 MG tablet Take 1 tablet (75 mg total) by mouth daily with breakfast.  90 tablet  1  . diazepam (VALIUM) 2 MG tablet Take 1 tablet (2 mg total) by mouth every 6 (six) hours as needed for anxiety.  60 tablet  2  . magnesium 30 MG tablet Take 30 mg by mouth every morning.      . meclizine (ANTIVERT) 25 MG tablet Take 1 tablet (25 mg total) by mouth 3 (three) times daily as needed for dizziness.  270 tablet  1  . metoprolol (LOPRESSOR) 50 MG tablet       . cephALEXin (KEFLEX) 500 MG capsule Take 1 capsule (500 mg total) by mouth 2 (two) times daily.  14 capsule  0  . mupirocin ointment (BACTROBAN) 2 % Apply topically 3 (three) times daily.  22 g  0   No facility-administered medications prior to visit.    PAST MEDICAL HISTORY: Past Medical History  Diagnosis Date  . Colon polyp   . Diverticulitis   . Headache(784.0)   . Hyperlipidemia   . Arthritis   . Family history of cardiovascular disease   . HTN (hypertension)   . IBS (irritable bowel syndrome)     followed by Dr. Kinnie Scales  . Hypertension   . IBS (irritable bowel syndrome)     . GERD (gastroesophageal reflux disease)   . Bleeding tendency 05/16/2012    Hx post op bleeding  Normal platelet count  . Diabetes mellitus     Type II: Pt states that she is not diabetic  . Stroke     PAST SURGICAL HISTORY: Past Surgical History  Procedure Laterality Date  . Spine surgery      cervical fusion  . Colon surgery    . Abdominal hysterectomy    . Tonsillectomy    . Cholecystectomy    . Appendectomy  FAMILY HISTORY: Family History  Problem Relation Age of Onset  . Hyperlipidemia Other   . Hypertension Other   . Cancer Other     lung, esophagus, stomach  . Stroke Other   . Heart disease Father   . Heart disease Sister   . Heart attack Sister   . Heart disease Brother   . Heart attack Brother     SOCIAL HISTORY: History   Social History  . Marital Status: Single    Spouse Name: N/A    Number of Children: N/A  . Years of Education: N/A   Occupational History  . NURSE     Infusion nurse at [employer]   Social History Main Topics  . Smoking status: Former Games developer  . Smokeless tobacco: Never Used  . Alcohol Use: 2.4 oz/week    4 Glasses of wine per week  . Drug Use: No  . Sexually Active: Not on file   Other Topics Concern  . Not on file   Social History Narrative   ** Merged History Encounter **       Domestic partner   Regular exercise-no     PHYSICAL EXAM  Filed Vitals:   10/21/12 1351  BP: 115/64  Pulse: 63  Temp: 96.9 F (36.1 C)  TempSrc: Oral  Height: 5' 2.5" (1.588 m)  Weight: 149 lb (67.586 kg)   Body mass index is 26.8 kg/(m^2).  Generalized: In no acute distress   Neck: Supple, no carotid bruits   Cardiac: Regular rate rhythm, no murmur   Pulmonary: Clear to auscultation bilaterally   Musculoskeletal: No deformity  Skin: multiple small areas brusing on legs in different stages of healing.  Neurological examination    Mentation: Alert oriented to time, place, history taking, language fluent, and causual  conversation  Cranial nerve II-XII: Pupils were equal round reactive to light extraocular movements were full, visual field were full on confrontational test. facial sensation and strength were normal. hearing was intact to finger rubbing bilaterally. Uvula tongue midline. head turning and shoulder shrug and were normal and symmetric.Tongue protrusion into cheek strength was normal. MOTOR: normal bulk and tone, full strength in the BUE, BLE, fine finger movements normal, no pronator drift SENSORY: normal and symmetric to light touch, pinprick, temperature, vibration and proprioception COORDINATION: finger-nose-finger, heel-to-shin bilaterally, there was no truncal ataxia REFLEXES: Brachioradialis 2/2, biceps 2/2, triceps 2/2, patellar 2/2, Achilles 2/2, plantar responses were flexor bilaterally. GAIT/STATION: Rising up from seated position without assistance, normal stance, without trunk ataxia, moderate stride, good arm swing, smooth turning, able to perform tiptoe, and heel walking without difficulty.    DIAGNOSTIC DATA (LABS, IMAGING, TESTING) - I reviewed patient records, labs, notes, testing and imaging myself where available.  Lab Results  Component Value Date   WBC 6.1 08/06/2012   HGB 12.8 08/06/2012   HCT 36.8 08/06/2012   MCV 92.0 08/06/2012   PLT 228 08/06/2012      Component Value Date/Time   NA 139 08/06/2012 1757   K 4.4 08/06/2012 1757   CL 103 08/06/2012 1757   CO2 28 08/06/2012 1757   GLUCOSE 119* 08/06/2012 1757   BUN 18 08/06/2012 1757   CREATININE 0.88 08/06/2012 1757   CALCIUM 10.2 08/06/2012 1757   PROT 6.7 08/06/2012 1757   ALBUMIN 3.8 08/06/2012 1757   AST 16 08/06/2012 1757   ALT 13 08/06/2012 1757   ALKPHOS 58 08/06/2012 1757   BILITOT 0.4 08/06/2012 1757   GFRNONAA 66* 08/06/2012 1757   GFRAA  76* 08/06/2012 1757   Lab Results  Component Value Date   CHOL 156 06/29/2012   HDL 65 06/29/2012   LDLCALC 61 06/29/2012   TRIG 148 06/29/2012   CHOLHDL 2.4 06/29/2012   Lab  Results  Component Value Date   HGBA1C 6.1* 06/29/2012   No results found for this basename: [id number]   Lab Results  Component Value Date   TSH 1.09 02/05/2012    MR Angiogram Neck W Wo Contrast 08/15/12: Abnormal MRA of the neck showing mild plaque involving proximal left internal carotid artery with less than 30% stenosis. Both vertebral arteries have antegrade flow  MRI CERVICAL SPINE WITHOUT CONTRAST 08/06/12: 1. C5-C6 and C6-C7 ACDF.  2. Adjacent segment disease at C4-C5 with mild spinal stenosis and subtle spinal cord flattening. Severe left C5 foraminal stenosis.  No spinal cord signal abnormality.  3. Multifactorial borderline spinal stenosis and mild to moderate left foraminal stenosis at C3-C4.   MRI HEAD WITHOUT CONTRAST 08/06/12 No acute intracranial abnormality. Stable MRI appearance of the  MRI LUMBAR SPINE WITHOUT CONTRAST 07/09/12: 1. Mild lateral recess narrowing bilaterally at L4-5 secondary to a broad-based disc herniation and facet hypertrophy.  2. Slight encroachment of the foramina bilaterally at L4-5 without significant stenosis.  3. Multilevel disc bulging and mild facet hypertrophy at other levels without significant stenosis.   MRI HEAD WITHOUT AND WITH CONTRAST 06/28/12 Mild small vessel disease affecting the cerebral hemispheric white matter in the pons. Suspicion of a tiny microvascular infarction in the right dorsal pons that could possibly relate to dizziness or diplopia.   MRA HEAD WITHOUT CONTRAST 06/29/12 Normal intracranial MR angiography of the large and medium-sized vessels.   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 Transient episodes of vertigo, dizziness and diplopia occurring after infarct have resolved.    PLAN:  She was advised to discontinue Plavix (due to bruising; patient request) and start  Aspirin 81 mg daily for secondary stroke prevention.  Advised patient that bruising may still occur with aspirin, but it is in her best interest to use anticoagulation to prevent recurrent stroke.     Maintain strict control of hypertension but blood pressure goal below 130/90 and lipids with LDL cholesterol goal below 100 mg percent.  Return for followup in 6 months with Larita Fife, nurse practitioner     Jocabed Cheese NP-C 10/21/2012, 5:36 PM  [facility] [address], [address] [place], [place] [address] [phone]

## 2012-11-20 ENCOUNTER — Other Ambulatory Visit: Payer: Self-pay

## 2012-12-20 ENCOUNTER — Ambulatory Visit (INDEPENDENT_AMBULATORY_CARE_PROVIDER_SITE_OTHER): Payer: Medicare Other | Admitting: Internal Medicine

## 2012-12-20 ENCOUNTER — Encounter: Payer: Self-pay | Admitting: Internal Medicine

## 2012-12-20 VITALS — BP 118/72 | HR 72 | Temp 98.4°F | Resp 16 | Wt 145.0 lb

## 2012-12-20 DIAGNOSIS — M545 Low back pain, unspecified: Secondary | ICD-10-CM

## 2012-12-20 DIAGNOSIS — I10 Essential (primary) hypertension: Secondary | ICD-10-CM

## 2012-12-20 DIAGNOSIS — I6350 Cerebral infarction due to unspecified occlusion or stenosis of unspecified cerebral artery: Secondary | ICD-10-CM

## 2012-12-20 DIAGNOSIS — F418 Other specified anxiety disorders: Secondary | ICD-10-CM | POA: Insufficient documentation

## 2012-12-20 DIAGNOSIS — F411 Generalized anxiety disorder: Secondary | ICD-10-CM

## 2012-12-20 DIAGNOSIS — I639 Cerebral infarction, unspecified: Secondary | ICD-10-CM

## 2012-12-20 MED ORDER — ALPRAZOLAM 0.5 MG PO TABS: 0.5000 mg | ORAL_TABLET | Freq: Three times a day (TID) | ORAL | Status: DC | PRN

## 2012-12-20 NOTE — Patient Instructions (Signed)
Back Pain, Adult  Low back pain is very common. About 1 in 5 people have back pain. The cause of low back pain is rarely dangerous. The pain often gets better over time. About half of people with a sudden onset of back pain feel better in just 2 weeks. About 8 in 10 people feel better by 6 weeks.   CAUSES  Some common causes of back pain include:  · Strain of the muscles or ligaments supporting the spine.  · Wear and tear (degeneration) of the spinal discs.  · Arthritis.  · Direct injury to the back.  DIAGNOSIS  Most of the time, the direct cause of low back pain is not known. However, back pain can be treated effectively even when the exact cause of the pain is unknown. Answering your caregiver's questions about your overall health and symptoms is one of the most accurate ways to make sure the cause of your pain is not dangerous. If your caregiver needs more information, he or she may order lab work or imaging tests (X-rays or MRIs). However, even if imaging tests show changes in your back, this usually does not require surgery.  HOME CARE INSTRUCTIONS  For many people, back pain returns. Since low back pain is rarely dangerous, it is often a condition that people can learn to manage on their own.   · Remain active. It is stressful on the back to sit or stand in one place. Do not sit, drive, or stand in one place for more than 30 minutes at a time. Take short walks on level surfaces as soon as pain allows. Try to increase the length of time you walk each day.  · Do not stay in bed. Resting more than 1 or 2 days can delay your recovery.  · Do not avoid exercise or work. Your body is made to move. It is not dangerous to be active, even though your back may hurt. Your back will likely heal faster if you return to being active before your pain is gone.  · Pay attention to your body when you  bend and lift. Many people have less discomfort when lifting if they bend their knees, keep the load close to their bodies, and  avoid twisting. Often, the most comfortable positions are those that put less stress on your recovering back.  · Find a comfortable position to sleep. Use a firm mattress and lie on your side with your knees slightly bent. If you lie on your back, put a pillow under your knees.  · Only take over-the-counter or prescription medicines as directed by your caregiver. Over-the-counter medicines to reduce pain and inflammation are often the most helpful. Your caregiver may prescribe muscle relaxant drugs. These medicines help dull your pain so you can more quickly return to your normal activities and healthy exercise.  · Put ice on the injured area.  · Put ice in a plastic bag.  · Place a towel between your skin and the bag.  · Leave the ice on for 15-20 minutes, 3-4 times a day for the first 2 to 3 days. After that, ice and heat may be alternated to reduce pain and spasms.  · Ask your caregiver about trying back exercises and gentle massage. This may be of some benefit.  · Avoid feeling anxious or stressed. Stress increases muscle tension and can worsen back pain. It is important to recognize when you are anxious or stressed and learn ways to manage it. Exercise is a great option.  SEEK MEDICAL CARE IF:  · You have pain that is not relieved with rest or   medicine.  · You have pain that does not improve in 1 week.  · You have new symptoms.  · You are generally not feeling well.  SEEK IMMEDIATE MEDICAL CARE IF:   · You have pain that radiates from your back into your legs.  · You develop new bowel or bladder control problems.  · You have unusual weakness or numbness in your arms or legs.  · You develop nausea or vomiting.  · You develop abdominal pain.  · You feel faint.  Document Released: 04/03/2005 Document Revised: 10/03/2011 Document Reviewed: 08/22/2010  Exit[employer]® Patient Information ©2014 [employer].

## 2012-12-20 NOTE — Progress Notes (Signed)
Subjective:    Patient ID: Susan Gillespie, female    DOB: 01/26/1946, 67 y.o.   MRN: [id number]  Back Pain This is a chronic problem. The current episode started more than 1 year ago. The problem occurs intermittently. The problem is unchanged. The pain is present in the lumbar spine. The quality of the pain is described as aching. The pain radiates to the right thigh. The pain is at a severity of 3/10. The pain is mild. The pain is worse during the day. The symptoms are aggravated by bending, position and standing. Pertinent negatives include no abdominal pain, bladder incontinence, bowel incontinence, chest pain, dysuria, fever, headaches, leg pain, numbness, paresis, paresthesias, pelvic pain, perianal numbness, tingling, weakness or weight loss. She has tried analgesics and muscle relaxant for the symptoms. The treatment provided mild relief.      Review of Systems  Constitutional: Negative.  Negative for fever, chills, weight loss, diaphoresis, appetite change and fatigue.  HENT: Negative.   Eyes: Negative.   Respiratory: Negative.  Negative for cough, chest tightness, shortness of breath, wheezing and stridor.   Cardiovascular: Negative.  Negative for chest pain, palpitations and leg swelling.  Gastrointestinal: Negative.  Negative for nausea, vomiting, abdominal pain, diarrhea, constipation and bowel incontinence.  Endocrine: Negative.   Genitourinary: Negative.  Negative for bladder incontinence, dysuria and pelvic pain.  Musculoskeletal: Positive for back pain. Negative for myalgias, joint swelling, arthralgias and gait problem.  Skin: Negative.   Neurological: Negative.  Negative for dizziness, tingling, tremors, seizures, syncope, facial asymmetry, speech difficulty, weakness, light-headedness, numbness, headaches and paresthesias.  Hematological: Negative.  Negative for adenopathy. Does not bruise/bleed easily.  Psychiatric/Behavioral: Positive for sleep disturbance. Negative for  suicidal ideas, hallucinations, behavioral problems, self-injury, dysphoric mood, decreased concentration and agitation. The patient is nervous/anxious. The patient is not hyperactive.        Objective:   Physical Exam  Vitals reviewed. Constitutional: She is oriented to person, place, and time. She appears well-developed and well-nourished. No distress.  HENT:  Head: Normocephalic and atraumatic.  Mouth/Throat: Oropharynx is clear and moist. No oropharyngeal exudate.  Eyes: Conjunctivae are normal. Right eye exhibits no discharge. Left eye exhibits no discharge. No scleral icterus.  Neck: Normal range of motion. Neck supple. No JVD present. No tracheal deviation present. No thyromegaly present.  Cardiovascular: Normal rate, regular rhythm, normal heart sounds and intact distal pulses.  Exam reveals no gallop and no friction rub.   No murmur heard. Pulmonary/Chest: Effort normal and breath sounds normal. No stridor. No respiratory distress. She has no wheezes. She has no rales. She exhibits no tenderness.  Abdominal: Soft. Bowel sounds are normal. She exhibits no distension and no mass. There is no tenderness. There is no rebound and no guarding.  Musculoskeletal: Normal range of motion. She exhibits no edema and no tenderness.  Lymphadenopathy:    She has no cervical adenopathy.  Neurological: She is alert and oriented to person, place, and time. She has normal strength. She displays no atrophy, no tremor and normal reflexes. No cranial nerve deficit or sensory deficit. She exhibits normal muscle tone. She displays a negative Romberg sign. She displays no seizure activity. Coordination and gait normal. She displays no Babinski's sign on the right side. She displays no Babinski's sign on the left side.  Reflex Scores:      Tricep reflexes are 1+ on the right side and 1+ on the left side.      Bicep reflexes are 1+ on the right  side and 1+ on the left side.      Brachioradialis reflexes are 1+  on the right side and 1+ on the left side.      Patellar reflexes are 1+ on the right side and 1+ on the left side.      Achilles reflexes are 1+ on the right side and 1+ on the left side. Neg SLR in BLE  Skin: Skin is warm and dry. No rash noted. She is not diaphoretic. No erythema. No pallor.  Psychiatric: She has a normal mood and affect. Her behavior is normal. Judgment and thought content normal.     Lab Results  Component Value Date   WBC 6.1 08/06/2012   HGB 12.8 08/06/2012   HCT 36.8 08/06/2012   PLT 228 08/06/2012   GLUCOSE 119* 08/06/2012   CHOL 156 06/29/2012   TRIG 148 06/29/2012   HDL 65 06/29/2012   LDLCALC 61 06/29/2012   ALT 13 08/06/2012   AST 16 08/06/2012   NA 139 08/06/2012   K 4.4 08/06/2012   CL 103 08/06/2012   CREATININE 0.88 08/06/2012   BUN 18 08/06/2012   CO2 28 08/06/2012   TSH 1.09 02/05/2012   INR 0.93 06/29/2012   HGBA1C 6.1* 06/29/2012       Assessment & Plan:

## 2012-12-22 ENCOUNTER — Encounter: Payer: Self-pay | Admitting: Internal Medicine

## 2012-12-22 NOTE — Assessment & Plan Note (Signed)
Her BP is well controlled 

## 2012-12-22 NOTE — Assessment & Plan Note (Signed)
There is no residual from this

## 2012-12-22 NOTE — Assessment & Plan Note (Signed)
She will continue the current meds for pain She wants to see NS so I have referred her

## 2012-12-22 NOTE — Assessment & Plan Note (Signed)
She will continue xanax as needed 

## 2013-02-11 ENCOUNTER — Ambulatory Visit (INDEPENDENT_AMBULATORY_CARE_PROVIDER_SITE_OTHER): Payer: Medicare Other | Admitting: Cardiology

## 2013-02-11 ENCOUNTER — Encounter: Payer: Self-pay | Admitting: Cardiology

## 2013-02-11 VITALS — BP 144/78 | HR 64 | Ht 62.5 in | Wt 149.1 lb

## 2013-02-11 DIAGNOSIS — I471 Supraventricular tachycardia, unspecified: Secondary | ICD-10-CM

## 2013-02-11 DIAGNOSIS — I119 Hypertensive heart disease without heart failure: Secondary | ICD-10-CM

## 2013-02-11 DIAGNOSIS — I6350 Cerebral infarction due to unspecified occlusion or stenosis of unspecified cerebral artery: Secondary | ICD-10-CM

## 2013-02-11 DIAGNOSIS — I639 Cerebral infarction, unspecified: Secondary | ICD-10-CM

## 2013-02-11 NOTE — Assessment & Plan Note (Signed)
Her blood pressure has been running high.  It is high today.  I got 146/76.  She has been taking just half of a 100 mg losartan and we will increase her to a full 100 mg daily

## 2013-02-11 NOTE — Assessment & Plan Note (Signed)
The patient's episodes of SVT occur without specific previous activity or diet etc. fortunately they are very responsive to taking when necessary metoprolol.  We will continue current therapy

## 2013-02-11 NOTE — Progress Notes (Signed)
Susan Gillespie Date of Birth:  May 08, 1945 [address] [place], [place]  [address] [phone]         Fax   [phone]  History of Present Illness: This pleasant 67 year old woman is seen for a followup office visit.  A lot has happened in her life since we last saw her one year ago.  In March 2014 she suffered a small stroke.  Dr. Pearlean Brownie is her neurologist.  For a while she was on Plavix and aspirin and presently is on baby aspirin alone.  The patient has a past history of paroxysmal supraventricular tachycardia.  She has had 2 episodes of SVT in October 2014.  She takes metoprolol and the SVT usually breaks within 15 minutes.  The patient had an echocardiogram on 06/29/12 at the time of her stroke and the echo showed normal LV function with ejection fraction of 65-75% and there was grade 1 diastolic dysfunction. The patient does not have any history of known ischemic heart disease. She had a remote Cardiolite stress test about 10 years ago which was normal. The patient has a history of high blood pressure. She has a strong family history of ischemic heart disease. Since we last saw the patient and her mother died in September 15, 2012.  The patient sold her house this summer and is considering moving to a small house at [facility].  She still has close friends here in [place] and anticipates coming back to [place] on a regular basis for visits.   Current Outpatient Prescriptions  Medication Sig Dispense Refill  . acetaminophen (TYLENOL) 650 MG CR tablet Take 650 mg by mouth once.      . ALPRAZolam (XANAX) 0.5 MG tablet Take 1 tablet (0.5 mg total) by mouth 3 (three) times daily as needed for sleep.  65 tablet  5  . aspirin EC 81 MG tablet Take 1 tablet (81 mg total) by mouth daily.      . Calcium Carbonate (CALCIUM 500 PO) Take 1 tablet by mouth 2 (two) times daily.      . cetirizine (ZYRTEC) 10 MG tablet Take 10 mg by mouth every morning.       . Cholecalciferol (VITAMIN D3)  2000 UNITS TABS Take 1 tablet by mouth every morning.       . cycloSPORINE (RESTASIS) 0.05 % ophthalmic emulsion Place 1 drop into both eyes 2 (two) times daily.      . diazepam (VALIUM) 2 MG tablet       . famotidine (PEPCID) 40 MG tablet Take 1 tablet (40 mg total) by mouth at bedtime as needed for heartburn.  90 tablet  3  . KLOR-CON 10 10 MEQ tablet       . Loperamide HCl (IMODIUM PO) Take 1 tablet by mouth daily.       Marland Kitchen losartan (COZAAR) 100 MG tablet Take 1 tablet (100 mg total) by mouth daily.  90 tablet  3  . magnesium 30 MG tablet Take 30 mg by mouth every morning.      . meclizine (ANTIVERT) 25 MG tablet Take 1 tablet (25 mg total) by mouth 3 (three) times daily as needed for dizziness.  270 tablet  1  . methocarbamol (ROBAXIN) 500 MG tablet Take 1 tablet (500 mg total) by mouth 3 (three) times daily.  90 tablet  5  . metoprolol (LOPRESSOR) 50 MG tablet       . rosuvastatin (CRESTOR) 10 MG tablet Take 1 tablet (10 mg  total) by mouth daily.  90 tablet  3  . traMADol (ULTRAM) 50 MG tablet Take 1 tablet (50 mg total) by mouth every 8 (eight) hours as needed for pain.  65 tablet  11  . traZODone (DESYREL) 100 MG tablet Take 100 mg by mouth at bedtime.        Marland Kitchen VIVELLE-DOT 0.075 MG/24HR        No current facility-administered medications for this visit.    Allergies  Allergen Reactions  . Daypro [Oxaprozin] Swelling    facial  . Lisinopril     cough  . Other Itching    narcotics  . Oxaprozin     Unknown.    . Simvastatin     REACTION: myalgias and fatigue    Patient Active Problem List   Diagnosis Date Noted  . GAD (generalized anxiety disorder) 12/20/2012  . Spinal stenosis of lumbar region 07/05/2012  . CVA (cerebral infarction) 06/28/2012  . Bleeding tendency 05/16/2012  . Paroxysmal supraventricular tachycardia 09/25/2011  . Benign hypertensive heart disease without heart failure 12/02/2010  . Essential hypertension, benign 08/22/2010  . Type II or unspecified type  diabetes mellitus without mention of complication, uncontrolled 04/28/2009  . Hyperlipidemia LDL goal < 100 12/16/2008  . GERD 12/16/2008  . OSTEOARTHRITIS 12/16/2008    History  Smoking status  . Former Smoker  Smokeless tobacco  . Never Used    History  Alcohol Use  . 2.4 oz/week  . 4 Glasses of wine per week    Family History  Problem Relation Age of Onset  . Hyperlipidemia Other   . Hypertension Other   . Cancer Other     lung, esophagus, stomach  . Stroke Other   . Heart disease Father   . Heart disease Sister   . Heart attack Sister   . Heart disease Brother   . Heart attack Brother     Review of Systems: Constitutional: no fever chills diaphoresis or fatigue or change in weight.  Head and neck: no hearing loss, no epistaxis, no photophobia or visual disturbance. Respiratory: No cough, shortness of breath or wheezing. Cardiovascular: No chest pain peripheral edema, palpitations. Gastrointestinal: No abdominal distention, no abdominal pain, no change in bowel habits hematochezia or melena. Genitourinary: No dysuria, no frequency, no urgency, no nocturia. Musculoskeletal:No arthralgias, no back pain, no gait disturbance or myalgias. Neurological: No dizziness, no headaches, no numbness, no seizures, no syncope, no weakness, no tremors. Hematologic: No lymphadenopathy, no easy bruising. Psychiatric: No confusion, no hallucinations, no sleep disturbance.    Physical Exam: Filed Vitals:   02/11/13 1005  BP: 144/78  Pulse: 64   the general appearance reveals a well-developed well-nourished late woman in no distress.The head and neck exam reveals pupils equal and reactive.  Extraocular movements are full.  There is no scleral icterus.  The mouth and pharynx are normal.  The neck is supple.  The carotids reveal no bruits.  The jugular venous pressure is normal.  The  thyroid is not enlarged.  There is no lymphadenopathy.  The chest is clear to percussion and  auscultation.  There are no rales or rhonchi.  Expansion of the chest is symmetrical.  The precordium is quiet.  The first heart sound is normal.  The second heart sound is physiologically split.  There is no murmur gallop rub or click.  There is no abnormal lift or heave.  The abdomen is soft and nontender.  The bowel sounds are normal.  The liver and  spleen are not enlarged.  There are no abdominal masses.  There are no abdominal bruits.  Extremities reveal good pedal pulses.  There is no phlebitis or edema.  There is no cyanosis or clubbing.  Strength is normal and symmetrical in all extremities.  There is no lateralizing weakness.  There are no sensory deficits.  The skin is warm and dry.  There is no rash.  EKG shows normal sinus rhythm and is within normal limits.   Assessment / Plan: Continue same medication except increase losartan up to 100 mg daily.  Recheck in 6 months for followup office visit

## 2013-02-11 NOTE — Assessment & Plan Note (Signed)
The patient has not had any recurrent episodes of TIA or stroke

## 2013-02-11 NOTE — Patient Instructions (Signed)
Increase your Losartan to 100 mg daily  Your physician wants you to follow-up in: 6 month ov You will receive a reminder letter in the mail two months in advance. If you don't receive a letter, please call our office to schedule the follow-up appointment.

## 2013-02-20 ENCOUNTER — Other Ambulatory Visit: Payer: Self-pay

## 2013-03-02 ENCOUNTER — Other Ambulatory Visit: Payer: Self-pay | Admitting: Oncology

## 2013-03-02 DIAGNOSIS — D699 Hemorrhagic condition, unspecified: Secondary | ICD-10-CM

## 2013-03-03 ENCOUNTER — Telehealth: Payer: Self-pay | Admitting: Oncology

## 2013-03-03 NOTE — Telephone Encounter (Signed)
Lb already on schedule for 11/18. No other orders.

## 2013-03-03 NOTE — Telephone Encounter (Signed)
S/W PT AND GVE LAB APPT FOR 11/18 @ 2:30 PER MD ORDERS.

## 2013-03-04 ENCOUNTER — Telehealth: Payer: Self-pay | Admitting: Oncology

## 2013-03-04 ENCOUNTER — Other Ambulatory Visit: Payer: Medicare Other | Admitting: Lab

## 2013-03-04 DIAGNOSIS — D699 Hemorrhagic condition, unspecified: Secondary | ICD-10-CM

## 2013-03-04 DIAGNOSIS — M3501 Sicca syndrome with keratoconjunctivitis: Secondary | ICD-10-CM

## 2013-03-04 NOTE — Telephone Encounter (Signed)
Called pt,left message regarding lab on 12/3

## 2013-03-19 ENCOUNTER — Other Ambulatory Visit (HOSPITAL_BASED_OUTPATIENT_CLINIC_OR_DEPARTMENT_OTHER): Payer: Medicare Other | Admitting: Lab

## 2013-03-19 DIAGNOSIS — M3500 Sicca syndrome, unspecified: Secondary | ICD-10-CM

## 2013-03-19 DIAGNOSIS — D699 Hemorrhagic condition, unspecified: Secondary | ICD-10-CM

## 2013-03-19 LAB — CBC & DIFF AND RETIC
Basophils Absolute: 0 10*3/uL (ref 0.0–0.1)
Eosinophils Absolute: 0.2 10*3/uL (ref 0.0–0.5)
HCT: 34.9 % (ref 34.8–46.6)
HGB: 11.8 g/dL (ref 11.6–15.9)
Immature Retic Fract: 5.6 % (ref 1.60–10.00)
MCH: 32.2 pg (ref 25.1–34.0)
MCV: 95.4 fL (ref 79.5–101.0)
MONO#: 0.4 10*3/uL (ref 0.1–0.9)
MONO%: 10.3 % (ref 0.0–14.0)
NEUT#: 2.3 10*3/uL (ref 1.5–6.5)
RDW: 13 % (ref 11.2–14.5)
Retic %: 1.19 % (ref 0.70–2.10)
Retic Ct Abs: 43.55 10*3/uL (ref 33.70–90.70)
lymph#: 1.4 10*3/uL (ref 0.9–3.3)

## 2013-03-19 LAB — MORPHOLOGY: PLT EST: ADEQUATE

## 2013-03-20 LAB — PLATELET AGGREGATION STUDY, BLOOD
ADP10: NORMAL
ADP5: NORMAL
Arachidonic Acid: NORMAL
Ristocetin: NORMAL

## 2013-03-27 LAB — SJOGREN'S SYNDROME ANTIBODS(SSA + SSB)
SSA (Ro) (ENA) Antibody, IgG: 14 AU/mL (ref <30)
SSB (La) (ENA) Antibody, IgG: 8 AU/mL (ref <30)

## 2013-04-02 ENCOUNTER — Telehealth: Payer: Self-pay | Admitting: *Deleted

## 2013-04-02 NOTE — Telephone Encounter (Signed)
Spoke with patient.  Let her know that platelet function is normal.  She appreciated the call.  Report back to Dr. Cyndie Chime.

## 2013-04-17 HISTORY — PX: OTHER SURGICAL HISTORY: SHX169

## 2013-04-23 ENCOUNTER — Ambulatory Visit: Payer: Medicare Other | Admitting: Nurse Practitioner

## 2013-04-28 ENCOUNTER — Other Ambulatory Visit (HOSPITAL_COMMUNITY): Payer: Self-pay | Admitting: Gynecology

## 2013-04-28 DIAGNOSIS — Z1231 Encounter for screening mammogram for malignant neoplasm of breast: Secondary | ICD-10-CM

## 2013-04-30 LAB — HM PAP SMEAR: HM Pap smear: NORMAL

## 2013-04-30 LAB — HM MAMMOGRAPHY: HM MAMMO: NORMAL

## 2013-05-08 ENCOUNTER — Ambulatory Visit (HOSPITAL_COMMUNITY)
Admission: RE | Admit: 2013-05-08 | Discharge: 2013-05-08 | Disposition: A | Discharge status: Home / Self Care | Payer: Medicare Other | Source: Ambulatory Visit | Attending: Gynecology | Admitting: Gynecology

## 2013-05-08 DIAGNOSIS — Z1231 Encounter for screening mammogram for malignant neoplasm of breast: Secondary | ICD-10-CM

## 2013-05-22 LAB — CBC AND DIFFERENTIAL: Hemoglobin: 11.1 g/dL — AB (ref 12.0–16.0)

## 2013-06-16 ENCOUNTER — Encounter: Payer: Self-pay | Admitting: Oncology

## 2013-07-18 ENCOUNTER — Other Ambulatory Visit: Payer: Self-pay | Admitting: Internal Medicine

## 2013-09-15 ENCOUNTER — Other Ambulatory Visit (INDEPENDENT_AMBULATORY_CARE_PROVIDER_SITE_OTHER): Payer: Medicare Other

## 2013-09-15 ENCOUNTER — Encounter: Payer: Self-pay | Admitting: Internal Medicine

## 2013-09-15 ENCOUNTER — Ambulatory Visit (INDEPENDENT_AMBULATORY_CARE_PROVIDER_SITE_OTHER): Payer: Medicare Other | Admitting: Internal Medicine

## 2013-09-15 VITALS — BP 110/60 | HR 70 | Temp 97.8°F | Resp 16 | Ht 62.5 in | Wt 156.2 lb

## 2013-09-15 DIAGNOSIS — D510 Vitamin B12 deficiency anemia due to intrinsic factor deficiency: Secondary | ICD-10-CM

## 2013-09-15 DIAGNOSIS — I10 Essential (primary) hypertension: Secondary | ICD-10-CM

## 2013-09-15 DIAGNOSIS — R7309 Other abnormal glucose: Secondary | ICD-10-CM

## 2013-09-15 DIAGNOSIS — E785 Hyperlipidemia, unspecified: Secondary | ICD-10-CM

## 2013-09-15 DIAGNOSIS — Z0000 Encounter for general adult medical examination without abnormal findings: Secondary | ICD-10-CM | POA: Insufficient documentation

## 2013-09-15 DIAGNOSIS — R7303 Prediabetes: Secondary | ICD-10-CM

## 2013-09-15 DIAGNOSIS — I119 Hypertensive heart disease without heart failure: Secondary | ICD-10-CM

## 2013-09-15 DIAGNOSIS — D518 Other vitamin B12 deficiency anemias: Secondary | ICD-10-CM

## 2013-09-15 DIAGNOSIS — M48061 Spinal stenosis, lumbar region without neurogenic claudication: Secondary | ICD-10-CM

## 2013-09-15 DIAGNOSIS — D519 Vitamin B12 deficiency anemia, unspecified: Secondary | ICD-10-CM | POA: Insufficient documentation

## 2013-09-15 DIAGNOSIS — K219 Gastro-esophageal reflux disease without esophagitis: Secondary | ICD-10-CM

## 2013-09-15 LAB — LIPID PANEL
CHOL/HDL RATIO: 2
CHOLESTEROL: 192 mg/dL (ref 0–200)
HDL: 86.8 mg/dL (ref >39.00)
LDL Cholesterol: 66 mg/dL (ref 0–99)
TRIGLYCERIDES: 194 mg/dL — AB (ref 0.0–149.0)
VLDL: 38.8 mg/dL (ref 0.0–40.0)

## 2013-09-15 LAB — CBC WITH DIFFERENTIAL/PLATELET
BASOS ABS: 0 10*3/uL (ref 0.0–0.1)
Basophils Relative: 0.5 % (ref 0.0–3.0)
Eosinophils Absolute: 0.2 10*3/uL (ref 0.0–0.7)
Eosinophils Relative: 3.7 % (ref 0.0–5.0)
HCT: 37 % (ref 36.0–46.0)
HEMOGLOBIN: 12.4 g/dL (ref 12.0–15.0)
Lymphocytes Relative: 25.8 % (ref 12.0–46.0)
Lymphs Abs: 1.2 10*3/uL (ref 0.7–4.0)
MCHC: 33.6 g/dL (ref 30.0–36.0)
MCV: 97.4 fl (ref 78.0–100.0)
MONOS PCT: 9.1 % (ref 3.0–12.0)
Monocytes Absolute: 0.4 10*3/uL (ref 0.1–1.0)
NEUTROS ABS: 2.9 10*3/uL (ref 1.4–7.7)
Neutrophils Relative %: 60.9 % (ref 43.0–77.0)
PLATELETS: 233 10*3/uL (ref 150.0–400.0)
RBC: 3.8 Mil/uL — ABNORMAL LOW (ref 3.87–5.11)
RDW: 13.4 % (ref 11.5–15.5)
WBC: 4.7 10*3/uL (ref 4.0–10.5)

## 2013-09-15 LAB — COMPREHENSIVE METABOLIC PANEL
ALT: 17 U/L (ref 0–35)
AST: 15 U/L (ref 0–37)
Albumin: 4.2 g/dL (ref 3.5–5.2)
Alkaline Phosphatase: 54 U/L (ref 39–117)
BUN: 16 mg/dL (ref 6–23)
CHLORIDE: 104 meq/L (ref 96–112)
CO2: 27 meq/L (ref 19–32)
CREATININE: 1 mg/dL (ref 0.4–1.2)
Calcium: 10 mg/dL (ref 8.4–10.5)
GFR: 57.69 mL/min — AB (ref >60.00)
Glucose, Bld: 113 mg/dL — ABNORMAL HIGH (ref 70–99)
POTASSIUM: 4 meq/L (ref 3.5–5.1)
Sodium: 139 mEq/L (ref 135–145)
Total Bilirubin: 0.6 mg/dL (ref 0.2–1.2)
Total Protein: 6.6 g/dL (ref 6.0–8.3)

## 2013-09-15 LAB — FERRITIN: Ferritin: 48.9 ng/mL (ref 10.0–291.0)

## 2013-09-15 LAB — HEMOGLOBIN A1C: Hgb A1c MFr Bld: 6.3 % (ref 4.6–6.5)

## 2013-09-15 LAB — FOLATE: FOLATE: 14.2 ng/mL (ref >5.9)

## 2013-09-15 LAB — VITAMIN B12: Vitamin B-12: 155 pg/mL — ABNORMAL LOW (ref 211–911)

## 2013-09-15 LAB — IBC PANEL
IRON: 98 ug/dL (ref 42–145)
SATURATION RATIOS: 27.7 % (ref 20.0–50.0)
TRANSFERRIN: 253 mg/dL (ref 212.0–360.0)

## 2013-09-15 LAB — RETICULOCYTES
ABS Retic: 42 10*3/uL (ref 19.0–186.0)
RBC.: 3.82 MIL/uL — ABNORMAL LOW (ref 3.87–5.11)
Retic Ct Pct: 1.1 % (ref 0.4–2.3)

## 2013-09-15 LAB — TSH: TSH: 1.31 u[IU]/mL (ref 0.35–4.50)

## 2013-09-15 MED ORDER — METOPROLOL TARTRATE 50 MG PO TABS: 50.0000 mg | ORAL_TABLET | ORAL | Status: DC | PRN

## 2013-09-15 MED ORDER — LOSARTAN POTASSIUM 100 MG PO TABS: 100.0000 mg | ORAL_TABLET | Freq: Two times a day (BID) | ORAL | Status: DC

## 2013-09-15 MED ORDER — TRAZODONE HCL 100 MG PO TABS: 100.0000 mg | ORAL_TABLET | Freq: Every day | ORAL | Status: DC

## 2013-09-15 MED ORDER — POTASSIUM CHLORIDE ER 10 MEQ PO TBCR: 10.0000 meq | EXTENDED_RELEASE_TABLET | Freq: Two times a day (BID) | ORAL | Status: DC

## 2013-09-15 MED ORDER — FAMOTIDINE 40 MG PO TABS: 40.0000 mg | ORAL_TABLET | Freq: Every evening | ORAL | Status: DC | PRN

## 2013-09-15 MED ORDER — ROSUVASTATIN CALCIUM 20 MG PO TABS: 20.0000 mg | ORAL_TABLET | Freq: Every day | ORAL | Status: DC

## 2013-09-15 NOTE — Assessment & Plan Note (Signed)
Her BP is well controlled 

## 2013-09-15 NOTE — Progress Notes (Signed)
Pre visit review using our clinic review tool, if applicable. No additional management support is needed unless otherwise documented below in the visit note. 

## 2013-09-15 NOTE — Assessment & Plan Note (Signed)
FLP today 

## 2013-09-15 NOTE — Assessment & Plan Note (Signed)
She will cont tramadol as needed

## 2013-09-15 NOTE — Progress Notes (Signed)
Subjective:    Patient ID: Susan Gillespie, female    DOB: [birth year], 68 y.o.   MRN: [id number]  Hypertension This is a chronic problem. The current episode started more than 1 year ago. The problem has been gradually improving since onset. The problem is controlled. Associated symptoms include anxiety. Pertinent negatives include no blurred vision, chest pain, headaches, malaise/fatigue, neck pain, orthopnea, palpitations, peripheral edema, PND, shortness of breath or sweats. Agents associated with hypertension include estrogens. Past treatments include angiotensin blockers and beta blockers. The current treatment provides significant improvement. There are no compliance problems.  Hypertensive end-organ damage includes CVA.      Review of Systems  Constitutional: Negative.  Negative for fever, chills, malaise/fatigue, diaphoresis, appetite change and fatigue.  HENT: Negative.   Eyes: Negative.  Negative for blurred vision.  Respiratory: Negative.  Negative for cough, choking, chest tightness, shortness of breath, wheezing and stridor.   Cardiovascular: Negative for chest pain, palpitations, orthopnea, leg swelling and PND.  Gastrointestinal: Negative.  Negative for nausea, vomiting, abdominal pain, diarrhea, constipation and blood in stool.  Endocrine: Negative.  Negative for polydipsia, polyphagia and polyuria.  Genitourinary: Negative.   Musculoskeletal: Positive for back pain. Negative for arthralgias, gait problem, joint swelling, myalgias, neck pain and neck stiffness.       Intermittent mild aching in her lower back Non-radiating No N/W/T  Skin: Negative.  Negative for rash.  Allergic/Immunologic: Negative.   Neurological: Negative.  Negative for headaches.  Hematological: Negative.  Negative for adenopathy. Does not bruise/bleed easily.  Psychiatric/Behavioral: Negative.        Objective:   Physical Exam  Vitals reviewed. Constitutional: She is oriented to person, place,  and time. She appears well-developed and well-nourished. No distress.  HENT:  Head: Normocephalic and atraumatic.  Mouth/Throat: Oropharynx is clear and moist. No oropharyngeal exudate.  Eyes: Conjunctivae are normal. Right eye exhibits no discharge. Left eye exhibits no discharge. No scleral icterus.  Neck: Normal range of motion. Neck supple. No JVD present. No tracheal deviation present. No thyromegaly present.  Cardiovascular: Normal rate, regular rhythm, normal heart sounds and intact distal pulses.  Exam reveals no gallop and no friction rub.   No murmur heard. Pulmonary/Chest: Effort normal and breath sounds normal. No stridor. No respiratory distress. She has no wheezes. She has no rales. She exhibits no tenderness.  Abdominal: Soft. Bowel sounds are normal. She exhibits no distension and no mass. There is no tenderness. There is no rebound and no guarding.  Musculoskeletal: Normal range of motion. She exhibits no edema and no tenderness.  Lymphadenopathy:    She has no cervical adenopathy.  Neurological: She is oriented to person, place, and time.  Skin: Skin is warm and dry. No rash noted. She is not diaphoretic. No erythema. No pallor.  Psychiatric: She has a normal mood and affect. Her behavior is normal. Judgment and thought content normal.     Lab Results  Component Value Date   WBC 4.3 03/19/2013   HGB 11.8 03/19/2013   HCT 34.9 03/19/2013   PLT 210 03/19/2013   GLUCOSE 119* 08/06/2012   CHOL 156 06/29/2012   TRIG 148 06/29/2012   HDL 65 06/29/2012   LDLCALC 61 06/29/2012   ALT 13 08/06/2012   AST 16 08/06/2012   NA 139 08/06/2012   K 4.4 08/06/2012   CL 103 08/06/2012   CREATININE 0.88 08/06/2012   BUN 18 08/06/2012   CO2 28 08/06/2012   TSH 1.09 02/05/2012   INR  0.93 06/29/2012   HGBA1C 6.1* 06/29/2012       Assessment & Plan:

## 2013-09-15 NOTE — Patient Instructions (Signed)

## 2013-09-15 NOTE — Assessment & Plan Note (Signed)
I will recheck her A1C and will advise further

## 2013-09-15 NOTE — Assessment & Plan Note (Signed)
I will recheck her CBC and will check her vitamin levels as well 

## 2013-09-15 NOTE — Assessment & Plan Note (Addendum)

## 2013-09-16 ENCOUNTER — Other Ambulatory Visit: Payer: Self-pay | Admitting: *Deleted

## 2013-09-16 ENCOUNTER — Telehealth: Payer: Self-pay | Admitting: *Deleted

## 2013-09-16 ENCOUNTER — Encounter: Payer: Self-pay | Admitting: Internal Medicine

## 2013-09-16 MED ORDER — ROSUVASTATIN CALCIUM 20 MG PO TABS: 20.0000 mg | ORAL_TABLET | Freq: Every day | ORAL | Status: DC

## 2013-09-16 MED ORDER — TRAZODONE HCL 100 MG PO TABS: 200.0000 mg | ORAL_TABLET | Freq: Every day | ORAL | Status: DC

## 2013-09-16 MED ORDER — LOSARTAN POTASSIUM 100 MG PO TABS: 50.0000 mg | ORAL_TABLET | Freq: Two times a day (BID) | ORAL | Status: DC

## 2013-09-16 NOTE — Telephone Encounter (Signed)
Pharmacy tech from [facility] called requesting clarification on Losartan and Crestor Rx.  One part of Rx says to take one tablet BID and at the end of the Rx it says to take 1/2 tab BID.  Trazadone Rx one part says to take 100mg  at bedtime and at the end it says to take 200mg  at bedtime.  Please advise

## 2013-09-16 NOTE — Telephone Encounter (Signed)
rx's have been changed

## 2013-09-18 ENCOUNTER — Encounter: Payer: Self-pay | Admitting: Internal Medicine

## 2013-09-25 ENCOUNTER — Telehealth: Payer: Self-pay | Admitting: Internal Medicine

## 2013-09-25 NOTE — Telephone Encounter (Signed)
Pt would like to know if dr. Regis Bill would accept her as a new pt, states she was highly recommended by dr. Thornell Mule. Pt has  [employer] medicare. Pt had conflict of interest with her current pcp.

## 2013-10-01 ENCOUNTER — Encounter: Payer: Self-pay | Admitting: Internal Medicine

## 2013-10-14 ENCOUNTER — Ambulatory Visit: Payer: Medicare Other | Admitting: Internal Medicine

## 2014-01-15 ENCOUNTER — Ambulatory Visit: Payer: Medicare Other | Admitting: Internal Medicine

## 2014-01-30 ENCOUNTER — Other Ambulatory Visit: Payer: Self-pay

## 2014-02-05 ENCOUNTER — Other Ambulatory Visit: Payer: Self-pay | Admitting: Family Medicine

## 2014-02-05 DIAGNOSIS — R109 Unspecified abdominal pain: Secondary | ICD-10-CM

## 2014-02-05 DIAGNOSIS — R079 Chest pain, unspecified: Secondary | ICD-10-CM

## 2014-02-10 ENCOUNTER — Ambulatory Visit
Admission: RE | Admit: 2014-02-10 | Discharge: 2014-02-10 | Disposition: A | Discharge status: Home / Self Care | Payer: Medicare Other | Source: Ambulatory Visit | Attending: Family Medicine | Admitting: Family Medicine

## 2014-02-10 DIAGNOSIS — R079 Chest pain, unspecified: Secondary | ICD-10-CM

## 2014-02-10 DIAGNOSIS — R109 Unspecified abdominal pain: Secondary | ICD-10-CM

## 2014-02-10 MED ORDER — IOHEXOL 300 MG/ML  SOLN
100.0000 mL | Freq: Once | INTRAMUSCULAR | Status: AC | PRN
  Administered 2014-02-10: 100 mL via INTRAVENOUS

## 2014-06-24 ENCOUNTER — Other Ambulatory Visit (HOSPITAL_COMMUNITY): Payer: Self-pay | Admitting: Family Medicine

## 2014-06-24 DIAGNOSIS — Z1231 Encounter for screening mammogram for malignant neoplasm of breast: Secondary | ICD-10-CM

## 2014-06-26 ENCOUNTER — Ambulatory Visit (HOSPITAL_COMMUNITY)
Admission: RE | Admit: 2014-06-26 | Discharge: 2014-06-26 | Disposition: A | Discharge status: Home / Self Care | Payer: Medicare Other | Source: Ambulatory Visit | Attending: Family Medicine | Admitting: Family Medicine

## 2014-06-26 DIAGNOSIS — Z1231 Encounter for screening mammogram for malignant neoplasm of breast: Secondary | ICD-10-CM | POA: Diagnosis not present

## 2014-09-21 ENCOUNTER — Ambulatory Visit (INDEPENDENT_AMBULATORY_CARE_PROVIDER_SITE_OTHER): Payer: Medicare Other | Admitting: Neurology

## 2014-09-21 ENCOUNTER — Encounter: Payer: Self-pay | Admitting: Neurology

## 2014-09-21 VITALS — BP 148/66 | HR 70 | Wt 162.0 lb

## 2014-09-21 DIAGNOSIS — Z8673 Personal history of transient ischemic attack (TIA), and cerebral infarction without residual deficits: Secondary | ICD-10-CM | POA: Diagnosis not present

## 2014-09-21 DIAGNOSIS — R519 Headache, unspecified: Secondary | ICD-10-CM | POA: Insufficient documentation

## 2014-09-21 DIAGNOSIS — R51 Headache: Secondary | ICD-10-CM

## 2014-09-21 NOTE — Patient Instructions (Signed)
I had a long discussion with the patient with regards to her right hemicranial head sensations and occasional headaches which are of unclear significance. Her neurological exam is fairly unremarkable. I recommend further evaluation by checking ESR, MRI scan of the brain, MRA of the brain and neck. The episodes are not frequent or severe enough to justify specific medications at the present time. If however episodes increase in severity or frequency may reconsider treatment options at that time. She was advised to continue aspirin for stroke prevention and maintain strict control of hypertension with blood pressure goal below 130/90, lipids with LDL cholesterol goal below 70 mg percent and diabetes with him about an A1c goal below 6.5%. Return for follow-up in 2 months or call earlier if necessary.

## 2014-09-21 NOTE — Progress Notes (Signed)
[facility] [address] [place] [place] [address] [phone]       OFFICE CONSULT NOTE  Susan. Susan Gillespie Date of Birth:  Dec 17, 1945 Medical Record Number:  [id number]   Referring MD:  [name]  Reason for Referral:  Headache  HPI: Susan Gillespie is a pleasant 69 year old lady whose had intermittent recurrent stereotypical episodes of right-sided head abnormal pulsation or rumbling sensation on the right side of the head. This started 3 months ago and she's had multiple episodes in the last episode was 2 weeks ago. This may last for variable period of time from 5-6 minutes to up to 5-6 hours. There are no specific triggers and the episodes can occur at any time or with any activities. She finds annoying though she states it is not painful. She feels the sensation is similar to horses galloping. In only 2 of these episodes she has had headache later but not during the episode. She does have remote history of migraine headaches but feels that she outgrew them during her menopause. She's had however 2 episodes of migraines in the last few months but feels they were unrelated to these other episodes. She denies any competing symptoms in the, speech difficulty, vision problems, vertigo, diplopia, gait, balance problems or focal weakness or numbness. She feels she is not anxious during these episodes though she does take Xanax when necessary about twice a week for an unrelated anxiety. She has history of SVTs but feels she has taken her pulse during these episodes and it is not high. 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 Carotid ultrasound and transthoracic echo were unremarkable. Vascular risk factors identified included hypertension and hyperlipidemia. She had previously been on aspirin which was changed to Plavix but presently she is  on aspirin 81 mg due to easy bruising. She states her blood pressure is well controlled though it is slightly elevated at 148/66 in office today. She is on aspirin 81 mg which is tolerating well. She is also on Crestor and had lipid profile checked a few months ago and it was fine. She is not having any significant myalgias or arthralgias.  ROS:   14 system review of systems is positive for  pulsation, roaring sensation in the head, incontinence of bladder, tremors and all other systems negative  PMH:  Past Medical History  Diagnosis Date  . Colon polyp   . Diverticulitis   . Headache(784.0)   . Hyperlipidemia   . Arthritis   . Family history of cardiovascular disease   . HTN (hypertension)   . IBS (irritable bowel syndrome)     followed by Dr. [name]  . Hypertension   . IBS (irritable bowel syndrome)   . GERD (gastroesophageal reflux disease)   . Bleeding tendency 05/16/2012    Hx post op bleeding  Normal platelet count  . Diabetes mellitus     Type II: Pt states that she is not diabetic  . Stroke   . Melanoma     removed from back 04/2013    Social History:  History   Social History  . Marital Status: Single    Spouse Name: N/A  . Number of Children: 2  . Years of Education: 14   Occupational History  . 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 Smoking status: Former Smoker    Quit date: 04/23/1985  . Smokeless tobacco: Never Used  . Alcohol Use: 2.4 oz/week    4 Glasses of wine per week  . Drug Use: No  . Sexual Activity: Yes    Birth Control/ Protection: Surgical   Other Topics Concern  . Not on file   Social History Narrative   ** Merged History Encounter **       Domestic partner   Regular exercise-no   Right handed   Caffeine use-- 2 cups coffee daily, rare soda    Medications:   Current Outpatient Prescriptions on File Prior to Visit  Medication Sig Dispense Refill  . acetaminophen (TYLENOL) 650 MG CR tablet Take  650 mg by mouth once.    . ALPRAZolam (XANAX) 0.5 MG tablet TAKE 1 TABLET BY MOUTH 3 TIMES A DAY AS NEEDED FOR SLEEP 65 tablet 2  . aspirin EC 81 MG tablet Take 1 tablet (81 mg total) by mouth daily.    . Calcium Carbonate (CALCIUM 500 PO) Take 1 tablet by mouth 2 (two) times daily.    . cetirizine (ZYRTEC) 10 MG tablet Take 10 mg by mouth every morning.     . Cholecalciferol (VITAMIN D3) 2000 UNITS TABS Take 1 tablet by mouth every morning.     . cycloSPORINE (RESTASIS) 0.05 % ophthalmic emulsion Place 1 drop into Gillespie eyes 2 (two) times daily.    . diazepam (VALIUM) 2 MG tablet     . estradiol (VIVELLE-DOT) 0.0375 MG/24HR Place 1 patch onto the skin 2 (two) times a week.    . famotidine (PEPCID) 40 MG tablet Take 1 tablet (40 mg total) by mouth at bedtime as needed for heartburn. 90 tablet 3  . Loperamide HCl (IMODIUM PO) Take 1 tablet by mouth daily.     Marland Kitchen losartan (COZAAR) 100 MG tablet Take 0.5 tablets (50 mg total) by mouth 2 (two) times daily. 180 tablet 3  . magnesium 30 MG tablet Take 30 mg by mouth every morning.    . meclizine (ANTIVERT) 25 MG tablet Take 1 tablet (25 mg total) by mouth 3 (three) times daily as needed for dizziness. 270 tablet 1  . metoprolol (LOPRESSOR) 50 MG tablet Take 1 tablet (50 mg total) by mouth as needed. 90 tablet 3  . potassium chloride (KLOR-CON 10) 10 MEQ tablet Take 1 tablet (10 mEq total) by mouth 2 (two) times daily. 180 tablet 3  . rosuvastatin (CRESTOR) 20 MG tablet Take 1 tablet (20 mg total) by mouth daily. 90 tablet 3  . traZODone (DESYREL) 100 MG tablet Take 2 tablets (200 mg total) by mouth at bedtime. 180 tablet 3   No current facility-administered medications on file prior to visit.    Allergies:   Allergies  Allergen Reactions  . Daypro [Oxaprozin] Swelling    facial  . Lisinopril     cough  . Other Itching    narcotics  . Oxaprozin     Unknown.    . Simvastatin     REACTION: myalgias and fatigue    Physical Exam General:  well developed, well nourished, seated, in no evident distress Head: head normocephalic and atraumatic.   Neck: supple with no carotid or supraclavicular bruits Cardiovascular: regular rate and rhythm, no murmurs Musculoskeletal: no deformity Skin:  no rash/petichiae. Superficial temporal artery is not tender. No scalp tenderness. Vascular:  Normal pulses all extremities  Neurologic Exam Mental Status: Awake and fully alert.  Oriented to place and time. Recent and remote memory intact. Attention span, concentration and fund of knowledge appropriate. Mood and affect appropriate.  Cranial Nerves: Fundoscopic exam reveals sharp disc margins. Pupils equal, briskly reactive to light. Extraocular movements full without nystagmus. Visual fields full to confrontation. Hearing intact. Facial sensation intact. Face, tongue, palate moves normally and symmetrically.  Motor: Normal bulk and tone. Normal strength in all tested extremity muscles. Sensory.: intact to touch , pinprick , position and vibratory sensation.  Coordination: Rapid alternating movements normal in all extremities. Finger-to-nose and heel-to-shin performed accurately bilaterally. Gait and Station: Arises from chair without difficulty. Stance is normal. Gait demonstrates normal stride length and balance . Able to heel, toe and tandem walk without difficulty.  Reflexes: 1+ and symmetric. Toes downgoing.      ASSESSMENT: 34 year lady with transient recurrent episodes of right hemicranial pulsations, rumbling sensation of unclear etiology. Migraine equivalents or TIAs is possible though less likely.     PLAN: I had a long discussion with the patient with regards to her right hemicranial head sensations and occasional headaches which are of unclear significance. Her neurological exam is fairly unremarkable. I recommend further evaluation by checking ESR, MRI scan of the brain, MRA of the brain and neck. The episodes are not frequent or  severe enough to justify specific medications at the present time. If however episodes increase in severity or frequency may reconsider treatment options at that time. She was advised to continue aspirin for stroke prevention and maintain strict control of hypertension with blood pressure goal below 130/90, lipids with LDL cholesterol goal below 70 mg percent and diabetes with him about an A1c goal below 6.5%. Return for follow-up in 2 months or call earlier if necessary.  Antony Contras, MD Note: This document was prepared with digital dictation and possible smart phrase technology. Any transcriptional errors that result from this process are unintentional.

## 2014-09-22 LAB — SEDIMENTATION RATE: Sed Rate: 2 mm/hr (ref 0–40)

## 2014-09-24 DIAGNOSIS — Z8673 Personal history of transient ischemic attack (TIA), and cerebral infarction without residual deficits: Secondary | ICD-10-CM | POA: Diagnosis not present

## 2014-09-24 DIAGNOSIS — R51 Headache: Secondary | ICD-10-CM | POA: Diagnosis not present

## 2014-09-25 ENCOUNTER — Other Ambulatory Visit: Payer: Self-pay | Admitting: Neurology

## 2014-09-25 ENCOUNTER — Telehealth: Payer: Self-pay | Admitting: Neurology

## 2014-09-25 DIAGNOSIS — Z8673 Personal history of transient ischemic attack (TIA), and cerebral infarction without residual deficits: Secondary | ICD-10-CM

## 2014-09-25 DIAGNOSIS — R519 Headache, unspecified: Secondary | ICD-10-CM

## 2014-09-25 DIAGNOSIS — R51 Headache: Principal | ICD-10-CM

## 2014-09-28 ENCOUNTER — Telehealth: Payer: Self-pay | Admitting: Neurology

## 2014-09-28 DIAGNOSIS — I771 Stricture of artery: Secondary | ICD-10-CM

## 2014-09-28 NOTE — Telephone Encounter (Signed)
Dr Leonie Man,  Ms Abrigo is requesting her MRA, MRI results from 09/25/14.   I will be happy to call her if you advise me.  I did notice some 'abnormal' results.  Let me know how I may assist.  Thank you, Verneita Griffes

## 2014-09-28 NOTE — Telephone Encounter (Signed)
Patient called requesting MRI results. She requested that the nurse call her back on her cell phone [phone]). Please call and advise.

## 2014-09-29 ENCOUNTER — Other Ambulatory Visit: Payer: Self-pay | Admitting: Neurology

## 2014-09-29 DIAGNOSIS — I6502 Occlusion and stenosis of left vertebral artery: Secondary | ICD-10-CM

## 2014-09-29 NOTE — Telephone Encounter (Signed)
I spoke to the patient and gave results of MRI scan of the brain showing mild age-related changes of small vessel disease which were advanced compared to the last scan. MRA of the brain was unremarkable. MRA of the neck suggested proximal left vertebral and subclavian narrowing but this was in an area where artifacts of common hence I recommended evaluating this further by checking a CT angiogram of the neck. Patient was in agreement and I will order the test.

## 2014-10-06 ENCOUNTER — Ambulatory Visit
Admission: RE | Admit: 2014-10-06 | Discharge: 2014-10-06 | Disposition: A | Discharge status: Home / Self Care | Payer: Medicare Other | Source: Ambulatory Visit | Attending: Neurology | Admitting: Neurology

## 2014-10-06 DIAGNOSIS — I6502 Occlusion and stenosis of left vertebral artery: Secondary | ICD-10-CM

## 2014-10-06 MED ORDER — IOPAMIDOL (ISOVUE-370) INJECTION 76%
80.0000 mL | Freq: Once | INTRAVENOUS | Status: AC | PRN
  Administered 2014-10-06: 80 mL via INTRAVENOUS

## 2014-10-07 ENCOUNTER — Institutional Professional Consult (permissible substitution): Payer: Medicare Other | Admitting: Neurology

## 2014-10-08 ENCOUNTER — Other Ambulatory Visit (HOSPITAL_COMMUNITY): Payer: Self-pay | Admitting: Interventional Radiology

## 2014-10-08 DIAGNOSIS — I6529 Occlusion and stenosis of unspecified carotid artery: Secondary | ICD-10-CM

## 2014-10-12 ENCOUNTER — Other Ambulatory Visit: Payer: Self-pay

## 2014-10-13 ENCOUNTER — Other Ambulatory Visit (HOSPITAL_COMMUNITY): Payer: Self-pay | Admitting: Interventional Radiology

## 2014-10-13 ENCOUNTER — Ambulatory Visit (HOSPITAL_COMMUNITY)
Admission: RE | Admit: 2014-10-13 | Discharge: 2014-10-13 | Disposition: A | Discharge status: Home / Self Care | Payer: Medicare Other | Source: Ambulatory Visit | Attending: Interventional Radiology | Admitting: Interventional Radiology

## 2014-10-13 ENCOUNTER — Other Ambulatory Visit: Payer: Self-pay | Admitting: Radiology

## 2014-10-13 DIAGNOSIS — I6529 Occlusion and stenosis of unspecified carotid artery: Secondary | ICD-10-CM | POA: Diagnosis not present

## 2014-10-13 DIAGNOSIS — I771 Stricture of artery: Secondary | ICD-10-CM

## 2014-10-13 LAB — PLATELET INHIBITION P2Y12: PLATELET FUNCTION P2Y12: 9 [PRU] — AB (ref 194–418)

## 2014-10-14 ENCOUNTER — Other Ambulatory Visit: Payer: Self-pay | Admitting: Radiology

## 2014-10-15 ENCOUNTER — Other Ambulatory Visit (HOSPITAL_COMMUNITY): Payer: Self-pay | Admitting: Interventional Radiology

## 2014-10-15 ENCOUNTER — Encounter (HOSPITAL_COMMUNITY): Payer: Self-pay

## 2014-10-15 ENCOUNTER — Ambulatory Visit (HOSPITAL_COMMUNITY)
Admission: RE | Admit: 2014-10-15 | Discharge: 2014-10-15 | Disposition: A | Discharge status: Home / Self Care | Payer: Medicare Other | Source: Ambulatory Visit | Attending: Interventional Radiology | Admitting: Interventional Radiology

## 2014-10-15 DIAGNOSIS — E785 Hyperlipidemia, unspecified: Secondary | ICD-10-CM | POA: Diagnosis not present

## 2014-10-15 DIAGNOSIS — J988 Other specified respiratory disorders: Secondary | ICD-10-CM | POA: Diagnosis not present

## 2014-10-15 DIAGNOSIS — Z87891 Personal history of nicotine dependence: Secondary | ICD-10-CM | POA: Diagnosis not present

## 2014-10-15 DIAGNOSIS — Z8249 Family history of ischemic heart disease and other diseases of the circulatory system: Secondary | ICD-10-CM | POA: Insufficient documentation

## 2014-10-15 DIAGNOSIS — Z86010 Personal history of colonic polyps: Secondary | ICD-10-CM | POA: Insufficient documentation

## 2014-10-15 DIAGNOSIS — I10 Essential (primary) hypertension: Secondary | ICD-10-CM | POA: Diagnosis not present

## 2014-10-15 DIAGNOSIS — K219 Gastro-esophageal reflux disease without esophagitis: Secondary | ICD-10-CM | POA: Diagnosis not present

## 2014-10-15 DIAGNOSIS — G450 Vertebro-basilar artery syndrome: Secondary | ICD-10-CM | POA: Diagnosis not present

## 2014-10-15 DIAGNOSIS — I771 Stricture of artery: Secondary | ICD-10-CM

## 2014-10-15 DIAGNOSIS — K589 Irritable bowel syndrome without diarrhea: Secondary | ICD-10-CM | POA: Diagnosis not present

## 2014-10-15 DIAGNOSIS — I6522 Occlusion and stenosis of left carotid artery: Secondary | ICD-10-CM | POA: Diagnosis not present

## 2014-10-15 DIAGNOSIS — Z85820 Personal history of malignant melanoma of skin: Secondary | ICD-10-CM | POA: Diagnosis not present

## 2014-10-15 DIAGNOSIS — Z8673 Personal history of transient ischemic attack (TIA), and cerebral infarction without residual deficits: Secondary | ICD-10-CM | POA: Insufficient documentation

## 2014-10-15 DIAGNOSIS — R51 Headache: Secondary | ICD-10-CM | POA: Diagnosis not present

## 2014-10-15 DIAGNOSIS — H9311 Tinnitus, right ear: Secondary | ICD-10-CM | POA: Diagnosis not present

## 2014-10-15 DIAGNOSIS — I708 Atherosclerosis of other arteries: Secondary | ICD-10-CM | POA: Diagnosis not present

## 2014-10-15 DIAGNOSIS — Z7902 Long term (current) use of antithrombotics/antiplatelets: Secondary | ICD-10-CM | POA: Diagnosis not present

## 2014-10-15 LAB — CBC
HCT: 34.3 % — ABNORMAL LOW (ref 36.0–46.0)
HEMOGLOBIN: 11.4 g/dL — AB (ref 12.0–15.0)
MCH: 32.1 pg (ref 26.0–34.0)
MCHC: 33.2 g/dL (ref 30.0–36.0)
MCV: 96.6 fL (ref 78.0–100.0)
Platelets: 210 10*3/uL (ref 150–400)
RBC: 3.55 MIL/uL — ABNORMAL LOW (ref 3.87–5.11)
RDW: 13.4 % (ref 11.5–15.5)
WBC: 3.7 10*3/uL — ABNORMAL LOW (ref 4.0–10.5)

## 2014-10-15 LAB — BASIC METABOLIC PANEL
Anion gap: 7 (ref 5–15)
BUN: 10 mg/dL (ref 6–20)
CHLORIDE: 101 mmol/L (ref 101–111)
CO2: 29 mmol/L (ref 22–32)
Calcium: 9.7 mg/dL (ref 8.9–10.3)
Creatinine, Ser: 1 mg/dL (ref 0.44–1.00)
GFR calc Af Amer: >60 mL/min (ref >60)
GFR calc non Af Amer: 56 mL/min — ABNORMAL LOW (ref >60)
GLUCOSE: 107 mg/dL — AB (ref 65–99)
Potassium: 4.3 mmol/L (ref 3.5–5.1)
Sodium: 137 mmol/L (ref 135–145)

## 2014-10-15 LAB — PROTIME-INR
INR: 0.99 (ref 0.00–1.49)
PROTHROMBIN TIME: 13.3 s (ref 11.6–15.2)

## 2014-10-15 LAB — APTT: APTT: 29 s (ref 24–37)

## 2014-10-15 MED ORDER — SODIUM CHLORIDE 0.9 % IV SOLN
Freq: Once | INTRAVENOUS | Status: AC
  Administered 2014-10-15: 08:00:00 via INTRAVENOUS

## 2014-10-15 MED ORDER — SODIUM CHLORIDE 0.9 % IV SOLN
INTRAVENOUS | Status: DC
Start: 2014-10-15 — End: 2014-10-16

## 2014-10-15 MED ORDER — ACETAMINOPHEN 325 MG PO TABS
ORAL_TABLET | ORAL | Status: AC
  Filled 2014-10-15: qty 2

## 2014-10-15 MED ORDER — IOHEXOL 300 MG/ML  SOLN
150.0000 mL | Freq: Once | INTRAMUSCULAR | Status: AC | PRN
  Administered 2014-10-15: 100 mL via INTRAVENOUS

## 2014-10-15 MED ORDER — HEPARIN SODIUM (PORCINE) 1000 UNIT/ML IJ SOLN
INTRAMUSCULAR | Status: AC
  Filled 2014-10-15: qty 1

## 2014-10-15 MED ORDER — MIDAZOLAM HCL 2 MG/2ML IJ SOLN
INTRAMUSCULAR | Status: AC
  Filled 2014-10-15: qty 2

## 2014-10-15 MED ORDER — FENTANYL CITRATE (PF) 100 MCG/2ML IJ SOLN
INTRAMUSCULAR | Status: AC | PRN
  Administered 2014-10-15: 25 ug via INTRAVENOUS

## 2014-10-15 MED ORDER — ACETAMINOPHEN 325 MG PO TABS
650.0000 mg | ORAL_TABLET | Freq: Once | ORAL | Status: AC
  Administered 2014-10-15: 650 mg via ORAL

## 2014-10-15 MED ORDER — FENTANYL CITRATE (PF) 100 MCG/2ML IJ SOLN
INTRAMUSCULAR | Status: AC
  Filled 2014-10-15: qty 2

## 2014-10-15 MED ORDER — MIDAZOLAM HCL 2 MG/2ML IJ SOLN
INTRAMUSCULAR | Status: AC | PRN
  Administered 2014-10-15: 1 mg via INTRAVENOUS

## 2014-10-15 MED ORDER — LIDOCAINE HCL 1 % IJ SOLN
INTRAMUSCULAR | Status: AC
  Filled 2014-10-15: qty 20

## 2014-10-15 MED ORDER — HEPARIN SODIUM (PORCINE) 1000 UNIT/ML IJ SOLN
500.0000 [IU] | Freq: Once | INTRAMUSCULAR | Status: AC
  Administered 2014-10-15: 500 [IU] via INTRAVENOUS

## 2014-10-15 NOTE — Sedation Documentation (Signed)
Patient is resting comfortably. 

## 2014-10-15 NOTE — Discharge Instructions (Signed)
Angiogram, Care After Refer to this sheet in the next few weeks. These instructions provide you with information on caring for yourself after your procedure. Your health care provider may also give you more specific instructions. Your treatment has been planned according to current medical practices, but problems sometimes occur. Call your health care provider if you have any problems or questions after your procedure.  WHAT TO EXPECT AFTER THE PROCEDURE After your procedure, it is typical to have the following sensations:  Minor discomfort or tenderness and a small bump at the catheter insertion site. The bump should usually decrease in size and tenderness within 1 to 2 weeks.  Any bruising will usually fade within 2 to 4 weeks. HOME CARE INSTRUCTIONS   You may need to keep taking blood thinners if they were prescribed for you. Take medicines only as directed by your health care provider.  Do not apply powder or lotion to the site.  Do not take baths, swim, or use a hot tub until your health care provider approves.  You may shower 24 hours after the procedure. Remove the bandage (dressing) and gently wash the site with plain soap and water. Gently pat the site dry.  Inspect the site at least twice daily.  Limit your activity for the first 48 hours. Do not bend, squat, or lift anything over 20 lb (9 kg) or as directed by your health care provider.  Plan to have someone take you home after the procedure. Follow instructions about when you can drive or return to work. SEEK MEDICAL CARE IF:  You get light-headed when standing up.  You have drainage (other than a small amount of blood on the dressing).  You have chills.  You have a fever.  You have redness, warmth, swelling, or pain at the insertion site. SEEK IMMEDIATE MEDICAL CARE IF:   You develop chest pain or shortness of breath, feel faint, or pass out.  You have bleeding, swelling larger than a walnut, or drainage from the  catheter insertion site.  You develop pain, discoloration, coldness, or severe bruising in the leg or arm that held the catheter.  You develop bleeding from any other place, such as the bowels. You may see bright red blood in your urine or stools, or your stools may appear black and tarry.  You have heavy bleeding from the site. If this happens, hold pressure on the site. MAKE SURE YOU:  Understand these instructions.  Will watch your condition.  Will get help right away if you are not doing well or get worse. Document Released: 10/20/2004 Document Revised: 08/18/2013 Document Reviewed: 08/26/2012 [facility] Patient Information 2015 [place], [place]. This information is not intended to replace advice given to you by your health care provider. Make sure you discuss any questions you have with your health care provider. Cerebral Angiogram, Care After Refer to this sheet in the next few weeks. These instructions provide you with information on caring for yourself after your procedure. Your health care provider may also give you more specific instructions. Your treatment has been planned according to current medical practices, but problems sometimes occur. Call your health care provider if you have any problems or questions after your procedure. WHAT TO EXPECT AFTER THE PROCEDURE After your procedure, it is typical to have the following:   Small lump at the insertion site.  Tenderness or bruising at the insertion site.  Mild headache. HOME CARE INSTRUCTIONS  Rest at home for the first day after your procedure.  Do not drive or operate heavy machinery as directed by your health care provider.  Do not exercise as directed by your health care provider.  Do not lift objects more than 10 pounds as directed by your health care provider.  Take medicines only as directed by your health care provider.  Follow your health care provider's instructions on:  Incision care.  Bandage (dressing)  changes and removal.  Check daily for signs of infection at the insertion site, such as redness, swelling, or discharge.  Drink enough water to keep your urine clear or pale yellow. This will help flush out the contrast dye.  Keep all follow-up visits as directed by your health care provider. This is important. SEEK MEDICAL CARE IF:  You have a fever.  The skin at the insertion site begins to separate.  You have drainage, redness, swelling, or pain at the insertion site.  You are dizzy.  You have a rash.  You are itchy.  You have difficulty urinating. SEEK IMMEDIATE MEDICAL CARE IF:  You have weakness in the face, arms, or legs.   You have difficulty talking or swallowing.   You have vision problems.   You have sudden swelling or pain at the insertion site.  You have a rash.   You have difficulty using the arm or leg in which the catheter was inserted.   You have chest pain.  You have difficulty breathing. Document Released: 08/18/2013 Document Reviewed: 04/16/2013 [facility] Patient Information 2015 [place]. This information is not intended to replace advice given to you by your health care provider. Make sure you discuss any questions you have with your health care provider.

## 2014-10-15 NOTE — H&P (Signed)
Chief Complaint: Rt tinnitus Headaches   Referring Physician(s): Dr Leonie Man  History of Present Illness: Susan Gillespie is a 69 y.o. 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 Colon polyp   . Diverticulitis   . Headache(784.0)   . Hyperlipidemia   . Arthritis   . Family history of cardiovascular disease   . HTN (hypertension)   . IBS (irritable bowel syndrome)     followed by Dr. [name]  . Hypertension   . IBS (irritable bowel syndrome)   . GERD (gastroesophageal reflux disease)   . Bleeding tendency 05/16/2012    Hx post op bleeding  Normal platelet count  . Diabetes mellitus     Type II: Pt states that she is not diabetic  . Stroke   . Melanoma     removed from back 04/2013    Past Surgical History  Procedure Laterality Date  . Spine surgery      cervical fusion  . Colon surgery    . Abdominal hysterectomy    . Tonsillectomy    . Cholecystectomy    . Appendectomy    . Skin cancer excised  04/2013    Allergies: Daypro; Lisinopril; Other; Oxaprozin; and Simvastatin  Medications: Prior to Admission medications   Medication Sig Start Date End Date Taking? Authorizing Provider  acetaminophen (TYLENOL) 650 MG CR tablet Take 1,300 mg by mouth every 8 (eight) hours as needed for pain.    Yes Historical Provider, MD  ALPRAZolam (XANAX) 0.5 MG tablet TAKE 1 TABLET BY MOUTH 3 TIMES A DAY AS NEEDED FOR SLEEP   Yes Janith Lima, MD  Calcium Carbonate (CALCIUM 500 PO) Take 1 tablet by mouth 2 (two) times daily.   Yes Historical Provider, MD  cetirizine (ZYRTEC) 10 MG tablet Take 10 mg by mouth every morning.    Yes Historical Provider, MD  Cholecalciferol (VITAMIN D3) 2000  UNITS TABS Take 1 tablet by mouth every morning.    Yes Historical Provider, MD  clopidogrel (PLAVIX) 75 MG tablet Take 75 mg by mouth daily.   Yes Historical Provider, MD  cycloSPORINE (RESTASIS) 0.05 % ophthalmic emulsion Place 1 drop into both eyes 2 (two) times daily.   Yes Historical Provider, MD  estradiol (VIVELLE-DOT) 0.0375 MG/24HR Place 1 patch onto the skin as needed (Uses for hot flashes).    Yes Historical Provider, MD  famotidine (PEPCID) 40 MG tablet Take 1 tablet (40 mg total) by mouth at bedtime as needed for heartburn. 09/15/13  Yes Janith Lima, MD  fluticasone (FLONASE) 50 MCG/ACT nasal spray Place 1 spray into both nostrils daily.   Yes Historical Provider, MD  gabapentin (NEURONTIN) 300 MG capsule Take 300 mg by mouth 3 (three) times daily.   Yes Historical Provider, MD  Loperamide HCl (IMODIUM PO) Take 1 tablet by mouth daily.    Yes Historical Provider, MD  losartan (COZAAR) 100 MG tablet Take 0.5 tablets (50 mg total) by mouth 2 (two) times daily. 09/16/13  Yes Janith Lima, MD  Magnesium 250 MG TABS Take 1 tablet by mouth daily.   Yes Historical Provider, MD  potassium chloride (KLOR-CON 10) 10 MEQ tablet Take 1 tablet (10 mEq  total) by mouth 2 (two) times daily. 09/15/13  Yes Janith Lima, MD  rosuvastatin (CRESTOR) 20 MG tablet Take 1 tablet (20 mg total) by mouth daily. 09/16/13  Yes Janith Lima, MD  SUMAtriptan (IMITREX) 50 MG tablet Take 50 mg by mouth every 2 (two) hours as needed for migraine. May repeat in 2 hours if headache persists or recurs.   Yes Historical Provider, MD  traZODone (DESYREL) 100 MG tablet Take 2 tablets (200 mg total) by mouth at bedtime. Patient taking differently: Take 100 mg by mouth at bedtime.  09/16/13  Yes Janith Lima, MD  metoprolol (LOPRESSOR) 50 MG tablet Take 1 tablet (50 mg total) by mouth as needed. 09/15/13   Janith Lima, MD     Family History  Problem Relation Age of Onset  . Hyperlipidemia Other   . Hypertension Other   .  Cancer Other     lung, esophagus, stomach  . Stroke Other   . Heart disease Father   . Heart disease Sister   . Heart attack Sister   . Heart disease Brother   . Heart attack Brother   . Stroke Son     History   Social History  . Marital Status: Single    Spouse Name: N/A  . Number of Children: 2  . Years of Education: 14   Occupational History  . 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 Smoking status: Former Smoker    Quit date: 04/23/1985  . Smokeless tobacco: Never Used  . Alcohol Use: 2.4 oz/week    4 Glasses of wine per week  . Drug Use: No  . Sexual Activity: Yes    Birth Control/ Protection: Surgical   Other Topics Concern  . None   Social History Narrative   ** Merged History Encounter **       Domestic partner   Regular exercise-no   Right handed   Caffeine use-- 2 cups coffee daily, rare soda     Review of Systems: A 12 point ROS discussed and pertinent positives are indicated in the HPI above.  All other systems are negative.  Review of Systems  Constitutional: Negative for fever and activity change.  HENT: Positive for tinnitus.   Eyes: Negative for visual disturbance.  Respiratory: Negative for shortness of breath.   Gastrointestinal: Negative for abdominal pain.  Musculoskeletal: Negative for back pain and neck pain.  Neurological: Negative for weakness.  Psychiatric/Behavioral: Negative for behavioral problems and confusion.    Vital Signs: BP 130/56 mmHg  Pulse 62  Temp(Src) 97.4 F (36.3 C)  Ht 5\' 3"  (1.6 m)  Wt 160 lb (72.576 kg)  BMI 28.35 kg/m2  SpO2 99%  Physical Exam  Nursing note and vitals reviewed.   Mallampati Score:  MD Evaluation Airway: WNL Heart: WNL Abdomen: WNL Chest/ Lungs: WNL ASA  Classification: 3 Mallampati/Airway Score: Two  Imaging: Ct Angio Neck W/cm &/or Wo/cm  10/06/2014   CLINICAL DATA:  Evaluate for chronic posterior fossa ischemia.  EXAM: CT ANGIOGRAPHY  NECK  TECHNIQUE: Multidetector CT imaging of the neck was performed using the standard protocol during bolus administration of intravenous contrast. Multiplanar CT image reconstructions and MIPs were obtained to evaluate the vascular anatomy. Carotid stenosis measurements (when applicable) are obtained utilizing NASCET criteria, using the distal internal carotid diameter as the denominator.  CONTRAST:  80 mL Isovue 370. BUN and creatinine were obtained on site at  [facility] at [address]Results: BUN 22.0 mg/dL, Creatinine 1.0 mg/dL.  COMPARISON:  MRA extracranial 09/17/2012.  FINDINGS: Aortic arch: Standard branching. Imaged portion shows no evidence of aneurysm or dissection. No significant stenosis of the innominate, BILATERAL common carotid, or RIGHT subclavian. In the LEFT subclavian, there is a severe calcific stenosis at the origin, estimated 90%, flow reducing.  Right carotid system: Mild calcific plaque without ulceration. No evidence of dissection, stenosis (50% or greater) or occlusion.  Left carotid system: Moderate calcific plaque, without ulceration. No evidence of dissection, stenosis (50% or greater) or occlusion.  Vertebral arteries: Codominant. No ostial stenosis. No evidence of dissection, stenosis (50% or greater) or occlusion.  Other neck: Cervical spondylosis. Previous cervical fusion C5-C7. Suspected adjacent segment disease C4-C5. No sinus disease. No neck masses.  Compared with priors, a severe LEFT subclavian stenosis was present on the previous MRI and may be slightly increased.  IMPRESSION: Flow reducing stenosis in the proximal LEFT subclavian estimated 90%. This was present in 2014 but may be increased.   Electronically Signed   By: Staci Righter M.D.   On: 10/06/2014 16:20   Mr Virgel Paling [facility] Contrast  09/25/2014     [facility] [address], [place], [place] [address] [phone]  NEUROIMAGING REPORT   STUDY DATE: 09/24/2014 PATIENT NAME:  Susan Gillespie DOB: [birth year] MRN: [id number]  ORDERING CLINICIAN: Dr Leonie Man CLINICAL HISTORY:  29 year patient with headache and tinnitus COMPARISON FILMS:  none EXAM: MRA Brain wo TECHNIQUE: MR angiogram of the head was obtained utilizing 3D time of flight sequences from below the vertebrobasilar junction up to the intracranial vasculature without contrast.  Computerized reconstructions were obtained.  CONTRAST: none IMAGING SITE: [facility] [place]:  This study is of adequate technical quality. Flow signal of the bilateral internal carotid arteries have no stenosis. The bilateral middle and anterior cerebral arteries have no stenosis. The bilateral vertebral, basilar, bilateral posterior cerebral arteries have no stenosis. The right posterior cerebral artery has a persistent fetal pattern of origin. No aneurysmal dilatations are seen.      IMPRESSION:  Unremarkable MRA of the brain showing no significant stenosis of medium and large size intracranial vessels. 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    INTERPRETING PHYSICIAN:  Antony Contras, MD Certified in  Neuroimaging by [place] [facility] [place] for Neurological Subspecialities    Mr Angiogram Neck [facility] Contrast  09/25/2014     [facility] [address], [place], [place] [address] [phone]  NEUROIMAGING REPORT   STUDY DATE: 09/24/2014 PATIENT NAME: Susan Gillespie DOB: March 23, 1946 MRN: [id number]  ORDERING CLINICIAN: Dr Leonie Man CLINICAL HISTORY: 61 year lady with headache and pulsatile tinnitus COMPARISON FILMS: MRA neck 09/17/2012 EXAM: MRA neck w/wo TECHNIQUE: MR angiogram of the neck was obtained utilizing 3D time-of-flight sequences from the aortic arch up to the intracranial vasculature postbolus contrast infusion.  2D time-of-flight noncontrast views and computerized reconstructions were obtained.  CONTRAST: 8 ml iv Gadavist  IMAGING SITE: [facility] [place]:  The great vessels of the neck show normal pattern of origin from the aorta. The right brachiocephalic and subclavian artery appears normal. Right common carotid artery up is normal. The right carotid bifurcation shows no significant plaque or stenosis. Left common carotid artery shows normal flow. The left carotid bifurcation shows a focal plaque at the origin  of the left internal carotid artery with about 30% stenosis. The left external carotid artery shows severe plaque and high-grade stenosis at its origin. The left subclavian artery shows high-grade stenosis at its origin and has patent flow. The left vertebral artery appears to have signal loss in proximal segment likely from high-grade stenosis though artifacts are also common and location . The left vertebral artery appears to be diminutive vessel throughout but has flow all the way to the intracranial portion.     IMPRESSION:  Abnormal MRA of the neck showing focal plaque at the origin of the left internal carotid artery with mild less than 30% stenosis and high-grade stenosis of the left external carotid artery. Probable high-grade stenosis of proximal left vertebral and subclavian artery origins as well. Overall progression of atherosclerotic changes compared with previous MRI dated 09/17/2012  INTERPRETING PHYSICIAN:  PRAMOD SETHI, MD Certified in  Neuroimaging by [place] [facility] and [employer] for Neurological Subspecialities    Mr Jeri Cos [facility] Contrast  09/25/2014     [place] [address], [place], [place] [address] [phone]  NEUROIMAGING REPORT   STUDY DATE: 09/24/2014 PATIENT NAME: Susan Gillespie DOB: Sep 08, 1945 MRN: [id number]  ORDERING CLINICIAN:  Dr Leonie Man CLINICAL HISTORY:  33 year patient with headache and tinnitus COMPARISON FILMS: MRI Brain 08/06/2012 EXAM: MRI Brain w/wo TECHNIQUE: MRI of the brain with and without contrast was obtained  utilizing 5 mm axial slices with T1, T2, T2 flair, T2 star gradient echo and diffusion weighted views.  T1 sagittal, T2 coronal and postcontrast views in the axial and coronal plane were obtained.  CONTRAST:  8 ml iv Gadavist IMAGING SITE: [facility] [place]:  The brain parenchyma shows mild periventricular and subcortical nonspecific white matter hyperintensities on T2/flair images which are likely due to changes of chronic microvascular ischemia. No structural lesion, tumor infarcts are noted.No abnormal lesions are seen on diffusion-weighted views to suggest acute ischemia. The cortical sulci, fissures and cisterns are normal in size and appearance. Lateral, third and fourth ventricle are normal in size and appearance. No extra-axial fluid collections are seen. No evidence of mass effect or midline shift.  No abnormal lesions are seen on post contrast views.  On sagittal views the posterior fossa, pituitary gland and corpus callosum are unremarkable. No evidence of intracranial hemorrhage on gradient-echo views. The orbits and their contents, paranasal sinuses and calvarium are unremarkable.  Intracranial flow voids are present. Both internal auditory canals show normal appearance without any abnormal lesions or enhancement noted.     IMPRESSION: Mildly abnormal MRI scan of the brain showing mild age-related changes of chronic microvascular ischemia which appears to have progressed compared with previous MRI scan dated 08/06/2012    INTERPRETING PHYSICIAN:  PRAMOD SETHI, MD Certified in  Neuroimaging by [place] [facility] and [employer] for Neurological Subspecialities     Labs:  CBC:  Recent Labs  10/15/14 0731  WBC 3.7*  HGB 11.4*  HCT 34.3*  PLT 210    COAGS:  Recent Labs  10/15/14 0731  INR 0.99  APTT 29    BMP: No results for input(s): NA, K, CL, CO2, GLUCOSE, BUN, CALCIUM, CREATININE, GFRNONAA, GFRAA in the last 8760 hours.  Invalid input(s):  CMP  LIVER FUNCTION TESTS: No results for input(s): BILITOT, AST, ALT, ALKPHOS, PROT, ALBUMIN in the last 8760 hours.  TUMOR MARKERS: No results for input(s): AFPTM, CEA, CA199, CHROMGRNA in the last 8760  hours.  Assessment and Plan:  Rt tinnitus Headaches CTA reveals L SCA and possible vertebral artery stenosis Now scheduled for cerebral arteriogram Risks and Benefits discussed with the patient including, but not limited to bleeding, infection, vascular injury, contrast induced renal failure, stroke or even death. All of the patient's questions were answered, patient is agreeable to proceed. Consent signed and in chart.  Thank you for this interesting consult.  I greatly enjoyed meeting Susan Gillespie and look forward to participating in their care.  Signed: Marquel Spoto A 10/15/2014, 7:58 AM   I spent a total of  20 Minutes   in face to face in clinical consultation, greater than 50% of which was counseling/coordinating care for cerebral arteriogram

## 2014-10-15 NOTE — Procedures (Signed)
S/P 4 vessel cerebral arteriogram. RT CFA approach. Findings. 1.LT SCA  Steal  With 70 % stenosis of LT SCA prox. 2.Approx 50 stenosis of LT ICA prox

## 2014-10-20 ENCOUNTER — Telehealth (HOSPITAL_COMMUNITY): Payer: Self-pay

## 2014-10-20 NOTE — Telephone Encounter (Signed)
Called pt to have her change plavix to 37.5mg  daily per Dr. Estanislado Pandy..the patient agreed and asked about when her procedure would be scheduled. AW

## 2014-10-22 ENCOUNTER — Telehealth (HOSPITAL_COMMUNITY): Payer: Self-pay

## 2014-10-22 NOTE — Telephone Encounter (Signed)
Called to have the pt come in for bloodwork in 2 weeks. Pt agreed to come in around 11/05/14. AW

## 2014-10-23 ENCOUNTER — Other Ambulatory Visit (HOSPITAL_COMMUNITY): Payer: Self-pay | Admitting: Interventional Radiology

## 2014-10-28 ENCOUNTER — Other Ambulatory Visit: Payer: Self-pay | Admitting: Physician Assistant

## 2014-11-02 ENCOUNTER — Encounter (HOSPITAL_COMMUNITY): Payer: Self-pay

## 2014-11-02 ENCOUNTER — Encounter (HOSPITAL_COMMUNITY)
Admission: RE | Admit: 2014-11-02 | Discharge: 2014-11-02 | Disposition: A | Discharge status: Home / Self Care | Payer: Medicare Other | Source: Ambulatory Visit | Attending: Interventional Radiology | Admitting: Interventional Radiology

## 2014-11-02 ENCOUNTER — Other Ambulatory Visit: Payer: Self-pay | Admitting: Radiology

## 2014-11-02 ENCOUNTER — Other Ambulatory Visit: Payer: Self-pay

## 2014-11-02 DIAGNOSIS — Z01818 Encounter for other preprocedural examination: Secondary | ICD-10-CM | POA: Diagnosis not present

## 2014-11-02 DIAGNOSIS — I771 Stricture of artery: Secondary | ICD-10-CM | POA: Insufficient documentation

## 2014-11-02 HISTORY — DX: Urgency of urination: R39.15

## 2014-11-02 HISTORY — DX: Unspecified fracture of shaft of right fibula, initial encounter for closed fracture: S82.401A

## 2014-11-02 HISTORY — DX: Other specified diseases of liver: K76.89

## 2014-11-02 HISTORY — DX: Supraventricular tachycardia, unspecified: I47.10

## 2014-11-02 HISTORY — DX: Anxiety disorder, unspecified: F41.9

## 2014-11-02 HISTORY — DX: Supraventricular tachycardia: I47.1

## 2014-11-02 HISTORY — DX: Reserved for inherently not codable concepts without codable children: IMO0001

## 2014-11-02 LAB — BASIC METABOLIC PANEL
Anion gap: 7 (ref 5–15)
BUN: 11 mg/dL (ref 6–20)
CO2: 29 mmol/L (ref 22–32)
Calcium: 10.4 mg/dL — ABNORMAL HIGH (ref 8.9–10.3)
Chloride: 105 mmol/L (ref 101–111)
Creatinine, Ser: 1.02 mg/dL — ABNORMAL HIGH (ref 0.44–1.00)
GFR calc Af Amer: >60 mL/min (ref >60)
GFR calc non Af Amer: 54 mL/min — ABNORMAL LOW (ref >60)
GLUCOSE: 109 mg/dL — AB (ref 65–99)
Potassium: 4.7 mmol/L (ref 3.5–5.1)
Sodium: 141 mmol/L (ref 135–145)

## 2014-11-02 LAB — CBC WITH DIFFERENTIAL/PLATELET
Basophils Absolute: 0 10*3/uL (ref 0.0–0.1)
Basophils Relative: 0 % (ref 0–1)
EOS PCT: 2 % (ref 0–5)
Eosinophils Absolute: 0.1 10*3/uL (ref 0.0–0.7)
HCT: 36.5 % (ref 36.0–46.0)
Hemoglobin: 12.1 g/dL (ref 12.0–15.0)
LYMPHS PCT: 20 % (ref 12–46)
Lymphs Abs: 1 10*3/uL (ref 0.7–4.0)
MCH: 32 pg (ref 26.0–34.0)
MCHC: 33.2 g/dL (ref 30.0–36.0)
MCV: 96.6 fL (ref 78.0–100.0)
MONO ABS: 0.4 10*3/uL (ref 0.1–1.0)
MONOS PCT: 7 % (ref 3–12)
NEUTROS ABS: 3.5 10*3/uL (ref 1.7–7.7)
Neutrophils Relative %: 71 % (ref 43–77)
PLATELETS: 233 10*3/uL (ref 150–400)
RBC: 3.78 MIL/uL — ABNORMAL LOW (ref 3.87–5.11)
RDW: 13.1 % (ref 11.5–15.5)
WBC: 4.9 10*3/uL (ref 4.0–10.5)

## 2014-11-02 LAB — PROTIME-INR
INR: 0.97 (ref 0.00–1.49)
Prothrombin Time: 13.1 seconds (ref 11.6–15.2)

## 2014-11-02 LAB — APTT: aPTT: 29 seconds (ref 24–37)

## 2014-11-02 NOTE — Progress Notes (Signed)
Spoke with PA, stated she would place orders prior to patient's appt later today.

## 2014-11-02 NOTE — Pre-Procedure Instructions (Signed)
    Susan Gillespie  11/02/2014      [facility] - [place], [place] [address] Phone: [phone] Fax: [phone]  [id number] - [place], [place] - [address] [place] [address] Phone: [phone] Fax: [phone]  [facility] [place], [place] [address] Phone: [phone] Fax: [phone]    Your procedure is scheduled on 11/09/2014.  Report to [facility] Admitting at 630 A.M.  Call this number if you have problems in the days leading up to your surgery:  [phone]  Call this number if you have problems the morning of surgery:  [phone]   Remember:  Do not eat food or drink liquids after midnight.  Take these medicines the morning of surgery with A SIP OF WATER: Tylenol (Acetaminophen), Alprazolam (Xanax), Cetirizine (Zyrtec), Cyclosporine (Restasis), Famotidine (Pepcid), Fluticasone (Flonase), Gabapentin (Neurontin), Metoprolol (Lopressor)   STOP: ALL Vitamins, Supplements, Herbal Medications, Fish Oils, Aspirins, NSAIDs (Nonsteroidal Anti-inflammatories such as Ibuprofen, Aleve, or Advil), and Goody's/BC Powders 7 days prior to surgery, until after surgery as directed by your physician.    Take PLAVIX (CLOPIDOGREL) as directed by Dr. Arlean Hopping office.    Do not wear jewelry, make-up or nail polish.  Do not wear lotions, powders, or perfumes.  You may wear deodorant.  Do not shave 48 hours prior to surgery.   Do not bring valuables to the hospital.  [facility] is not responsible for any belongings or valuables.  Contacts, dentures or bridgework may not be worn into surgery.  Leave your suitcase in the car.  After surgery it may be brought to your room.  For patients admitted to the hospital, discharge time will be determined by your treatment team.  Patients  discharged the day of surgery will not be allowed to drive home.    Special instructions:  Please follow these instructions carefully:  1. Shower with CHG Soap the night before surgery and the morning of Surgery. 2. If you choose to wash your hair, wash your hair first as usual with your normal shampoo. 3. After you shampoo, rinse your hair and body thoroughly to remove the Shampoo. 4. Use CHG as you would any other liquid soap. You can apply chg directly to the skin and wash gently with scrungie or a clean washcloth. 5. Apply the CHG Soap to your body ONLY FROM THE NECK DOWN. Do not use on open wounds or open sores. Avoid contact with your eyes, ears, mouth and genitals (private parts). Wash genitals (private parts) with your normal soap. 6. Wash thoroughly, paying special attention to the area where your surgery will be performed. 7. Thoroughly rinse your body with warm water from the neck down. 8. DO NOT shower/wash with your normal soap after using and rinsing off the CHG Soap. 9. Pat yourself dry with a clean towel.  10. Wear clean pajamas.  11. Place clean sheets on your bed the night of your first shower and do not sleep with pets.  Day of Surgery  Do not apply any lotions/deodorants the morning of surgery. Please wear clean clothes to the hospital/surgery center.    Please read over the following fact sheets that you were given. Pain Booklet, Coughing and Deep Breathing and Surgical Site Infection Prevention

## 2014-11-02 NOTE — Progress Notes (Addendum)
Anesthesia PAT Evaluation: Patient is a 69 year old female posted for IR stent placement by Dr. Estanislado Pandy on 11/09/14. She presented with symptoms of vertebrobasilar insufficiency. She also has noticed up to a 30 point BP difference in her left and right arms (higher in RUE). 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 This was present in 2014 but may be increased. Neurologist Dr. Leonie Man referred her to IR for intervention.  I was asked to see patient at PAT due to reports of intermittent non-exertional SOB with bilateral shoulder tightness over the past 1 1/2 weeks. She walks at least 1 mile three times a week without chest pain or significant SOB; however, for the past 1 1/2 weeks she has developed intermittent episodes of SOB with tightness along both shoulder that last 5-10 minutes.  Symptoms have not occurred during her walks or within a close proximity of finishing her walks, rather they occur more randomly with inactivity or low impact activities such as cooking. Symptoms go away on there own. There is no associated arm or jaw pain, diaphoresis, palpitations, nausea, dizziness. She checks her BP and HR during these episodes. As above, her SBP in her LUE can run as low as 70-80's, but up to 100-120's in there RUE. These have been stable. HR has been in the 60-70's. Episodes don't seem to be associated with SVT, as she is typically aware of when she is in SVT and is able to abort her symptoms with PRN metoprolol within five minutes of taking. Episodes occur every few months, no recent. She has no known CAD history, but does have a strong family history in her father (died of massive MI 'age 81, brother with stent ~ age 92, sister "small MI" ~ age 57. No CAD history in her mother. No family history of DVT/PE or known clotting/bleeding disorder. She has felt more stressed due to upcoming procedure.  PAT Vitals: 36.8C, HR 73, RR 18, BP  126/6/1 and 124/55, 100% RA. On exam, she is a pleasant Caucasian female in NAD. No conversational dyspnea. Heart RRR, no murmur heard. Lungs clear. No ankle edema. 1+ left radial and 1-2+ brachial pulses. 2+ right radial and brachial pulses.   History includes former smoker, HLD, HTN, IBS, GERD, bleeding tendency (post-op bleeding; reported "negative" work-up with Dr. [name]), CVA '14, SVT, melanoma excision, anxiety, diverticulitis s/p sigmoidectomy requiring OR take back for LLQ hematoma s/p ligation of superficial vessel in the fascia '09, s/p Z5-6 ACDF '00 complicated by epidural hematoma with tetraparesis s/p evacuation (required PT/OT for RUE weakness) ~ '01, liver cyst, hysterectomy, cholecystectomy '01, appendectomy, back surgery, breast augmentation. BMI is 27.3. PCP is Dr. [name].   Cardiologist is Dr. Darlin Coco, last visit 02/12/13 for follow-up paroxysmal SVT treated with PRN metoprolol. 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  In regards to her history of post-operative bleeding, she had at least two procedures in which she required take back for hematoma.  She said she had significant bruising after her breast augmentation and developed a right flank hematoma 2 days following her cholecystectomy but did not require additional surgery. She reports a negative hematology work-up on at least two occasions, the last being in 2014. There is a note from hematologist Dr. [name] from 06/10/12. She had normal von Willebrand factor, normal factor XIII screen, normal PT, normal PTT, and a platelet count of 210,000, and fibrinogen at that time. He thought she  may have a qualitative platelet disorder, however, further testing 03/2013 revealed "platelet function is normal" (see telephone encounter 04/02/13 by Gretchen Short, RN/Dr. [name]). He also ordered screening tests for Sjogren's and RA which were WNL. Patient reports she has already spoken with Dr. Estanislado Pandy regarding her  post-operative bleeding history.  Meds include Xanax, Zyrtec, Plavix 75 mg 1/2 daily (decreased due to p2y12 of 9 on 10/13/14), Pepcid, estradiol, Flonase, Neurontin, Cozaar, Magnesium, Lopressor PRN, KCl, Crestor, Imitrex, trazodone. She is scheduled to start ASA 81 mg on 11/05/14.  06/29/12 Echo: Study Conclusions - Left ventricle: The cavity size was normal. Wall thickness was normal. Systolic function was vigorous. The estimated ejection fraction was in the range of 65% to 70%. Wall motion was normal; there were no regional wall motion abnormalities. There was an increased relative contribution of atrial contraction to ventricular filling. - Pulmonary arteries: Systolic pressure was moderately increased. PA peak pressure: 25mm Hg (S).  10/03/11 Nuclear stress test: Overall Impression: Normal stress nuclear study. There is no evidence of ischemia. Normal LV function. LV Ejection Fraction: 70%. LV Wall Motion: NL LV Function; NL Wall Motion.  11/02/14 EKG: NSR.  02/10/14 CXR: IMPRESSION: No acute cardiopulmonary findings.  Cerebral angiogram 10/15/14:  Findings. 1.LT SCA Steal With 70 % stenosis of LT SCA prox. 2.Approx 50 stenosis of LT ICA prox.  MRA of the head 09/25/14: IMPRESSION: Unremarkable MRA of the brain showing no significant stenosis of medium and large size intracranial vessels. 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 MRI of the brain 09/25/14: IMPRESSION: Mildly abnormal MRI scan of the brain showing mild age-related changes of chronic microvascular ischemia which appears to have progressed compared with previous MRI scan dated 08/06/2012.  Preoperative labs noted. PLT count, PT/PTT WNL. She is for p2y12 on the day of surgery.   I discussed patient's SOB/shoulder tightness episodes and post-operative bleeding history with anesthesiologist Dr. Orene Desanctis. It appears she had a negative hematology  work-up in 2014. Patient said she discussed with Dr. Estanislado Pandy, so will defer additional input to him for this procedure (he has already cut her Plavix in half). In regards to her SOB episodes, patient without exertional symptoms even after walking one mile. EKG is normal. Lung sounds are clear. Unless she develops progressive symptoms or exertional symptoms then would anticipate that she can proceed as planned from an anesthesia standpoint.   George Hugh [facility]/Anesthesiology Phone [phone] 11/02/2014 5:45 PM  Addendum: I updated IR physician assistant Pam regarding post-operative bleeding history and previous work-up studies done. I also notified her of the symptoms patient reported to me yesterday. Patient apparently called this morning to tell Dr. Estanislado Pandy about her symptoms. He is recommending that she follow-up with her PCP prior to her scheduled procedure.  George Hugh [facility]/Anesthesiology Phone [phone] 11/03/2014 12:12 PM

## 2014-11-02 NOTE — Progress Notes (Signed)
Patient states she has instructions regarding Plavix and Aspirin from Dr. Estanislado Pandy.

## 2014-11-02 NOTE — Progress Notes (Signed)
Paged Deveshwar's PA for orders

## 2014-11-02 NOTE — Progress Notes (Signed)
Patient admits to having episodes of nonexertional dyspnea with tightness in her shoulders over the last one and a half weeks.  Have spoken with Ebony Hail, [place]; will get EKG and she will evaluate patient.

## 2014-11-03 ENCOUNTER — Telehealth (HOSPITAL_COMMUNITY): Payer: Self-pay | Admitting: Interventional Radiology

## 2014-11-03 NOTE — Telephone Encounter (Signed)
Patient called on 11/02/14 with new complaints of episodes of SOB with pain in her shoulders that lasts anywhere from 3-10 minutes. These occur 1-2 times daily and started approximately 1 1/2 weeks ago. The patient states that she does not have a history of any heart problems. These episodes do not occur with exertion but do occur at rest.. She is scheduled to be treated by Dr. Estanislado Pandy on 11/09/14 with a left subclavian stent placement. I told her I would relay this information to Dr. Estanislado Pandy and call her back with his recommendation. I did speak with Deveshwar and he asked me to call the patient and tell her that he would like for her to see her primary care physician this week prior to her scheduled procedure. I called the patient and left her a voice mail with that exact request by Dr. Estanislado Pandy and asked her to call me back to confirm that she did in fact receive and understand his recommendation. JM

## 2014-11-05 ENCOUNTER — Other Ambulatory Visit: Payer: Self-pay | Admitting: Radiology

## 2014-11-05 LAB — PLATELET INHIBITION P2Y12: Platelet Function  P2Y12: 227 [PRU] (ref 194–418)

## 2014-11-06 ENCOUNTER — Telehealth (HOSPITAL_COMMUNITY): Payer: Self-pay | Admitting: *Deleted

## 2014-11-06 NOTE — Telephone Encounter (Signed)
instructed to increase plavix to 1 1/2 tablets per Dr. Estanislado Pandy to continue ASA . Asked pt to call back and let office know that she recieved VM

## 2014-11-06 NOTE — Telephone Encounter (Signed)
Return phone call received from pt.  Pt voiced understanding of instructions to take 1 1/2 tablet of Plavix and to continue ASA.

## 2014-11-09 ENCOUNTER — Encounter (HOSPITAL_COMMUNITY): Payer: Self-pay

## 2014-11-09 ENCOUNTER — Ambulatory Visit (HOSPITAL_COMMUNITY)
Admission: RE | Admit: 2014-11-09 | Discharge: 2014-11-09 | Disposition: A | Discharge status: Home / Self Care | Payer: Medicare Other | Source: Ambulatory Visit | Attending: Neurology | Admitting: Neurology

## 2014-11-09 ENCOUNTER — Ambulatory Visit (HOSPITAL_COMMUNITY): Payer: Medicare Other | Admitting: Vascular Surgery

## 2014-11-09 ENCOUNTER — Ambulatory Visit (HOSPITAL_COMMUNITY): Payer: Medicare Other | Admitting: Certified Registered Nurse Anesthetist

## 2014-11-09 ENCOUNTER — Inpatient Hospital Stay (HOSPITAL_COMMUNITY)
Admission: AD | Admit: 2014-11-09 | Discharge: 2014-11-10 | DRG: 254 | Disposition: A | Discharge status: Home / Self Care | Payer: Medicare Other | Source: Ambulatory Visit | Attending: Interventional Radiology | Admitting: Interventional Radiology

## 2014-11-09 ENCOUNTER — Encounter (HOSPITAL_COMMUNITY)
Admission: AD | Disposition: A | Discharge status: Home / Self Care | Payer: Self-pay | Source: Ambulatory Visit | Attending: Interventional Radiology

## 2014-11-09 ENCOUNTER — Encounter (HOSPITAL_COMMUNITY): Payer: Self-pay | Admitting: *Deleted

## 2014-11-09 DIAGNOSIS — I959 Hypotension, unspecified: Secondary | ICD-10-CM | POA: Diagnosis not present

## 2014-11-09 DIAGNOSIS — Z7902 Long term (current) use of antithrombotics/antiplatelets: Secondary | ICD-10-CM

## 2014-11-09 DIAGNOSIS — Z888 Allergy status to other drugs, medicaments and biological substances status: Secondary | ICD-10-CM | POA: Diagnosis not present

## 2014-11-09 DIAGNOSIS — K219 Gastro-esophageal reflux disease without esophagitis: Secondary | ICD-10-CM | POA: Diagnosis present

## 2014-11-09 DIAGNOSIS — Z885 Allergy status to narcotic agent status: Secondary | ICD-10-CM | POA: Diagnosis not present

## 2014-11-09 DIAGNOSIS — I10 Essential (primary) hypertension: Secondary | ICD-10-CM | POA: Diagnosis present

## 2014-11-09 DIAGNOSIS — F419 Anxiety disorder, unspecified: Secondary | ICD-10-CM | POA: Diagnosis present

## 2014-11-09 DIAGNOSIS — I708 Atherosclerosis of other arteries: Secondary | ICD-10-CM | POA: Diagnosis present

## 2014-11-09 DIAGNOSIS — Z8249 Family history of ischemic heart disease and other diseases of the circulatory system: Secondary | ICD-10-CM

## 2014-11-09 DIAGNOSIS — Z87891 Personal history of nicotine dependence: Secondary | ICD-10-CM

## 2014-11-09 DIAGNOSIS — Z9049 Acquired absence of other specified parts of digestive tract: Secondary | ICD-10-CM | POA: Diagnosis present

## 2014-11-09 DIAGNOSIS — K589 Irritable bowel syndrome without diarrhea: Secondary | ICD-10-CM | POA: Diagnosis present

## 2014-11-09 DIAGNOSIS — Z8673 Personal history of transient ischemic attack (TIA), and cerebral infarction without residual deficits: Secondary | ICD-10-CM | POA: Diagnosis not present

## 2014-11-09 DIAGNOSIS — I771 Stricture of artery: Secondary | ICD-10-CM

## 2014-11-09 DIAGNOSIS — E785 Hyperlipidemia, unspecified: Secondary | ICD-10-CM | POA: Diagnosis present

## 2014-11-09 HISTORY — PX: RADIOLOGY WITH ANESTHESIA: SHX6223

## 2014-11-09 LAB — POCT ACTIVATED CLOTTING TIME
Activated Clotting Time: 214 seconds
Activated Clotting Time: 183 seconds

## 2014-11-09 LAB — PLATELET INHIBITION P2Y12: Platelet Function  P2Y12: 136 [PRU] — ABNORMAL LOW (ref 194–418)

## 2014-11-09 LAB — HEPARIN LEVEL (UNFRACTIONATED): Heparin Unfractionated: 0.15 IU/mL — ABNORMAL LOW (ref 0.30–0.70)

## 2014-11-09 SURGERY — RADIOLOGY WITH ANESTHESIA
Anesthesia: General

## 2014-11-09 MED ORDER — MEPERIDINE HCL 25 MG/ML IJ SOLN: 6.2500 mg | INTRAMUSCULAR | Status: DC | PRN

## 2014-11-09 MED ORDER — ACETAMINOPHEN 650 MG RE SUPP: 650.0000 mg | Freq: Four times a day (QID) | RECTAL | Status: DC | PRN

## 2014-11-09 MED ORDER — LABETALOL HCL 5 MG/ML IV SOLN
INTRAVENOUS | Status: DC | PRN
  Administered 2014-11-09: 2.5 mg via INTRAVENOUS
  Administered 2014-11-09: 5 mg via INTRAVENOUS
  Administered 2014-11-09 (×3): 2.5 mg via INTRAVENOUS

## 2014-11-09 MED ORDER — NITROGLYCERIN 1 MG/10 ML FOR IR/CATH LAB
INTRA_ARTERIAL | Status: AC
  Filled 2014-11-09: qty 10

## 2014-11-09 MED ORDER — SODIUM CHLORIDE 0.9 % IV SOLN: INTRAVENOUS | Status: DC

## 2014-11-09 MED ORDER — HEPARIN (PORCINE) IN NACL 100-0.45 UNIT/ML-% IJ SOLN
700.0000 [IU]/h | INTRAMUSCULAR | Status: DC
  Administered 2014-11-09: 700 [IU]/h via INTRAVENOUS
  Filled 2014-11-09 (×2): qty 250

## 2014-11-09 MED ORDER — ONDANSETRON HCL 4 MG/2ML IJ SOLN: 4.0000 mg | Freq: Four times a day (QID) | INTRAMUSCULAR | Status: DC | PRN

## 2014-11-09 MED ORDER — HEPARIN SODIUM (PORCINE) 1000 UNIT/ML IJ SOLN
INTRAMUSCULAR | Status: DC | PRN
  Administered 2014-11-09: 3000 [IU] via INTRAVENOUS

## 2014-11-09 MED ORDER — NICARDIPINE HCL IN NACL 40-0.83 MG/200ML-% IV SOLN
3.0000 mg/h | INTRAVENOUS | Status: DC
  Filled 2014-11-09: qty 200

## 2014-11-09 MED ORDER — IOHEXOL 300 MG/ML  SOLN
250.0000 mL | Freq: Once | INTRAMUSCULAR | Status: AC | PRN
  Administered 2014-11-09: 100 mL via INTRAVENOUS

## 2014-11-09 MED ORDER — FENTANYL CITRATE (PF) 100 MCG/2ML IJ SOLN
INTRAMUSCULAR | Status: DC | PRN
  Administered 2014-11-09: 50 ug via INTRAVENOUS

## 2014-11-09 MED ORDER — HYDROMORPHONE HCL 1 MG/ML IJ SOLN: 0.2500 mg | INTRAMUSCULAR | Status: DC | PRN

## 2014-11-09 MED ORDER — PROPOFOL 10 MG/ML IV BOLUS
INTRAVENOUS | Status: AC
  Filled 2014-11-09: qty 20

## 2014-11-09 MED ORDER — MIDAZOLAM HCL 5 MG/5ML IJ SOLN
INTRAMUSCULAR | Status: DC | PRN
  Administered 2014-11-09: 1 mg via INTRAVENOUS

## 2014-11-09 MED ORDER — SODIUM CHLORIDE 0.9 % IV SOLN
INTRAVENOUS | Status: DC
  Administered 2014-11-09 (×2): via INTRAVENOUS

## 2014-11-09 MED ORDER — CEFAZOLIN SODIUM-DEXTROSE 2-3 GM-% IV SOLR
2.0000 g | Freq: Once | INTRAVENOUS | Status: AC
  Administered 2014-11-09: 2 g via INTRAVENOUS
  Filled 2014-11-09: qty 50

## 2014-11-09 MED ORDER — LIDOCAINE HCL (CARDIAC) 20 MG/ML IV SOLN
INTRAVENOUS | Status: AC
  Filled 2014-11-09: qty 5

## 2014-11-09 MED ORDER — LACTATED RINGERS IV SOLN
INTRAVENOUS | Status: DC | PRN
Start: 2014-11-09 — End: 2014-11-09
  Administered 2014-11-09: 07:00:00 via INTRAVENOUS

## 2014-11-09 MED ORDER — NICARDIPINE HCL IN NACL 20-0.86 MG/200ML-% IV SOLN: 5.0000 mg/h | INTRAVENOUS | Status: DC

## 2014-11-09 MED ORDER — LIDOCAINE HCL 1 % IJ SOLN
INTRAMUSCULAR | Status: AC
  Filled 2014-11-09: qty 20

## 2014-11-09 MED ORDER — CLOPIDOGREL BISULFATE 75 MG PO TABS
75.0000 mg | ORAL_TABLET | Freq: Every day | ORAL | Status: DC
  Administered 2014-11-10: 75 mg via ORAL
  Filled 2014-11-09 (×2): qty 1

## 2014-11-09 MED ORDER — ASPIRIN 325 MG PO TABS
325.0000 mg | ORAL_TABLET | Freq: Every day | ORAL | Status: DC
  Administered 2014-11-10: 325 mg via ORAL
  Filled 2014-11-09 (×2): qty 1

## 2014-11-09 MED ORDER — ONDANSETRON HCL 4 MG/2ML IJ SOLN: 4.0000 mg | Freq: Once | INTRAMUSCULAR | Status: AC | PRN

## 2014-11-09 MED ORDER — NICARDIPINE HCL IN NACL 20-0.86 MG/200ML-% IV SOLN
INTRAVENOUS | Status: DC | PRN
Start: 2014-11-09 — End: 2014-11-09
  Administered 2014-11-09: 2.5 mg/h via INTRAVENOUS

## 2014-11-09 MED ORDER — ACETAMINOPHEN 500 MG PO TABS: 1000.0000 mg | ORAL_TABLET | Freq: Four times a day (QID) | ORAL | Status: DC | PRN

## 2014-11-09 MED ORDER — SODIUM CHLORIDE 0.9 % IV BOLUS (SEPSIS)
250.0000 mL | Freq: Once | INTRAVENOUS | Status: AC
Start: 2014-11-09 — End: 2014-11-09
  Administered 2014-11-09: 250 mL via INTRAVENOUS

## 2014-11-09 NOTE — Procedures (Signed)
Lt common carotid arteriogram,and lt subclavian arteriogram,  Followed  by stent assisted  Of severe Lt subclavian stenosis proximally.

## 2014-11-09 NOTE — Anesthesia Preprocedure Evaluation (Addendum)
Anesthesia Evaluation  Patient identified by MRN, date of birth, ID band Patient awake    Reviewed: Allergy & Precautions, NPO status , Patient's Chart, lab work & pertinent test results  Airway Mallampati: II  TM Distance: >3 FB Neck ROM: Full    Dental  (+) Teeth Intact, Dental Advisory Given   Pulmonary former smoker,    Pulmonary exam normal       Cardiovascular hypertension, Pt. on medications Normal cardiovascular exam L Subclavian Stenosis   Neuro/Psych Anxiety    GI/Hepatic GERD-  Medicated and Controlled,  Endo/Other    Renal/GU      Musculoskeletal   Abdominal   Peds  Hematology   Anesthesia Other Findings   Reproductive/Obstetrics                           Anesthesia Physical Anesthesia Plan  ASA: III  Anesthesia Plan: General   Post-op Pain Management:    Induction: Intravenous  Airway Management Planned: Oral ETT  Additional Equipment: Arterial line  Intra-op Plan:   Post-operative Plan: Extubation in OR  Informed Consent: I have reviewed the patients History and Physical, chart, labs and discussed the procedure including the risks, benefits and alternatives for the proposed anesthesia with the patient or authorized representative who has indicated his/her understanding and acceptance.     Plan Discussed with: CRNA and Surgeon  Anesthesia Plan Comments:        Anesthesia Quick Evaluation

## 2014-11-09 NOTE — H&P (Signed)
Chief Complaint: Left subclavian stenosis  Referring Physician(s): Sethi,Pramod S  History of Present Illness: Matalynn Graff Matherne is a 69 y.o. female with symptomatic left subclavian stenosis. She is scheduled for angioplasty today. She had recently had some SOB issues but was seen by her PCP and by Anesthesia pre-op. No acute cardiopulmonary issues identified. Pt was previously taking Xanax regularly but had stopped for a few weeks. She, as well as her PCP, feel she was having some mild anxiety attacks. She has since taken her Xanax again and she no longer has SOB episodes. She is feeling well, no other recent issues, fevers, chills, illness. Denies, CP, SOB, abd pain, dysuria, diarrhea. PMHx, meds, labs reviewed. Has been NPO this am  Past Medical History  Diagnosis Date  . Colon polyp   . Headache(784.0)   . Hyperlipidemia   . Arthritis   . Family history of cardiovascular disease   . HTN (hypertension)   . IBS (irritable bowel syndrome)     followed by Dr. [name]  . Hypertension   . IBS (irritable bowel syndrome)   . GERD (gastroesophageal reflux disease)   . Bleeding tendency 05/16/2012    Hx post op bleeding (epidural hematoma s/p ACDF '10; LLQ hematoma due to superficial vessel fascial bleed post colon resection; Normal platelet count; saw Dr. [name] '14  . Stroke   . Melanoma     removed from back 04/2013  . Dysrhythmia     SVT  . SVT (supraventricular tachycardia)   . Shortness of breath dyspnea     nonexertional dyspnea. random.   Marland Kitchen Anxiety   . Urinary urgency   . Liver cyst   . Diverticulitis   . Fracture of right fibula     Past Surgical History  Procedure Laterality Date  . Spine surgery      cervical fusion  . Colon surgery    . Abdominal hysterectomy    . Tonsillectomy    . Cholecystectomy    . Appendectomy    . Skin cancer excised  04/2013  . Back surgery    . Tonsillectomy    . Breast surgery      BIL breast augmentation  . Vagina surgery       anterior posterior repair  . Sigmoidectomy      SIGMOID COLECTOMY  . Joint replacement Left     thumb  . Eye surgery      eyelid drooping fixed  . Tubal ligation      Allergies: Daypro; Lisinopril; Other; Oxaprozin; and Simvastatin  Medications: Prior to Admission medications   Medication Sig Start Date End Date Taking? Authorizing Provider  acetaminophen (TYLENOL) 650 MG CR tablet Take 1,300 mg by mouth every 8 (eight) hours as needed for pain.     Historical Provider, MD  ALPRAZolam (XANAX) 0.5 MG tablet TAKE 1 TABLET BY MOUTH 3 TIMES A DAY AS NEEDED FOR SLEEP    Janith Lima, MD  Calcium Carbonate (CALCIUM 500 PO) Take 1 tablet by mouth 2 (two) times daily.    Historical Provider, MD  cetirizine (ZYRTEC) 10 MG tablet Take 10 mg by mouth every morning.     Historical Provider, MD  Cholecalciferol (VITAMIN D3) 2000 UNITS TABS Take 1 tablet by mouth every morning.     Historical Provider, MD  clopidogrel (PLAVIX) 75 MG tablet Take 75 mg by mouth daily.    Historical Provider, MD  cycloSPORINE (RESTASIS) 0.05 % ophthalmic emulsion Place 1 drop into both eyes 2 (two)  times daily.    Historical Provider, MD  estradiol (VIVELLE-DOT) 0.0375 MG/24HR Place 1 patch onto the skin as needed (Uses for hot flashes).     Historical Provider, MD  famotidine (PEPCID) 40 MG tablet Take 1 tablet (40 mg total) by mouth at bedtime as needed for heartburn. 09/15/13   Janith Lima, MD  fluticasone (FLONASE) 50 MCG/ACT nasal spray Place 1 spray into both nostrils daily.    Historical Provider, MD  gabapentin (NEURONTIN) 300 MG capsule Take 300 mg by mouth 3 (three) times daily.    Historical Provider, MD  Loperamide HCl (IMODIUM PO) Take 1 tablet by mouth daily.     Historical Provider, MD  losartan (COZAAR) 100 MG tablet Take 0.5 tablets (50 mg total) by mouth 2 (two) times daily. 09/16/13   Janith Lima, MD  Magnesium 250 MG TABS Take 1 tablet by mouth daily.    Historical Provider, MD  metoprolol  (LOPRESSOR) 50 MG tablet Take 1 tablet (50 mg total) by mouth as needed. 09/15/13   Janith Lima, MD  potassium chloride (KLOR-CON 10) 10 MEQ tablet Take 1 tablet (10 mEq total) by mouth 2 (two) times daily. 09/15/13   Janith Lima, MD  rosuvastatin (CRESTOR) 20 MG tablet Take 1 tablet (20 mg total) by mouth daily. 09/16/13   Janith Lima, MD  SUMAtriptan (IMITREX) 50 MG tablet Take 50 mg by mouth every 2 (two) hours as needed for migraine. May repeat in 2 hours if headache persists or recurs.    Historical Provider, MD  traZODone (DESYREL) 100 MG tablet Take 2 tablets (200 mg total) by mouth at bedtime. Patient taking differently: Take 100 mg by mouth at bedtime.  09/16/13   Janith Lima, MD     Family History  Problem Relation Age of Onset  . Hyperlipidemia Other   . Hypertension Other   . Cancer Other     lung, esophagus, stomach  . Stroke Other   . Heart disease Father   . Heart disease Sister   . Heart attack Sister   . Heart disease Brother   . Heart attack Brother   . Stroke Son     History   Social History  . Marital Status: Single    Spouse Name: N/A  . Number of Children: 2  . Years of Education: 14   Occupational History  . 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 Smoking status: Former Smoker -- 2.00 packs/day for 22 years    Types: Cigarettes    Quit date: 04/23/1985  . Smokeless tobacco: Never Used  . Alcohol Use: 2.4 oz/week    4 Glasses of wine per week  . Drug Use: No  . Sexual Activity: Yes    Birth Control/ Protection: Surgical   Other Topics Concern  . Not on file   Social History Narrative   ** Merged History Encounter **       Domestic partner   Regular exercise-no   Right handed   Caffeine use-- 2 cups coffee daily, rare soda     Review of Systems: A 12 point ROS discussed and pertinent positives are indicated in the HPI above.  All other systems are negative.  Review of Systems  Vital Signs: Temp:  97.4, BP: 128/45, HR: 65, RR: 18, O2: 100% room air  Physical Exam  Constitutional: She is oriented to person, place, and time. She appears well-developed  and well-nourished. No distress.  HENT:  Mouth/Throat: Oropharynx is clear and moist.  Neck: Normal range of motion. No JVD present. No tracheal deviation present.  Cardiovascular: Normal rate, regular rhythm, normal heart sounds and intact distal pulses.   Pulmonary/Chest: Effort normal and breath sounds normal. No respiratory distress.  Abdominal: Soft.  Neurological: She is alert and oriented to person, place, and time. No cranial nerve deficit. Coordination normal.  Psychiatric: She has a normal mood and affect. Judgment normal.    Imaging:  Ir Angio Intra Extracran Sel Com Carotid Innominate Bilat Mod Sed  10/15/2014   CLINICAL DATA:  Vertebrobasilar insufficiency. Abnormal CT angiogram of the left subclavian artery.  EXAM: IR ANGIO VERTEBRAL SEL SUBCLAVIAN INNOMINATE UNI LEFT MOD SED. BILATERAL COMMON CAROTID ARTERIOGRAMS, RIGHT VERTEBRAL ARTERY ANGIOGRAM AND LEFT SUBCLAVIAN ARTERIOGRAM.  ANESTHESIA/SEDATION: Conscious sedation.  MEDICATIONS: Versed 1 mg IV.  Fentanyl 25 mcg IV.  CONTRAST:  60 mL OMNIPAQUE IOHEXOL 300 MG/ML  SOLN  PROCEDURE: Following a full explanation of the procedure along with the potential associated complications, an informed witnessed consent was obtained.  The right groin was prepped and draped in the usual sterile fashion. Thereafter using modified Seldinger technique, transfemoral access into the right common femoral artery was obtained without difficulty. Over a 0.035 inch guidewire, a 5 French Pinnacle sheath was inserted. Through this and also over a 0.035 inch guidewire, a 5 French JB1 catheter was advanced to the aortic arch region and selectively positioned in the right common carotid artery, the right vertebral artery, the left common carotid artery and the left subclavian artery. The patient tolerated the  procedure well.  COMPLICATIONS: None immediate.  FINDINGS: The right vertebral artery origin is normal.  The vessel is seen to opacify normally to the cranial skull base. Normal opacification is seen of the right posterior-inferior cerebellar artery and the right vertebrobasilar junction. The basilar artery, the left posterior cerebral artery, the superior cerebellar arteries, and the anterior inferior cerebellar arteries opacify normally into the capillary and venous phases. Nonvisualization of the right posterior cerebral artery is probably related to inflow from the anterior circulation.  Almost retrograde opacification of the left vertebrobasilar junction to the C2 region is seen from the right vertebral artery injection consistent with left subclavian steal phenomenon.  The right common carotid arteriogram demonstrates the right external carotid artery to be mildly narrowed at its origin.  Its branches are normally opacified.  The right internal carotid artery at the bulb demonstrates minimal caliber irregularity associated with a small ulceration. There is a less than 10% stenosis by the NASCET criteria.  The vessel is seen to opacify normally to the cranial skull base. The petrous, the cavernous and the supraclinoid segments are widely patent.  A right posterior communicating artery is seen opacifying the right posterior cerebral artery and the right superior cerebellar artery distributions.  The right middle cerebral artery demonstrates wide patency as does the right anterior cerebral artery. Both these opacify normally into the capillary and venous phases. Cross filling via the anterior communicating artery of the left anterior cerebral artery A2 segment is seen.  The left common carotid arteriogram demonstrates significant narrowing of the proximal right external carotid artery at its origin.  More distally the right external carotid artery branches appear to be normally opacified.  The right internal  carotid artery at the bulb has approximately 50% narrowing by the NASCET criteria without acute ulcerations. Circumferential calcified plaque is seen.  More distally the left internal carotid artery is  seen to opacify normally to the cranial skull base.  The petrous, the cavernous and the supraclinoid segments are widely patent.  A left posterior communicating artery is seen opacifying the left posterior cerebral artery distribution.  The left middle and the left anterior cerebral arteries opacify normally into the capillary and venous phases. There is opacification of the sphenoparietal sinuses into the ipsilateral cavernous sinus and then via the inferior petrosal sinuses into the jugular bulb regions bilaterally.  The left subclavian arteriogram demonstrates approximately 70% stenoses associated with filling defect which probably represents a combination of calcified plaque and also thrombus.  However, antegrade flow is seen into the left subclavian artery with slow ascent of flow into the left vertebral artery.  IMPRESSION: Approximately 70% narrowing of the left subclavian artery proximally associated with radiolucency. This may represent a combination of calcification and/or of thrombus.  Slow antegrade flow is seen within the left vertebral artery.  A left subclavian steal from the right vertebral artery into the left vertebral artery as described above.  Approximately 50% narrowing of the left internal carotid artery at the bulb.  Tight narrowing of the left external carotid artery proximally.  The angiographic findings were reviewed with the patient and the patient's friend. Given the above angiographic findings with the patient's clinical history, the option of endovascular revascularization of the left subclavian artery proximally was reviewed with the patient. The procedure, the potential complications and the alternatives were discussed in detail. She would like to proceed with the procedure which will  be scheduled at the earliest possible.  Additionally she will be started on aspirin 81 mg per day 5 days prior to the procedure. She will continue taking her Plavix also.   Electronically Signed   By: Luanne Bras M.D.   On: 10/15/2014 10:40    Labs:  CBC:  Recent Labs  10/15/14 0731 11/02/14 1348  WBC 3.7* 4.9  HGB 11.4* 12.1  HCT 34.3* 36.5  PLT 210 233    COAGS:  Recent Labs  10/15/14 0731 11/02/14 1348  INR 0.99 0.97  APTT 29 29    BMP:  Recent Labs  10/15/14 0731 11/02/14 1348  NA 137 141  K 4.3 4.7  CL 101 105  CO2 29 29  GLUCOSE 107* 109*  BUN 10 11  CALCIUM 9.7 10.4*  CREATININE 1.00 1.02*  GFRNONAA 56* 54*  GFRAA >60 >60   Assessment and Plan:  Left subclavian stenosis  Plan to proceed with PTA/stent angioplasty today under general anesthesia. Procedure, risks, complications, plan for overnight observation all discussed with pt and family. Labs reviewed. Pt has been taking Plavix, ASA as directed. Consent signed in chart   Signed: Ascencion Dike 11/09/2014, 8:04 AM

## 2014-11-09 NOTE — Sedation Documentation (Signed)
Transported to 39M04, report given to Marlowe Kays, Therapist, sports.  Groin and pulses reviewed.  Right groin soft with no signs of hematoma 3+ R pedal pulse.  Left radial pulse 2+.  Hematoma post surgeries reported to 39M RN,  Post angiogram instructions reviewed as well as Left arm restrictions reviewed with pt and RN  No left arm movement above head.

## 2014-11-09 NOTE — Anesthesia Postprocedure Evaluation (Signed)
Anesthesia Post Note  Patient: Susan Gillespie  Procedure(s) Performed: Procedure(s) (LRB): STENT PLACEMENT (N/A)  Anesthesia type: general  Patient location: PACU  Post pain: Pain level controlled  Post assessment: Patient's Cardiovascular Status Stable  Last Vitals:  Filed Vitals:   11/09/14 1230  BP: 94/41  Pulse: 57  Temp:   Resp: 15    Post vital signs: Reviewed and stable  Level of consciousness: sedated  Complications: No apparent anesthesia complications

## 2014-11-09 NOTE — Progress Notes (Signed)
Called Dr. Estanislado Pandy regarding patient's low bp (96/39 cuff; 105/35 art line).  Blood pressure appeared to drop when patient sleeping.  According to patient, her baseline systiolic blood pressure is around 110s but drops when sleeping. Reiterated to Dr. Estanislado Pandy that the Cardene was off and had been since 1200.  Was told to put in order for 250 ml bolus and wake patient when pressure drops.   Vader, Monroe C 11/09/2014 2:42 PM

## 2014-11-09 NOTE — Transfer of Care (Signed)
Immediate Anesthesia Transfer of Care Note  Patient: Susan Gillespie  Procedure(s) Performed: Procedure(s): STENT PLACEMENT (N/A)  Patient Location: ICU  Anesthesia Type:MAC  Level of Consciousness: awake, alert , oriented, patient cooperative and responds to stimulation  Airway & Oxygen Therapy: Patient Spontanous Breathing  Post-op Assessment: Report given to RN, Post -op Vital signs reviewed and stable and Patient moving all extremities X 4  Post vital signs: Reviewed and stable  Last Vitals:  Filed Vitals:   11/09/14 0646  BP: 128/45  Pulse: 65  Temp: 36.3 C  Resp: 18    Complications: No apparent anesthesia complications

## 2014-11-09 NOTE — Sedation Documentation (Signed)
Pre procedure check list completed.

## 2014-11-09 NOTE — Sedation Documentation (Signed)
Moved to stretcher for tx to 58M, groin and pulse stable

## 2014-11-09 NOTE — Sedation Documentation (Signed)
7 [place] Exoseal closure by [employer], RT. Pressure held post.

## 2014-11-09 NOTE — Progress Notes (Addendum)
ANTICOAGULATION CONSULT NOTE - Initial Consult  Pharmacy Consult for Heparin  Indication: Post Interventional Neuroradiological Procedure  Allergies  Allergen Reactions  . Daypro [Oxaprozin] Swelling    facial  . Lisinopril     cough  . Other Itching    narcotics  . Oxaprozin     Unknown.    . Simvastatin     REACTION: myalgias and fatigue    Patient Measurements: Height: 5\' 3"  (160 cm) Weight: 158 lb 4.6 oz (71.8 kg) IBW/kg (Calculated) : 52.4 Heparin Dosing Weight: 67.4  Vital Signs: Temp: 98 F (36.7 C) (07/25 1200) Temp Source: Oral (07/25 1200) BP: 94/41 mmHg (07/25 1230) Pulse Rate: 57 (07/25 1230)  Labs: No results for input(s): HGB, HCT, PLT, APTT, LABPROT, INR, HEPARINUNFRC, CREATININE, CKTOTAL, CKMB, TROPONINI in the last 72 hours.  Estimated Creatinine Clearance: 48.8 mL/min (by C-G formula based on Cr of 1.02).   Medical History: Past Medical History  Diagnosis Date  . Colon polyp   . Headache(784.0)   . Hyperlipidemia   . Arthritis   . Family history of cardiovascular disease   . HTN (hypertension)   . IBS (irritable bowel syndrome)     followed by Dr. [name]  . Hypertension   . IBS (irritable bowel syndrome)   . GERD (gastroesophageal reflux disease)   . Bleeding tendency 05/16/2012    Hx post op bleeding (epidural hematoma s/p ACDF '10; LLQ hematoma due to superficial vessel fascial bleed post colon resection; Normal platelet count; saw Dr. [name] '14  . Stroke   . Melanoma     removed from back 04/2013  . Dysrhythmia     SVT  . SVT (supraventricular tachycardia)   . Shortness of breath dyspnea     nonexertional dyspnea. random.   Marland Kitchen Anxiety   . Urinary urgency   . Liver cyst   . Diverticulitis   . Fracture of right fibula     Medications:  Scheduled:  . [START ON 11/10/2014] aspirin  325 mg Oral Q breakfast  . [START ON 11/10/2014] clopidogrel  75 mg Oral Q breakfast   Infusions:  . sodium chloride 75 mL/hr at 11/09/14 1215   . heparin 700 Units/hr (11/09/14 1244)  . niCARDipine Stopped (11/09/14 1200)   PRN: acetaminophen **OR** acetaminophen, HYDROmorphone (DILAUDID) injection, meperidine (DEMEROL) injection, ondansetron (ZOFRAN) IV, ondansetron (ZOFRAN) IV  Assessment: 33 YOF presenting with symptomatic left subclavian stenosis and stent angioplasty.  Pharmacy consulted to dose heparin s/p neuroradiological procedure. 7/18 Plts 233, Hgb 12.1. No s/sx of bleeding noted.     Goal of Therapy:  Heparin level 0.1 - 0.25 units/ml Monitor platelets by anticoagulation protocol: Yes   Plan:  Start heparin infusion at 700 units/hr  8 hr HL Daily CBC/HL Monitor for s/sx of bleeding.   Kem Parkinson, PharmD Pharmacy Resident  11/09/2014,1:37 PM  ADDN: F/u HL is therapeutic at 0.15 on heparin 700 units/hr. No issues with infusion or bleeding noted. Continue current rate and check in the morning.  Andrey Cota. Diona Foley, PharmD Clinical Pharmacist Pager [phone]

## 2014-11-10 ENCOUNTER — Other Ambulatory Visit: Payer: Medicare Other | Admitting: Radiology

## 2014-11-10 ENCOUNTER — Encounter (HOSPITAL_COMMUNITY): Payer: Self-pay | Admitting: Interventional Radiology

## 2014-11-10 DIAGNOSIS — I771 Stricture of artery: Secondary | ICD-10-CM

## 2014-11-10 LAB — CBC WITH DIFFERENTIAL/PLATELET
BASOS ABS: 0 10*3/uL (ref 0.0–0.1)
Basophils Relative: 0 % (ref 0–1)
Eosinophils Absolute: 0.1 10*3/uL (ref 0.0–0.7)
Eosinophils Relative: 2 % (ref 0–5)
HEMATOCRIT: 30.5 % — AB (ref 36.0–46.0)
Hemoglobin: 10.3 g/dL — ABNORMAL LOW (ref 12.0–15.0)
LYMPHS PCT: 19 % (ref 12–46)
Lymphs Abs: 1.1 10*3/uL (ref 0.7–4.0)
MCH: 33 pg (ref 26.0–34.0)
MCHC: 33.8 g/dL (ref 30.0–36.0)
MCV: 97.8 fL (ref 78.0–100.0)
MONO ABS: 0.5 10*3/uL (ref 0.1–1.0)
MONOS PCT: 8 % (ref 3–12)
NEUTROS ABS: 4 10*3/uL (ref 1.7–7.7)
Neutrophils Relative %: 71 % (ref 43–77)
PLATELETS: 172 10*3/uL (ref 150–400)
RBC: 3.12 MIL/uL — ABNORMAL LOW (ref 3.87–5.11)
RDW: 13.1 % (ref 11.5–15.5)
WBC: 5.7 10*3/uL (ref 4.0–10.5)

## 2014-11-10 LAB — BASIC METABOLIC PANEL
ANION GAP: 6 (ref 5–15)
BUN: 16 mg/dL (ref 6–20)
CO2: 24 mmol/L (ref 22–32)
Calcium: 8.7 mg/dL — ABNORMAL LOW (ref 8.9–10.3)
Chloride: 107 mmol/L (ref 101–111)
Creatinine, Ser: 0.95 mg/dL (ref 0.44–1.00)
GFR calc non Af Amer: 59 mL/min — ABNORMAL LOW (ref >60)
Glucose, Bld: 113 mg/dL — ABNORMAL HIGH (ref 65–99)
POTASSIUM: 4.3 mmol/L (ref 3.5–5.1)
Sodium: 137 mmol/L (ref 135–145)

## 2014-11-10 LAB — HEPARIN LEVEL (UNFRACTIONATED)

## 2014-11-10 MED ORDER — ASPIRIN 325 MG PO TABS: 325.0000 mg | ORAL_TABLET | Freq: Every day | ORAL | Status: DC

## 2014-11-10 NOTE — Significant Event (Signed)
All discharge instructions discussed with patient.  IV removed no bleeding noted at site, right groin remains at level Zero after walking in hall.   VS stable this AM, patient and significant other agreeable to discharge, no questions or concerns.

## 2014-11-10 NOTE — Discharge Summary (Signed)
Patient ID: Susan Gillespie MRN: [id number] DOB/AGE: May 26, 1945 69 y.o.  Admit date: 11/09/2014 Discharge date: 11/10/2014  Admission Diagnoses: Left subclavian artery stenosis   Discharge Diagnoses:  Active Problems:   Subclavian artery stenosis, left S/p left common carotid arteriogram, left subclavian arteriogram s/p stent assisted angioplasty of severe left subclavian stenosis.   Discharged Condition: Good, stable.   Hospital Course: The patient is a 69 year old female with c/o a rumbling pulsatile noise just above her right ear that may last from 5 minutes to 5 hours and are usually associated with a mild dizziness. She presented in consult on 10/13/14 and underwent an arteriogram on 10/15/14 that revealed 70% left subclavian artery proximally. She was deemed a candidate for left subclavian artery stent assisted angioplasty and presented as an outpatient on 11/09/14 to undergo the procedure.   She underwent successful left common carotid arteriogram, left subclavian arteriogram s/p stent assisted angioplasty of severe left subclavian stenosis and was extubated without difficulty. She was admitted overnight for observation with stable vitals and labs. She has had her foley catheter removed and voided without difficulty. She has tolerated a regular diet without nausea or vomiting. She has ambulated without lightheadedness or dizziness. She denies any speech changes, vision changes, extremity weakness or headache. She denies any right groin access site pain, swelling or bleeding. She is ready for discharge home and has been seen and evaluated by Dr. Estanislado Pandy and deemed medically stable to do so. She was given the below instructions and a Rx for Plavix and will follow-up with [place] in 2 weeks. She verbalizes understanding of the above plan.   Consults: Anesthesia.   Treatments: S/p left common carotid arteriogram, left subclavian arteriogram s/p stent assisted angioplasty of severe left  subclavian stenosis.  Discharge Exam: Blood pressure 153/58, pulse 65, temperature 97.7 F (36.5 C), temperature source Oral, resp. rate 11, height 5\' 3"  (1.6 m), weight 162 lb 0.6 oz (73.5 kg), SpO2 100 %.   PE:  General: A&Ox3, NAD, sitting up in chair eating Heart: RRR without M/G/R Lungs: CTA b/l Abd: Soft, NT, ND Ext: intact radial pulses b/l, RCFA access dressing C/D/I, soft, no signs of infection, hematoma or bleeding, DP intact b/l Neuro: Grossly intact, speech clear, smile symmetrical, upper and lower ext equal strength 5/5, fine motor intact, no ataxia or pronator drift. EOMI  Disposition: 01-Home or Self Care  Discharge Instructions    Call MD for:  difficulty breathing, headache or visual disturbances    Complete by:  As directed      Call MD for:  extreme fatigue    Complete by:  As directed      Call MD for:  hives    Complete by:  As directed      Call MD for:  persistant dizziness or light-headedness    Complete by:  As directed      Call MD for:  persistant nausea and vomiting    Complete by:  As directed      Call MD for:  redness, tenderness, or signs of infection (pain, swelling, redness, odor or green/yellow discharge around incision site)    Complete by:  As directed      Call MD for:  severe uncontrolled pain    Complete by:  As directed      Call MD for:  temperature >100.4    Complete by:  As directed      Diet - low sodium heart healthy    Complete by:  As directed      Driving Restrictions    Complete by:  As directed   No driving x 2 weeks     Increase activity slowly    Complete by:  As directed      Lifting restrictions    Complete by:  As directed   No lifting over 20 lbs x 2 weeks, no lifting weight over head on left arm x 2 weeks     Remove dressing in 24 hours    Complete by:  As directed             Medication List    TAKE these medications        acetaminophen 650 MG CR tablet  Commonly known as:  TYLENOL  Take 1,300 mg by  mouth every 8 (eight) hours as needed for pain.     ALPRAZolam 0.5 MG tablet  Commonly known as:  XANAX  TAKE 1 TABLET BY MOUTH 3 TIMES A DAY AS NEEDED FOR SLEEP     aspirin 325 MG tablet  Take 1 tablet (325 mg total) by mouth daily.     CALCIUM 500 PO  Take 1 tablet by mouth 2 (two) times daily.     cetirizine 10 MG tablet  Commonly known as:  ZYRTEC  Take 10 mg by mouth every morning.     clopidogrel 75 MG tablet  Commonly known as:  PLAVIX  Take 75 mg by mouth daily.     cycloSPORINE 0.05 % ophthalmic emulsion  Commonly known as:  RESTASIS  Place 1 drop into both eyes 2 (two) times daily.     estradiol 0.0375 MG/24HR  Commonly known as:  VIVELLE-DOT  Place 1 patch onto the skin as needed (Uses for hot flashes).     famotidine 40 MG tablet  Commonly known as:  PEPCID  Take 1 tablet (40 mg total) by mouth at bedtime as needed for heartburn.     fluticasone 50 MCG/ACT nasal spray  Commonly known as:  FLONASE  Place 1 spray into both nostrils daily.     gabapentin 300 MG capsule  Commonly known as:  NEURONTIN  Take 300 mg by mouth 3 (three) times daily.     IMODIUM PO  Take 1 tablet by mouth daily.     losartan 100 MG tablet  Commonly known as:  COZAAR  Take 0.5 tablets (50 mg total) by mouth 2 (two) times daily.     Magnesium 250 MG Tabs  Take 1 tablet by mouth daily.     metoprolol 50 MG tablet  Commonly known as:  LOPRESSOR  Take 1 tablet (50 mg total) by mouth as needed.     potassium chloride 10 MEQ tablet  Commonly known as:  KLOR-CON 10  Take 1 tablet (10 mEq total) by mouth 2 (two) times daily.     rosuvastatin 20 MG tablet  Commonly known as:  CRESTOR  Take 1 tablet (20 mg total) by mouth daily.     SUMAtriptan 50 MG tablet  Commonly known as:  IMITREX  Take 50 mg by mouth every 2 (two) hours as needed for migraine. May repeat in 2 hours if headache persists or recurs.     traZODone 100 MG tablet  Commonly known as:  DESYREL  Take 2 tablets  (200 mg total) by mouth at bedtime.     Vitamin D3 2000 UNITS Tabs  Take 1 tablet by mouth every morning.  Follow-up Information    Follow up with DEVESHWAR, Fritz Pickerel, MD In 2 weeks.   Specialty:  Interventional Radiology   Why:  Our office will call with appointment [phone]   Contact information:   [address] [place] [address] [phone]        Signed: Hedy Jacob 11/10/2014, 10:50 AM   I have spent Greater Than 30 Minutes discharging Susan Gillespie.

## 2014-11-24 ENCOUNTER — Ambulatory Visit (HOSPITAL_COMMUNITY)
Admission: RE | Admit: 2014-11-24 | Discharge: 2014-11-24 | Disposition: A | Discharge status: Home / Self Care | Payer: Medicare Other | Source: Ambulatory Visit | Attending: Radiology | Admitting: Radiology

## 2014-11-24 ENCOUNTER — Other Ambulatory Visit: Payer: Self-pay | Admitting: Radiology

## 2014-11-24 DIAGNOSIS — I771 Stricture of artery: Secondary | ICD-10-CM

## 2014-11-24 LAB — PLATELET INHIBITION P2Y12: Platelet Function  P2Y12: 65 [PRU] — ABNORMAL LOW (ref 194–418)

## 2014-12-01 ENCOUNTER — Ambulatory Visit (INDEPENDENT_AMBULATORY_CARE_PROVIDER_SITE_OTHER): Payer: Medicare Other | Admitting: Neurology

## 2014-12-01 ENCOUNTER — Encounter: Payer: Self-pay | Admitting: Neurology

## 2014-12-01 VITALS — BP 108/59 | HR 74 | Ht 63.0 in | Wt 164.6 lb

## 2014-12-01 DIAGNOSIS — I708 Atherosclerosis of other arteries: Secondary | ICD-10-CM | POA: Diagnosis not present

## 2014-12-01 DIAGNOSIS — I771 Stricture of artery: Secondary | ICD-10-CM

## 2014-12-01 NOTE — Patient Instructions (Signed)
I had a long discussion with the patient with regards to the results of her MRI scan, MRA, CD angiogram and catheter angiogram and successful left subclavian angioplasty and stenting. She is doing quite well with complete resolution of her symptoms and is tolerating aspirin and Plavix without significant bruising. I recommend she discontinue Plavix after 3 months post stent. Maintain strict control of hypertension with blood pressure goal below 130/90 and lipids with LDL cholesterol goal below 100 mg percent. Return for follow-up in 6 months or call earlier if necessary.

## 2014-12-01 NOTE — Progress Notes (Signed)
[facility] [address] [place]. [place] [address] [phone]       OFFICE FOLLOW UP VISIT NOTE  Susan. Jodi Mourning Pae Date of Birth:  [birth year] Medical Record Number:  [id number]   Referring MD:  [name]  Reason for Referral:  Headache  HPI: Susan Gillespie is a pleasant 69 year old lady whose had intermittent recurrent stereotypical episodes of right-sided head abnormal pulsation or rumbling sensation on the right side of the head. This started 3 months ago and she's had multiple episodes in the last episode was 2 weeks ago. This may last for variable period of time from 5-6 minutes to up to 5-6 hours. There are no specific triggers and the episodes can occur at any time or with any activities. She finds annoying though she states it is not painful. She feels the sensation is similar to horses galloping. In only 2 of these episodes she has had headache later but not during the episode. She does have remote history of migraine headaches but feels that she outgrew them during her menopause. She's had however 2 episodes of migraines in the last few months but feels they were unrelated to these other episodes. She denies any competing symptoms in the, speech difficulty, vision problems, vertigo, diplopia, gait, balance problems or focal weakness or numbness. She feels she is not anxious during these episodes though she does take Xanax when necessary about twice a week for an unrelated anxiety. She has history of SVTs but feels she has taken her pulse during these episodes and it is not high. 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 Carotid ultrasound and transthoracic echo were unremarkable. Vascular risk factors identified included hypertension and hyperlipidemia. She had previously been on aspirin which was changed to Plavix but presently  she is on aspirin 81 mg due to easy bruising. She states her blood pressure is well controlled though it is slightly elevated at 148/66 in office today. She is on aspirin 81 mg which is tolerating well. She is also on Crestor and had lipid profile checked a few months ago and it was fine. She is not having any significant myalgias or arthralgias. Update 12/01/2014 : She returns for follow-up after initial consultation with me 2 months ago. She had MRI scan of the brain on 09/25/14 which I personally reviewed showed no acute abnormality. MRA of the brain showed no large vessel intracranial stenosis. 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 These changes were much advanced compared with previous MRA from June 2014. 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 After discussion of risks benefits patient agreed to angioplasty stenting and hence was referred to Dr. Estanislado Pandy who perform this on 11/09/14 uneventfully. Patient has been on aspirin and Plavix since then and is tolerating it well with only minor bruising. She states that hers symptoms of right hemicranial pulsations have since disappeared and she's had no episodes since her stenting. She saw Dr. Estanislado Pandy on 11/24/14 and had platelet reactivity testing done but the results are pending. She was advised to have repeat carotid and subclavian ultrasound in 4 months. ROS:   14 system review of systems is positive for   bilateral hearing aids, incontinence of bladder, easy bruising and  bleeding and all other systems negative  PMH:  Past Medical History  Diagnosis Date  . Colon polyp   . Headache(784.0)   . Hyperlipidemia   . Arthritis   . Family history of cardiovascular disease   . HTN (hypertension)   . IBS (irritable bowel syndrome)     followed by Dr. [name]  . Hypertension   . IBS (irritable bowel syndrome)   .  GERD (gastroesophageal reflux disease)   . Bleeding tendency 05/16/2012    Hx post op bleeding (epidural hematoma s/p ACDF '10; LLQ hematoma due to superficial vessel fascial bleed post colon resection; Normal platelet count; saw Dr. [name] '14  . Stroke   . Melanoma     removed from back 04/2013  . Dysrhythmia     SVT  . SVT (supraventricular tachycardia)   . Shortness of breath dyspnea     nonexertional dyspnea. random.   Marland Kitchen Anxiety   . Urinary urgency   . Liver cyst   . Diverticulitis   . Fracture of right fibula     Social History:  Social History   Social History  . Marital Status: Single    Spouse Name: N/A  . Number of Children: 2  . Years of Education: 14   Occupational History  . 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 Smoking status: Former Smoker -- 2.00 packs/day for 22 years    Types: Cigarettes    Quit date: 04/23/1985  . Smokeless tobacco: Never Used  . Alcohol Use: 2.4 oz/week    4 Glasses of wine per week     Comment: 1 drink a  day  . Drug Use: No  . Sexual Activity: Yes    Birth Control/ Protection: Surgical   Other Topics Concern  . Not on file   Social History Narrative   ** Merged History Encounter **       Domestic partner   Regular exercise-no   Right handed   Caffeine use-- 2 cups coffee daily, rare soda    Medications:   Current Outpatient Prescriptions on File Prior to Visit  Medication Sig Dispense Refill  . acetaminophen (TYLENOL) 650 MG CR tablet Take 1,300 mg by mouth every 8 (eight) hours as needed for pain.     Marland Kitchen ALPRAZolam (XANAX) 0.5 MG tablet TAKE 1 TABLET BY MOUTH 3 TIMES A DAY AS NEEDED FOR SLEEP 65 tablet 2  . aspirin 325 MG tablet Take 1 tablet (325 mg total) by mouth daily. 30 tablet 0  . Calcium Carbonate (CALCIUM 500 PO) Take 1 tablet by mouth 2 (two) times daily.    . cetirizine (ZYRTEC) 10 MG tablet Take 10 mg by mouth every morning.     . Cholecalciferol (VITAMIN D3) 2000  UNITS TABS Take 1 tablet by mouth every morning.     . clopidogrel (PLAVIX) 75 MG tablet Take 75 mg by mouth daily.    . cycloSPORINE (RESTASIS) 0.05 % ophthalmic emulsion Place 1 drop into both eyes 2 (two) times daily.    Marland Kitchen estradiol (VIVELLE-DOT) 0.0375 MG/24HR Place 1 patch onto the skin as needed (Uses for hot flashes).     . famotidine (PEPCID) 40 MG tablet Take 1 tablet (40 mg total) by mouth at bedtime as needed for heartburn. 90 tablet 3  . fluticasone (FLONASE) 50 MCG/ACT nasal spray Place 1 spray into both nostrils daily.    Marland Kitchen gabapentin (NEURONTIN) 300 MG capsule Take  300 mg by mouth 3 (three) times daily.    . Loperamide HCl (IMODIUM PO) Take 1 tablet by mouth daily.     . Magnesium 250 MG TABS Take 1 tablet by mouth daily.    . metoprolol (LOPRESSOR) 50 MG tablet Take 1 tablet (50 mg total) by mouth as needed. 90 tablet 3  . potassium chloride (KLOR-CON 10) 10 MEQ tablet Take 1 tablet (10 mEq total) by mouth 2 (two) times daily. 180 tablet 3  . rosuvastatin (CRESTOR) 20 MG tablet Take 1 tablet (20 mg total) by mouth daily. 90 tablet 3  . SUMAtriptan (IMITREX) 50 MG tablet Take 50 mg by mouth every 2 (two) hours as needed for migraine. May repeat in 2 hours if headache persists or recurs.    . traZODone (DESYREL) 100 MG tablet Take 2 tablets (200 mg total) by mouth at bedtime. (Patient taking differently: Take 100 mg by mouth at bedtime. ) 180 tablet 3   No current facility-administered medications on file prior to visit.    Allergies:   Allergies  Allergen Reactions  . Daypro [Oxaprozin] Swelling    facial  . Lisinopril     cough  . Other Itching    narcotics  . Oxaprozin     Unknown.    . Simvastatin     REACTION: myalgias and fatigue    Physical Exam General: well developed, well nourished, seated, in no evident distress Head: head normocephalic and atraumatic.   Neck: supple with no carotid or supraclavicular bruits Cardiovascular: regular rate and rhythm, no  murmurs Musculoskeletal: no deformity Skin:  no rash/petichiae.   Vascular:  Normal pulses all extremities  Neurologic Exam Mental Status: Awake and fully alert. Oriented to place and time. Recent and remote memory intact. Attention span, concentration and fund of knowledge appropriate. Mood and affect appropriate.  Cranial Nerves: Fundoscopic exam reveals sharp disc margins. Pupils equal, briskly reactive to light. Extraocular movements full without nystagmus. Visual fields full to confrontation. Hearing intact. Facial sensation intact. Face, tongue, palate moves normally and symmetrically.  Motor: Normal bulk and tone. Normal strength in all tested extremity muscles. Sensory.: intact to touch , pinprick , position and vibratory sensation.  Coordination: Rapid alternating movements normal in all extremities. Finger-to-nose and heel-to-shin performed accurately bilaterally. Gait and Station: Arises from chair without difficulty. Stance is normal. Gait demonstrates normal stride length and balance . Able to heel, toe and tandem walk without difficulty.  Reflexes: 1+ and symmetric. Toes downgoing.      ASSESSMENT: 4 year lady with transient recurrent episodes of right hemicranial pulsations, rumbling sensation of unclear etiology. She was found to have severe left subclavian origin stenosis status post endovascular revascularization of severe symptomatic stenosis of the left subclavian artery with stent assisted angioplasty  11/09/14 and has shown subjective resolution of her episodess.   PLAN: I had a long discussion with the patient with regards to the results of her MRI scan, MRA, CT angiogram and catheter angiogram and successful left subclavian angioplasty and stenting. She is doing quite well with complete resolution of her symptoms and is tolerating aspirin and Plavix without significant bruising. I recommend she discontinue Plavix after 3 months post stent. Maintain strict control of  hypertension with blood pressure goal below 130/90 and lipids with LDL cholesterol goal below 100 mg percent. Return for follow-up in 6 months or call earlier if necessary.  Antony Contras, MD Note: This document was prepared with digital dictation and possible smart phrase technology. Any  transcriptional errors that result from this process are unintentional.

## 2014-12-10 ENCOUNTER — Telehealth (HOSPITAL_COMMUNITY): Payer: Self-pay | Admitting: *Deleted

## 2014-12-10 NOTE — Telephone Encounter (Signed)
Called and spoke with patient.  Informed her that Dr. Estanislado Pandy had reviewed her labs and that she should continue current dose of Plavix.  Pt states that she is seeing Dr. Leonie Man and he has instructed her to take Plavix for 3 more months then to stop Plavix and continue with Asprin.

## 2015-03-02 ENCOUNTER — Other Ambulatory Visit: Payer: Self-pay | Admitting: Radiology

## 2015-03-02 DIAGNOSIS — I6529 Occlusion and stenosis of unspecified carotid artery: Secondary | ICD-10-CM

## 2015-03-15 ENCOUNTER — Ambulatory Visit (HOSPITAL_COMMUNITY)
Admission: RE | Admit: 2015-03-15 | Discharge: 2015-03-15 | Disposition: A | Discharge status: Home / Self Care | Payer: Medicare Other | Source: Ambulatory Visit | Attending: Radiology | Admitting: Radiology

## 2015-03-15 DIAGNOSIS — I6529 Occlusion and stenosis of unspecified carotid artery: Secondary | ICD-10-CM

## 2015-03-15 DIAGNOSIS — G458 Other transient cerebral ischemic attacks and related syndromes: Secondary | ICD-10-CM | POA: Diagnosis not present

## 2015-03-15 NOTE — Progress Notes (Signed)
VASCULAR LAB PRELIMINARY  PRELIMINARY  PRELIMINARY  PRELIMINARY  Carotid duplex  completed.    Preliminary report:  Right:  1-39% ICA stenosis (high end of scale).  Left:  40-59% internal carotid artery stenosis.   Right:  Vertebral artery flow is antegrade.  Left:  Vertebral artery is bidirectional and the subclavian artery waveform is atypical which may suggest restenosis of the left subclavian stent.     Rayner Erman, RVT 03/15/2015, 4:44 PM

## 2015-04-02 ENCOUNTER — Telehealth (HOSPITAL_COMMUNITY): Payer: Self-pay | Admitting: Interventional Radiology

## 2015-04-02 NOTE — Telephone Encounter (Signed)
Patient returned our call concerning her f/u. Per Dr. Estanislado Pandy she is to have her BP checked in both arms standing and sitting and call us back with those numbers. Once she calls back w/ those numbers Deveshwar will decide if she needs any other f/u other than her suggested US carotid Doppler in 6 months. Pt understands and agrees with this plan. JM

## 2015-04-06 ENCOUNTER — Telehealth (HOSPITAL_COMMUNITY): Payer: Self-pay

## 2015-04-06 NOTE — Telephone Encounter (Signed)
Informed pt to f/u with US Carotid in 6 months per Dr. Estanislado Pandy. Pt was advised to call back if she starts to have any new symptoms. Pt agreed with this plan. AW

## 2015-04-21 DIAGNOSIS — I771 Stricture of artery: Secondary | ICD-10-CM | POA: Diagnosis not present

## 2015-04-21 DIAGNOSIS — R399 Unspecified symptoms and signs involving the genitourinary system: Secondary | ICD-10-CM | POA: Diagnosis not present

## 2015-05-18 ENCOUNTER — Other Ambulatory Visit (HOSPITAL_COMMUNITY): Payer: Self-pay | Admitting: Interventional Radiology

## 2015-05-18 DIAGNOSIS — I771 Stricture of artery: Secondary | ICD-10-CM

## 2015-05-21 ENCOUNTER — Ambulatory Visit (INDEPENDENT_AMBULATORY_CARE_PROVIDER_SITE_OTHER): Payer: PPO | Admitting: Cardiology

## 2015-05-21 ENCOUNTER — Encounter: Payer: Self-pay | Admitting: Cardiology

## 2015-05-21 VITALS — BP 134/70 | HR 64 | Ht 63.0 in | Wt 164.2 lb

## 2015-05-21 DIAGNOSIS — I10 Essential (primary) hypertension: Secondary | ICD-10-CM | POA: Diagnosis not present

## 2015-05-21 DIAGNOSIS — R0789 Other chest pain: Secondary | ICD-10-CM | POA: Insufficient documentation

## 2015-05-21 DIAGNOSIS — I471 Supraventricular tachycardia: Secondary | ICD-10-CM | POA: Diagnosis not present

## 2015-05-21 LAB — CBC WITH DIFFERENTIAL/PLATELET
BASOS ABS: 0 10*3/uL (ref 0.0–0.1)
BASOS PCT: 0 % (ref 0–1)
EOS ABS: 0.2 10*3/uL (ref 0.0–0.7)
Eosinophils Relative: 3 % (ref 0–5)
HCT: 34.9 % — ABNORMAL LOW (ref 36.0–46.0)
Hemoglobin: 11.9 g/dL — ABNORMAL LOW (ref 12.0–15.0)
Lymphocytes Relative: 18 % (ref 12–46)
Lymphs Abs: 1.2 10*3/uL (ref 0.7–4.0)
MCH: 33.1 pg (ref 26.0–34.0)
MCHC: 34.1 g/dL (ref 30.0–36.0)
MCV: 97.2 fL (ref 78.0–100.0)
MPV: 9.3 fL (ref 8.6–12.4)
Monocytes Absolute: 0.4 10*3/uL (ref 0.1–1.0)
Monocytes Relative: 7 % (ref 3–12)
NEUTROS PCT: 72 % (ref 43–77)
Neutro Abs: 4.6 10*3/uL (ref 1.7–7.7)
PLATELETS: 255 10*3/uL (ref 150–400)
RBC: 3.59 MIL/uL — AB (ref 3.87–5.11)
RDW: 14.3 % (ref 11.5–15.5)
WBC: 6.4 10*3/uL (ref 4.0–10.5)

## 2015-05-21 NOTE — Patient Instructions (Signed)
Medication Instructions:  Your physician recommends that you continue on your current medications as directed. Please refer to the Current Medication list given to you today.  Labwork: cbc  Testing/Procedures: Your physician has requested that you have a lexiscan myoview. For further information please visit [email or web address]. Please follow instruction sheet, as given.  Follow-Up: Your physician wants you to follow-up in: 6 month ov with Dr Mallie Snooks will receive a reminder letter in the mail two months in advance. If you don't receive a letter, please call our office to schedule the follow-up appointment.  If you need a refill on your cardiac medications before your next appointment, please call your pharmacy.

## 2015-05-21 NOTE — Progress Notes (Signed)
Cardiology Office Note   Date:  05/21/2015   ID:  Susan Gillespie, DOB 1946/01/25, MRN [id number]  PCP:  Susan Bellows, MD  Cardiologist: Susan Coco MD  Chief Complaint  Patient presents with  . benign hypertension      History of Present Illness: Susan Gillespie is a 70 y.o. female who presents for a one-year follow-up office visit.  She comes in after a long absence.   In March 2014 she suffered a small stroke. Dr. Leonie Gillespie is her neurologist. For a while she was on Plavix and aspirin and presently is on 325 mg aspirin alone. The patient has a past history of paroxysmal supraventricular tachycardia. She has had 2 episodes of SVT in October 2014. She takes metoprolol and the SVT usually breaks within 15 minutes.  She has not had any recent episodes of SVT. The patient had an echocardiogram on 06/29/12 at the time of her stroke and the echo showed normal LV function with ejection fraction of 65-75% and there was grade 1 diastolic dysfunction. The patient does not have any history of known ischemic heart disease. She had a remote Cardiolite stress test about 10 years ago which was normal. The patient has a history of high blood pressure. She has a strong family history of ischemic heart disease. Since we last saw her she was diagnosed with a left subclavian steal.  She underwent stenting by Dr. Stillmore Gillespie.  She states that there is concern that the stent may be closing off.  She has once again developed a differential with blood pressure is lower in the left arm than in the right arm she also experiences some occasional left-sided paresthesias of her hand.  She is scheduled for a follow-up angiogram next Thursday for evaluation of her subclavian steal. She comes in to our office today because of concern over exertional chest tightness.  She has experienced exertional dyspnea and a tight feeling across her shoulders.  She is concerned about angina because of her strong family  history. He has a past history of anemia.  Her hemoglobin last summer was 10.  She put herself on over-the-counter iron pills.  We are rechecking a CBC today. Past Medical History  Diagnosis Date  . Colon polyp   . Headache(784.0)   . Hyperlipidemia   . Arthritis   . Family history of cardiovascular disease   . HTN (hypertension)   . IBS (irritable bowel syndrome)     followed by Dr. Earlean Gillespie  . Hypertension   . IBS (irritable bowel syndrome)   . GERD (gastroesophageal reflux disease)   . Bleeding tendency (Susan Gillespie) 05/16/2012    Hx post op bleeding (epidural hematoma s/p ACDF '10; LLQ hematoma due to superficial vessel fascial bleed post colon resection; Normal platelet count; saw Dr. Beryle Gillespie '14  . Stroke ([place])   . Melanoma ([place])     removed from back 04/2013  . Dysrhythmia     SVT  . SVT (supraventricular tachycardia) ([place])   . Shortness of breath dyspnea     nonexertional dyspnea. random.   Marland Kitchen Anxiety   . Urinary urgency   . Liver cyst   . Diverticulitis   . Fracture of right fibula     Past Surgical History  Procedure Laterality Date  . Spine surgery      cervical fusion  . Colon surgery    . Abdominal hysterectomy    . Tonsillectomy    . Cholecystectomy    . Appendectomy    .  Skin cancer excised  04/2013  . Back surgery    . Tonsillectomy    . Breast surgery      BIL breast augmentation  . Vagina surgery      anterior posterior repair  . Sigmoidectomy      SIGMOID COLECTOMY  . Joint replacement Left     thumb  . Eye surgery      eyelid drooping fixed  . Tubal ligation    . Radiology with anesthesia N/A 11/09/2014    Procedure: STENT PLACEMENT;  Surgeon: Susan Bras, MD;  Location: [facility];  Service: Radiology;  Laterality: N/A;     Current Outpatient Prescriptions  Medication Sig Dispense Refill  . acetaminophen (TYLENOL) 650 MG CR tablet Take 1,300 mg by mouth every 8 (eight) hours as needed for pain.     Marland Kitchen ALPRAZolam (XANAX) 0.5 MG tablet TAKE 1  TABLET BY MOUTH 3 TIMES A DAY AS NEEDED FOR SLEEP 65 tablet 2  . aspirin 325 MG tablet Take 1 tablet (325 mg total) by mouth daily. 30 tablet 0  . Calcium Carbonate (CALCIUM 500 PO) Take 1 tablet by mouth 2 (two) times daily.    . celecoxib (CELEBREX) 200 MG capsule Take 200 mg by mouth daily as needed for mild pain.    . cetirizine (ZYRTEC) 10 MG tablet Take 10 mg by mouth every morning.     . Cholecalciferol (VITAMIN D3) 2000 UNITS TABS Take 1 tablet by mouth every morning.     . cycloSPORINE (RESTASIS) 0.05 % ophthalmic emulsion Place 1 drop into both eyes 2 (two) times daily.    Marland Kitchen estradiol (VIVELLE-DOT) 0.05 MG/24HR patch Place 1 patch onto the skin 2 (two) times a week.  3  . fluticasone (FLONASE) 50 MCG/ACT nasal spray Place 1 spray into both nostrils daily.    Marland Kitchen gabapentin (NEURONTIN) 300 MG capsule Take 300 mg by mouth 2 (two) times daily.     . Loperamide HCl (IMODIUM PO) Take 1 tablet by mouth daily.     Marland Kitchen losartan (COZAAR) 50 MG tablet Take 50 mg by mouth 2 (two) times daily.  3  . Magnesium 250 MG TABS Take 1 tablet by mouth daily.    . metoprolol (LOPRESSOR) 50 MG tablet Take 1 tablet (50 mg total) by mouth as needed. 90 tablet 3  . omeprazole (PRILOSEC) 40 MG capsule Take 40 mg by mouth daily.  3  . potassium chloride (KLOR-CON 10) 10 MEQ tablet Take 1 tablet (10 mEq total) by mouth 2 (two) times daily. (Patient taking differently: Take 10 mEq by mouth daily. ) 180 tablet 3  . rosuvastatin (CRESTOR) 20 MG tablet Take 1 tablet (20 mg total) by mouth daily. 90 tablet 3  . SUMAtriptan (IMITREX) 50 MG tablet Take 50 mg by mouth every 2 (two) hours as needed for migraine. May repeat in 2 hours if headache persists or recurs.    . traZODone (DESYREL) 100 MG tablet Take 100 mg by mouth at bedtime.    . VESICARE 5 MG tablet Take 5 mg by mouth daily.  3   No current facility-administered medications for this visit.    Allergies:   Daypro; Lisinopril; Other; and Simvastatin    Social  History:  The patient  reports that she quit smoking about 30 years ago. Her smoking use included Cigarettes. She has a 44 pack-year smoking history. She has never used smokeless tobacco. She reports that she drinks about 2.4 oz of alcohol per week. She  reports that she does not use illicit drugs.   Family History:  The patient's family history includes Cancer in her other; Heart attack in her brother and sister; Heart disease in her brother, father, and sister; Hyperlipidemia in her other; Hypertension in her other; Stroke in her other and son.    ROS:  Please see the history of present illness.   Otherwise, review of systems are positive for none.   All other systems are reviewed and negative.    PHYSICAL EXAM: VS:  BP 134/70 mmHg  Pulse 64  Ht 5\' 3"  (1.6 m)  Wt 164 lb 3.2 oz (74.481 kg)  BMI 29.09 kg/m2 , BMI Body mass index is 29.09 kg/(m^2). GEN: Well nourished, well developed, in no acute distress HEENT: normal Neck: no JVD, carotid bruits, or masses Cardiac: RRR; no murmurs, rubs, or gallops,no edema  Respiratory:  clear to auscultation bilaterally, normal work of breathing GI: soft, nontender, nondistended, + BS MS: no deformity or atrophy Skin: warm and dry, no rash Neuro:  Strength and sensation are intact Psych: euthymic mood, full affect   EKG:  EKG is ordered today. The ekg ordered today demonstrates normal sinus rhythm.  Within normal limits.  Heart rate is 67 bpm   Recent Labs: 11/10/2014: BUN 16; Creatinine, Ser 0.95; Hemoglobin 10.3*; Platelets 172; Potassium 4.3; Sodium 137    Lipid Panel    Component Value Date/Time   CHOL 192 09/15/2013 0922   TRIG 194.0* 09/15/2013 0922   HDL 86.80 09/15/2013 0922   CHOLHDL 2 09/15/2013 0922   VLDL 38.8 09/15/2013 0922   LDLCALC 66 09/15/2013 0922      Wt Readings from Last 3 Encounters:  05/21/15 164 lb 3.2 oz (74.481 kg)  12/01/14 164 lb 9.6 oz (74.662 kg)  11/09/14 162 lb 0.6 oz (73.5 kg)        ASSESSMENT  AND PLAN:  1.  Essential hypertension 2.  History of left subclavian steal syndrome treated with stent by Dr. Kathee Delton. 3.  Recent onset of exertional interscapular pressure and shortness of breath, relieved by rest. 4.  Strong family history of coronary artery disease 5.  History of old stroke 2014. 6.  past history of paroxysmal supraventricular tachycardia 7.  Past history of anemia.  Hemoglobin 10 in summer 2016   Current medicines are reviewed at length with the patient today.  The patient does not have concerns regarding medicines.  The following changes have been made:  no change  Labs/ tests ordered today include:   Orders Placed This Encounter  Procedures  . CBC with Differential/Platelet  . Myocardial Perfusion Imaging  . EKG 12-Lead     Disposition: We will have her return for a Lexiscan Myoview stress test to evaluate possible myocardial ischemia. We will get CBC to follow up on previous anemia which could be playing a role. Following my retirement she will see Dr. [name] in 6 months.  Dr. [name] also follows her boyfriend.  Berna Spare MD 05/21/2015 12:45 PM    [address], [place], [place]  [address] Phone: [phone]; Fax: [phone]

## 2015-05-24 ENCOUNTER — Telehealth: Payer: Self-pay | Admitting: *Deleted

## 2015-05-24 NOTE — Telephone Encounter (Signed)
Advised patient of lab results  

## 2015-05-24 NOTE — Telephone Encounter (Signed)
-----   Message from Darlin Coco, MD sent at 05/23/2015  2:05 PM EST ----- Please report. Hgb is better but still low. Continue OTC iron pills.

## 2015-05-26 ENCOUNTER — Telehealth (HOSPITAL_COMMUNITY): Payer: Self-pay

## 2015-05-26 ENCOUNTER — Other Ambulatory Visit: Payer: Self-pay | Admitting: Radiology

## 2015-05-26 ENCOUNTER — Telehealth (HOSPITAL_COMMUNITY): Payer: Self-pay | Admitting: *Deleted

## 2015-05-26 ENCOUNTER — Other Ambulatory Visit: Payer: Self-pay | Admitting: General Surgery

## 2015-05-26 NOTE — Telephone Encounter (Signed)
Left message on voicemail in reference to upcoming appointment scheduled for 05/31/15 Phone number given for a call back so details instructions can be given. Hubbard Robinson, RN

## 2015-05-26 NOTE — Telephone Encounter (Signed)
Patient given detailed instructions per Myocardial Perfusion Study Information Sheet for the test on 05/31/2015 at 7:45. Patient notified to arrive 15 minutes early and that it is imperative to arrive on time for appointment to keep from having the test rescheduled.  If you need to cancel or reschedule your appointment, please call the office within 24 hours of your appointment. Failure to do so may result in a cancellation of your appointment, and a $50 no show fee. Patient verbalized understanding.EHK

## 2015-05-27 ENCOUNTER — Ambulatory Visit (HOSPITAL_COMMUNITY)
Admission: RE | Admit: 2015-05-27 | Discharge: 2015-05-27 | Disposition: A | Discharge status: Home / Self Care | Payer: PPO | Source: Ambulatory Visit | Attending: Interventional Radiology | Admitting: Interventional Radiology

## 2015-05-27 ENCOUNTER — Other Ambulatory Visit (HOSPITAL_COMMUNITY): Payer: Self-pay | Admitting: Interventional Radiology

## 2015-05-27 DIAGNOSIS — E785 Hyperlipidemia, unspecified: Secondary | ICD-10-CM | POA: Diagnosis not present

## 2015-05-27 DIAGNOSIS — Z87891 Personal history of nicotine dependence: Secondary | ICD-10-CM | POA: Diagnosis not present

## 2015-05-27 DIAGNOSIS — Z86010 Personal history of colonic polyps: Secondary | ICD-10-CM | POA: Insufficient documentation

## 2015-05-27 DIAGNOSIS — Z8673 Personal history of transient ischemic attack (TIA), and cerebral infarction without residual deficits: Secondary | ICD-10-CM | POA: Diagnosis not present

## 2015-05-27 DIAGNOSIS — Y812 Prosthetic and other implants, materials and accessory general- and plastic-surgery devices associated with adverse incidents: Secondary | ICD-10-CM | POA: Diagnosis not present

## 2015-05-27 DIAGNOSIS — M1990 Unspecified osteoarthritis, unspecified site: Secondary | ICD-10-CM | POA: Insufficient documentation

## 2015-05-27 DIAGNOSIS — F419 Anxiety disorder, unspecified: Secondary | ICD-10-CM | POA: Insufficient documentation

## 2015-05-27 DIAGNOSIS — K219 Gastro-esophageal reflux disease without esophagitis: Secondary | ICD-10-CM | POA: Insufficient documentation

## 2015-05-27 DIAGNOSIS — I10 Essential (primary) hypertension: Secondary | ICD-10-CM | POA: Insufficient documentation

## 2015-05-27 DIAGNOSIS — T82856A Stenosis of peripheral vascular stent, initial encounter: Secondary | ICD-10-CM | POA: Insufficient documentation

## 2015-05-27 DIAGNOSIS — Z85820 Personal history of malignant melanoma of skin: Secondary | ICD-10-CM | POA: Insufficient documentation

## 2015-05-27 DIAGNOSIS — R200 Anesthesia of skin: Secondary | ICD-10-CM | POA: Diagnosis not present

## 2015-05-27 DIAGNOSIS — Z7982 Long term (current) use of aspirin: Secondary | ICD-10-CM | POA: Insufficient documentation

## 2015-05-27 DIAGNOSIS — I659 Occlusion and stenosis of unspecified precerebral artery: Secondary | ICD-10-CM | POA: Diagnosis not present

## 2015-05-27 DIAGNOSIS — K589 Irritable bowel syndrome without diarrhea: Secondary | ICD-10-CM | POA: Insufficient documentation

## 2015-05-27 DIAGNOSIS — I771 Stricture of artery: Secondary | ICD-10-CM

## 2015-05-27 DIAGNOSIS — I471 Supraventricular tachycardia: Secondary | ICD-10-CM | POA: Diagnosis not present

## 2015-05-27 DIAGNOSIS — Z8249 Family history of ischemic heart disease and other diseases of the circulatory system: Secondary | ICD-10-CM | POA: Insufficient documentation

## 2015-05-27 DIAGNOSIS — I6522 Occlusion and stenosis of left carotid artery: Secondary | ICD-10-CM | POA: Diagnosis not present

## 2015-05-27 LAB — CBC
HEMATOCRIT: 37.2 % (ref 36.0–46.0)
HEMOGLOBIN: 12.5 g/dL (ref 12.0–15.0)
MCH: 33 pg (ref 26.0–34.0)
MCHC: 33.6 g/dL (ref 30.0–36.0)
MCV: 98.2 fL (ref 78.0–100.0)
Platelets: 235 10*3/uL (ref 150–400)
RBC: 3.79 MIL/uL — ABNORMAL LOW (ref 3.87–5.11)
RDW: 13.1 % (ref 11.5–15.5)
WBC: 5.2 10*3/uL (ref 4.0–10.5)

## 2015-05-27 LAB — BASIC METABOLIC PANEL
Anion gap: 9 (ref 5–15)
BUN: 14 mg/dL (ref 6–20)
CHLORIDE: 103 mmol/L (ref 101–111)
CO2: 28 mmol/L (ref 22–32)
Calcium: 10 mg/dL (ref 8.9–10.3)
Creatinine, Ser: 1.13 mg/dL — ABNORMAL HIGH (ref 0.44–1.00)
GFR calc Af Amer: 55 mL/min — ABNORMAL LOW (ref >60)
GFR calc non Af Amer: 48 mL/min — ABNORMAL LOW (ref >60)
GLUCOSE: 104 mg/dL — AB (ref 65–99)
POTASSIUM: 4.3 mmol/L (ref 3.5–5.1)
Sodium: 140 mmol/L (ref 135–145)

## 2015-05-27 LAB — APTT: APTT: 30 s (ref 24–37)

## 2015-05-27 LAB — PROTIME-INR
INR: 0.98 (ref 0.00–1.49)
Prothrombin Time: 13.2 seconds (ref 11.6–15.2)

## 2015-05-27 LAB — POCT ACTIVATED CLOTTING TIME: ACTIVATED CLOTTING TIME: 167 s

## 2015-05-27 MED ORDER — MIDAZOLAM HCL 2 MG/2ML IJ SOLN
INTRAMUSCULAR | Status: AC | PRN
  Administered 2015-05-27: 1 mg via INTRAVENOUS

## 2015-05-27 MED ORDER — SODIUM CHLORIDE 0.9 % IV SOLN: INTRAVENOUS | Status: AC

## 2015-05-27 MED ORDER — IOHEXOL 300 MG/ML  SOLN
150.0000 mL | Freq: Once | INTRAMUSCULAR | Status: AC | PRN
  Administered 2015-05-27: 50 mL via INTRA_ARTERIAL

## 2015-05-27 MED ORDER — LIDOCAINE HCL 1 % IJ SOLN
INTRAMUSCULAR | Status: AC
  Filled 2015-05-27: qty 20

## 2015-05-27 MED ORDER — MIDAZOLAM HCL 2 MG/2ML IJ SOLN
INTRAMUSCULAR | Status: AC
  Filled 2015-05-27: qty 2

## 2015-05-27 MED ORDER — CLOPIDOGREL BISULFATE 75 MG PO TABS
ORAL_TABLET | ORAL | Status: AC
  Administered 2015-05-27: 150 mg via ORAL
  Filled 2015-05-27: qty 2

## 2015-05-27 MED ORDER — HEPARIN SODIUM (PORCINE) 1000 UNIT/ML IJ SOLN
INTRAMUSCULAR | Status: AC | PRN
  Administered 2015-05-27: 500 [IU] via INTRAVENOUS
  Administered 2015-05-27: 1000 [IU] via INTRAVENOUS

## 2015-05-27 MED ORDER — FENTANYL CITRATE (PF) 100 MCG/2ML IJ SOLN
INTRAMUSCULAR | Status: AC | PRN
  Administered 2015-05-27: 25 ug via INTRAVENOUS

## 2015-05-27 MED ORDER — FENTANYL CITRATE (PF) 100 MCG/2ML IJ SOLN
INTRAMUSCULAR | Status: AC
  Filled 2015-05-27: qty 2

## 2015-05-27 MED ORDER — SODIUM CHLORIDE 0.9 % IV SOLN
INTRAVENOUS | Status: DC
  Administered 2015-05-27: 09:00:00 via INTRAVENOUS

## 2015-05-27 MED ORDER — HEPARIN SODIUM (PORCINE) 1000 UNIT/ML IJ SOLN
INTRAMUSCULAR | Status: AC
  Filled 2015-05-27: qty 1

## 2015-05-27 MED ORDER — CLOPIDOGREL BISULFATE 75 MG PO TABS
150.0000 mg | ORAL_TABLET | Freq: Once | ORAL | Status: AC
  Administered 2015-05-27: 150 mg via ORAL

## 2015-05-27 NOTE — Sedation Documentation (Signed)
IR team placing exoseal

## 2015-05-27 NOTE — Discharge Instructions (Signed)

## 2015-05-27 NOTE — Progress Notes (Signed)
BP Left arm- 119/55 BP Right Arm- 127/60

## 2015-05-27 NOTE — Procedures (Signed)
S/P bilateral common carotid and RT Vertebral arteriogram,and Lt subclavian arteriogram. RT CFa approach. Findings. 1.LT subclavian steal due to severe Lt subclavian INTRASTENT stenosis of > 95 %

## 2015-05-27 NOTE — Sedation Documentation (Signed)
Repeat 100 NS bolus per MD

## 2015-05-27 NOTE — Sedation Documentation (Signed)
Patient is resting comfortably. 

## 2015-05-27 NOTE — Sedation Documentation (Signed)
Dsg to R groin intact

## 2015-05-27 NOTE — Sedation Documentation (Signed)
Holding pressure to R groin 

## 2015-05-27 NOTE — Sedation Documentation (Signed)
100 NS bolus infusing per MD

## 2015-05-27 NOTE — H&P (Signed)
Chief Complaint: Patient was seen in consultation today for cerebral arteriogram at the request of Dr Leonie Man  Referring Physician(s): Dr Leonie Man  History of Present Illness: Susan Gillespie is a 70 y.o. female   Pt with Hx Left subclavian artery stenosis Angioplasty/stent placed 10/2014 Has noted some change/diminishing pulse at left wrist and numbness of Rt hand  Since Nov-Dec 2016 Doppler studies 02/2015: VASCULAR LAB PRELIMINARY PRELIMINARY PRELIMINARY PRELIMINARY  Carotid duplex completed.  Preliminary report: Right: 1-39% ICA stenosis (high end of scale). Left: 40-59% internal carotid artery stenosis. Right: Vertebral artery flow is antegrade. Left: Vertebral artery is bidirectional and the subclavian artery waveform is atypical which may suggest restenosis of the left subclavian stent.   Now scheduled for cerebral arteriogram with dr Estanislado Pandy  Pt has also noted shortness of breath with activity and pain across shoulders ---scheduled for evaluation with Dr Mare Ferrari Monday   Past Medical History  Diagnosis Date  . Colon polyp   . Headache(784.0)   . Hyperlipidemia   . Arthritis   . Family history of cardiovascular disease   . HTN (hypertension)   . IBS (irritable bowel syndrome)     followed by Dr. [name]  . Hypertension   . IBS (irritable bowel syndrome)   . GERD (gastroesophageal reflux disease)   . Bleeding tendency ([place] [facility]) 05/16/2012    Hx post op bleeding (epidural hematoma s/p ACDF '10; LLQ hematoma due to superficial vessel fascial bleed post colon resection; Normal platelet count; saw Dr. [name] '14  . Stroke ([place])   . Melanoma ([place])     removed from back 04/2013  . Dysrhythmia     SVT  . SVT (supraventricular tachycardia) ([place])   . Shortness of breath dyspnea     nonexertional dyspnea. random.   Marland Kitchen Anxiety   . Urinary urgency   . Liver cyst   . Diverticulitis   . Fracture of right fibula     Past Surgical History  Procedure  Laterality Date  . Spine surgery      cervical fusion  . Colon surgery    . Abdominal hysterectomy    . Tonsillectomy    . Cholecystectomy    . Appendectomy    . Skin cancer excised  04/2013  . Back surgery    . Tonsillectomy    . Breast surgery      BIL breast augmentation  . Vagina surgery      anterior posterior repair  . Sigmoidectomy      SIGMOID COLECTOMY  . Joint replacement Left     thumb  . Eye surgery      eyelid drooping fixed  . Tubal ligation    . Radiology with anesthesia N/A 11/09/2014    Procedure: STENT PLACEMENT;  Surgeon: Luanne Bras, MD;  Location: [facility];  Service: Radiology;  Laterality: N/A;    Allergies: Daypro; Lisinopril; Other; and Simvastatin  Medications: Prior to Admission medications   Medication Sig Start Date End Date Taking? Authorizing Provider  acetaminophen (TYLENOL) 650 MG CR tablet Take 1,300 mg by mouth every 8 (eight) hours as needed for pain.    Yes Historical Provider, MD  ALPRAZolam (XANAX) 0.5 MG tablet TAKE 1 TABLET BY MOUTH 3 TIMES A DAY AS NEEDED FOR SLEEP   Yes Janith Lima, MD  aspirin 325 MG tablet Take 1 tablet (325 mg total) by mouth daily. 11/10/14  Yes Hedy Jacob, PA-C  Calcium Carbonate (CALCIUM 500 PO) Take 1 tablet by mouth 2 (  two) times daily.   Yes Historical Provider, MD  celecoxib (CELEBREX) 200 MG capsule Take 200 mg by mouth daily as needed for mild pain.   Yes Historical Provider, MD  cetirizine (ZYRTEC) 10 MG tablet Take 10 mg by mouth every morning.    Yes Historical Provider, MD  Cholecalciferol (VITAMIN D3) 2000 UNITS TABS Take 1 tablet by mouth every morning.    Yes Historical Provider, MD  cycloSPORINE (RESTASIS) 0.05 % ophthalmic emulsion Place 1 drop into both eyes 2 (two) times daily.   Yes Historical Provider, MD  estradiol (VIVELLE-DOT) 0.05 MG/24HR patch Place 1 patch onto the skin 2 (two) times a week. 04/21/15  Yes Historical Provider, MD  FERROUS SULFATE PO Take by mouth daily.   Yes  Historical Provider, MD  fluticasone (FLONASE) 50 MCG/ACT nasal spray Place 1 spray into both nostrils daily.   Yes Historical Provider, MD  gabapentin (NEURONTIN) 300 MG capsule Take 300 mg by mouth 2 (two) times daily.    Yes Historical Provider, MD  Loperamide HCl (IMODIUM PO) Take 1 tablet by mouth daily.    Yes Historical Provider, MD  losartan (COZAAR) 50 MG tablet Take 50 mg by mouth 2 (two) times daily. 11/15/14  Yes Historical Provider, MD  Magnesium 250 MG TABS Take 1 tablet by mouth daily.   Yes Historical Provider, MD  omeprazole (PRILOSEC) 40 MG capsule Take 40 mg by mouth daily. 04/21/15  Yes Historical Provider, MD  potassium chloride (KLOR-CON 10) 10 MEQ tablet Take 1 tablet (10 mEq total) by mouth 2 (two) times daily. Patient taking differently: Take 10 mEq by mouth daily.  09/15/13  Yes Janith Lima, MD  rosuvastatin (CRESTOR) 20 MG tablet Take 1 tablet (20 mg total) by mouth daily. 09/16/13  Yes Janith Lima, MD  traZODone (DESYREL) 100 MG tablet Take 100 mg by mouth at bedtime.   Yes Historical Provider, MD  VESICARE 5 MG tablet Take 5 mg by mouth daily. 04/22/15  Yes Historical Provider, MD  metoprolol (LOPRESSOR) 50 MG tablet Take 1 tablet (50 mg total) by mouth as needed. 09/15/13   Janith Lima, MD  SUMAtriptan (IMITREX) 50 MG tablet Take 50 mg by mouth every 2 (two) hours as needed for migraine. May repeat in 2 hours if headache persists or recurs.    Historical Provider, MD     Family History  Problem Relation Age of Onset  . Hyperlipidemia Other   . Hypertension Other   . Cancer Other     lung, esophagus, stomach  . Stroke Other   . Heart disease Father   . Heart disease Sister   . Heart attack Sister   . Heart disease Brother   . Heart attack Brother   . Stroke Son     Social History   Social History  . Marital Status: Divorced    Spouse Name: N/A  . Number of Children: 2  . Years of Education: 14   Occupational History  . 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 Smoking status: Former Smoker -- 2.00 packs/day for 22 years    Types: Cigarettes    Quit date: 04/23/1985  . Smokeless tobacco: Never Used  . Alcohol Use: 2.4 oz/week    4 Glasses of wine per week     Comment: 1 drink a  day  . Drug Use: No  . Sexual Activity: Yes    Birth Control/  Protection: Surgical   Other Topics Concern  . Not on file   Social History Narrative   ** Merged History Encounter **       Domestic partner   Regular exercise-no   Right handed   Caffeine use-- 2 cups coffee daily, rare soda    Review of Systems: A 12 point ROS discussed and pertinent positives are indicated in the HPI above.  All other systems are negative.  Review of Systems  Constitutional: Negative for fever, activity change, appetite change and fatigue.  HENT: Negative for tinnitus.   Eyes: Negative for visual disturbance.  Respiratory: Positive for shortness of breath.   Cardiovascular: Negative for chest pain.  Gastrointestinal: Negative for abdominal pain.  Musculoskeletal: Positive for back pain. Negative for gait problem.  Neurological: Positive for tremors, weakness and numbness. Negative for dizziness, seizures, syncope, facial asymmetry, speech difficulty, light-headedness and headaches.  Psychiatric/Behavioral: Negative for behavioral problems and confusion.    Vital Signs: BP 137/58 mmHg  Pulse 69  Temp(Src) 97.6 F (36.4 C)  Ht 5\' 3"  (1.6 m)  Wt 163 lb (73.936 kg)  BMI 28.88 kg/m2  SpO2 100%  Physical Exam  Constitutional: She is oriented to person, place, and time. She appears well-nourished.  Eyes: EOM are normal.  Neck: Normal range of motion.  Cardiovascular: Normal rate, regular rhythm and normal heart sounds.   No murmur heard. Pulmonary/Chest: Effort normal and breath sounds normal. She has no wheezes.  Abdominal: Soft. Bowel sounds are normal. There is no tenderness.  Musculoskeletal: Normal range of motion.    diminished pulse Left wrist  Neurological: She is alert and oriented to person, place, and time.  Skin: Skin is warm and dry.  Psychiatric: She has a normal mood and affect. Her behavior is normal. Judgment and thought content normal.  Nursing note and vitals reviewed.   Mallampati Score:  MD Evaluation Airway: WNL Heart: WNL Abdomen: WNL Chest/ Lungs: WNL ASA  Classification: 2 Mallampati/Airway Score: One  Imaging: No results found.  Labs:  CBC:  Recent Labs  11/02/14 1348 11/10/14 0220 05/21/15 1156 05/27/15 0830  WBC 4.9 5.7 6.4 5.2  HGB 12.1 10.3* 11.9* 12.5  HCT 36.5 30.5* 34.9* 37.2  PLT 233 172 255 235    COAGS:  Recent Labs  10/15/14 0731 11/02/14 1348 05/27/15 0830  INR 0.99 0.97 0.98  APTT 29 29 30     BMP:  Recent Labs  10/15/14 0731 11/02/14 1348 11/10/14 0220  NA 137 141 137  K 4.3 4.7 4.3  CL 101 105 107  CO2 29 29 24   GLUCOSE 107* 109* 113*  BUN 10 11 16   CALCIUM 9.7 10.4* 8.7*  CREATININE 1.00 1.02* 0.95  GFRNONAA 56* 54* 59*  GFRAA >60 >60 >60    LIVER FUNCTION TESTS: No results for input(s): BILITOT, AST, ALT, ALKPHOS, PROT, ALBUMIN in the last 8760 hours.  TUMOR MARKERS: No results for input(s): AFPTM, CEA, CA199, CHROMGRNA in the last 8760 hours.  Assessment and Plan:  Hx Left Subclavian artery pta/stent 10/2014 Noted new diminished pulse left wrist Rt hand numbness Doppler with atypical L SCA wave form Now scheduled for cerebral arteriogram Risks and Benefits discussed with the patient including, but not limited to bleeding, infection, vascular injury, contrast induced renal failure, stroke or even death. All of the patient's questions were answered, patient is agreeable to proceed. Consent signed and in chart.  Thank you for this interesting consult.  I greatly enjoyed meeting Susan Gillespie and look  forward to participating in their care.  A copy of this report was sent to the requesting provider on this  date.  Electronically Signed: Bradyn Vassey A 05/27/2015, 8:59 AM   I spent a total of  30 Minutes   in face to face in clinical consultation, greater than 50% of which was counseling/coordinating care for cerebral arteriogram

## 2015-05-27 NOTE — Sedation Documentation (Signed)
Pt will get 150mg  Plavix before leaving IR suite

## 2015-05-27 NOTE — Sedation Documentation (Signed)
Report to [facility] RN

## 2015-05-28 ENCOUNTER — Other Ambulatory Visit (HOSPITAL_COMMUNITY): Payer: Self-pay | Admitting: Interventional Radiology

## 2015-05-28 ENCOUNTER — Encounter (HOSPITAL_COMMUNITY): Payer: Self-pay | Admitting: *Deleted

## 2015-05-28 ENCOUNTER — Other Ambulatory Visit: Payer: Self-pay | Admitting: Radiology

## 2015-05-28 DIAGNOSIS — I771 Stricture of artery: Secondary | ICD-10-CM

## 2015-05-30 NOTE — Anesthesia Preprocedure Evaluation (Addendum)
Anesthesia Evaluation  Patient identified by MRN, date of birth, ID band Patient awake    Reviewed: Allergy & Precautions, NPO status , Patient's Chart, lab work & pertinent test results  History of Anesthesia Complications Negative for: history of anesthetic complications  Airway Mallampati: II  TM Distance: >3 FB Neck ROM: Full    Dental  (+) Teeth Intact, Dental Advisory Given   Pulmonary former smoker,    breath sounds clear to auscultation       Cardiovascular hypertension, + Peripheral Vascular Disease  + dysrhythmias Supra Ventricular Tachycardia  Rhythm:Regular Rate:Normal     Neuro/Psych CVA    GI/Hepatic Neg liver ROS, GERD  ,  Endo/Other  negative endocrine ROS  Renal/GU negative Renal ROS     Musculoskeletal   Abdominal   Peds  Hematology negative hematology ROS (+)   Anesthesia Other Findings   Reproductive/Obstetrics                           Anesthesia Physical Anesthesia Plan  ASA: III  Anesthesia Plan: General   Post-op Pain Management:    Induction: Intravenous  Airway Management Planned: Oral ETT  Additional Equipment: Arterial line  Intra-op Plan:   Post-operative Plan: Extubation in OR  Informed Consent: I have reviewed the patients History and Physical, chart, labs and discussed the procedure including the risks, benefits and alternatives for the proposed anesthesia with the patient or authorized representative who has indicated his/her understanding and acceptance.   Dental advisory given  Plan Discussed with: CRNA and Surgeon  Anesthesia Plan Comments:         Anesthesia Quick Evaluation

## 2015-05-31 ENCOUNTER — Encounter (HOSPITAL_COMMUNITY): Payer: Self-pay | Admitting: Anesthesiology

## 2015-05-31 ENCOUNTER — Inpatient Hospital Stay (HOSPITAL_COMMUNITY): Payer: PPO | Admitting: Anesthesiology

## 2015-05-31 ENCOUNTER — Ambulatory Visit (HOSPITAL_COMMUNITY): Payer: PPO

## 2015-05-31 ENCOUNTER — Inpatient Hospital Stay (HOSPITAL_COMMUNITY)
Admission: RE | Admit: 2015-05-31 | Discharge: 2015-06-01 | DRG: 254 | Disposition: A | Discharge status: Home / Self Care | Payer: PPO | Source: Ambulatory Visit | Attending: Interventional Radiology | Admitting: Interventional Radiology

## 2015-05-31 ENCOUNTER — Encounter (HOSPITAL_COMMUNITY): Payer: PPO

## 2015-05-31 ENCOUNTER — Encounter (HOSPITAL_COMMUNITY)
Admission: RE | Disposition: A | Discharge status: Home / Self Care | Payer: Self-pay | Source: Ambulatory Visit | Attending: Interventional Radiology

## 2015-05-31 ENCOUNTER — Encounter (HOSPITAL_COMMUNITY): Payer: Self-pay

## 2015-05-31 ENCOUNTER — Inpatient Hospital Stay (HOSPITAL_COMMUNITY)
Admission: RE | Admit: 2015-05-31 | Discharge: 2015-05-31 | Disposition: A | Discharge status: Home / Self Care | Payer: PPO | Source: Ambulatory Visit | Attending: Interventional Radiology | Admitting: Interventional Radiology

## 2015-05-31 DIAGNOSIS — Z79899 Other long term (current) drug therapy: Secondary | ICD-10-CM | POA: Diagnosis not present

## 2015-05-31 DIAGNOSIS — Z7982 Long term (current) use of aspirin: Secondary | ICD-10-CM

## 2015-05-31 DIAGNOSIS — E785 Hyperlipidemia, unspecified: Secondary | ICD-10-CM | POA: Diagnosis present

## 2015-05-31 DIAGNOSIS — I771 Stricture of artery: Secondary | ICD-10-CM | POA: Diagnosis present

## 2015-05-31 DIAGNOSIS — M1990 Unspecified osteoarthritis, unspecified site: Secondary | ICD-10-CM | POA: Diagnosis not present

## 2015-05-31 DIAGNOSIS — I658 Occlusion and stenosis of other precerebral arteries: Secondary | ICD-10-CM | POA: Diagnosis not present

## 2015-05-31 DIAGNOSIS — I70298 Other atherosclerosis of native arteries of extremities, other extremity: Secondary | ICD-10-CM | POA: Diagnosis not present

## 2015-05-31 DIAGNOSIS — Z981 Arthrodesis status: Secondary | ICD-10-CM | POA: Diagnosis not present

## 2015-05-31 DIAGNOSIS — Z85820 Personal history of malignant melanoma of skin: Secondary | ICD-10-CM

## 2015-05-31 DIAGNOSIS — Z86010 Personal history of colonic polyps: Secondary | ICD-10-CM

## 2015-05-31 DIAGNOSIS — K219 Gastro-esophageal reflux disease without esophagitis: Secondary | ICD-10-CM | POA: Diagnosis present

## 2015-05-31 DIAGNOSIS — K589 Irritable bowel syndrome without diarrhea: Secondary | ICD-10-CM | POA: Diagnosis not present

## 2015-05-31 DIAGNOSIS — Z823 Family history of stroke: Secondary | ICD-10-CM

## 2015-05-31 DIAGNOSIS — Z8249 Family history of ischemic heart disease and other diseases of the circulatory system: Secondary | ICD-10-CM

## 2015-05-31 DIAGNOSIS — I10 Essential (primary) hypertension: Secondary | ICD-10-CM | POA: Diagnosis not present

## 2015-05-31 DIAGNOSIS — T82858A Stenosis of vascular prosthetic devices, implants and grafts, initial encounter: Secondary | ICD-10-CM | POA: Diagnosis not present

## 2015-05-31 DIAGNOSIS — Z8673 Personal history of transient ischemic attack (TIA), and cerebral infarction without residual deficits: Secondary | ICD-10-CM

## 2015-05-31 DIAGNOSIS — Y838 Other surgical procedures as the cause of abnormal reaction of the patient, or of later complication, without mention of misadventure at the time of the procedure: Secondary | ICD-10-CM | POA: Diagnosis not present

## 2015-05-31 DIAGNOSIS — I6502 Occlusion and stenosis of left vertebral artery: Secondary | ICD-10-CM | POA: Diagnosis not present

## 2015-05-31 DIAGNOSIS — I708 Atherosclerosis of other arteries: Secondary | ICD-10-CM | POA: Diagnosis not present

## 2015-05-31 HISTORY — PX: RADIOLOGY WITH ANESTHESIA: SHX6223

## 2015-05-31 HISTORY — DX: Urinary tract infection, site not specified: N39.0

## 2015-05-31 LAB — PROTIME-INR
INR: 0.98 (ref 0.00–1.49)
PROTHROMBIN TIME: 13.2 s (ref 11.6–15.2)

## 2015-05-31 LAB — CBC WITH DIFFERENTIAL/PLATELET
BASOS ABS: 0 10*3/uL (ref 0.0–0.1)
Basophils Relative: 1 %
Eosinophils Absolute: 0.3 10*3/uL (ref 0.0–0.7)
Eosinophils Relative: 7 %
HEMATOCRIT: 32.5 % — AB (ref 36.0–46.0)
HEMOGLOBIN: 10.4 g/dL — AB (ref 12.0–15.0)
LYMPHS ABS: 1 10*3/uL (ref 0.7–4.0)
LYMPHS PCT: 23 %
MCH: 31.5 pg (ref 26.0–34.0)
MCHC: 32 g/dL (ref 30.0–36.0)
MCV: 98.5 fL (ref 78.0–100.0)
Monocytes Absolute: 0.6 10*3/uL (ref 0.1–1.0)
Monocytes Relative: 13 %
NEUTROS ABS: 2.5 10*3/uL (ref 1.7–7.7)
NEUTROS PCT: 56 %
Platelets: 201 10*3/uL (ref 150–400)
RBC: 3.3 MIL/uL — AB (ref 3.87–5.11)
RDW: 13.3 % (ref 11.5–15.5)
WBC: 4.4 10*3/uL (ref 4.0–10.5)

## 2015-05-31 LAB — COMPREHENSIVE METABOLIC PANEL
ALK PHOS: 58 U/L (ref 38–126)
ALT: 26 U/L (ref 14–54)
AST: 26 U/L (ref 15–41)
Albumin: 3.2 g/dL — ABNORMAL LOW (ref 3.5–5.0)
Anion gap: 12 (ref 5–15)
BUN: 18 mg/dL (ref 6–20)
CALCIUM: 9.3 mg/dL (ref 8.9–10.3)
CHLORIDE: 105 mmol/L (ref 101–111)
CO2: 23 mmol/L (ref 22–32)
CREATININE: 1.03 mg/dL — AB (ref 0.44–1.00)
GFR, EST NON AFRICAN AMERICAN: 53 mL/min — AB (ref >60)
Glucose, Bld: 107 mg/dL — ABNORMAL HIGH (ref 65–99)
Potassium: 4.1 mmol/L (ref 3.5–5.1)
Sodium: 140 mmol/L (ref 135–145)
Total Bilirubin: 0.4 mg/dL (ref 0.3–1.2)
Total Protein: 5.3 g/dL — ABNORMAL LOW (ref 6.5–8.1)

## 2015-05-31 LAB — APTT: APTT: 30 s (ref 24–37)

## 2015-05-31 LAB — POCT ACTIVATED CLOTTING TIME
Activated Clotting Time: 193 seconds
Activated Clotting Time: 193 seconds

## 2015-05-31 LAB — PLATELET INHIBITION P2Y12: PLATELET FUNCTION P2Y12: 199 [PRU] (ref 194–418)

## 2015-05-31 LAB — HEPARIN LEVEL (UNFRACTIONATED): Heparin Unfractionated: 0.1 IU/mL — ABNORMAL LOW (ref 0.30–0.70)

## 2015-05-31 SURGERY — RADIOLOGY WITH ANESTHESIA
Anesthesia: General

## 2015-05-31 MED ORDER — FENTANYL CITRATE (PF) 100 MCG/2ML IJ SOLN
INTRAMUSCULAR | Status: DC | PRN
Start: 2015-05-31 — End: 2015-05-31
  Administered 2015-05-31: 25 ug via INTRAVENOUS

## 2015-05-31 MED ORDER — SODIUM CHLORIDE 0.9 % IV SOLN
Freq: Once | INTRAVENOUS | Status: AC
  Administered 2015-05-31: 09:00:00 via INTRAVENOUS

## 2015-05-31 MED ORDER — LIDOCAINE HCL 1 % IJ SOLN
INTRAMUSCULAR | Status: AC
  Filled 2015-05-31: qty 20

## 2015-05-31 MED ORDER — ACETAMINOPHEN 650 MG RE SUPP: 650.0000 mg | Freq: Four times a day (QID) | RECTAL | Status: DC | PRN

## 2015-05-31 MED ORDER — HEPARIN (PORCINE) IN NACL 100-0.45 UNIT/ML-% IJ SOLN
INTRAMUSCULAR | Status: AC
Start: 2015-05-31 — End: 2015-05-31
  Filled 2015-05-31: qty 250

## 2015-05-31 MED ORDER — CLOPIDOGREL BISULFATE 75 MG PO TABS
75.0000 mg | ORAL_TABLET | Freq: Every day | ORAL | Status: DC
  Administered 2015-06-01: 75 mg via ORAL
  Filled 2015-05-31: qty 1

## 2015-05-31 MED ORDER — LACTATED RINGERS IV SOLN
INTRAVENOUS | Status: DC
  Administered 2015-05-31: 07:00:00 via INTRAVENOUS

## 2015-05-31 MED ORDER — ASPIRIN EC 325 MG PO TBEC
325.0000 mg | DELAYED_RELEASE_TABLET | ORAL | Status: DC
  Filled 2015-05-31: qty 1

## 2015-05-31 MED ORDER — HEPARIN SODIUM (PORCINE) 1000 UNIT/ML IJ SOLN
INTRAMUSCULAR | Status: DC | PRN
  Administered 2015-05-31: 3000 [IU] via INTRAVENOUS

## 2015-05-31 MED ORDER — IOHEXOL 300 MG/ML  SOLN
150.0000 mL | Freq: Once | INTRAMUSCULAR | Status: AC | PRN
  Administered 2015-05-31: 90 mL via INTRAVENOUS

## 2015-05-31 MED ORDER — CLOPIDOGREL BISULFATE 75 MG PO TABS: 150.0000 mg | ORAL_TABLET | Freq: Once | ORAL | Status: DC

## 2015-05-31 MED ORDER — SODIUM CHLORIDE 0.9 % IV SOLN
INTRAVENOUS | Status: DC
  Administered 2015-05-31: 13:00:00 via INTRAVENOUS

## 2015-05-31 MED ORDER — MIDAZOLAM HCL 5 MG/5ML IJ SOLN
INTRAMUSCULAR | Status: DC | PRN
  Administered 2015-05-31: 1 mg via INTRAVENOUS

## 2015-05-31 MED ORDER — NICARDIPINE HCL IN NACL 20-0.86 MG/200ML-% IV SOLN
5.0000 mg/h | INTRAVENOUS | Status: DC
  Administered 2015-05-31: 5 mg/h via INTRAVENOUS
  Filled 2015-05-31: qty 200

## 2015-05-31 MED ORDER — NIMODIPINE 30 MG PO CAPS
0.0000 mg | ORAL_CAPSULE | ORAL | Status: DC
  Filled 2015-05-31: qty 2

## 2015-05-31 MED ORDER — ONDANSETRON HCL 4 MG/2ML IJ SOLN: 4.0000 mg | Freq: Four times a day (QID) | INTRAMUSCULAR | Status: DC | PRN

## 2015-05-31 MED ORDER — HYDROMORPHONE HCL 1 MG/ML IJ SOLN: 0.2500 mg | INTRAMUSCULAR | Status: DC | PRN

## 2015-05-31 MED ORDER — CEFAZOLIN SODIUM-DEXTROSE 2-3 GM-% IV SOLR
INTRAVENOUS | Status: AC
  Filled 2015-05-31: qty 50

## 2015-05-31 MED ORDER — ASPIRIN 325 MG PO TABS
325.0000 mg | ORAL_TABLET | Freq: Every day | ORAL | Status: DC
  Administered 2015-06-01: 325 mg via ORAL
  Filled 2015-05-31: qty 1

## 2015-05-31 MED ORDER — ACETAMINOPHEN 500 MG PO TABS
1000.0000 mg | ORAL_TABLET | Freq: Four times a day (QID) | ORAL | Status: DC | PRN
  Administered 2015-05-31: 1000 mg via ORAL
  Filled 2015-05-31: qty 2

## 2015-05-31 MED ORDER — PROMETHAZINE HCL 25 MG/ML IJ SOLN: 6.2500 mg | INTRAMUSCULAR | Status: DC | PRN

## 2015-05-31 MED ORDER — HEPARIN (PORCINE) IN NACL 100-0.45 UNIT/ML-% IJ SOLN
600.0000 [IU]/h | INTRAMUSCULAR | Status: DC
  Administered 2015-05-31: 500 [IU]/h via INTRAVENOUS
  Filled 2015-05-31: qty 250

## 2015-05-31 MED ORDER — CEFAZOLIN SODIUM-DEXTROSE 2-3 GM-% IV SOLR
2.0000 g | INTRAVENOUS | Status: AC
  Administered 2015-05-31: 2 g via INTRAVENOUS
  Filled 2015-05-31: qty 50

## 2015-05-31 MED ORDER — IOHEXOL 300 MG/ML  SOLN
50.0000 mL | Freq: Once | INTRAMUSCULAR | Status: AC | PRN
  Administered 2015-05-31: 12 mL via INTRAVENOUS

## 2015-05-31 MED ORDER — ONDANSETRON HCL 4 MG/2ML IJ SOLN
INTRAMUSCULAR | Status: DC | PRN
  Administered 2015-05-31: 4 mg via INTRAVENOUS

## 2015-05-31 MED ORDER — CLOPIDOGREL BISULFATE 75 MG PO TABS
75.0000 mg | ORAL_TABLET | ORAL | Status: AC
  Administered 2015-05-31: 150 mg via ORAL
  Filled 2015-05-31 (×2): qty 1

## 2015-05-31 NOTE — Transfer of Care (Signed)
Immediate Anesthesia Transfer of Care Note  Patient: Susan Gillespie  Procedure(s) Performed: Procedure(s): RADIOLOGY WITH ANESTHESIA (N/A)  Patient Location: PACU  Anesthesia Type:MAC  Level of Consciousness: awake, alert , oriented and patient cooperative  Airway & Oxygen Therapy: Patient Spontanous Breathing and Patient connected to nasal cannula oxygen  Post-op Assessment: Report given to RN and Post -op Vital signs reviewed and stable  Post vital signs: Reviewed  Last Vitals:  Filed Vitals:   05/31/15 0711  BP: [id number]    Complications: No apparent anesthesia complications

## 2015-05-31 NOTE — Progress Notes (Signed)
Referring Physician(s): Sethi  Chief Complaint:  Left subclavian artery intrastent stenosis  Subjective:  L SCA intrastent angioplasty/stent 2/13 in IR with Dr Estanislado Pandy Procedure without complication Eating liquids No N/V Denies headache No visual or speech changes resting  Allergies: Daypro; Lisinopril; Other; and Simvastatin  Medications: Prior to Admission medications   Medication Sig Start Date End Date Taking? Authorizing Provider  acetaminophen (TYLENOL) 650 MG CR tablet Take 1,300 mg by mouth every 8 (eight) hours as needed for pain.    Yes Historical Provider, MD  ALPRAZolam (XANAX) 0.5 MG tablet TAKE 1 TABLET BY MOUTH 3 TIMES A DAY AS NEEDED FOR SLEEP Patient taking differently: 0.25   Yes Janith Lima, MD  aspirin 325 MG tablet Take 1 tablet (325 mg total) by mouth daily. 11/10/14  Yes Hedy Jacob, PA-C  Calcium Carbonate (CALCIUM 500 PO) Take 1 tablet by mouth 2 (two) times daily.   Yes Historical Provider, MD  celecoxib (CELEBREX) 200 MG capsule Take 200 mg by mouth daily as needed for mild pain.   Yes Historical Provider, MD  cetirizine (ZYRTEC) 10 MG tablet Take 10 mg by mouth every morning.    Yes Historical Provider, MD  Cholecalciferol (VITAMIN D3) 2000 UNITS TABS Take 1 tablet by mouth every morning.    Yes Historical Provider, MD  clopidogrel (PLAVIX) 75 MG tablet Take 75 mg by mouth daily.   Yes Historical Provider, MD  cycloSPORINE (RESTASIS) 0.05 % ophthalmic emulsion Place 1 drop into both eyes 2 (two) times daily.   Yes Historical Provider, MD  estradiol (VIVELLE-DOT) 0.05 MG/24HR patch Place 1 patch onto the skin 2 (two) times a week. 04/21/15  Yes Historical Provider, MD  FERROUS SULFATE PO Take by mouth daily.   Yes Historical Provider, MD  fluticasone (FLONASE) 50 MCG/ACT nasal spray Place 1 spray into both nostrils daily.   Yes Historical Provider, MD  gabapentin (NEURONTIN) 300 MG capsule Take 300 mg by mouth 2 (two) times daily.    Yes  Historical Provider, MD  Loperamide HCl (IMODIUM PO) Take 1 tablet by mouth daily.    Yes Historical Provider, MD  losartan (COZAAR) 50 MG tablet Take 50 mg by mouth 2 (two) times daily. 11/15/14  Yes Historical Provider, MD  Magnesium 250 MG TABS Take 1 tablet by mouth daily.   Yes Historical Provider, MD  omeprazole (PRILOSEC) 40 MG capsule Take 40 mg by mouth daily. 04/21/15  Yes Historical Provider, MD  potassium chloride (KLOR-CON 10) 10 MEQ tablet Take 1 tablet (10 mEq total) by mouth 2 (two) times daily. Patient taking differently: Take 10 mEq by mouth daily.  09/15/13  Yes Janith Lima, MD  rosuvastatin (CRESTOR) 20 MG tablet Take 1 tablet (20 mg total) by mouth daily. 09/16/13  Yes Janith Lima, MD  traZODone (DESYREL) 100 MG tablet Take 100 mg by mouth at bedtime.   Yes Historical Provider, MD  VESICARE 5 MG tablet Take 5 mg by mouth daily. 04/22/15  Yes Historical Provider, MD  metoprolol (LOPRESSOR) 50 MG tablet Take 1 tablet (50 mg total) by mouth as needed. 09/15/13   Janith Lima, MD  SUMAtriptan (IMITREX) 50 MG tablet Take 50 mg by mouth every 2 (two) hours as needed for migraine. May repeat in 2 hours if headache persists or recurs.    Historical Provider, MD     Vital Signs: BP 130/65 mmHg  Pulse 67  Temp(Src) 97.7 F (36.5 C)  Resp 17  Ht  5\' 3"  (1.6 m)  Wt 163 lb 2.3 oz (74 kg)  BMI 28.91 kg/m2  SpO2 100%  Physical Exam  Constitutional: She is oriented to person, place, and time. She appears well-nourished.  HENT:  Smile = Puffs cheeks= Face symmetrical  Eyes: EOM are normal.  Cardiovascular: Normal rate and regular rhythm.   Pulmonary/Chest: Effort normal and breath sounds normal.  Abdominal: Soft. Bowel sounds are normal.  Musculoskeletal: Normal range of motion.  Rt groin NT; no bleeding No hematoma Rt foot 2+ pulses  Neurological: She is alert and oriented to person, place, and time.  Skin: Skin is warm.  Psychiatric: She has a normal mood and affect. Her  behavior is normal. Judgment and thought content normal.  Nursing note and vitals reviewed.   Imaging: No results found.  Labs:  CBC:  Recent Labs  11/10/14 0220 05/21/15 1156 05/27/15 0830 05/31/15 0647  WBC 5.7 6.4 5.2 4.4  HGB 10.3* 11.9* 12.5 10.4*  HCT 30.5* 34.9* 37.2 32.5*  PLT 172 255 235 201    COAGS:  Recent Labs  10/15/14 0731 11/02/14 1348 05/27/15 0830 05/31/15 0647  INR 0.99 0.97 0.98 0.98  APTT 29 29 30 30     BMP:  Recent Labs  11/02/14 1348 11/10/14 0220 05/27/15 0830 05/31/15 0647  NA 141 137 140 140  K 4.7 4.3 4.3 4.1  CL 105 107 103 105  CO2 29 24 28 23   GLUCOSE 109* 113* 104* 107*  BUN 11 16 14 18   CALCIUM 10.4* 8.7* 10.0 9.3  CREATININE 1.02* 0.95 1.13* 1.03*  GFRNONAA 54* 59* 48* 53*  GFRAA >60 >60 55* >60    LIVER FUNCTION TESTS:  Recent Labs  05/31/15 0647  BILITOT 0.4  AST 26  ALT 26  ALKPHOS 58  PROT 5.3*  ALBUMIN 3.2*    Assessment and Plan:  Left subclavian artery intrastent stenosis Pta/stent 2/13 in IR Doing well No complaints Plan for prob dc in am willl report to Dr Estanislado Pandy  Electronically Signed: Monia Sabal A 05/31/2015, 3:53 PM   I spent a total of 15 Minutes at the the patient's bedside AND on the patient's hospital floor or unit, greater than 50% of which was counseling/coordinating care for L SCA intrastent pta/stent

## 2015-05-31 NOTE — Progress Notes (Signed)
Bp 109/57 [place] held

## 2015-05-31 NOTE — Care Management Note (Signed)
Case Management Note  Patient Details  Name: ZAHYRA BLITZ MRN: [id number] Date of Birth: Oct 13, 1945  Subjective/Objective:   Pt admitted on 05/31/15 s/p Lt subclavian artery stent/angioplasty.  PTA, pt independent of ADLS.                   Action/Plan: Will follow for discharge planning as pt progresses.    Expected Discharge Date:                  Expected Discharge Plan:  Home/Self Care  In-House Referral:     Discharge planning Services  CM Consult  Post Acute Care Choice:    Choice offered to:     DME Arranged:    DME Agency:     HH Arranged:    HH Agency:     Status of Service:  In process, will continue to follow  Medicare Important Message Given:    Date Medicare IM Given:    Medicare IM give by:    Date Additional Medicare IM Given:    Additional Medicare Important Message give by:     If discussed at [place] of Stay Meetings, dates discussed:    Additional Comments:  Reinaldo Raddle, RN, BSN  Trauma/Neuro ICU Case Manager [phone]

## 2015-05-31 NOTE — Procedures (Signed)
S/P Lt subclavian artery stent assisted angioplasty for severe symptomatic stenosis

## 2015-05-31 NOTE — Progress Notes (Signed)
Jannifer Franklin, PA gave a verbal order that the patient can sit up 5 hours post-sheath pull which would be 1630.  At 1500, new blood noted at the groin site.  Pressure was held and bleeding stopped.  Jannifer Franklin was notified and said pt needed to remain flat until 1800.

## 2015-05-31 NOTE — Anesthesia Postprocedure Evaluation (Signed)
Anesthesia Post Note  Patient: Jodi Mourning [place]  Procedure(s) Performed: Procedure(s) (LRB): RADIOLOGY WITH ANESTHESIA (N/A)  Patient location during evaluation: PACU Anesthesia Type: MAC Level of consciousness: awake and alert Pain management: pain level controlled Vital Signs Assessment: post-procedure vital signs reviewed and stable Respiratory status: spontaneous breathing, nonlabored ventilation, respiratory function stable and patient connected to nasal cannula oxygen Cardiovascular status: stable and blood pressure returned to baseline Anesthetic complications: no    Last Vitals:  Filed Vitals:   05/31/15 1212 05/31/15 1225  BP: 100/49 104/57  Pulse: 62 64  Temp:  36.5 C  Resp: 17 12    Last Pain: There were no vitals filed for this visit.               Dinnis Rog,JAMES TERRILL

## 2015-05-31 NOTE — H&P (Signed)
Chief Complaint: Patient was seen in consultation today for cerebral arteriogram with Left subclavian artery intrastent angioplasty/stent at the request of Deveshwar,Sanjeev  Referring Physician(s): Dr Leonie Man  History of Present Illness: Susan Gillespie is a 70 y.o. female   Pt known to NIR Hx Left subclavian artery stenosis with angioplasty/stent 10/2014 Developed symptoms of diminished pulse in left wrist and Rt hand numbness 02/2015 Doppler studies did reveal stenosis  VASCULAR LAB PRELIMINARY PRELIMINARY PRELIMINARY PRELIMINARY  Carotid duplex completed.  Preliminary report: Right: 1-39% ICA stenosis (high end of scale). Left: 40-59% internal carotid artery stenosis. Right: Vertebral artery flow is antegrade. Left: Vertebral artery is bidirectional and the subclavian artery waveform is atypical which may suggest restenosis of the left subclavian stent.  Arteriogram 05/27/2015 :   Procedures   1. CEREBRAL ANGIOGRAM [id number] (Custom)]       Expand All Collapse All   S/P bilateral common carotid and RT Vertebral arteriogram,and Lt subclavian arteriogram. RT CFa approach. Findings. 1.LT subclavian steal due to severe Lt subclavian INTRASTENT stenosis of > 95 %     Now scheduled for L SCA  intrastent angioplasty and possible stent placement  Past Medical History  Diagnosis Date  . Colon polyp   . Headache(784.0)   . Hyperlipidemia   . Arthritis   . Family history of cardiovascular disease   . HTN (hypertension)   . IBS (irritable bowel syndrome)     followed by Dr. [name]  . Hypertension   . IBS (irritable bowel syndrome)   . GERD (gastroesophageal reflux disease)   . Bleeding tendency ([place]) 05/16/2012    Hx post op bleeding (epidural hematoma s/p ACDF '10; LLQ hematoma due to superficial vessel fascial bleed post colon resection; Normal platelet count; saw Dr. [name] '14  . Melanoma ([place])     removed from back 04/2013  . Dysrhythmia     SVT    . SVT (supraventricular tachycardia) ([place])   . Anxiety   . Urinary urgency   . Liver cyst   . Diverticulitis   . Fracture of right fibula   . Stroke ([place]) 06/2012    mild  . Shortness of breath dyspnea     with exertion  . UTI (lower urinary tract infection)     x 2 since June 2016    Past Surgical History  Procedure Laterality Date  . Spine surgery      cervical fusion, returned to OR for post op  bleeding  . Colon surgery      Sigmoid Colectomy, returned for post op bleeding  . Abdominal hysterectomy    . Tonsillectomy    . Cholecystectomy    . Appendectomy    . Skin cancer excised  04/2013    Melonoma  . Tonsillectomy    . Breast surgery      BIL breast augmentation  . Vagina surgery      anterior posterior repair  . Joint replacement Left     thumb  . Eye surgery      eyelid drooping fixed  . Tubal ligation    . Radiology with anesthesia N/A 11/09/2014    Procedure: STENT PLACEMENT;  Surgeon: Luanne Bras, MD;  Location: [facility];  Service: Radiology;  Laterality: N/A;  . Mastoidectomy revision    . Mastoid debridement    . Colonoscopy w/ polypectomy      Allergies: Daypro; Lisinopril; Other; and Simvastatin  Medications: Prior to Admission medications   Medication Sig Start Date End Date  Taking? Authorizing Provider  acetaminophen (TYLENOL) 650 MG CR tablet Take 1,300 mg by mouth every 8 (eight) hours as needed for pain.     Historical Provider, MD  ALPRAZolam (XANAX) 0.5 MG tablet TAKE 1 TABLET BY MOUTH 3 TIMES A DAY AS NEEDED FOR SLEEP Patient taking differently: 0.25    Janith Lima, MD  aspirin 325 MG tablet Take 1 tablet (325 mg total) by mouth daily. 11/10/14   Hedy Jacob, PA-C  Calcium Carbonate (CALCIUM 500 PO) Take 1 tablet by mouth 2 (two) times daily.    Historical Provider, MD  celecoxib (CELEBREX) 200 MG capsule Take 200 mg by mouth daily as needed for mild pain.    Historical Provider, MD  cetirizine (ZYRTEC) 10 MG tablet Take 10 mg by  mouth every morning.     Historical Provider, MD  Cholecalciferol (VITAMIN D3) 2000 UNITS TABS Take 1 tablet by mouth every morning.     Historical Provider, MD  clopidogrel (PLAVIX) 75 MG tablet Take 75 mg by mouth daily.    Historical Provider, MD  cycloSPORINE (RESTASIS) 0.05 % ophthalmic emulsion Place 1 drop into both eyes 2 (two) times daily.    Historical Provider, MD  estradiol (VIVELLE-DOT) 0.05 MG/24HR patch Place 1 patch onto the skin 2 (two) times a week. 04/21/15   Historical Provider, MD  FERROUS SULFATE PO Take by mouth daily.    Historical Provider, MD  fluticasone (FLONASE) 50 MCG/ACT nasal spray Place 1 spray into both nostrils daily.    Historical Provider, MD  gabapentin (NEURONTIN) 300 MG capsule Take 300 mg by mouth 2 (two) times daily.     Historical Provider, MD  Loperamide HCl (IMODIUM PO) Take 1 tablet by mouth daily.     Historical Provider, MD  losartan (COZAAR) 50 MG tablet Take 50 mg by mouth 2 (two) times daily. 11/15/14   Historical Provider, MD  Magnesium 250 MG TABS Take 1 tablet by mouth daily.    Historical Provider, MD  metoprolol (LOPRESSOR) 50 MG tablet Take 1 tablet (50 mg total) by mouth as needed. 09/15/13   Janith Lima, MD  omeprazole (PRILOSEC) 40 MG capsule Take 40 mg by mouth daily. 04/21/15   Historical Provider, MD  potassium chloride (KLOR-CON 10) 10 MEQ tablet Take 1 tablet (10 mEq total) by mouth 2 (two) times daily. Patient taking differently: Take 10 mEq by mouth daily.  09/15/13   Janith Lima, MD  rosuvastatin (CRESTOR) 20 MG tablet Take 1 tablet (20 mg total) by mouth daily. 09/16/13   Janith Lima, MD  SUMAtriptan (IMITREX) 50 MG tablet Take 50 mg by mouth every 2 (two) hours as needed for migraine. May repeat in 2 hours if headache persists or recurs.    Historical Provider, MD  traZODone (DESYREL) 100 MG tablet Take 100 mg by mouth at bedtime.    Historical Provider, MD  VESICARE 5 MG tablet Take 5 mg by mouth daily. 04/22/15   Historical  Provider, MD     Family History  Problem Relation Age of Onset  . Hyperlipidemia Other   . Hypertension Other   . Cancer Other     lung, esophagus, stomach  . Stroke Other   . Heart disease Father   . Heart disease Sister   . Heart attack Sister   . Heart disease Brother   . Heart attack Brother   . Stroke Son     Social History   Social History  . Marital  Status: Divorced    Spouse Name: N/A  . Number of Children: 2  . Years of Education: 14   Occupational History  . 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 Smoking status: Former Smoker -- 2.00 packs/day for 22 years    Types: Cigarettes    Quit date: 04/23/1985  . Smokeless tobacco: Never Used  . Alcohol Use: 2.4 oz/week    4 Glasses of wine per week     Comment: 1 drink a  day  . Drug Use: No  . Sexual Activity: Yes    Birth Control/ Protection: Surgical   Other Topics Concern  . None   Social History Narrative   ** Merged History Encounter **       Domestic partner   Regular exercise-no   Right handed   Caffeine use-- 2 cups coffee daily, rare soda     Review of Systems: A 12 point ROS discussed and pertinent positives are indicated in the HPI above.  All other systems are negative.  Review of Systems  Constitutional: Negative for fever, activity change, appetite change and fatigue.  HENT: Negative for tinnitus and trouble swallowing.   Eyes: Negative for visual disturbance.  Respiratory: Negative for shortness of breath.   Gastrointestinal: Negative for nausea.  Musculoskeletal: Negative for back pain.  Neurological: Positive for weakness and numbness. Negative for dizziness, tremors, seizures, syncope, facial asymmetry, light-headedness and headaches.  Psychiatric/Behavioral: Negative for behavioral problems and confusion.    Vital Signs: There were no vitals taken for this visit.  Physical Exam  Constitutional: She is oriented to person, place, and time.  She appears well-nourished.  HENT:  Head: Atraumatic.  Eyes: EOM are normal.  Neck: Normal range of motion.  Cardiovascular: Normal rate, regular rhythm and normal heart sounds.   No murmur heard. Pulmonary/Chest: Effort normal and breath sounds normal. She has no wheezes.  Abdominal: Soft. There is no tenderness.  Musculoskeletal: Normal range of motion.  Neurological: She is alert and oriented to person, place, and time.  Skin: Skin is dry.  Psychiatric: She has a normal mood and affect. Her behavior is normal. Judgment and thought content normal.  Nursing note and vitals reviewed.   Mallampati Score:  MD Evaluation Airway: WNL Heart: WNL Abdomen: WNL Chest/ Lungs: WNL ASA  Classification: 2 Mallampati/Airway Score: Two  Imaging: No results found.  Labs:  CBC:  Recent Labs  11/10/14 0220 05/21/15 1156 05/27/15 0830 05/31/15 0647  WBC 5.7 6.4 5.2 4.4  HGB 10.3* 11.9* 12.5 10.4*  HCT 30.5* 34.9* 37.2 32.5*  PLT 172 255 235 201    COAGS:  Recent Labs  10/15/14 0731 11/02/14 1348 05/27/15 0830 05/31/15 0647  INR 0.99 0.97 0.98 0.98  APTT 29 29 30 30     BMP:  Recent Labs  10/15/14 0731 11/02/14 1348 11/10/14 0220 05/27/15 0830  NA 137 141 137 140  K 4.3 4.7 4.3 4.3  CL 101 105 107 103  CO2 29 29 24 28   GLUCOSE 107* 109* 113* 104*  BUN 10 11 16 14   CALCIUM 9.7 10.4* 8.7* 10.0  CREATININE 1.00 1.02* 0.95 1.13*  GFRNONAA 56* 54* 59* 48*  GFRAA >60 >60 >60 55*    LIVER FUNCTION TESTS: No results for input(s): BILITOT, AST, ALT, ALKPHOS, PROT, ALBUMIN in the last 8760 hours.  TUMOR MARKERS: No results for input(s): AFPTM, CEA, CA199, CHROMGRNA in the last 8760 hours.  Assessment and Plan:  Left subclavian artery angioplasty/stent 10/2014 New intrastent stenosis per 05/27/15 arteriogram Scheduled for cerebral arteriogram with L SCA intrastent angioplasty/possible stent placement Risks and Benefits discussed with the patient including, but  not limited to bleeding, infection, vascular injury, contrast induced renal failure, stroke or even death. All of the patient's questions were answered, patient is agreeable to proceed. Consent signed and in chart.  Pt aware if intervention is performed, she will be admitted to Neuro ICU after procedure overnight. Plan for discharge in am Agreeable   Thank you for this interesting consult.  I greatly enjoyed meeting Susan Gillespie and look forward to participating in their care.  A copy of this report was sent to the requesting provider on this date.  Electronically Signed: Kamaryn Grimley A 05/31/2015, 7:40 AM   I spent a total of  30 Minutes   in face to face in clinical consultation, greater than 50% of which was counseling/coordinating care for L SCA stenosis

## 2015-05-31 NOTE — Progress Notes (Signed)
ANTICOAGULATION CONSULT NOTE - Follow Up Consult  Pharmacy Consult for Heparin Indication: post stent assisted angioplasty  Allergies  Allergen Reactions  . Daypro [Oxaprozin] Swelling    facial  . Lisinopril     cough  . Other Itching    narcotics  . Simvastatin     REACTION: myalgias and fatigue    Patient Measurements: Height: 5\' 3"  (160 cm) Weight: 163 lb 2.3 oz (74 kg) IBW/kg (Calculated) : 52.4 Heparin Dosing Weight: 68 kg  Vital Signs: Temp: 97.7 F (36.5 C) (02/13 1600) Temp Source: Oral (02/13 1600) BP: 123/49 mmHg (02/13 1600) Pulse Rate: 68 (02/13 1600)  Labs:  Recent Labs  05/31/15 0647 05/31/15 1603  HGB 10.4*  --   HCT 32.5*  --   PLT 201  --   APTT 30  --   LABPROT 13.2  --   INR 0.98  --   HEPARINUNFRC  --  0.10*  CREATININE 1.03*  --     Estimated Creatinine Clearance: 48.2 mL/min (by C-G formula based on Cr of 1.03).   Medical History: Past Medical History  Diagnosis Date  . Colon polyp   . Headache(784.0)   . Hyperlipidemia   . Arthritis   . Family history of cardiovascular disease   . HTN (hypertension)   . IBS (irritable bowel syndrome)     followed by Dr. [name]  . Hypertension   . IBS (irritable bowel syndrome)   . GERD (gastroesophageal reflux disease)   . Bleeding tendency ([place]) 05/16/2012    Hx post op bleeding (epidural hematoma s/p ACDF '10; LLQ hematoma due to superficial vessel fascial bleed post colon resection; Normal platelet count; saw Dr. [name] '14  . Melanoma ([place])     removed from back 04/2013  . Dysrhythmia     SVT  . SVT (supraventricular tachycardia) ([place])   . Anxiety   . Urinary urgency   . Liver cyst   . Diverticulitis   . Fracture of right fibula   . Stroke ([place]) 06/2012    mild  . Shortness of breath dyspnea     with exertion  . UTI (lower urinary tract infection)     x 2 since June 2016    Medications:  Prescriptions prior to admission  Medication Sig Dispense Refill Last Dose  .  acetaminophen (TYLENOL) 650 MG CR tablet Take 1,300 mg by mouth every 8 (eight) hours as needed for pain.    Past Week at Unknown time  . ALPRAZolam (XANAX) 0.5 MG tablet TAKE 1 TABLET BY MOUTH 3 TIMES A DAY AS NEEDED FOR SLEEP (Patient taking differently: 0.25) 65 tablet 2 05/31/2015 at 0415  . aspirin 325 MG tablet Take 1 tablet (325 mg total) by mouth daily. 30 tablet 0 05/31/2015 at 0415  . Calcium Carbonate (CALCIUM 500 PO) Take 1 tablet by mouth 2 (two) times daily.   05/30/2015 at Unknown time  . celecoxib (CELEBREX) 200 MG capsule Take 200 mg by mouth daily as needed for mild pain.   05/30/2015 at Unknown time  . cetirizine (ZYRTEC) 10 MG tablet Take 10 mg by mouth every morning.    05/30/2015 at Unknown time  . Cholecalciferol (VITAMIN D3) 2000 UNITS TABS Take 1 tablet by mouth every morning.    05/30/2015 at Unknown time  . clopidogrel (PLAVIX) 75 MG tablet Take 75 mg by mouth daily.   05/31/2015 at 0415  . cycloSPORINE (RESTASIS) 0.05 % ophthalmic emulsion Place 1 drop into both eyes 2 (two)  times daily.   Past Month at Unknown time  . estradiol (VIVELLE-DOT) 0.05 MG/24HR patch Place 1 patch onto the skin 2 (two) times a week.  3 05/31/2015 at Unknown time  . FERROUS SULFATE PO Take by mouth daily.   05/30/2015 at Unknown time  . fluticasone (FLONASE) 50 MCG/ACT nasal spray Place 1 spray into both nostrils daily.   Past Month at Unknown time  . gabapentin (NEURONTIN) 300 MG capsule Take 300 mg by mouth 2 (two) times daily.    05/30/2015 at Unknown time  . Loperamide HCl (IMODIUM PO) Take 1 tablet by mouth daily.    05/31/2015 at 0415  . losartan (COZAAR) 50 MG tablet Take 50 mg by mouth 2 (two) times daily.  3 05/30/2015 at Unknown time  . Magnesium 250 MG TABS Take 1 tablet by mouth daily.   05/30/2015 at Unknown time  . omeprazole (PRILOSEC) 40 MG capsule Take 40 mg by mouth daily.  3 05/30/2015 at Unknown time  . potassium chloride (KLOR-CON 10) 10 MEQ tablet Take 1 tablet (10 mEq total) by mouth 2  (two) times daily. (Patient taking differently: Take 10 mEq by mouth daily. ) 180 tablet 3 05/30/2015 at Unknown time  . rosuvastatin (CRESTOR) 20 MG tablet Take 1 tablet (20 mg total) by mouth daily. 90 tablet 3 05/30/2015 at Unknown time  . traZODone (DESYREL) 100 MG tablet Take 100 mg by mouth at bedtime.   05/30/2015 at Unknown time  . VESICARE 5 MG tablet Take 5 mg by mouth daily.  3 05/30/2015 at Unknown time  . metoprolol (LOPRESSOR) 50 MG tablet Take 1 tablet (50 mg total) by mouth as needed. 90 tablet 3 More than a month at Unknown time  . SUMAtriptan (IMITREX) 50 MG tablet Take 50 mg by mouth every 2 (two) hours as needed for migraine. May repeat in 2 hours if headache persists or recurs.   More than a month at Unknown time   Scheduled:  . [START ON 06/01/2015] aspirin  325 mg Oral Q breakfast  . ceFAZolin      . [START ON 06/01/2015] clopidogrel  75 mg Oral Q breakfast  . heparin       Infusions:  . sodium chloride 75 mL/hr at 05/31/15 1600  . heparin 500 Units/hr (05/31/15 1600)  . lactated ringers 10 mL/hr at 05/31/15 0724  . niCARDipine      Assessment: 70yo female to start heparin for post stent assisted angioplasty.  Heparin drip 500 uts/hr HL 0.1 at goal.    Goal of Therapy:  Heparin level 0.1-0.25 units/ml Monitor platelets by anticoagulation protocol: Yes    Plan:  Continue Heparin drip 500 uts/hr Stop in am  Bonnita Nasuti Pharm.D. CPP, BCPS Clinical Pharmacist [phone] 05/31/2015 5:34 PM

## 2015-05-31 NOTE — Progress Notes (Signed)
ANTICOAGULATION CONSULT NOTE - Initial Consult  Pharmacy Consult for Heparin Indication: post stent assisted angioplasty  Allergies  Allergen Reactions  . Daypro [Oxaprozin] Swelling    facial  . Lisinopril     cough  . Other Itching    narcotics  . Simvastatin     REACTION: myalgias and fatigue    Patient Measurements: Height: 5\' 3"  (160 cm) Weight: 163 lb (73.936 kg) IBW/kg (Calculated) : 52.4 Heparin Dosing Weight: 68 kg  Vital Signs: Temp: 97.5 F (36.4 C) (02/13 1125) BP: 108/48 mmHg (02/13 1125) Pulse Rate: 61 (02/13 1125)  Labs:  Recent Labs  05/31/15 0647  HGB 10.4*  HCT 32.5*  PLT 201  APTT 30  LABPROT 13.2  INR 0.98  CREATININE 1.03*    Estimated Creatinine Clearance: 48.2 mL/min (by C-G formula based on Cr of 1.03).   Medical History: Past Medical History  Diagnosis Date  . Colon polyp   . Headache(784.0)   . Hyperlipidemia   . Arthritis   . Family history of cardiovascular disease   . HTN (hypertension)   . IBS (irritable bowel syndrome)     followed by Dr. [name]  . Hypertension   . IBS (irritable bowel syndrome)   . GERD (gastroesophageal reflux disease)   . Bleeding tendency ([place]) 05/16/2012    Hx post op bleeding (epidural hematoma s/p ACDF '10; LLQ hematoma due to superficial vessel fascial bleed post colon resection; Normal platelet count; saw Dr. [name] '14  . Melanoma ([place])     removed from back 04/2013  . Dysrhythmia     SVT  . SVT (supraventricular tachycardia) ([place])   . Anxiety   . Urinary urgency   . Liver cyst   . Diverticulitis   . Fracture of right fibula   . Stroke ([place]) 06/2012    mild  . Shortness of breath dyspnea     with exertion  . UTI (lower urinary tract infection)     x 2 since June 2016    Medications:  Prescriptions prior to admission  Medication Sig Dispense Refill Last Dose  . acetaminophen (TYLENOL) 650 MG CR tablet Take 1,300 mg by mouth every 8 (eight) hours as needed for pain.    Past  Week at Unknown time  . ALPRAZolam (XANAX) 0.5 MG tablet TAKE 1 TABLET BY MOUTH 3 TIMES A DAY AS NEEDED FOR SLEEP (Patient taking differently: 0.25) 65 tablet 2 05/31/2015 at 0415  . aspirin 325 MG tablet Take 1 tablet (325 mg total) by mouth daily. 30 tablet 0 05/31/2015 at 0415  . Calcium Carbonate (CALCIUM 500 PO) Take 1 tablet by mouth 2 (two) times daily.   05/30/2015 at Unknown time  . celecoxib (CELEBREX) 200 MG capsule Take 200 mg by mouth daily as needed for mild pain.   05/30/2015 at Unknown time  . cetirizine (ZYRTEC) 10 MG tablet Take 10 mg by mouth every morning.    05/30/2015 at Unknown time  . Cholecalciferol (VITAMIN D3) 2000 UNITS TABS Take 1 tablet by mouth every morning.    05/30/2015 at Unknown time  . clopidogrel (PLAVIX) 75 MG tablet Take 75 mg by mouth daily.   05/31/2015 at 0415  . cycloSPORINE (RESTASIS) 0.05 % ophthalmic emulsion Place 1 drop into both eyes 2 (two) times daily.   Past Month at Unknown time  . estradiol (VIVELLE-DOT) 0.05 MG/24HR patch Place 1 patch onto the skin 2 (two) times a week.  3 05/31/2015 at Unknown time  . FERROUS SULFATE  PO Take by mouth daily.   05/30/2015 at Unknown time  . fluticasone (FLONASE) 50 MCG/ACT nasal spray Place 1 spray into both nostrils daily.   Past Month at Unknown time  . gabapentin (NEURONTIN) 300 MG capsule Take 300 mg by mouth 2 (two) times daily.    05/30/2015 at Unknown time  . Loperamide HCl (IMODIUM PO) Take 1 tablet by mouth daily.    05/31/2015 at 0415  . losartan (COZAAR) 50 MG tablet Take 50 mg by mouth 2 (two) times daily.  3 05/30/2015 at Unknown time  . Magnesium 250 MG TABS Take 1 tablet by mouth daily.   05/30/2015 at Unknown time  . omeprazole (PRILOSEC) 40 MG capsule Take 40 mg by mouth daily.  3 05/30/2015 at Unknown time  . potassium chloride (KLOR-CON 10) 10 MEQ tablet Take 1 tablet (10 mEq total) by mouth 2 (two) times daily. (Patient taking differently: Take 10 mEq by mouth daily. ) 180 tablet 3 05/30/2015 at Unknown  time  . rosuvastatin (CRESTOR) 20 MG tablet Take 1 tablet (20 mg total) by mouth daily. 90 tablet 3 05/30/2015 at Unknown time  . traZODone (DESYREL) 100 MG tablet Take 100 mg by mouth at bedtime.   05/30/2015 at Unknown time  . VESICARE 5 MG tablet Take 5 mg by mouth daily.  3 05/30/2015 at Unknown time  . metoprolol (LOPRESSOR) 50 MG tablet Take 1 tablet (50 mg total) by mouth as needed. 90 tablet 3 More than a month at Unknown time  . SUMAtriptan (IMITREX) 50 MG tablet Take 50 mg by mouth every 2 (two) hours as needed for migraine. May repeat in 2 hours if headache persists or recurs.   More than a month at Unknown time   Scheduled:  . aspirin  325 mg Oral 60 min Pre-Op  . ceFAZolin      . heparin      . niMODipine  0-60 mg Oral 60 min Pre-Op   Infusions:  . sodium chloride    . heparin    . lactated ringers 10 mL/hr at 05/31/15 0724  . niCARDipine      Assessment: 70yo female to start heparin for post stent assisted angioplasty.   Goal of Therapy:  Heparin level 0.1-0.25 units/ml Monitor platelets by anticoagulation protocol: Yes    Plan:  Start heparin infusion at 500 units/hr Check anti-Xa level in 6 hours and daily while on heparin Continue to monitor H&H and platelets  Andrey Cota. Diona Foley, PharmD, BCPS Clinical Pharmacist Pager [phone] 05/31/2015,11:44 AM

## 2015-05-31 NOTE — Anesthesia Procedure Notes (Signed)
Procedure Name: MAC Date/Time: 05/31/2015 9:06 AM Performed by: Jenne Campus Pre-anesthesia Checklist: Patient identified, Emergency Drugs available, Suction available, Patient being monitored and Timeout performed Patient Re-evaluated:Patient Re-evaluated prior to inductionOxygen Delivery Method: Nasal cannula

## 2015-05-31 NOTE — Progress Notes (Signed)
6 Fr. Exoseal to right groin. 

## 2015-06-01 ENCOUNTER — Encounter (HOSPITAL_COMMUNITY): Payer: Self-pay | Admitting: Interventional Radiology

## 2015-06-01 DIAGNOSIS — I658 Occlusion and stenosis of other precerebral arteries: Secondary | ICD-10-CM | POA: Diagnosis not present

## 2015-06-01 LAB — CBC WITH DIFFERENTIAL/PLATELET
Basophils Absolute: 0 10*3/uL (ref 0.0–0.1)
Basophils Relative: 0 %
EOS PCT: 5 %
Eosinophils Absolute: 0.3 10*3/uL (ref 0.0–0.7)
HCT: 32.5 % — ABNORMAL LOW (ref 36.0–46.0)
Hemoglobin: 10.4 g/dL — ABNORMAL LOW (ref 12.0–15.0)
LYMPHS ABS: 0.9 10*3/uL (ref 0.7–4.0)
LYMPHS PCT: 16 %
MCH: 31.7 pg (ref 26.0–34.0)
MCHC: 32 g/dL (ref 30.0–36.0)
MCV: 99.1 fL (ref 78.0–100.0)
MONO ABS: 0.5 10*3/uL (ref 0.1–1.0)
MONOS PCT: 8 %
Neutro Abs: 4 10*3/uL (ref 1.7–7.7)
Neutrophils Relative %: 71 %
PLATELETS: 211 10*3/uL (ref 150–400)
RBC: 3.28 MIL/uL — ABNORMAL LOW (ref 3.87–5.11)
RDW: 13.3 % (ref 11.5–15.5)
WBC: 5.6 10*3/uL (ref 4.0–10.5)

## 2015-06-01 LAB — HEPARIN LEVEL (UNFRACTIONATED): Heparin Unfractionated: <0.1 IU/mL — ABNORMAL LOW (ref 0.30–0.70)

## 2015-06-01 LAB — BASIC METABOLIC PANEL
Anion gap: 9 (ref 5–15)
BUN: 13 mg/dL (ref 6–20)
CHLORIDE: 106 mmol/L (ref 101–111)
CO2: 26 mmol/L (ref 22–32)
Calcium: 8.9 mg/dL (ref 8.9–10.3)
Creatinine, Ser: 0.97 mg/dL (ref 0.44–1.00)
GFR calc Af Amer: >60 mL/min (ref >60)
GFR calc non Af Amer: 57 mL/min — ABNORMAL LOW (ref >60)
GLUCOSE: 116 mg/dL — AB (ref 65–99)
POTASSIUM: 4.8 mmol/L (ref 3.5–5.1)
Sodium: 141 mmol/L (ref 135–145)

## 2015-06-01 LAB — PLATELET INHIBITION P2Y12: PLATELET FUNCTION P2Y12: 198 [PRU] (ref 194–418)

## 2015-06-01 MED ORDER — PANTOPRAZOLE SODIUM 40 MG PO TBEC
40.0000 mg | DELAYED_RELEASE_TABLET | Freq: Every day | ORAL | Status: DC
  Administered 2015-06-01: 40 mg via ORAL
  Filled 2015-06-01: qty 1

## 2015-06-01 MED ORDER — CALCIUM CARBONATE-VITAMIN D 500-200 MG-UNIT PO TABS
1.0000 | ORAL_TABLET | Freq: Two times a day (BID) | ORAL | Status: DC
  Administered 2015-06-01: 1 via ORAL
  Filled 2015-06-01 (×3): qty 1

## 2015-06-01 MED ORDER — CLOPIDOGREL BISULFATE 75 MG PO TABS: 75.0000 mg | ORAL_TABLET | Freq: Every day | ORAL | Status: DC

## 2015-06-01 MED ORDER — ASPIRIN 325 MG PO TABS: 325.0000 mg | ORAL_TABLET | Freq: Every day | ORAL | Status: DC

## 2015-06-01 MED ORDER — CELECOXIB 200 MG PO CAPS
200.0000 mg | ORAL_CAPSULE | Freq: Every day | ORAL | Status: DC | PRN
  Filled 2015-06-01: qty 1

## 2015-06-01 MED ORDER — SUMATRIPTAN SUCCINATE 50 MG PO TABS
50.0000 mg | ORAL_TABLET | Freq: Two times a day (BID) | ORAL | Status: DC | PRN
  Filled 2015-06-01: qty 1

## 2015-06-01 MED ORDER — ROSUVASTATIN CALCIUM 20 MG PO TABS
20.0000 mg | ORAL_TABLET | Freq: Every day | ORAL | Status: DC
  Administered 2015-06-01: 20 mg via ORAL
  Filled 2015-06-01: qty 1

## 2015-06-01 MED ORDER — DARIFENACIN HYDROBROMIDE ER 7.5 MG PO TB24
7.5000 mg | ORAL_TABLET | Freq: Every day | ORAL | Status: DC
  Administered 2015-06-01: 7.5 mg via ORAL
  Filled 2015-06-01: qty 1

## 2015-06-01 MED ORDER — CYCLOSPORINE 0.05 % OP EMUL
1.0000 [drp] | Freq: Two times a day (BID) | OPHTHALMIC | Status: DC
Start: 2015-06-01 — End: 2015-06-01
  Administered 2015-06-01: 1 [drp] via OPHTHALMIC
  Filled 2015-06-01 (×2): qty 1

## 2015-06-01 MED ORDER — ALPRAZOLAM 0.5 MG PO TABS: 0.5000 mg | ORAL_TABLET | ORAL | Status: DC | PRN

## 2015-06-01 MED ORDER — LORATADINE 10 MG PO TABS
10.0000 mg | ORAL_TABLET | Freq: Every day | ORAL | Status: DC
Start: 2015-06-01 — End: 2015-06-01
  Administered 2015-06-01: 10 mg via ORAL
  Filled 2015-06-01: qty 1

## 2015-06-01 MED ORDER — MAGNESIUM 200 MG PO TABS
200.0000 mg | ORAL_TABLET | Freq: Every day | ORAL | Status: DC
  Filled 2015-06-01: qty 1

## 2015-06-01 MED ORDER — MAGNESIUM OXIDE 400 (241.3 MG) MG PO TABS
200.0000 mg | ORAL_TABLET | Freq: Every day | ORAL | Status: DC
  Administered 2015-06-01: 200 mg via ORAL
  Filled 2015-06-01: qty 0.5

## 2015-06-01 MED ORDER — METOPROLOL TARTRATE 50 MG PO TABS
50.0000 mg | ORAL_TABLET | Freq: Every morning | ORAL | Status: DC
  Filled 2015-06-01 (×2): qty 1

## 2015-06-01 MED ORDER — VITAMIN D 1000 UNITS PO TABS
2000.0000 [IU] | ORAL_TABLET | Freq: Every morning | ORAL | Status: DC
  Administered 2015-06-01: 2000 [IU] via ORAL
  Filled 2015-06-01 (×2): qty 2

## 2015-06-01 MED ORDER — TRAZODONE HCL 100 MG PO TABS
100.0000 mg | ORAL_TABLET | Freq: Every day | ORAL | Status: DC
  Filled 2015-06-01: qty 1

## 2015-06-01 MED ORDER — LOSARTAN POTASSIUM 50 MG PO TABS
50.0000 mg | ORAL_TABLET | Freq: Two times a day (BID) | ORAL | Status: DC
Start: 2015-06-01 — End: 2015-06-01
  Administered 2015-06-01: 50 mg via ORAL
  Filled 2015-06-01: qty 1

## 2015-06-01 MED ORDER — GABAPENTIN 300 MG PO CAPS
300.0000 mg | ORAL_CAPSULE | Freq: Two times a day (BID) | ORAL | Status: DC
  Administered 2015-06-01: 300 mg via ORAL
  Filled 2015-06-01: qty 1

## 2015-06-01 MED ORDER — POTASSIUM CHLORIDE ER 10 MEQ PO TBCR
10.0000 meq | EXTENDED_RELEASE_TABLET | Freq: Every day | ORAL | Status: DC
  Administered 2015-06-01: 10 meq via ORAL
  Filled 2015-06-01: qty 1

## 2015-06-01 MED ORDER — FERROUS SULFATE 325 (65 FE) MG PO TABS
325.0000 mg | ORAL_TABLET | Freq: Every day | ORAL | Status: DC
  Administered 2015-06-01: 325 mg via ORAL
  Filled 2015-06-01: qty 1

## 2015-06-01 MED ORDER — ACETAMINOPHEN 325 MG PO TABS
650.0000 mg | ORAL_TABLET | Freq: Every morning | ORAL | Status: DC
  Administered 2015-06-01: 650 mg via ORAL
  Filled 2015-06-01: qty 2

## 2015-06-01 NOTE — Progress Notes (Signed)
ANTICOAGULATION CONSULT NOTE - Follow Up Consult  Pharmacy Consult for heparin Indication: s/p stent-assisted angioplasty   Labs:  Recent Labs  05/31/15 0647 05/31/15 1603 06/01/15 0411  HGB 10.4*  --  10.4*  HCT 32.5*  --  32.5*  PLT 201  --  211  APTT 30  --   --   LABPROT 13.2  --   --   INR 0.98  --   --   HEPARINUNFRC  --  0.10* <0.10*  CREATININE 1.03*  --   --      Assessment: 70yo female now undetectable on heparin after one level at goal.  Goal of Therapy:  Heparin level 0.1-0.25 units/ml   Plan:  Will increase heparin gtt slightly to 600 units/hr until off at 0700.  Wynona Neat, PharmD, BCPS  06/01/2015,5:30 AM

## 2015-06-01 NOTE — Progress Notes (Signed)
Referring Physician(s): Sethi  Chief Complaint:  Left subclavian artery intrastent stenosis  Subjective:  Procedures    CEREBRAL ANGIOGRAM [id number] (Custom)]      Expand All Collapse All   S/P Lt subclavian artery stent assisted angioplasty for severe symptomatic stenosis     Pt has done well overnight No complaints Eating well Slept well Denies headache Denies vision or speech changes   Allergies: Daypro; Lisinopril; Other; and Simvastatin  Medications: Prior to Admission medications   Medication Sig Start Date End Date Taking? Authorizing Provider  acetaminophen (TYLENOL) 650 MG CR tablet Take 1,300 mg by mouth every 8 (eight) hours as needed for pain.    Yes Historical Provider, MD  ALPRAZolam (XANAX) 0.5 MG tablet TAKE 1 TABLET BY MOUTH 3 TIMES A DAY AS NEEDED FOR SLEEP Patient taking differently: 0.25   Yes Janith Lima, MD  aspirin 325 MG tablet Take 1 tablet (325 mg total) by mouth daily. 11/10/14  Yes Hedy Jacob, PA-C  Calcium Carbonate (CALCIUM 500 PO) Take 1 tablet by mouth 2 (two) times daily.   Yes Historical Provider, MD  celecoxib (CELEBREX) 200 MG capsule Take 200 mg by mouth daily as needed for mild pain.   Yes Historical Provider, MD  cetirizine (ZYRTEC) 10 MG tablet Take 10 mg by mouth every morning.    Yes Historical Provider, MD  Cholecalciferol (VITAMIN D3) 2000 UNITS TABS Take 1 tablet by mouth every morning.    Yes Historical Provider, MD  clopidogrel (PLAVIX) 75 MG tablet Take 75 mg by mouth daily.   Yes Historical Provider, MD  cycloSPORINE (RESTASIS) 0.05 % ophthalmic emulsion Place 1 drop into both eyes 2 (two) times daily.   Yes Historical Provider, MD  estradiol (VIVELLE-DOT) 0.05 MG/24HR patch Place 1 patch onto the skin 2 (two) times a week. 04/21/15  Yes Historical Provider, MD  FERROUS SULFATE PO Take by mouth daily.   Yes Historical Provider, MD  fluticasone (FLONASE) 50 MCG/ACT nasal spray Place 1 spray into both nostrils  daily.   Yes Historical Provider, MD  gabapentin (NEURONTIN) 300 MG capsule Take 300 mg by mouth 2 (two) times daily.    Yes Historical Provider, MD  Loperamide HCl (IMODIUM PO) Take 1 tablet by mouth daily.    Yes Historical Provider, MD  losartan (COZAAR) 50 MG tablet Take 50 mg by mouth 2 (two) times daily. 11/15/14  Yes Historical Provider, MD  Magnesium 250 MG TABS Take 1 tablet by mouth daily.   Yes Historical Provider, MD  omeprazole (PRILOSEC) 40 MG capsule Take 40 mg by mouth daily. 04/21/15  Yes Historical Provider, MD  potassium chloride (KLOR-CON 10) 10 MEQ tablet Take 1 tablet (10 mEq total) by mouth 2 (two) times daily. Patient taking differently: Take 10 mEq by mouth daily.  09/15/13  Yes Janith Lima, MD  rosuvastatin (CRESTOR) 20 MG tablet Take 1 tablet (20 mg total) by mouth daily. 09/16/13  Yes Janith Lima, MD  traZODone (DESYREL) 100 MG tablet Take 100 mg by mouth at bedtime.   Yes Historical Provider, MD  VESICARE 5 MG tablet Take 5 mg by mouth daily. 04/22/15  Yes Historical Provider, MD  metoprolol (LOPRESSOR) 50 MG tablet Take 1 tablet (50 mg total) by mouth as needed. 09/15/13   Janith Lima, MD  SUMAtriptan (IMITREX) 50 MG tablet Take 50 mg by mouth every 2 (two) hours as needed for migraine. May repeat in 2 hours if headache persists or recurs.  Historical Provider, MD     Vital Signs: BP 124/52 mmHg  Pulse 65  Temp(Src) 97.7 F (36.5 C) (Oral)  Resp 15  Ht 5\' 3"  (1.6 m)  Wt 163 lb 2.3 oz (74 kg)  BMI 28.91 kg/m2  SpO2 93%  Physical Exam  Constitutional: She is oriented to person, place, and time. She appears well-nourished.  HENT:  face symmetrical Tongue medline Puffs cheeks= Smile =  Musculoskeletal: Normal range of motion.  Neurological: She is alert and oriented to person, place, and time.  Skin: Skin is warm and dry.  Rt groin NT no bleeding No hematoma Rt foot 2+ pulses  Psychiatric: She has a normal mood and affect. Her behavior is normal.  Judgment and thought content normal.  Nursing note and vitals reviewed.  Labs reviewed---wnl  Imaging: No results found.  Labs:  CBC:  Recent Labs  05/21/15 1156 05/27/15 0830 05/31/15 0647 06/01/15 0411  WBC 6.4 5.2 4.4 5.6  HGB 11.9* 12.5 10.4* 10.4*  HCT 34.9* 37.2 32.5* 32.5*  PLT 255 235 201 211    COAGS:  Recent Labs  10/15/14 0731 11/02/14 1348 05/27/15 0830 05/31/15 0647  INR 0.99 0.97 0.98 0.98  APTT 29 29 30 30     BMP:  Recent Labs  11/10/14 0220 05/27/15 0830 05/31/15 0647 06/01/15 0411  NA 137 140 140 141  K 4.3 4.3 4.1 4.8  CL 107 103 105 106  CO2 24 28 23 26   GLUCOSE 113* 104* 107* 116*  BUN 16 14 18 13   CALCIUM 8.7* 10.0 9.3 8.9  CREATININE 0.95 1.13* 1.03* 0.97  GFRNONAA 59* 48* 53* 57*  GFRAA >60 55* >60 >60    LIVER FUNCTION TESTS:  Recent Labs  05/31/15 0647  BILITOT 0.4  AST 26  ALT 26  ALKPHOS 58  PROT 5.3*  ALBUMIN 3.2*    Assessment and Plan:  L SCA intrastent stenosis Pta/stent 2/13 Dc foley Ambulate Advance diet Plan for dc today To be seen by Dr Estanislado Pandy   Electronically Signed: Monia Sabal A 06/01/2015, 8:32 AM   I spent a total of 15 Minutes at the the patient's bedside AND on the patient's hospital floor or unit, greater than 50% of which was counseling/coordinating care for L SCA pta/stent

## 2015-06-01 NOTE — Discharge Summary (Signed)
Patient ID: Susan Gillespie MRN: [id number] DOB/AGE: [birth year] 72 y.o.  Admit date: 05/31/2015 Discharge date: 06/01/2015  Admission Diagnoses:  Left subclavian artery Intra stent stenosis  Discharge Diagnoses:  Active Problems:   Subclavian artery stenosis, left   Discharged Condition: stable  Hospital Course: Hx L SCA stenosis with angioplasty/stent 10/2014. Found to have intrastent stenosis 02/2015 by doppler Cerebral arteriogram 05/2015 confirms findings and scheduled for intrastnet pta/stent. Procedure performed in IR with Dr Estanislado Pandy [id number] No complications. Admitted overnight to Neuro ICU. No issues This am pt is without complaints. Slept well; eating well Denies headache; numbness;tingling or weakness Has ambulated in hall  Dr Estanislado Pandy has seen and evaluated pt. I have seen and examined pt Plan for discharge to home now.  Plavix 75 mg daily: Rx given #30 + 2 refills ASA 325 mg daily Continue all home meds    Consults: None  Significant Diagnostic Studies: cerebral arteriogram  Treatments:  Procedures    CEREBRAL ANGIOGRAM [id number] (Custom)]      Expand All Collapse All   S/P Lt subclavian artery stent assisted angioplasty for severe symptomatic stenosis       Discharge Exam: Blood pressure 143/58, pulse 74, temperature 97.7 F (36.5 C), temperature source Oral, resp. rate 28, height 5\' 3"  (1.6 m), weight 163 lb 2.3 oz (74 kg), SpO2 99 %.  PE: VSS Appropriate Pleasant Face symmetrical Smile = Tongue midline Heart: RRR Lungs: CTA Abd: soft +BS; NT Extr: FROM; good strength; good sensation = Rt foot 2+ pulses Rt groin NT no bleeding  no hematoma  UOP good- clear yellow Labs reviewed Results for orders placed or performed during the hospital encounter of 05/31/15  APTT  Result Value Ref Range   aPTT 30 24 - 37 seconds  CBC WITH DIFFERENTIAL  Result Value Ref Range   WBC 4.4 4.0 - 10.5 K/uL   RBC 3.30 (L) 3.87 - 5.11 MIL/uL   Hemoglobin 10.4 (L) 12.0 - 15.0 g/dL   HCT 32.5 (L) 36.0 - 46.0 %   MCV 98.5 78.0 - 100.0 fL   MCH 31.5 26.0 - 34.0 pg   MCHC 32.0 30.0 - 36.0 g/dL   RDW 13.3 11.5 - 15.5 %   Platelets 201 150 - 400 K/uL   Neutrophils Relative % 56 %   Neutro Abs 2.5 1.7 - 7.7 K/uL   Lymphocytes Relative 23 %   Lymphs Abs 1.0 0.7 - 4.0 K/uL   Monocytes Relative 13 %   Monocytes Absolute 0.6 0.1 - 1.0 K/uL   Eosinophils Relative 7 %   Eosinophils Absolute 0.3 0.0 - 0.7 K/uL   Basophils Relative 1 %   Basophils Absolute 0.0 0.0 - 0.1 K/uL  Comprehensive metabolic panel  Result Value Ref Range   Sodium 140 135 - 145 mmol/L   Potassium 4.1 3.5 - 5.1 mmol/L   Chloride 105 101 - 111 mmol/L   CO2 23 22 - 32 mmol/L   Glucose, Bld 107 (H) 65 - 99 mg/dL   BUN 18 6 - 20 mg/dL   Creatinine, Ser 1.03 (H) 0.44 - 1.00 mg/dL   Calcium 9.3 8.9 - 10.3 mg/dL   Total Protein 5.3 (L) 6.5 - 8.1 g/dL   Albumin 3.2 (L) 3.5 - 5.0 g/dL   AST 26 15 - 41 U/L   ALT 26 14 - 54 U/L   Alkaline Phosphatase 58 38 - 126 U/L   Total Bilirubin 0.4 0.3 - 1.2 mg/dL   GFR calc non Af  Amer 53 (L) >60 mL/min   GFR calc Af Amer >60 >60 mL/min   Anion gap 12 5 - 15  Platelet inhibition p2y12 (not at [facility])  Result Value Ref Range   Platelet Function  P2Y12 199 194 - 418 PRU  Protime-INR  Result Value Ref Range   Prothrombin Time 13.2 11.6 - 15.2 seconds   INR 0.98 0.00 - 1.49  Heparin level (unfractionated)  Result Value Ref Range   Heparin Unfractionated 0.10 (L) 0.30 - 0.70 IU/mL  Heparin level (unfractionated)  Result Value Ref Range   Heparin Unfractionated <0.10 (L) 0.30 - 0.70 IU/mL  Basic metabolic panel  Result Value Ref Range   Sodium 141 135 - 145 mmol/L   Potassium 4.8 3.5 - 5.1 mmol/L   Chloride 106 101 - 111 mmol/L   CO2 26 22 - 32 mmol/L   Glucose, Bld 116 (H) 65 - 99 mg/dL   BUN 13 6 - 20 mg/dL   Creatinine, Ser 0.97 0.44 - 1.00 mg/dL   Calcium 8.9 8.9 - 10.3 mg/dL   GFR calc non Af Amer 57 (L) >60  mL/min   GFR calc Af Amer >60 >60 mL/min   Anion gap 9 5 - 15  CBC WITH DIFFERENTIAL  Result Value Ref Range   WBC 5.6 4.0 - 10.5 K/uL   RBC 3.28 (L) 3.87 - 5.11 MIL/uL   Hemoglobin 10.4 (L) 12.0 - 15.0 g/dL   HCT 32.5 (L) 36.0 - 46.0 %   MCV 99.1 78.0 - 100.0 fL   MCH 31.7 26.0 - 34.0 pg   MCHC 32.0 30.0 - 36.0 g/dL   RDW 13.3 11.5 - 15.5 %   Platelets 211 150 - 400 K/uL   Neutrophils Relative % 71 %   Neutro Abs 4.0 1.7 - 7.7 K/uL   Lymphocytes Relative 16 %   Lymphs Abs 0.9 0.7 - 4.0 K/uL   Monocytes Relative 8 %   Monocytes Absolute 0.5 0.1 - 1.0 K/uL   Eosinophils Relative 5 %   Eosinophils Absolute 0.3 0.0 - 0.7 K/uL   Basophils Relative 0 %   Basophils Absolute 0.0 0.0 - 0.1 K/uL      Disposition: Left subclavian artery stenosis intrastent stenosis Angioplasty/stent of stenosis with Dr Estanislado Pandy [id number] ---no complication Overnight stay without issue DC now Continue all home meds Plavix 75 mg/ ASA 325 mg daily Rx for Plavix 75 mg #30 (2 refills) Dr Estanislado Pandy has seen and examined pt Plan for 2 week follow up She has good understanding of dc instructions  Discharge Instructions    Call MD for:  difficulty breathing, headache or visual disturbances    Complete by:  As directed      Call MD for:  extreme fatigue    Complete by:  As directed      Call MD for:  hives    Complete by:  As directed      Call MD for:  persistant dizziness or light-headedness    Complete by:  As directed      Call MD for:  persistant nausea and vomiting    Complete by:  As directed      Call MD for:  redness, tenderness, or signs of infection (pain, swelling, redness, odor or green/yellow discharge around incision site)    Complete by:  As directed      Call MD for:  severe uncontrolled pain    Complete by:  As directed      Call MD  for:  temperature >100.4    Complete by:  As directed      Diet - low sodium heart healthy    Complete by:  As directed      Discharge instructions     Complete by:  As directed   Follow up 2 weeks with Dr Estanislado Pandy; continue all home meds; Plavix 75 mg daily; asa 325 mg daily; pt will hear from scheduler for appt date and time---call [phone] if questions or concerns     Discharge wound care:    Complete by:  As directed   May shower today; place new bandaid on Rt groin site daily x 1 week     Driving Restrictions    Complete by:  As directed   No driving x 2 weeks     Increase activity slowly    Complete by:  As directed      Lifting restrictions    Complete by:  As directed   No lifting over 10 lbs x 2 weeks            Medication List    TAKE these medications        acetaminophen 650 MG CR tablet  Commonly known as:  TYLENOL  Take 1,300 mg by mouth every 8 (eight) hours as needed for pain.     ALPRAZolam 0.5 MG tablet  Commonly known as:  XANAX  TAKE 1 TABLET BY MOUTH 3 TIMES A DAY AS NEEDED FOR SLEEP     aspirin 325 MG tablet  Take 1 tablet (325 mg total) by mouth daily.     CALCIUM 500 PO  Take 1 tablet by mouth 2 (two) times daily.     celecoxib 200 MG capsule  Commonly known as:  CELEBREX  Take 200 mg by mouth daily as needed for mild pain.     cetirizine 10 MG tablet  Commonly known as:  ZYRTEC  Take 10 mg by mouth every morning.     clopidogrel 75 MG tablet  Commonly known as:  PLAVIX  Take 75 mg by mouth daily.     cycloSPORINE 0.05 % ophthalmic emulsion  Commonly known as:  RESTASIS  Place 1 drop into both eyes 2 (two) times daily.     estradiol 0.05 MG/24HR patch  Commonly known as:  VIVELLE-DOT  Place 1 patch onto the skin 2 (two) times a week.     FERROUS SULFATE PO  Take by mouth daily.     fluticasone 50 MCG/ACT nasal spray  Commonly known as:  FLONASE  Place 1 spray into both nostrils daily.     gabapentin 300 MG capsule  Commonly known as:  NEURONTIN  Take 300 mg by mouth 2 (two) times daily.     IMODIUM PO  Take 1 tablet by mouth daily.     losartan 50 MG tablet  Commonly  known as:  COZAAR  Take 50 mg by mouth 2 (two) times daily.     Magnesium 250 MG Tabs  Take 1 tablet by mouth daily.     metoprolol 50 MG tablet  Commonly known as:  LOPRESSOR  Take 1 tablet (50 mg total) by mouth as needed.     omeprazole 40 MG capsule  Commonly known as:  PRILOSEC  Take 40 mg by mouth daily.     potassium chloride 10 MEQ tablet  Commonly known as:  KLOR-CON 10  Take 1 tablet (10 mEq total) by mouth 2 (two) times daily.  rosuvastatin 20 MG tablet  Commonly known as:  CRESTOR  Take 1 tablet (20 mg total) by mouth daily.     SUMAtriptan 50 MG tablet  Commonly known as:  IMITREX  Take 50 mg by mouth every 2 (two) hours as needed for migraine. May repeat in 2 hours if headache persists or recurs.     traZODone 100 MG tablet  Commonly known as:  DESYREL  Take 100 mg by mouth at bedtime.     VESICARE 5 MG tablet  Generic drug:  solifenacin  Take 5 mg by mouth daily.     Vitamin D3 2000 units Tabs  Take 1 tablet by mouth every morning.           Follow-up Information    Follow up with DEVESHWAR, Fritz Pickerel, MD In 2 weeks.   Specialty:  Interventional Radiology   Why:  follow up 2 weeks with Dr Estanislado Pandy; pt will hear from scheduler for time and date---call [phone] if needs   Contact information:   [address] [place] [address] [phone]        Electronically Signed: Monia Sabal A 06/01/2015, 10:15 AM   I have spent Greater Than 30 Minutes discharging Susan Gillespie.

## 2015-06-03 ENCOUNTER — Ambulatory Visit: Payer: Medicare Other | Admitting: Neurology

## 2015-06-14 ENCOUNTER — Encounter (HOSPITAL_COMMUNITY): Payer: PPO | Attending: Internal Medicine

## 2015-06-14 DIAGNOSIS — R0789 Other chest pain: Secondary | ICD-10-CM | POA: Diagnosis not present

## 2015-06-14 LAB — MYOCARDIAL PERFUSION IMAGING
CHL CUP NUCLEAR SRS: 4
CHL CUP NUCLEAR SSS: 5
CSEPPHR: 87 {beats}/min
LHR: 0.29
LV dias vol: 68 mL
LVSYSVOL: 25 mL
NUC STRESS TID: 1.17
Rest HR: 67 {beats}/min
SDS: 2

## 2015-06-14 MED ORDER — TECHNETIUM TC 99M SESTAMIBI GENERIC - CARDIOLITE
11.0000 | Freq: Once | INTRAVENOUS | Status: AC | PRN
  Administered 2015-06-14: 11 via INTRAVENOUS

## 2015-06-14 MED ORDER — REGADENOSON 0.4 MG/5ML IV SOLN
0.4000 mg | Freq: Once | INTRAVENOUS | Status: AC
  Administered 2015-06-14: 0.4 mg via INTRAVENOUS

## 2015-06-14 MED ORDER — TECHNETIUM TC 99M SESTAMIBI GENERIC - CARDIOLITE
31.7000 | Freq: Once | INTRAVENOUS | Status: AC | PRN
  Administered 2015-06-14: 31.7 via INTRAVENOUS

## 2015-06-21 ENCOUNTER — Ambulatory Visit (HOSPITAL_COMMUNITY)
Admission: RE | Admit: 2015-06-21 | Discharge: 2015-06-21 | Disposition: A | Discharge status: Home / Self Care | Payer: PPO | Source: Ambulatory Visit | Attending: Radiology | Admitting: Radiology

## 2015-06-21 DIAGNOSIS — I708 Atherosclerosis of other arteries: Secondary | ICD-10-CM | POA: Diagnosis not present

## 2015-06-21 DIAGNOSIS — I6522 Occlusion and stenosis of left carotid artery: Secondary | ICD-10-CM | POA: Diagnosis not present

## 2015-06-21 DIAGNOSIS — Z7982 Long term (current) use of aspirin: Secondary | ICD-10-CM | POA: Diagnosis not present

## 2015-06-21 DIAGNOSIS — Z7902 Long term (current) use of antithrombotics/antiplatelets: Secondary | ICD-10-CM | POA: Diagnosis not present

## 2015-06-21 DIAGNOSIS — Z48812 Encounter for surgical aftercare following surgery on the circulatory system: Secondary | ICD-10-CM | POA: Insufficient documentation

## 2015-06-21 DIAGNOSIS — I771 Stricture of artery: Secondary | ICD-10-CM

## 2015-06-21 LAB — CBC WITH DIFFERENTIAL/PLATELET
BASOS ABS: 0 10*3/uL (ref 0.0–0.1)
Basophils Relative: 0 %
EOS ABS: 0.2 10*3/uL (ref 0.0–0.7)
EOS PCT: 2 %
HCT: 35.7 % — ABNORMAL LOW (ref 36.0–46.0)
Hemoglobin: 11.8 g/dL — ABNORMAL LOW (ref 12.0–15.0)
LYMPHS ABS: 1.1 10*3/uL (ref 0.7–4.0)
LYMPHS PCT: 16 %
MCH: 32.3 pg (ref 26.0–34.0)
MCHC: 33.1 g/dL (ref 30.0–36.0)
MCV: 97.8 fL (ref 78.0–100.0)
MONO ABS: 0.5 10*3/uL (ref 0.1–1.0)
Monocytes Relative: 7 %
Neutro Abs: 4.8 10*3/uL (ref 1.7–7.7)
Neutrophils Relative %: 75 %
PLATELETS: 230 10*3/uL (ref 150–400)
RBC: 3.65 MIL/uL — ABNORMAL LOW (ref 3.87–5.11)
RDW: 12.8 % (ref 11.5–15.5)
WBC: 6.4 10*3/uL (ref 4.0–10.5)

## 2015-06-21 LAB — PLATELET INHIBITION P2Y12: PLATELET FUNCTION P2Y12: 123 [PRU] — AB (ref 194–418)

## 2015-06-30 ENCOUNTER — Other Ambulatory Visit: Payer: Self-pay

## 2015-06-30 DIAGNOSIS — Z1231 Encounter for screening mammogram for malignant neoplasm of breast: Secondary | ICD-10-CM

## 2015-07-06 DIAGNOSIS — L821 Other seborrheic keratosis: Secondary | ICD-10-CM | POA: Diagnosis not present

## 2015-07-06 DIAGNOSIS — Z1283 Encounter for screening for malignant neoplasm of skin: Secondary | ICD-10-CM | POA: Diagnosis not present

## 2015-07-06 DIAGNOSIS — Z85820 Personal history of malignant melanoma of skin: Secondary | ICD-10-CM | POA: Diagnosis not present

## 2015-07-06 DIAGNOSIS — L570 Actinic keratosis: Secondary | ICD-10-CM | POA: Diagnosis not present

## 2015-07-06 DIAGNOSIS — Z08 Encounter for follow-up examination after completed treatment for malignant neoplasm: Secondary | ICD-10-CM | POA: Diagnosis not present

## 2015-07-06 DIAGNOSIS — X32XXXD Exposure to sunlight, subsequent encounter: Secondary | ICD-10-CM | POA: Diagnosis not present

## 2015-07-07 DIAGNOSIS — G5761 Lesion of plantar nerve, right lower limb: Secondary | ICD-10-CM | POA: Diagnosis not present

## 2015-07-07 DIAGNOSIS — M7751 Other enthesopathy of right foot: Secondary | ICD-10-CM | POA: Diagnosis not present

## 2015-07-08 DIAGNOSIS — H2513 Age-related nuclear cataract, bilateral: Secondary | ICD-10-CM | POA: Diagnosis not present

## 2015-07-16 ENCOUNTER — Ambulatory Visit
Admission: RE | Admit: 2015-07-16 | Discharge: 2015-07-16 | Disposition: A | Discharge status: Home / Self Care | Payer: PPO | Source: Ambulatory Visit

## 2015-07-16 DIAGNOSIS — Z1231 Encounter for screening mammogram for malignant neoplasm of breast: Secondary | ICD-10-CM

## 2015-09-09 ENCOUNTER — Other Ambulatory Visit (HOSPITAL_COMMUNITY): Payer: Self-pay | Admitting: Interventional Radiology

## 2015-09-09 DIAGNOSIS — G458 Other transient cerebral ischemic attacks and related syndromes: Secondary | ICD-10-CM

## 2015-09-23 ENCOUNTER — Ambulatory Visit (HOSPITAL_COMMUNITY)
Admission: RE | Admit: 2015-09-23 | Discharge: 2015-09-23 | Disposition: A | Discharge status: Home / Self Care | Payer: PPO | Source: Ambulatory Visit | Attending: Interventional Radiology | Admitting: Interventional Radiology

## 2015-09-23 ENCOUNTER — Encounter (HOSPITAL_COMMUNITY): Payer: Self-pay

## 2015-09-23 DIAGNOSIS — I6523 Occlusion and stenosis of bilateral carotid arteries: Secondary | ICD-10-CM | POA: Diagnosis not present

## 2015-09-23 DIAGNOSIS — I6502 Occlusion and stenosis of left vertebral artery: Secondary | ICD-10-CM | POA: Diagnosis not present

## 2015-09-23 DIAGNOSIS — G458 Other transient cerebral ischemic attacks and related syndromes: Secondary | ICD-10-CM

## 2015-09-23 LAB — POCT I-STAT CREATININE: CREATININE: 0.9 mg/dL (ref 0.44–1.00)

## 2015-09-23 MED ORDER — IOPAMIDOL (ISOVUE-370) INJECTION 76%
INTRAVENOUS | Status: AC
  Administered 2015-09-23: 50 mL
  Filled 2015-09-23: qty 50

## 2015-09-23 MED ORDER — IOPAMIDOL (ISOVUE-370) INJECTION 76%
INTRAVENOUS | Status: AC
  Filled 2015-09-23: qty 50

## 2015-09-28 DIAGNOSIS — Z85820 Personal history of malignant melanoma of skin: Secondary | ICD-10-CM | POA: Diagnosis not present

## 2015-09-28 DIAGNOSIS — Z08 Encounter for follow-up examination after completed treatment for malignant neoplasm: Secondary | ICD-10-CM | POA: Diagnosis not present

## 2015-09-28 DIAGNOSIS — X32XXXD Exposure to sunlight, subsequent encounter: Secondary | ICD-10-CM | POA: Diagnosis not present

## 2015-09-28 DIAGNOSIS — L821 Other seborrheic keratosis: Secondary | ICD-10-CM | POA: Diagnosis not present

## 2015-09-28 DIAGNOSIS — Z1283 Encounter for screening for malignant neoplasm of skin: Secondary | ICD-10-CM | POA: Diagnosis not present

## 2015-09-28 DIAGNOSIS — L570 Actinic keratosis: Secondary | ICD-10-CM | POA: Diagnosis not present

## 2015-09-30 ENCOUNTER — Telehealth (HOSPITAL_COMMUNITY): Payer: Self-pay

## 2015-09-30 NOTE — Telephone Encounter (Signed)
After recent CT, Dr. Estanislado Pandy asked pt to take BP in both arms and let him know the results. Pt took BP on 09/27/15. Left arm: 113/70 Right arm:125/67. Pt had svt on 09/23/15 and 09/27/15 in the 160's. She took metoprolol  And it took about 20 minutes for it to go down. After svt she was very fatigue both times. She also stated that she gets very short of breath when lifting her arms. Informed pt that I would get this info to Dr. Estanislado Pandy and get back with her his plan. Pt agreed. AW

## 2015-10-20 DIAGNOSIS — Z0000 Encounter for general adult medical examination without abnormal findings: Secondary | ICD-10-CM | POA: Diagnosis not present

## 2015-10-20 DIAGNOSIS — H95112 Chronic inflammation of postmastoidectomy cavity, left ear: Secondary | ICD-10-CM | POA: Diagnosis not present

## 2015-10-20 DIAGNOSIS — E782 Mixed hyperlipidemia: Secondary | ICD-10-CM | POA: Diagnosis not present

## 2015-10-20 DIAGNOSIS — E538 Deficiency of other specified B group vitamins: Secondary | ICD-10-CM | POA: Diagnosis not present

## 2015-10-20 DIAGNOSIS — R739 Hyperglycemia, unspecified: Secondary | ICD-10-CM | POA: Diagnosis not present

## 2015-10-20 DIAGNOSIS — H903 Sensorineural hearing loss, bilateral: Secondary | ICD-10-CM | POA: Diagnosis not present

## 2015-10-20 DIAGNOSIS — R399 Unspecified symptoms and signs involving the genitourinary system: Secondary | ICD-10-CM | POA: Diagnosis not present

## 2015-10-26 DIAGNOSIS — R1314 Dysphagia, pharyngoesophageal phase: Secondary | ICD-10-CM | POA: Diagnosis not present

## 2015-10-26 DIAGNOSIS — Z8719 Personal history of other diseases of the digestive system: Secondary | ICD-10-CM | POA: Diagnosis not present

## 2015-10-27 ENCOUNTER — Telehealth: Payer: Self-pay | Admitting: Cardiology

## 2015-10-27 NOTE — Telephone Encounter (Signed)
Ne message     Request for surgical clearance:  1. What type of surgery is being performed? Colonoscopy and endoscopy  2. When is this surgery scheduled? pending  3. Are there any medications that need to be held prior to surgery and how long? Cardiac clearance  4. Name of physician performing surgery? Dr. Lucia Gaskins  5. What is your office phone and fax number? Office [phone]/fx number [phone]    The office is asking you disregard the clearance for [facility] wrong pt

## 2015-10-27 NOTE — Telephone Encounter (Signed)
Neurology has her on Plavix and ASA for history of CVA so this needs to be addressed with them

## 2015-10-27 NOTE — Telephone Encounter (Signed)
New message      Request for surgical clearance:  What type of surgery is being performed?  Upper endoscopy and colonoscopy at the same time 1. When is this surgery scheduled?  Pending clearance  Are there any medications that need to be held prior to surgery and how long? Need cardiac clearance only 2. Name of physician performing surgery?  Dr Lucia Gaskins  3. What is your office phone and fax number?  Fax [phone]

## 2015-10-27 NOTE — Telephone Encounter (Signed)
Forwarded to Dr. Lucia Gaskins via fax # [phone].

## 2015-10-29 ENCOUNTER — Encounter: Payer: Self-pay | Admitting: Cardiology

## 2015-10-29 ENCOUNTER — Ambulatory Visit (INDEPENDENT_AMBULATORY_CARE_PROVIDER_SITE_OTHER): Payer: PPO | Admitting: Cardiology

## 2015-10-29 VITALS — BP 138/84 | HR 73 | Ht 63.0 in | Wt 163.8 lb

## 2015-10-29 DIAGNOSIS — I471 Supraventricular tachycardia: Secondary | ICD-10-CM | POA: Diagnosis not present

## 2015-10-29 DIAGNOSIS — I708 Atherosclerosis of other arteries: Secondary | ICD-10-CM | POA: Diagnosis not present

## 2015-10-29 DIAGNOSIS — I771 Stricture of artery: Secondary | ICD-10-CM

## 2015-10-29 DIAGNOSIS — I10 Essential (primary) hypertension: Secondary | ICD-10-CM

## 2015-10-29 NOTE — Patient Instructions (Signed)

## 2015-10-29 NOTE — Progress Notes (Signed)
Cardiology Office Note    Date:  10/29/2015   ID:  Susan Gillespie, DOB [birth year], MRN [id number]  PCP:  Jonathon Bellows, MD  Cardiologist:  Fransico Him, MD   Chief Complaint  Patient presents with  . Irregular Heart Beat  . Hypertension    History of Present Illness:  Susan Gillespie is a 70 y.o. female who presents for a one-year follow-up office visit.  In March 2014 she suffered a small stroke. The patient has a past history of paroxysmal supraventricular tachycardia. She has had 2 episodes of SVT in October 2014. She takes metoprolol and the SVT usually breaks within 15 minutes. She has not had any recent episodes of SVT. The patient had an echocardiogram on 06/29/12 at the time of her stroke and the echo showed normal LV function with ejection fraction of 65-75% and there was grade 1 diastolic dysfunction. The patient does not have any history of known ischemic heart disease. She had a remote Cardiolite stress test about 10 years ago which was normal. The patient has a history of high blood pressure. She has a strong family history of ischemic heart disease. She also has a history of left subclavian steal. She underwent stenting by Dr. Judi Cong. She states that there is concern that the stent may be closing off. When she saw Dr. Mare Ferrari last she was complaining of exertional chest tightness.Nuclear stress test was normal at that time.  She is doing well today.  She denies any  LE edema, dizziness, palpitations or syncope. She had another episode of left arm pain that starts in her shoulder and goes across her back under her neck. She did not have any chest pain or pressure, diaphoresis or nausea.  SHe thinks this is related to her underlying subclavian stenosis. She has chronic DOE that is stable and unchanged.  She is going to have another angiogram because they think her left subclavian has restenosed.     Past Medical History  Diagnosis Date  . Colon polyp   .  Headache(784.0)   . Hyperlipidemia   . Arthritis   . Family history of cardiovascular disease   . HTN (hypertension)   . IBS (irritable bowel syndrome)     followed by Dr. [name]  . Hypertension   . IBS (irritable bowel syndrome)   . GERD (gastroesophageal reflux disease)   . Bleeding tendency ([place]) 05/16/2012    Hx post op bleeding (epidural hematoma s/p ACDF '10; LLQ hematoma due to superficial vessel fascial bleed post colon resection; Normal platelet count; saw Dr. [name] '14  . Melanoma ([place])     removed from back 04/2013  . Dysrhythmia     SVT  . SVT (supraventricular tachycardia) ([place])   . Anxiety   . Urinary urgency   . Liver cyst   . Diverticulitis   . Fracture of right fibula   . Stroke ([place]) 06/2012    mild  . Shortness of breath dyspnea     with exertion  . UTI (lower urinary tract infection)     x 2 since June 2016    Past Surgical History  Procedure Laterality Date  . Spine surgery      cervical fusion, returned to OR for post op  bleeding  . Colon surgery      Sigmoid Colectomy, returned for post op bleeding  . Abdominal hysterectomy    . Tonsillectomy    . Cholecystectomy    . Appendectomy    .  Skin cancer excised  04/2013    Melonoma  . Tonsillectomy    . Breast surgery      BIL breast augmentation  . Vagina surgery      anterior posterior repair  . Joint replacement Left     thumb  . Eye surgery      eyelid drooping fixed  . Tubal ligation    . Radiology with anesthesia N/A 11/09/2014    Procedure: STENT PLACEMENT;  Surgeon: Luanne Bras, MD;  Location: [facility];  Service: Radiology;  Laterality: N/A;  . Mastoidectomy revision    . Mastoid debridement    . Colonoscopy w/ polypectomy    . Radiology with anesthesia N/A 05/31/2015    Procedure: RADIOLOGY WITH ANESTHESIA;  Surgeon: Luanne Bras, MD;  Location: [facility];  Service: Radiology;  Laterality: N/A;    Current Medications: Outpatient Prescriptions Prior to Visit  Medication Sig  Dispense Refill  . acetaminophen (TYLENOL) 650 MG CR tablet Take 1,300 mg by mouth every 8 (eight) hours as needed for pain.     Marland Kitchen ALPRAZolam (XANAX) 0.5 MG tablet TAKE 1 TABLET BY MOUTH 3 TIMES A DAY AS NEEDED FOR SLEEP (Patient taking differently: 0.25) 65 tablet 2  . aspirin 325 MG tablet Take 1 tablet (325 mg total) by mouth daily. 30 tablet 0  . Calcium Carbonate (CALCIUM 500 PO) Take 1 tablet by mouth 2 (two) times daily.    . celecoxib (CELEBREX) 200 MG capsule Take 200 mg by mouth daily as needed for mild pain.    . cetirizine (ZYRTEC) 10 MG tablet Take 10 mg by mouth every morning.     . Cholecalciferol (VITAMIN D3) 2000 UNITS TABS Take 1 tablet by mouth every morning.     . clopidogrel (PLAVIX) 75 MG tablet Take 1 tablet (75 mg total) by mouth daily. 30 tablet 2  . cycloSPORINE (RESTASIS) 0.05 % ophthalmic emulsion Place 1 drop into both eyes 2 (two) times daily.    Marland Kitchen estradiol (VIVELLE-DOT) 0.05 MG/24HR patch Place 1 patch onto the skin 2 (two) times a week.  3  . FERROUS SULFATE PO Take by mouth daily.    . fluticasone (FLONASE) 50 MCG/ACT nasal spray Place 1 spray into both nostrils daily.    Marland Kitchen gabapentin (NEURONTIN) 300 MG capsule Take 300 mg by mouth 2 (two) times daily.     . Loperamide HCl (IMODIUM PO) Take 1 tablet by mouth daily.     Marland Kitchen losartan (COZAAR) 50 MG tablet Take 50 mg by mouth 2 (two) times daily.  3  . Magnesium 250 MG TABS Take 1 tablet by mouth daily.    . metoprolol (LOPRESSOR) 50 MG tablet Take 1 tablet (50 mg total) by mouth as needed. 90 tablet 3  . potassium chloride (KLOR-CON 10) 10 MEQ tablet Take 1 tablet (10 mEq total) by mouth 2 (two) times daily. (Patient taking differently: Take 10 mEq by mouth daily. ) 180 tablet 3  . rosuvastatin (CRESTOR) 20 MG tablet Take 1 tablet (20 mg total) by mouth daily. 90 tablet 3  . SUMAtriptan (IMITREX) 50 MG tablet Take 50 mg by mouth every 2 (two) hours as needed for migraine. May repeat in 2 hours if headache persists or  recurs.    . traZODone (DESYREL) 100 MG tablet Take 100 mg by mouth at bedtime.    Marland Kitchen omeprazole (PRILOSEC) 40 MG capsule Take 40 mg by mouth daily. Reported on 10/29/2015  3  . VESICARE 5 MG tablet Take  5 mg by mouth daily. Reported on 10/29/2015  3   No facility-administered medications prior to visit.     Allergies:   Daypro; Lisinopril; Other; and Simvastatin   Social History   Social History  . Marital Status: Divorced    Spouse Name: N/A  . Number of Children: 2  . Years of Education: 14   Occupational History  . 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 Smoking status: Former Smoker -- 2.00 packs/day for 22 years    Types: Cigarettes    Quit date: 04/23/1985  . Smokeless tobacco: Never Used  . Alcohol Use: 2.4 oz/week    4 Glasses of wine per week     Comment: 1 drink a  day  . Drug Use: No  . Sexual Activity: Yes    Birth Control/ Protection: Surgical   Other Topics Concern  . None   Social History Narrative   ** Merged History Encounter **       Domestic partner   Regular exercise-no   Right handed   Caffeine use-- 2 cups coffee daily, rare soda     Family History:  The patient's family history includes Cancer in her other; Heart attack in her brother and sister; Heart disease in her brother, father, and sister; Hyperlipidemia in her other; Hypertension in her other; Stroke in her other and son.   ROS:   Please see the history of present illness.    ROS All other systems reviewed and are negative.   PHYSICAL EXAM:   VS:  BP 138/84 mmHg  Pulse 73  Ht 5\' 3"  (1.6 m)  Wt 163 lb 12.8 oz (74.299 kg)  BMI 29.02 kg/m2  SpO2 99%   GEN: Well nourished, well developed, in no acute distress HEENT: normal Neck: no JVD, carotid bruits, or masses Cardiac: RRR; no murmurs, rubs, or gallops,no edema.  Intact distal pulses bilaterally.  Respiratory:  clear to auscultation bilaterally, normal work of breathing GI: soft, nontender,  nondistended, + BS MS: no deformity or atrophy Skin: warm and dry, no rash Neuro:  Alert and Oriented x 3, Strength and sensation are intact Psych: euthymic mood, full affect  Wt Readings from Last 3 Encounters:  10/29/15 163 lb 12.8 oz (74.299 kg)  05/31/15 163 lb 2.3 oz (74 kg)  05/27/15 163 lb (73.936 kg)      Studies/Labs Reviewed:   EKG:  EKG is not ordered today.  Recent Labs: 05/31/2015: ALT 26 06/01/2015: BUN 13; Potassium 4.8; Sodium 141 06/21/2015: Hemoglobin 11.8*; Platelets 230 09/23/2015: Creatinine, Ser 0.90   Lipid Panel    Component Value Date/Time   CHOL 192 09/15/2013 0922   TRIG 194.0* 09/15/2013 0922   HDL 86.80 09/15/2013 0922   CHOLHDL 2 09/15/2013 0922   VLDL 38.8 09/15/2013 0922   LDLCALC 66 09/15/2013 0922    Additional studies/ records that were reviewed today include:  none    ASSESSMENT:    1. Paroxysmal supraventricular tachycardia ([place])   2. Essential hypertension, benign   3. Subclavian artery stenosis, left      PLAN:  In order of problems listed above:  1. PSVT with no reoccurrence.  Continue BB.   2. HTN - BP controlled on current medical regimen.  Continue BB and ARB.   3. Subclavan stenosis- she is getting another angiogram done for possible restenosis by Dr. Judi Cong.      Medication Adjustments/Labs and Tests Ordered: Current medicines  are reviewed at length with the patient today.  Concerns regarding medicines are outlined above.  Medication changes, Labs and Tests ordered today are listed in the Patient Instructions below.  There are no Patient Instructions on file for this visit.   Signed, Fransico Him, MD  10/29/2015 8:24 AM    [address], [place], [place]  [address] Phone: [phone]; Fax: [phone]

## 2015-10-30 NOTE — Telephone Encounter (Signed)
Clearance to come off plavix and ASA needs to come from Dr. Judi Cong who did her subclavian artery stenting

## 2015-11-01 ENCOUNTER — Telehealth: Payer: Self-pay | Admitting: Cardiology

## 2015-11-01 NOTE — Telephone Encounter (Signed)
See 7/12 phone note.

## 2015-11-01 NOTE — Telephone Encounter (Signed)
New message   [name] calling from [facility] did not disclose any info would like the nurse to call her back.Susan Gillespie

## 2015-11-01 NOTE — Telephone Encounter (Signed)
Faxed to Dr. Lucia Gaskins.

## 2015-11-01 NOTE — Telephone Encounter (Signed)
[name] at [facility] called to clarify that cardiac clearance is needed.  They have already spoken with Dr. Estanislado Pandy about medications.  To Dr. [name] for cardiac clearance.

## 2015-11-01 NOTE — Telephone Encounter (Signed)
Patient is low risk from cardiac standpoint for procedure

## 2015-11-22 ENCOUNTER — Telehealth: Payer: Self-pay | Admitting: Neurology

## 2015-11-22 NOTE — Telephone Encounter (Signed)
See addendum 09/28/15 please.

## 2015-11-22 NOTE — Telephone Encounter (Signed)
LVM for pt advising DR Leonie Man out of office this week. We sent message to Dr Leonie Man.

## 2015-11-22 NOTE — Telephone Encounter (Signed)
Susan Gillespie, please check with Dr. Leonie Man on this. Will route to him as well.

## 2015-11-22 NOTE — Telephone Encounter (Signed)
Dr Rexene Alberts- you are listed as the WID this afternoon, please advise

## 2015-11-22 NOTE — Telephone Encounter (Addendum)
Patient is calling and would like Dr. Leonie Man to look at her CT angiogram and call with clarification as to the results.  She states she is having symptoms that her artery is stopped up as she has extreme fatigue in her left arm and pain shooting from her left arm across her back to her neck.  She has tried to reach Dr. Arlean Hopping office but can get no one to call her back.  She had planned a trip to [place] but needs to know if she is well enough to go.  Please call.

## 2015-11-22 NOTE — Telephone Encounter (Signed)
I called and left message on patient's answering machine should stating that I have reviewed patient's CT angiogram from June 2017 showing patent left subclavian stent and less than 50% left vertebral artery stenosis and not at immediate risk for stroke or TIA. If patient is still having symptoms of left arm pain this may be related to arthritis in the neck which can be evaluated electively. Patient was instructed to call me back if she had further questions

## 2015-11-23 ENCOUNTER — Encounter: Payer: Self-pay | Admitting: Cardiology

## 2015-12-15 ENCOUNTER — Ambulatory Visit: Payer: Medicare Other | Admitting: Neurology

## 2015-12-17 ENCOUNTER — Other Ambulatory Visit (HOSPITAL_COMMUNITY): Payer: Self-pay | Admitting: Interventional Radiology

## 2015-12-17 DIAGNOSIS — G458 Other transient cerebral ischemic attacks and related syndromes: Secondary | ICD-10-CM

## 2015-12-21 ENCOUNTER — Other Ambulatory Visit: Payer: Self-pay | Admitting: Radiology

## 2015-12-23 ENCOUNTER — Other Ambulatory Visit: Payer: Self-pay | Admitting: General Surgery

## 2015-12-23 ENCOUNTER — Ambulatory Visit (HOSPITAL_COMMUNITY)
Admission: RE | Admit: 2015-12-23 | Discharge: 2015-12-23 | Disposition: A | Discharge status: Home / Self Care | Payer: PPO | Source: Ambulatory Visit | Attending: Interventional Radiology | Admitting: Interventional Radiology

## 2015-12-23 DIAGNOSIS — G458 Other transient cerebral ischemic attacks and related syndromes: Secondary | ICD-10-CM

## 2015-12-24 ENCOUNTER — Encounter (HOSPITAL_COMMUNITY): Payer: Self-pay

## 2015-12-24 ENCOUNTER — Ambulatory Visit (HOSPITAL_COMMUNITY)
Admission: RE | Admit: 2015-12-24 | Discharge: 2015-12-24 | Disposition: A | Discharge status: Home / Self Care | Payer: PPO | Source: Ambulatory Visit | Attending: Interventional Radiology | Admitting: Interventional Radiology

## 2015-12-24 ENCOUNTER — Other Ambulatory Visit (HOSPITAL_COMMUNITY): Payer: Self-pay | Admitting: Interventional Radiology

## 2015-12-24 DIAGNOSIS — G450 Vertebro-basilar artery syndrome: Secondary | ICD-10-CM | POA: Insufficient documentation

## 2015-12-24 DIAGNOSIS — E785 Hyperlipidemia, unspecified: Secondary | ICD-10-CM | POA: Insufficient documentation

## 2015-12-24 DIAGNOSIS — Y812 Prosthetic and other implants, materials and accessory general- and plastic-surgery devices associated with adverse incidents: Secondary | ICD-10-CM | POA: Insufficient documentation

## 2015-12-24 DIAGNOSIS — I6523 Occlusion and stenosis of bilateral carotid arteries: Secondary | ICD-10-CM | POA: Diagnosis not present

## 2015-12-24 DIAGNOSIS — M1990 Unspecified osteoarthritis, unspecified site: Secondary | ICD-10-CM | POA: Diagnosis not present

## 2015-12-24 DIAGNOSIS — G458 Other transient cerebral ischemic attacks and related syndromes: Secondary | ICD-10-CM

## 2015-12-24 DIAGNOSIS — Z7902 Long term (current) use of antithrombotics/antiplatelets: Secondary | ICD-10-CM | POA: Diagnosis not present

## 2015-12-24 DIAGNOSIS — I708 Atherosclerosis of other arteries: Secondary | ICD-10-CM | POA: Diagnosis not present

## 2015-12-24 DIAGNOSIS — I10 Essential (primary) hypertension: Secondary | ICD-10-CM | POA: Insufficient documentation

## 2015-12-24 DIAGNOSIS — T82856A Stenosis of peripheral vascular stent, initial encounter: Secondary | ICD-10-CM | POA: Diagnosis not present

## 2015-12-24 DIAGNOSIS — F419 Anxiety disorder, unspecified: Secondary | ICD-10-CM | POA: Insufficient documentation

## 2015-12-24 DIAGNOSIS — Z87891 Personal history of nicotine dependence: Secondary | ICD-10-CM | POA: Diagnosis not present

## 2015-12-24 DIAGNOSIS — K219 Gastro-esophageal reflux disease without esophagitis: Secondary | ICD-10-CM | POA: Insufficient documentation

## 2015-12-24 DIAGNOSIS — I471 Supraventricular tachycardia: Secondary | ICD-10-CM | POA: Diagnosis not present

## 2015-12-24 DIAGNOSIS — I6502 Occlusion and stenosis of left vertebral artery: Secondary | ICD-10-CM | POA: Diagnosis not present

## 2015-12-24 DIAGNOSIS — Z85820 Personal history of malignant melanoma of skin: Secondary | ICD-10-CM | POA: Diagnosis not present

## 2015-12-24 DIAGNOSIS — Z86010 Personal history of colonic polyps: Secondary | ICD-10-CM | POA: Diagnosis not present

## 2015-12-24 DIAGNOSIS — Z8673 Personal history of transient ischemic attack (TIA), and cerebral infarction without residual deficits: Secondary | ICD-10-CM | POA: Insufficient documentation

## 2015-12-24 DIAGNOSIS — I6522 Occlusion and stenosis of left carotid artery: Secondary | ICD-10-CM | POA: Diagnosis not present

## 2015-12-24 DIAGNOSIS — K589 Irritable bowel syndrome without diarrhea: Secondary | ICD-10-CM | POA: Diagnosis not present

## 2015-12-24 DIAGNOSIS — Z8249 Family history of ischemic heart disease and other diseases of the circulatory system: Secondary | ICD-10-CM | POA: Diagnosis not present

## 2015-12-24 DIAGNOSIS — R531 Weakness: Secondary | ICD-10-CM | POA: Diagnosis not present

## 2015-12-24 HISTORY — PX: IR GENERIC HISTORICAL: IMG1180011

## 2015-12-24 LAB — CBC
HEMATOCRIT: 37.5 % (ref 36.0–46.0)
HEMOGLOBIN: 12.1 g/dL (ref 12.0–15.0)
MCH: 32.4 pg (ref 26.0–34.0)
MCHC: 32.3 g/dL (ref 30.0–36.0)
MCV: 100.3 fL — AB (ref 78.0–100.0)
Platelets: 255 10*3/uL (ref 150–400)
RBC: 3.74 MIL/uL — AB (ref 3.87–5.11)
RDW: 12.5 % (ref 11.5–15.5)
WBC: 4.6 10*3/uL (ref 4.0–10.5)

## 2015-12-24 LAB — BASIC METABOLIC PANEL
ANION GAP: 6 (ref 5–15)
BUN: 13 mg/dL (ref 6–20)
CHLORIDE: 107 mmol/L (ref 101–111)
CO2: 27 mmol/L (ref 22–32)
Calcium: 9.8 mg/dL (ref 8.9–10.3)
Creatinine, Ser: 0.99 mg/dL (ref 0.44–1.00)
GFR calc non Af Amer: 56 mL/min — ABNORMAL LOW (ref >60)
Glucose, Bld: 108 mg/dL — ABNORMAL HIGH (ref 65–99)
POTASSIUM: 4.5 mmol/L (ref 3.5–5.1)
SODIUM: 140 mmol/L (ref 135–145)

## 2015-12-24 LAB — PROTIME-INR
INR: 0.96
Prothrombin Time: 12.8 seconds (ref 11.4–15.2)

## 2015-12-24 LAB — APTT: APTT: 23 s — AB (ref 24–36)

## 2015-12-24 MED ORDER — IOPAMIDOL (ISOVUE-300) INJECTION 61%
INTRAVENOUS | Status: AC
  Administered 2015-12-24: 75 mL
  Filled 2015-12-24: qty 150

## 2015-12-24 MED ORDER — MIDAZOLAM HCL 2 MG/2ML IJ SOLN
INTRAMUSCULAR | Status: AC
  Filled 2015-12-24: qty 2

## 2015-12-24 MED ORDER — SODIUM CHLORIDE 0.9 % IV SOLN: INTRAVENOUS | Status: AC

## 2015-12-24 MED ORDER — FENTANYL CITRATE (PF) 100 MCG/2ML IJ SOLN
INTRAMUSCULAR | Status: AC
  Filled 2015-12-24: qty 2

## 2015-12-24 MED ORDER — MIDAZOLAM HCL 2 MG/2ML IJ SOLN
INTRAMUSCULAR | Status: AC | PRN
  Administered 2015-12-24: 1 mg via INTRAVENOUS

## 2015-12-24 MED ORDER — HEPARIN SODIUM (PORCINE) 1000 UNIT/ML IJ SOLN
INTRAMUSCULAR | Status: AC
  Filled 2015-12-24: qty 2

## 2015-12-24 MED ORDER — LIDOCAINE HCL 1 % IJ SOLN
INTRAMUSCULAR | Status: AC
  Filled 2015-12-24: qty 20

## 2015-12-24 MED ORDER — FENTANYL CITRATE (PF) 100 MCG/2ML IJ SOLN
INTRAMUSCULAR | Status: AC | PRN
  Administered 2015-12-24: 25 ug via INTRAVENOUS

## 2015-12-24 MED ORDER — LIDOCAINE HCL 1 % IJ SOLN
INTRAMUSCULAR | Status: AC | PRN
  Administered 2015-12-24: 10 mL

## 2015-12-24 MED ORDER — SODIUM CHLORIDE 0.9 % IV SOLN: Freq: Once | INTRAVENOUS | Status: DC

## 2015-12-24 MED ORDER — HEPARIN SODIUM (PORCINE) 1000 UNIT/ML IJ SOLN
INTRAMUSCULAR | Status: AC | PRN
  Administered 2015-12-24: 1000 [IU] via INTRAVENOUS

## 2015-12-24 NOTE — Sedation Documentation (Signed)
Patient is resting comfortably. 

## 2015-12-24 NOTE — Sedation Documentation (Signed)
Patient denies pain and is resting comfortably.  

## 2015-12-24 NOTE — Discharge Instructions (Signed)

## 2015-12-24 NOTE — H&P (Signed)
Chief Complaint: left subclavian artery stenosis s/p stenting  Referring Physician:Dr. Antony Contras  Supervising Physician: Luanne Bras  Patient Status: Out-pt  HPI: Susan Gillespie is an 70 y.o. female who has a history of a prior CVA as well as left subclavian artery stenosis.  She had this stented by Dr. Estanislado Pandy in February.  She has started noticing lower BPs in that arm as well as fatigue and some occasional numbness.  She had a CTA of the neck in June which did not directly show any issues, but due to her new complaints, Dr. Estanislado Pandy has recommended a dx angiogram to better evaluate this stent.  She presents today for this procedure.  She is on plavix.  Past Medical History:  Past Medical History:  Diagnosis Date  . Anxiety   . Arthritis   . Bleeding tendency ([place]) 05/16/2012   Hx post op bleeding (epidural hematoma s/p ACDF '10; LLQ hematoma due to superficial vessel fascial bleed post colon resection; Normal platelet count; saw Dr. [name] '14  . Colon polyp   . Diverticulitis   . Dysrhythmia    SVT  . Family history of cardiovascular disease   . Fracture of right fibula   . GERD (gastroesophageal reflux disease)   . Headache(784.0)   . HTN (hypertension)   . Hyperlipidemia   . Hypertension   . IBS (irritable bowel syndrome)    followed by Dr. [name]  . IBS (irritable bowel syndrome)   . Liver cyst   . Melanoma ([place])    removed from back 04/2013  . Shortness of breath dyspnea    with exertion  . Stroke ([place]) 06/2012   mild  . SVT (supraventricular tachycardia) ([place])   . Urinary urgency   . UTI (lower urinary tract infection)    x 2 since June 2016    Past Surgical History:  Past Surgical History:  Procedure Laterality Date  . ABDOMINAL HYSTERECTOMY    . APPENDECTOMY    . BREAST SURGERY     BIL breast augmentation  . CHOLECYSTECTOMY    . COLON SURGERY     Sigmoid Colectomy, returned for post op bleeding  . COLONOSCOPY W/ POLYPECTOMY    .  EYE SURGERY     eyelid drooping fixed  . JOINT REPLACEMENT Left    thumb  . MASTOID DEBRIDEMENT    . MASTOIDECTOMY REVISION    . RADIOLOGY WITH ANESTHESIA N/A 11/09/2014   Procedure: STENT PLACEMENT;  Surgeon: Luanne Bras, MD;  Location: [facility];  Service: Radiology;  Laterality: N/A;  . RADIOLOGY WITH ANESTHESIA N/A 05/31/2015   Procedure: RADIOLOGY WITH ANESTHESIA;  Surgeon: Luanne Bras, MD;  Location: [facility];  Service: Radiology;  Laterality: N/A;  . skin cancer excised  04/2013   Melonoma  . SPINE SURGERY     cervical fusion, returned to OR for post op  bleeding  . TONSILLECTOMY    . TONSILLECTOMY    . TUBAL LIGATION    . VAGINA SURGERY     anterior posterior repair    Family History:  Family History  Problem Relation Age of Onset  . Hyperlipidemia Other   . Hypertension Other   . Cancer Other     lung, esophagus, stomach  . Stroke Other   . Heart disease Father   . Heart disease Sister   . Heart attack Sister   . Heart disease Brother   . Heart attack Brother   . Stroke Son     Social  History:  reports that she quit smoking about 30 years ago. Her smoking use included Cigarettes. She has a 44.00 pack-year smoking history. She has never used smokeless tobacco. She reports that she drinks about 2.4 oz of alcohol per week . She reports that she does not use drugs.  Allergies:  Allergies  Allergen Reactions  . Daypro [Oxaprozin] Swelling    facial  . Lisinopril     cough  . Other Itching    narcotics  . Simvastatin     REACTION: myalgias and fatigue    Medications: Medications reviewed in Epic  Please HPI for pertinent positives, otherwise complete 10 system ROS negative.  Mallampati Score: MD Evaluation Airway: WNL Heart: WNL Abdomen: WNL Chest/ Lungs: WNL ASA  Classification: 3 Mallampati/Airway Score: Two  Physical Exam: BP (!) 124/56 (BP Location: Right Arm)   Pulse 69   Temp 97.9 F (36.6 C) (Oral)   SpO2 99%  There is no height or  weight on file to calculate BMI. General: pleasant, WD, WN white female who is laying in bed in NAD HEENT: head is normocephalic, atraumatic.  Sclera are noninjected.  PERRL.  Ears and nose without any masses or lesions.  Mouth is pink and moist Heart: regular, rate, and rhythm.  Normal s1,s2. No obvious murmurs, gallops, or rubs noted.  Palpable radial and pedal pulses bilaterally Lungs: CTAB, no wheezes, rhonchi, or rales noted.  Respiratory effort nonlabored Abd: soft, NT, ND, +BS, no masses, hernias, or organomegaly MS: all 4 extremities are symmetrical with no cyanosis, clubbing, or edema. Psych: A&Ox3 with an appropriate affect.   Labs: Results for orders placed or performed during the hospital encounter of 12/24/15 (from the past 48 hour(s))  APTT     Status: Abnormal   Collection Time: 12/24/15  9:30 AM  Result Value Ref Range   aPTT 23 (L) 24 - 36 seconds  Basic metabolic panel     Status: Abnormal   Collection Time: 12/24/15  9:30 AM  Result Value Ref Range   Sodium 140 135 - 145 mmol/L   Potassium 4.5 3.5 - 5.1 mmol/L   Chloride 107 101 - 111 mmol/L   CO2 27 22 - 32 mmol/L   Glucose, Bld 108 (H) 65 - 99 mg/dL   BUN 13 6 - 20 mg/dL   Creatinine, Ser 0.99 0.44 - 1.00 mg/dL   Calcium 9.8 8.9 - 10.3 mg/dL   GFR calc non Af Amer 56 (L) >60 mL/min   GFR calc Af Amer >60 >60 mL/min    Comment: (NOTE) The eGFR has been calculated using the CKD EPI equation. This calculation has not been validated in all clinical situations. eGFR's persistently <60 mL/min signify possible Chronic Kidney Disease.    Anion gap 6 5 - 15  CBC     Status: Abnormal   Collection Time: 12/24/15  9:30 AM  Result Value Ref Range   WBC 4.6 4.0 - 10.5 K/uL   RBC 3.74 (L) 3.87 - 5.11 MIL/uL   Hemoglobin 12.1 12.0 - 15.0 g/dL   HCT 37.5 36.0 - 46.0 %   MCV 100.3 (H) 78.0 - 100.0 fL   MCH 32.4 26.0 - 34.0 pg   MCHC 32.3 30.0 - 36.0 g/dL   RDW 12.5 11.5 - 15.5 %   Platelets 255 150 - 400 K/uL    Protime-INR     Status: None   Collection Time: 12/24/15  9:30 AM  Result Value Ref Range   Prothrombin Time 12.8  11.4 - 15.2 seconds   INR 0.96     Imaging: No results found.  Assessment/Plan 1. Left subclavian artery stenosis, s/p stenting -we will plan for dc angiogram today to better evaluate this area of concern.   -her labs and vitals have been reviewed -Risks and Benefits discussed with the patient including, but not limited to bleeding, infection, vascular injury or contrast induced renal failure. All of the patient's questions were answered, patient is agreeable to proceed. Consent signed and in chart.   Thank you for this interesting consult.  I greatly enjoyed meeting Susan Gillespie and look forward to participating in their care.  A copy of this report was sent to the requesting provider on this date.  Electronically Signed: Henreitta Cea 12/24/2015, 11:00 AM   I spent a total of    25 Minutes in face to face in clinical consultation, greater than 50% of which was counseling/coordinating care for left subclavian artery stenosis

## 2015-12-24 NOTE — Procedures (Signed)
S/P Bilateral CCA ,RT VA and Lt subclavian angiograms. Findings. 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 2.Approx 65 to 70 % stenosis of Lt ICA prox

## 2015-12-24 NOTE — Sedation Documentation (Signed)
Called to give report. RN not available

## 2015-12-24 NOTE — Progress Notes (Signed)
Pt. taken to Short stay at 1312 with IR tech.  Pt. easily transferred to SS bed via slide. Right groin assessed with receiving RN, Lattie Haw. Pressure dressing clean, dry , intact, tissue soft.  Before my departure, Lattie Haw, RN raises pt. HOB 30 degrees. I advise RN to follow MD post op orders and maintain a flat position. Pt. Is currently on plavix and showed evidence of bruising from tape removal after procedure. RN ignored my concerns, maintained elevated HOB. Dr. Estanislado Pandy notified.

## 2015-12-27 ENCOUNTER — Encounter (HOSPITAL_COMMUNITY): Payer: Self-pay | Admitting: Interventional Radiology

## 2015-12-29 ENCOUNTER — Telehealth (HOSPITAL_COMMUNITY): Payer: Self-pay | Admitting: Radiology

## 2015-12-29 NOTE — Telephone Encounter (Signed)
Called pt, left VM for her to call to set up her intervention procedure with Deveshwar. JM

## 2015-12-31 ENCOUNTER — Other Ambulatory Visit (HOSPITAL_COMMUNITY): Payer: Self-pay | Admitting: Interventional Radiology

## 2015-12-31 DIAGNOSIS — I771 Stricture of artery: Secondary | ICD-10-CM

## 2016-01-03 ENCOUNTER — Telehealth (HOSPITAL_COMMUNITY): Payer: Self-pay | Admitting: *Deleted

## 2016-01-03 ENCOUNTER — Other Ambulatory Visit: Payer: Self-pay | Admitting: Radiology

## 2016-01-03 LAB — PLATELET INHIBITION P2Y12: PLATELET FUNCTION P2Y12: 90 [PRU] — AB (ref 194–418)

## 2016-01-03 NOTE — Telephone Encounter (Signed)
Per Dr. Estanislado Pandy patient is to continue taking current medication regimen of Plavix  75mg  daily and ASA 325mg  daily.  Pt confirmed that is her dose and understands to continue with this dose.

## 2016-01-04 ENCOUNTER — Encounter (HOSPITAL_COMMUNITY): Payer: Self-pay | Admitting: *Deleted

## 2016-01-05 ENCOUNTER — Encounter (HOSPITAL_COMMUNITY): Payer: Self-pay

## 2016-01-05 ENCOUNTER — Encounter (HOSPITAL_COMMUNITY): Payer: Self-pay | Admitting: General Practice

## 2016-01-05 ENCOUNTER — Inpatient Hospital Stay (HOSPITAL_COMMUNITY)
Admission: AD | Admit: 2016-01-05 | Discharge: 2016-01-06 | DRG: 272 | Disposition: A | Discharge status: Home / Self Care | Payer: PPO | Source: Ambulatory Visit | Attending: Interventional Radiology | Admitting: Interventional Radiology

## 2016-01-05 ENCOUNTER — Ambulatory Visit (HOSPITAL_COMMUNITY): Payer: PPO | Admitting: Certified Registered Nurse Anesthetist

## 2016-01-05 ENCOUNTER — Encounter (HOSPITAL_COMMUNITY)
Admission: AD | Disposition: A | Discharge status: Home / Self Care | Payer: Self-pay | Source: Ambulatory Visit | Attending: Interventional Radiology

## 2016-01-05 ENCOUNTER — Ambulatory Visit (HOSPITAL_COMMUNITY)
Admission: RE | Admit: 2016-01-05 | Discharge: 2016-01-05 | Disposition: A | Discharge status: Home / Self Care | Payer: PPO | Source: Ambulatory Visit | Attending: Interventional Radiology | Admitting: Interventional Radiology

## 2016-01-05 DIAGNOSIS — Z79899 Other long term (current) drug therapy: Secondary | ICD-10-CM

## 2016-01-05 DIAGNOSIS — Z8249 Family history of ischemic heart disease and other diseases of the circulatory system: Secondary | ICD-10-CM | POA: Diagnosis not present

## 2016-01-05 DIAGNOSIS — Z87891 Personal history of nicotine dependence: Secondary | ICD-10-CM | POA: Diagnosis not present

## 2016-01-05 DIAGNOSIS — Z7982 Long term (current) use of aspirin: Secondary | ICD-10-CM

## 2016-01-05 DIAGNOSIS — H9190 Unspecified hearing loss, unspecified ear: Secondary | ICD-10-CM | POA: Diagnosis present

## 2016-01-05 DIAGNOSIS — F419 Anxiety disorder, unspecified: Secondary | ICD-10-CM | POA: Diagnosis not present

## 2016-01-05 DIAGNOSIS — I771 Stricture of artery: Secondary | ICD-10-CM

## 2016-01-05 DIAGNOSIS — E785 Hyperlipidemia, unspecified: Secondary | ICD-10-CM | POA: Diagnosis present

## 2016-01-05 DIAGNOSIS — Z23 Encounter for immunization: Secondary | ICD-10-CM | POA: Diagnosis not present

## 2016-01-05 DIAGNOSIS — I658 Occlusion and stenosis of other precerebral arteries: Secondary | ICD-10-CM | POA: Diagnosis not present

## 2016-01-05 DIAGNOSIS — Z823 Family history of stroke: Secondary | ICD-10-CM | POA: Diagnosis not present

## 2016-01-05 DIAGNOSIS — Y838 Other surgical procedures as the cause of abnormal reaction of the patient, or of later complication, without mention of misadventure at the time of the procedure: Secondary | ICD-10-CM | POA: Diagnosis present

## 2016-01-05 DIAGNOSIS — Z85828 Personal history of other malignant neoplasm of skin: Secondary | ICD-10-CM | POA: Diagnosis not present

## 2016-01-05 DIAGNOSIS — K219 Gastro-esophageal reflux disease without esophagitis: Secondary | ICD-10-CM | POA: Diagnosis present

## 2016-01-05 DIAGNOSIS — K589 Irritable bowel syndrome without diarrhea: Secondary | ICD-10-CM | POA: Diagnosis not present

## 2016-01-05 DIAGNOSIS — I10 Essential (primary) hypertension: Secondary | ICD-10-CM | POA: Diagnosis present

## 2016-01-05 DIAGNOSIS — Z809 Family history of malignant neoplasm, unspecified: Secondary | ICD-10-CM

## 2016-01-05 DIAGNOSIS — Z974 Presence of external hearing-aid: Secondary | ICD-10-CM | POA: Diagnosis not present

## 2016-01-05 DIAGNOSIS — Z9049 Acquired absence of other specified parts of digestive tract: Secondary | ICD-10-CM

## 2016-01-05 DIAGNOSIS — I708 Atherosclerosis of other arteries: Secondary | ICD-10-CM | POA: Diagnosis not present

## 2016-01-05 DIAGNOSIS — T82856A Stenosis of peripheral vascular stent, initial encounter: Secondary | ICD-10-CM | POA: Diagnosis not present

## 2016-01-05 DIAGNOSIS — Z7902 Long term (current) use of antithrombotics/antiplatelets: Secondary | ICD-10-CM | POA: Diagnosis not present

## 2016-01-05 DIAGNOSIS — Z8673 Personal history of transient ischemic attack (TIA), and cerebral infarction without residual deficits: Secondary | ICD-10-CM

## 2016-01-05 DIAGNOSIS — M1990 Unspecified osteoarthritis, unspecified site: Secondary | ICD-10-CM | POA: Diagnosis not present

## 2016-01-05 HISTORY — PX: RADIOLOGY WITH ANESTHESIA: SHX6223

## 2016-01-05 HISTORY — PX: IR GENERIC HISTORICAL: IMG1180011

## 2016-01-05 HISTORY — DX: Unspecified hearing loss, unspecified ear: H91.90

## 2016-01-05 LAB — POCT ACTIVATED CLOTTING TIME
Activated Clotting Time: 186 seconds
Activated Clotting Time: 197 seconds

## 2016-01-05 LAB — CBC WITH DIFFERENTIAL/PLATELET
Basophils Absolute: 0 10*3/uL (ref 0.0–0.1)
Basophils Relative: 0 %
EOS PCT: 5 %
Eosinophils Absolute: 0.2 10*3/uL (ref 0.0–0.7)
HCT: 34.3 % — ABNORMAL LOW (ref 36.0–46.0)
HEMOGLOBIN: 10.9 g/dL — AB (ref 12.0–15.0)
LYMPHS ABS: 1.2 10*3/uL (ref 0.7–4.0)
LYMPHS PCT: 24 %
MCH: 31.7 pg (ref 26.0–34.0)
MCHC: 31.8 g/dL (ref 30.0–36.0)
MCV: 99.7 fL (ref 78.0–100.0)
MONOS PCT: 11 %
Monocytes Absolute: 0.6 10*3/uL (ref 0.1–1.0)
NEUTROS PCT: 60 %
Neutro Abs: 2.9 10*3/uL (ref 1.7–7.7)
Platelets: 219 10*3/uL (ref 150–400)
RBC: 3.44 MIL/uL — AB (ref 3.87–5.11)
RDW: 12.5 % (ref 11.5–15.5)
WBC: 4.9 10*3/uL (ref 4.0–10.5)

## 2016-01-05 LAB — COMPREHENSIVE METABOLIC PANEL
ALK PHOS: 47 U/L (ref 38–126)
ALT: 15 U/L (ref 14–54)
AST: 19 U/L (ref 15–41)
Albumin: 3.7 g/dL (ref 3.5–5.0)
Anion gap: 7 (ref 5–15)
BILIRUBIN TOTAL: 0.5 mg/dL (ref 0.3–1.2)
BUN: 13 mg/dL (ref 6–20)
CALCIUM: 9.8 mg/dL (ref 8.9–10.3)
CO2: 23 mmol/L (ref 22–32)
CREATININE: 1.01 mg/dL — AB (ref 0.44–1.00)
Chloride: 108 mmol/L (ref 101–111)
GFR, EST NON AFRICAN AMERICAN: 55 mL/min — AB (ref >60)
Glucose, Bld: 113 mg/dL — ABNORMAL HIGH (ref 65–99)
Potassium: 4.2 mmol/L (ref 3.5–5.1)
Sodium: 138 mmol/L (ref 135–145)
TOTAL PROTEIN: 5.7 g/dL — AB (ref 6.5–8.1)

## 2016-01-05 LAB — PROTIME-INR
INR: 0.98
Prothrombin Time: 13 seconds (ref 11.4–15.2)

## 2016-01-05 LAB — MRSA PCR SCREENING: MRSA by PCR: NEGATIVE

## 2016-01-05 LAB — HEPARIN LEVEL (UNFRACTIONATED): HEPARIN UNFRACTIONATED: 0.23 [IU]/mL — AB (ref 0.30–0.70)

## 2016-01-05 LAB — APTT: aPTT: 29 seconds (ref 24–36)

## 2016-01-05 SURGERY — RADIOLOGY WITH ANESTHESIA
Anesthesia: General

## 2016-01-05 MED ORDER — SODIUM CHLORIDE 0.9 % IV SOLN: INTRAVENOUS | Status: DC

## 2016-01-05 MED ORDER — FENTANYL CITRATE (PF) 100 MCG/2ML IJ SOLN: 25.0000 ug | INTRAMUSCULAR | Status: DC | PRN

## 2016-01-05 MED ORDER — HEPARIN SODIUM (PORCINE) 1000 UNIT/ML IJ SOLN
INTRAMUSCULAR | Status: DC | PRN
  Administered 2016-01-05: 3000 [IU] via INTRAVENOUS

## 2016-01-05 MED ORDER — HEPARIN (PORCINE) IN NACL 100-0.45 UNIT/ML-% IJ SOLN
600.0000 [IU]/h | INTRAMUSCULAR | Status: DC
  Filled 2016-01-05: qty 250

## 2016-01-05 MED ORDER — OXYCODONE HCL 5 MG PO TABS: 5.0000 mg | ORAL_TABLET | Freq: Once | ORAL | Status: DC | PRN

## 2016-01-05 MED ORDER — INFLUENZA VAC SPLIT QUAD 0.5 ML IM SUSY
0.5000 mL | PREFILLED_SYRINGE | INTRAMUSCULAR | Status: AC
  Administered 2016-01-06: 0.5 mL via INTRAMUSCULAR
  Filled 2016-01-05: qty 0.5

## 2016-01-05 MED ORDER — CLOPIDOGREL BISULFATE 75 MG PO TABS: 75.0000 mg | ORAL_TABLET | ORAL | Status: DC

## 2016-01-05 MED ORDER — LIDOCAINE HCL 1 % IJ SOLN
INTRAMUSCULAR | Status: AC | PRN
  Administered 2016-01-05: 10 mL

## 2016-01-05 MED ORDER — OXYCODONE HCL 5 MG/5ML PO SOLN: 5.0000 mg | Freq: Once | ORAL | Status: DC | PRN

## 2016-01-05 MED ORDER — SODIUM CHLORIDE 0.9 % IV SOLN
INTRAVENOUS | Status: DC
  Administered 2016-01-05: 14:00:00 via INTRAVENOUS

## 2016-01-05 MED ORDER — ASPIRIN EC 325 MG PO TBEC
325.0000 mg | DELAYED_RELEASE_TABLET | ORAL | Status: DC
  Filled 2016-01-05: qty 1

## 2016-01-05 MED ORDER — NICARDIPINE HCL IN NACL 20-0.86 MG/200ML-% IV SOLN
5.0000 mg/h | INTRAVENOUS | Status: DC
  Filled 2016-01-05: qty 200

## 2016-01-05 MED ORDER — ACETAMINOPHEN 500 MG PO TABS
1000.0000 mg | ORAL_TABLET | Freq: Four times a day (QID) | ORAL | Status: DC | PRN
  Administered 2016-01-05: 1000 mg via ORAL
  Filled 2016-01-05: qty 2

## 2016-01-05 MED ORDER — ONDANSETRON HCL 4 MG/2ML IJ SOLN
INTRAMUSCULAR | Status: DC | PRN
  Administered 2016-01-05: 4 mg via INTRAVENOUS

## 2016-01-05 MED ORDER — LIDOCAINE HCL 1 % IJ SOLN
INTRAMUSCULAR | Status: AC
  Filled 2016-01-05: qty 20

## 2016-01-05 MED ORDER — LACTATED RINGERS IV SOLN
INTRAVENOUS | Status: DC | PRN
  Administered 2016-01-05: 08:00:00 via INTRAVENOUS

## 2016-01-05 MED ORDER — CEFAZOLIN SODIUM-DEXTROSE 2-4 GM/100ML-% IV SOLN
2.0000 g | INTRAVENOUS | Status: AC
  Administered 2016-01-05: 2 g via INTRAVENOUS
  Filled 2016-01-05: qty 100

## 2016-01-05 MED ORDER — NITROGLYCERIN 1 MG/10 ML FOR IR/CATH LAB
INTRA_ARTERIAL | Status: AC
  Filled 2016-01-05: qty 10

## 2016-01-05 MED ORDER — CLOPIDOGREL BISULFATE 75 MG PO TABS
75.0000 mg | ORAL_TABLET | Freq: Every day | ORAL | Status: DC
  Administered 2016-01-06: 75 mg via ORAL
  Filled 2016-01-05: qty 1

## 2016-01-05 MED ORDER — FENTANYL CITRATE (PF) 100 MCG/2ML IJ SOLN
INTRAMUSCULAR | Status: DC | PRN
  Administered 2016-01-05 (×3): 25 ug via INTRAVENOUS
  Administered 2016-01-05: 50 ug via INTRAVENOUS
  Administered 2016-01-05: 25 ug via INTRAVENOUS

## 2016-01-05 MED ORDER — ONDANSETRON HCL 4 MG/2ML IJ SOLN: 4.0000 mg | Freq: Four times a day (QID) | INTRAMUSCULAR | Status: DC | PRN

## 2016-01-05 MED ORDER — ACETAMINOPHEN 650 MG RE SUPP: 650.0000 mg | Freq: Four times a day (QID) | RECTAL | Status: DC | PRN

## 2016-01-05 MED ORDER — MIDAZOLAM HCL 5 MG/5ML IJ SOLN
INTRAMUSCULAR | Status: DC | PRN
  Administered 2016-01-05: 1 mg via INTRAVENOUS
  Administered 2016-01-05 (×2): .5 mg via INTRAVENOUS

## 2016-01-05 MED ORDER — ONDANSETRON HCL 4 MG/2ML IJ SOLN: 4.0000 mg | Freq: Once | INTRAMUSCULAR | Status: DC | PRN

## 2016-01-05 MED ORDER — ASPIRIN 325 MG PO TABS
325.0000 mg | ORAL_TABLET | Freq: Every day | ORAL | Status: DC
  Administered 2016-01-06: 325 mg via ORAL
  Filled 2016-01-05: qty 1

## 2016-01-05 MED ORDER — NIMODIPINE 30 MG PO CAPS
0.0000 mg | ORAL_CAPSULE | ORAL | Status: AC
  Administered 2016-01-05: 30 mg via ORAL
  Filled 2016-01-05: qty 2

## 2016-01-05 MED ORDER — DIPHENHYDRAMINE HCL 25 MG PO CAPS
25.0000 mg | ORAL_CAPSULE | Freq: Every evening | ORAL | Status: DC | PRN
  Administered 2016-01-05: 25 mg via ORAL
  Filled 2016-01-05: qty 1

## 2016-01-05 MED ORDER — ONDANSETRON HCL 4 MG/2ML IJ SOLN
4.0000 mg | Freq: Once | INTRAMUSCULAR | Status: DC | PRN
Start: 2016-01-05 — End: 2016-01-05

## 2016-01-05 MED ORDER — IOPAMIDOL (ISOVUE-300) INJECTION 61%
INTRAVENOUS | Status: AC
  Administered 2016-01-05: 20 mL
  Filled 2016-01-05: qty 100

## 2016-01-05 MED ORDER — HEPARIN (PORCINE) IN NACL 100-0.45 UNIT/ML-% IJ SOLN
500.0000 [IU]/h | INTRAMUSCULAR | Status: DC
  Administered 2016-01-05: 500 [IU]/h via INTRAVENOUS
  Filled 2016-01-05: qty 250

## 2016-01-05 MED ORDER — HEPARIN (PORCINE) IN NACL 100-0.45 UNIT/ML-% IJ SOLN
INTRAMUSCULAR | Status: AC
  Filled 2016-01-05: qty 250

## 2016-01-05 MED ORDER — IOPAMIDOL (ISOVUE-300) INJECTION 61%
INTRAVENOUS | Status: AC
  Administered 2016-01-05: 95 mL
  Filled 2016-01-05: qty 150

## 2016-01-05 NOTE — Transfer of Care (Signed)
Immediate Anesthesia Transfer of Care Note  Patient: Susan Gillespie  Procedure(s) Performed: Procedure(s): RADIOLOGY WITH ANESTHESIA ANGIOPLASTY WITH STENTNG (N/A)  Patient Location: PACU  Anesthesia Type:MAC  Level of Consciousness: awake, alert  and oriented  Airway & Oxygen Therapy: Patient Spontanous Breathing  Post-op Assessment: Report given to RN  Post vital signs: Reviewed and stable  Last Vitals:  Vitals:   01/05/16 0815  BP: (!) 167/63  Pulse: 64  Resp: 18  Temp: 36.8 C    Last Pain:  Vitals:   01/05/16 0815  TempSrc: Oral      Patients Stated Pain Goal: 2 ([id number])  Complications: No apparent anesthesia complications

## 2016-01-05 NOTE — H&P (Signed)
Chief Complaint: Patient was seen in consultation today for cerebral arteriogram with possible left subclavian artery angioplasty/stent placement at the request of [facility]  Referring Physician(s): Dr Antony Contras  Supervising Physician: Luanne Bras  Patient Status: Outpatient  History of Present Illness: Susan Gillespie is a 70 y.o. female   Known hx L SCA stenosis Original stent placed 11/09/14 pta of intrastent stenosis 06/02/15  12/24/15: Cerebral arteriogram . CEREBRAL ANGIOGRAM [id number] (Custom)]     S/P Bilateral CCA ,RT VA and Lt subclavian angiograms. Findings. 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 2.Approx 65 to 70 % stenosis of Lt ICA prox     Now scheduled for angioplasty/poss stent of intrastent stenosis in IR with Dr Estanislado Pandy  Past Medical History:  Diagnosis Date  . Anxiety   . Arthritis   . Bleeding tendency ([place]) 05/16/2012   Hx post op bleeding (epidural hematoma s/p ACDF '10; LLQ hematoma due to superficial vessel fascial bleed post colon resection; Normal platelet count; saw Dr. [name] '14  . Colon polyp   . Diverticulitis   . Dysrhythmia    SVT  . Family history of cardiovascular disease   . Fracture of right fibula   . GERD (gastroesophageal reflux disease)   . Headache(784.0)    mirgraines - history of  . HOH (hard of hearing)    wears hearing aids  . HTN (hypertension)   . Hyperlipidemia   . Hypertension   . IBS (irritable bowel syndrome)    followed by Dr. [name]  . IBS (irritable bowel syndrome)   . Liver cyst   . Melanoma ([place])    removed from back 04/2013  . Shortness of breath dyspnea    with exertion  . Stroke ([place]) 06/2012   mild  . SVT (supraventricular tachycardia) ([place])   . Urinary urgency   . UTI (lower urinary tract infection)    x 2 since June 2016    Past Surgical History:  Procedure Laterality Date  . ABDOMINAL HYSTERECTOMY    . APPENDECTOMY    .  BREAST SURGERY     BIL breast augmentation  . CHOLECYSTECTOMY    . COLON SURGERY     Sigmoid Colectomy, returned for post op bleeding  . COLONOSCOPY W/ POLYPECTOMY    . EYE SURGERY     eyelid drooping fixed  . IR GENERIC HISTORICAL  12/24/2015   IR ANGIO INTRA EXTRACRAN SEL COM CAROTID INNOMINATE BILAT MOD SED 12/24/2015 Luanne Bras, MD [facility]  . IR GENERIC HISTORICAL  12/24/2015   IR ANGIOGRAM EXTREMITY LEFT 12/24/2015 Luanne Bras, MD [facility]  . IR GENERIC HISTORICAL  12/24/2015   IR ANGIO VERTEBRAL SEL VERTEBRAL UNI R MOD SED 12/24/2015 Luanne Bras, MD [facility]  . JOINT REPLACEMENT Left    thumb  . MASTOID DEBRIDEMENT    . MASTOIDECTOMY REVISION    . RADIOLOGY WITH ANESTHESIA N/A 11/09/2014   Procedure: STENT PLACEMENT;  Surgeon: Luanne Bras, MD;  Location: [facility];  Service: Radiology;  Laterality: N/A;  . RADIOLOGY WITH ANESTHESIA N/A 05/31/2015   Procedure: RADIOLOGY WITH ANESTHESIA;  Surgeon: Luanne Bras, MD;  Location: [facility];  Service: Radiology;  Laterality: N/A;  . skin cancer excised  04/2013   Melonoma  . SPINE SURGERY     cervical fusion, returned to OR for post op  bleeding  . TONSILLECTOMY    . TONSILLECTOMY    . TUBAL LIGATION    . VAGINA SURGERY  anterior posterior repair    Allergies: Daypro [oxaprozin]; Lisinopril; Other; and Simvastatin  Medications: Prior to Admission medications   Medication Sig Start Date End Date Taking? Authorizing Provider  acetaminophen (TYLENOL) 650 MG CR tablet Take 1,300 mg by mouth every 8 (eight) hours as needed for pain.     Historical Provider, MD  ALPRAZolam Duanne Moron) 0.5 MG tablet Take 0.25-0.5 mg by mouth at bedtime as needed for anxiety or sleep.    Historical Provider, MD  aspirin 325 MG tablet Take 1 tablet (325 mg total) by mouth daily. 11/10/14   Hedy Jacob, PA-C  Calcium Carbonate (CALCIUM 500 PO) Take 1 tablet by mouth 2 (two) times daily.    Historical Provider, MD  celecoxib  (CELEBREX) 200 MG capsule Take 200 mg by mouth daily as needed for mild pain.    Historical Provider, MD  cetirizine (ZYRTEC) 10 MG tablet Take 10 mg by mouth every morning.     Historical Provider, MD  Cholecalciferol (VITAMIN D3) 2000 UNITS TABS Take 1 tablet by mouth every morning.     Historical Provider, MD  clopidogrel (PLAVIX) 75 MG tablet Take 1 tablet (75 mg total) by mouth daily. 06/01/15   Ascencion Dike, PA-C  cycloSPORINE (RESTASIS) 0.05 % ophthalmic emulsion Place 1 drop into both eyes 2 (two) times daily as needed (dry eyes).     Historical Provider, MD  estradiol (VIVELLE-DOT) 0.05 MG/24HR patch Place 1 patch onto the skin 2 (two) times a week. 04/21/15   Historical Provider, MD  famotidine (PEPCID) 40 MG tablet Take 40 mg by mouth daily.    Historical Provider, MD  FERROUS SULFATE PO Take 1 tablet by mouth daily.     Historical Provider, MD  fluticasone (FLONASE) 50 MCG/ACT nasal spray Place 1 spray into both nostrils daily as needed for allergies or rhinitis.     Historical Provider, MD  gabapentin (NEURONTIN) 300 MG capsule Take 300 mg by mouth 2 (two) times daily.     Historical Provider, MD  Loperamide HCl (IMODIUM PO) Take 1 tablet by mouth daily.     Historical Provider, MD  losartan (COZAAR) 50 MG tablet Take 50 mg by mouth daily.  11/15/14   Historical Provider, MD  Magnesium 250 MG TABS Take 1 tablet by mouth daily.    Historical Provider, MD  metoprolol (LOPRESSOR) 50 MG tablet Take 1 tablet (50 mg total) by mouth as needed. Patient taking differently: Take 50 mg by mouth daily as needed.  09/15/13   Janith Lima, MD  potassium chloride (KLOR-CON 10) 10 MEQ tablet Take 1 tablet (10 mEq total) by mouth 2 (two) times daily. Patient taking differently: Take 10 mEq by mouth daily.  09/15/13   Janith Lima, MD  rosuvastatin (CRESTOR) 20 MG tablet Take 1 tablet (20 mg total) by mouth daily. 09/16/13   Janith Lima, MD  SUMAtriptan (IMITREX) 50 MG tablet Take 50 mg by mouth every 2  (two) hours as needed for migraine. May repeat in 2 hours if headache persists or recurs.    Historical Provider, MD  traZODone (DESYREL) 100 MG tablet Take 50 mg by mouth at bedtime.     Historical Provider, MD     Family History  Problem Relation Age of Onset  . Hyperlipidemia Other   . Hypertension Other   . Cancer Other     lung, esophagus, stomach  . Stroke Other   . Heart disease Father   . Heart disease Sister   .  Heart attack Sister   . Heart disease Brother   . Heart attack Brother   . Stroke Son     Social History   Social History  . Marital status: Divorced    Spouse name: N/A  . Number of children: 2  . Years of education: 14   Occupational History  . 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 Smoking status: Former Smoker    Packs/day: 2.00    Years: 22.00    Types: Cigarettes    Quit date: 04/23/1985  . Smokeless tobacco: Never Used  . Alcohol use 2.4 oz/week    4 Glasses of wine per week     Comment: 1 drink a  day  . Drug use: No  . Sexual activity: Yes    Birth control/ protection: Surgical   Other Topics Concern  . None   Social History Narrative   ** Merged History Encounter **       Domestic partner   Regular exercise-no   Right handed   Caffeine use-- 2 cups coffee daily, rare soda     Review of Systems: A 12 point ROS discussed and pertinent positives are indicated in the HPI above.  All other systems are negative.  Review of Systems  Constitutional: Positive for fatigue. Negative for activity change, appetite change, fever and unexpected weight change.  Respiratory: Negative for shortness of breath.   Cardiovascular: Negative for chest pain.  Gastrointestinal: Negative for nausea.  Musculoskeletal: Negative for back pain and gait problem.  Neurological: Positive for weakness. Negative for dizziness, tremors, seizures, syncope, facial asymmetry, speech difficulty,  light-headedness, numbness and headaches.       Left arm weakened and tired after use  Psychiatric/Behavioral: Negative for behavioral problems and confusion.    Vital Signs: There were no vitals taken for this visit.  Physical Exam  Constitutional: She is oriented to person, place, and time. She appears well-nourished.  HENT:  Head: Atraumatic.  Eyes: EOM are normal.  Neck: Neck supple.  Cardiovascular: Normal rate, regular rhythm and normal heart sounds.   Pulmonary/Chest: Effort normal and breath sounds normal. She has no wheezes.  Abdominal: Soft. Bowel sounds are normal. There is no tenderness.  Musculoskeletal: Normal range of motion. She exhibits no edema.  Neurological: She is alert and oriented to person, place, and time.  Skin: Skin is warm and dry.  Psychiatric: She has a normal mood and affect. Her behavior is normal. Judgment and thought content normal.  Nursing note and vitals reviewed.   Mallampati Score:  MD Evaluation Airway: WNL Heart: WNL Abdomen: WNL Chest/ Lungs: WNL ASA  Classification: 3 Mallampati/Airway Score: One  Imaging: Ir Angiogram Extremity Left  Result Date: 12/27/2015 CLINICAL DATA:  Symptoms of left arm heaviness and weakness with excessive use of her left arm. Past history of severe left subclavian artery stenosis status post endovascular treatment with stent assisted angioplasty. Also ultrasound of the carotids suggesting high-grade stenosis in the left internal carotid artery proximally. EXAM: BILATERAL COMMON CAROTID AND INNOMINATE ANGIOGRAPHY RIGHT VERTEBRAL ARTERY AND LEFT SUBCLAVIAN ANGIOGRAMS PROCEDURE: Contrast:  Isovue 300 approximately 60 mL Anesthesia/Sedation:  Conscious sedation. Medications: Versed 1 mg IV. Fentanyl 25 mcg IV. Heparin 1000 units IV. Following a full explanation of the procedure along with the potential associated complications, an informed witnessed consent was obtained. The right groin was prepped and draped in  the usual sterile fashion. Thereafter using modified  Seldinger technique, transfemoral access into the right common femoral artery was obtained without difficulty. Over a 0.035 inch guidewire, a 5 French Pinnacle sheath was inserted. Through this, and also over 0.035 inch guidewire, a 5 [place] JB 1 catheter was advanced to the aortic arch region and selectively positioned in the right common carotid artery, the right vertebral artery, the left common carotid artery and the left subclavian artery. There were no acute complications. The patient tolerated the procedure well. FINDINGS: The right common carotid arteriogram demonstrates the right external carotid artery and its major branches to be widely patent. The right internal carotid artery at the bulb to the cranial skull base opacifies normally. There is a medially positioned calcified plaque at the bulb without underlying significant stenosis by the NASCET criteria. No acute ulcerations were seen at this site. The vessel is, otherwise, seen to opacify normally to the cranial skull base. Petrous, cavernous and supraclinoid segments opacify normally. Brisk opacification of the right posterior communicating artery and subsequently the right posterior cerebral artery and superior cerebellar artery is noted. Also demonstrated is retrograde opacification into the mid one-third of the basilar artery from the right internal carotid artery injection. The right middle cerebral artery and the right anterior cerebral artery opacify normally into the capillary and venous phases. Cross filling via the anterior communicating artery of the left anterior cerebral A2 segment is seen. The right vertebral artery origin is normal. The vessel is seen to opacify normally to the cranial skull base. Normal opacification is seen of the right posterior-inferior cerebellar artery and right vertebrobasilar junction. Almost immediate retrograde opacification of the left vertebrobasilar  junction and the left posterior-inferior cerebellar artery is noted to the origin of the left vertebral artery with subsequent antegrade flow noted in the left subclavian artery consistent with left subclavian steal. The opacified portion of the basilar artery, the left posterior cerebral artery, superior cerebellar arteries and the anterior-inferior cerebellar arteries is normal into the capillary and venous phases. Nonvisualization of the right posterior communicating artery is probably related to inflow of non-opacified blood in the right posterior cerebral artery from the dominant right posterior communicating artery as described above. The left common carotid arteriogram demonstrates severe segmental narrowing of the left external carotid artery origin. This is associated with moderate post stenotic dilatation. Branches of the external carotid artery, however, opacify normally. The left internal carotid artery at the bulb demonstrates a complex partially calcified plaque associated with a focal stenosis of 65-70% on the AP projection. Distal to this the left internal carotid artery is seen to opacify normally to the cranial skull base. The petrous, cavernous and supraclinoid segments are widely patent. A left posterior communicating artery is seen opacifying the left posterior cerebral artery distribution. The left middle cerebral artery and the left anterior cerebral artery opacify normally into the capillary and venous phases. Selective injection of the stented segment of the left subclavian artery proximally demonstrates a severe 90% plus stenosis within the mid portion of the stented segment. Distal to the stent there is wide patency of the left subclavian artery to the origin of the left vertebral artery. There is a mild stenosis at the origin of the left vertebral artery. Washout of the left subclavian artery is noted in a retrograde manner secondary to the steal phenomenon as described above. IMPRESSION:  Approximately 90% plus intrastent stenoses of the stented segment of the proximal left subclavian artery. Retrograde flow in the left vertebral artery to supply the left subclavian artery from  the right vertebral artery injection, consistent with subclavian steal. Approximately 65% to 70% stenosis of the left internal carotid artery at the bulb by the NASCET criteria. Severe segmental stenoses of the left external carotid artery origin. Electronically Signed   By: Luanne Bras M.D.   On: 12/25/2015 11:00   Ir Angio Intra Extracran Sel Com Carotid Innominate Bilat Mod Sed  Result Date: 12/27/2015 CLINICAL DATA:  Symptoms of left arm heaviness and weakness with excessive use of her left arm. Past history of severe left subclavian artery stenosis status post endovascular treatment with stent assisted angioplasty. Also ultrasound of the carotids suggesting high-grade stenosis in the left internal carotid artery proximally. EXAM: BILATERAL COMMON CAROTID AND INNOMINATE ANGIOGRAPHY RIGHT VERTEBRAL ARTERY AND LEFT SUBCLAVIAN ANGIOGRAMS PROCEDURE: Contrast:  Isovue 300 approximately 60 mL Anesthesia/Sedation:  Conscious sedation. Medications: Versed 1 mg IV. Fentanyl 25 mcg IV. Heparin 1000 units IV. Following a full explanation of the procedure along with the potential associated complications, an informed witnessed consent was obtained. The right groin was prepped and draped in the usual sterile fashion. Thereafter using modified Seldinger technique, transfemoral access into the right common femoral artery was obtained without difficulty. Over a 0.035 inch guidewire, a 5 French Pinnacle sheath was inserted. Through this, and also over 0.035 inch guidewire, a 5 [place] JB 1 catheter was advanced to the aortic arch region and selectively positioned in the right common carotid artery, the right vertebral artery, the left common carotid artery and the left subclavian artery. There were no acute complications. The  patient tolerated the procedure well. FINDINGS: The right common carotid arteriogram demonstrates the right external carotid artery and its major branches to be widely patent. The right internal carotid artery at the bulb to the cranial skull base opacifies normally. There is a medially positioned calcified plaque at the bulb without underlying significant stenosis by the NASCET criteria. No acute ulcerations were seen at this site. The vessel is, otherwise, seen to opacify normally to the cranial skull base. Petrous, cavernous and supraclinoid segments opacify normally. Brisk opacification of the right posterior communicating artery and subsequently the right posterior cerebral artery and superior cerebellar artery is noted. Also demonstrated is retrograde opacification into the mid one-third of the basilar artery from the right internal carotid artery injection. The right middle cerebral artery and the right anterior cerebral artery opacify normally into the capillary and venous phases. Cross filling via the anterior communicating artery of the left anterior cerebral A2 segment is seen. The right vertebral artery origin is normal. The vessel is seen to opacify normally to the cranial skull base. Normal opacification is seen of the right posterior-inferior cerebellar artery and right vertebrobasilar junction. Almost immediate retrograde opacification of the left vertebrobasilar junction and the left posterior-inferior cerebellar artery is noted to the origin of the left vertebral artery with subsequent antegrade flow noted in the left subclavian artery consistent with left subclavian steal. The opacified portion of the basilar artery, the left posterior cerebral artery, superior cerebellar arteries and the anterior-inferior cerebellar arteries is normal into the capillary and venous phases. Nonvisualization of the right posterior communicating artery is probably related to inflow of non-opacified blood in the right  posterior cerebral artery from the dominant right posterior communicating artery as described above. The left common carotid arteriogram demonstrates severe segmental narrowing of the left external carotid artery origin. This is associated with moderate post stenotic dilatation. Branches of the external carotid artery, however, opacify normally. The left internal carotid artery at  the bulb demonstrates a complex partially calcified plaque associated with a focal stenosis of 65-70% on the AP projection. Distal to this the left internal carotid artery is seen to opacify normally to the cranial skull base. The petrous, cavernous and supraclinoid segments are widely patent. A left posterior communicating artery is seen opacifying the left posterior cerebral artery distribution. The left middle cerebral artery and the left anterior cerebral artery opacify normally into the capillary and venous phases. Selective injection of the stented segment of the left subclavian artery proximally demonstrates a severe 90% plus stenosis within the mid portion of the stented segment. Distal to the stent there is wide patency of the left subclavian artery to the origin of the left vertebral artery. There is a mild stenosis at the origin of the left vertebral artery. Washout of the left subclavian artery is noted in a retrograde manner secondary to the steal phenomenon as described above. IMPRESSION: Approximately 90% plus intrastent stenoses of the stented segment of the proximal left subclavian artery. Retrograde flow in the left vertebral artery to supply the left subclavian artery from the right vertebral artery injection, consistent with subclavian steal. Approximately 65% to 70% stenosis of the left internal carotid artery at the bulb by the NASCET criteria. Severe segmental stenoses of the left external carotid artery origin. Electronically Signed   By: Luanne Bras M.D.   On: 12/25/2015 11:00   Ir Angio Vertebral Sel  Vertebral Uni R Mod Sed  Result Date: 12/27/2015 CLINICAL DATA:  Symptoms of left arm heaviness and weakness with excessive use of her left arm. Past history of severe left subclavian artery stenosis status post endovascular treatment with stent assisted angioplasty. Also ultrasound of the carotids suggesting high-grade stenosis in the left internal carotid artery proximally. EXAM: BILATERAL COMMON CAROTID AND INNOMINATE ANGIOGRAPHY RIGHT VERTEBRAL ARTERY AND LEFT SUBCLAVIAN ANGIOGRAMS PROCEDURE: Contrast:  Isovue 300 approximately 60 mL Anesthesia/Sedation:  Conscious sedation. Medications: Versed 1 mg IV. Fentanyl 25 mcg IV. Heparin 1000 units IV. Following a full explanation of the procedure along with the potential associated complications, an informed witnessed consent was obtained. The right groin was prepped and draped in the usual sterile fashion. Thereafter using modified Seldinger technique, transfemoral access into the right common femoral artery was obtained without difficulty. Over a 0.035 inch guidewire, a 5 French Pinnacle sheath was inserted. Through this, and also over 0.035 inch guidewire, a 5 [place] JB 1 catheter was advanced to the aortic arch region and selectively positioned in the right common carotid artery, the right vertebral artery, the left common carotid artery and the left subclavian artery. There were no acute complications. The patient tolerated the procedure well. FINDINGS: The right common carotid arteriogram demonstrates the right external carotid artery and its major branches to be widely patent. The right internal carotid artery at the bulb to the cranial skull base opacifies normally. There is a medially positioned calcified plaque at the bulb without underlying significant stenosis by the NASCET criteria. No acute ulcerations were seen at this site. The vessel is, otherwise, seen to opacify normally to the cranial skull base. Petrous, cavernous and supraclinoid segments  opacify normally. Brisk opacification of the right posterior communicating artery and subsequently the right posterior cerebral artery and superior cerebellar artery is noted. Also demonstrated is retrograde opacification into the mid one-third of the basilar artery from the right internal carotid artery injection. The right middle cerebral artery and the right anterior cerebral artery opacify normally into the capillary and venous phases.  Cross filling via the anterior communicating artery of the left anterior cerebral A2 segment is seen. The right vertebral artery origin is normal. The vessel is seen to opacify normally to the cranial skull base. Normal opacification is seen of the right posterior-inferior cerebellar artery and right vertebrobasilar junction. Almost immediate retrograde opacification of the left vertebrobasilar junction and the left posterior-inferior cerebellar artery is noted to the origin of the left vertebral artery with subsequent antegrade flow noted in the left subclavian artery consistent with left subclavian steal. The opacified portion of the basilar artery, the left posterior cerebral artery, superior cerebellar arteries and the anterior-inferior cerebellar arteries is normal into the capillary and venous phases. Nonvisualization of the right posterior communicating artery is probably related to inflow of non-opacified blood in the right posterior cerebral artery from the dominant right posterior communicating artery as described above. The left common carotid arteriogram demonstrates severe segmental narrowing of the left external carotid artery origin. This is associated with moderate post stenotic dilatation. Branches of the external carotid artery, however, opacify normally. The left internal carotid artery at the bulb demonstrates a complex partially calcified plaque associated with a focal stenosis of 65-70% on the AP projection. Distal to this the left internal carotid artery is  seen to opacify normally to the cranial skull base. The petrous, cavernous and supraclinoid segments are widely patent. A left posterior communicating artery is seen opacifying the left posterior cerebral artery distribution. The left middle cerebral artery and the left anterior cerebral artery opacify normally into the capillary and venous phases. Selective injection of the stented segment of the left subclavian artery proximally demonstrates a severe 90% plus stenosis within the mid portion of the stented segment. Distal to the stent there is wide patency of the left subclavian artery to the origin of the left vertebral artery. There is a mild stenosis at the origin of the left vertebral artery. Washout of the left subclavian artery is noted in a retrograde manner secondary to the steal phenomenon as described above. IMPRESSION: Approximately 90% plus intrastent stenoses of the stented segment of the proximal left subclavian artery. Retrograde flow in the left vertebral artery to supply the left subclavian artery from the right vertebral artery injection, consistent with subclavian steal. Approximately 65% to 70% stenosis of the left internal carotid artery at the bulb by the NASCET criteria. Severe segmental stenoses of the left external carotid artery origin. Electronically Signed   By: Luanne Bras M.D.   On: 12/25/2015 11:00    Labs:  CBC:  Recent Labs  06/01/15 0411 06/21/15 1345 12/24/15 0930 01/05/16 0700  WBC 5.6 6.4 4.6 4.9  HGB 10.4* 11.8* 12.1 10.9*  HCT 32.5* 35.7* 37.5 34.3*  PLT 211 230 255 219    COAGS:  Recent Labs  05/27/15 0830 05/31/15 0647 12/24/15 0930 01/05/16 0700  INR 0.98 0.98 0.96 0.98  APTT 30 30 23* 29    BMP:  Recent Labs  05/31/15 0647 06/01/15 0411 09/23/15 1423 12/24/15 0930 01/05/16 0700  NA 140 141  --  140 138  K 4.1 4.8  --  4.5 4.2  CL 105 106  --  107 108  CO2 23 26  --  27 23  GLUCOSE 107* 116*  --  108* 113*  BUN 18 13  --   13 13  CALCIUM 9.3 8.9  --  9.8 9.8  CREATININE 1.03* 0.97 0.90 0.99 1.01*  GFRNONAA 53* 57*  --  56* 55*  GFRAA >60 >60  --  >  60 >60    LIVER FUNCTION TESTS:  Recent Labs  05/31/15 0647 01/05/16 0700  BILITOT 0.4 0.5  AST 26 19  ALT 26 15  ALKPHOS 58 47  PROT 5.3* 5.7*  ALBUMIN 3.2* 3.7    TUMOR MARKERS: No results for input(s): AFPTM, CEA, CA199, CHROMGRNA in the last 8760 hours.  Assessment and Plan:  Known Left subclavian artery stenosis PTA/stent placed 10/2014 PTA intrastent stenosis 05/2015 12/24/15 angio suggesting restenosis Now for possible angioplasty/stent Risks and Benefits discussed with the patient including, but not limited to bleeding, infection, vascular injury or contrast induced renal failure. All of the patient's questions were answered, patient is agreeable to proceed. Consent signed and in chart.  Aware if intervention is performed she will be admitted overnight in Neuro ICU. Plan for discharge in am Agreeable to proceed  Thank you for this interesting consult.  I greatly enjoyed meeting Susan Gillespie and look forward to participating in their care.  A copy of this report was sent to the requesting provider on this date.  Electronically Signed: Rosy Estabrook A 01/05/2016, 8:38 AM   I spent a total of  30 Minutes   in face to face in clinical consultation, greater than 50% of which was counseling/coordinating care for L SCA pta/stent

## 2016-01-05 NOTE — Procedures (Signed)
S/P LT subclavian intrastent angioplsty with a 52mm x 38mm Sterling balloon with a patency of 80 to 85%

## 2016-01-05 NOTE — Anesthesia Postprocedure Evaluation (Signed)
Anesthesia Post Note  Patient: Susan Gillespie  Procedure(s) Performed: Procedure(s) (LRB): RADIOLOGY WITH ANESTHESIA ANGIOPLASTY WITH STENTNG (N/A)  Patient location during evaluation: NICU Anesthesia Type: MAC Level of consciousness: awake and alert, oriented and awake Pain management: pain level controlled Vital Signs Assessment: post-procedure vital signs reviewed and stable Respiratory status: spontaneous breathing, nonlabored ventilation and respiratory function stable Cardiovascular status: blood pressure returned to baseline Postop Assessment: no headache Anesthetic complications: no    Last Vitals:  Vitals:   01/05/16 1400 01/05/16 1500  BP: 127/63 123/61  Pulse: 66 63  Resp: 11 (!) 22  Temp:      Last Pain:  Vitals:   01/05/16 1300  TempSrc:   PainSc: 0-No pain                 Karsten Howry COKER

## 2016-01-05 NOTE — Progress Notes (Signed)
Dr. Estanislado Pandy made aware that pt's SBP has been below <120.  He encouraged the pt to drink extra water.  If SBP <100, give 268mL bolus.

## 2016-01-05 NOTE — Sedation Documentation (Signed)
Right groin dressing checked with floor RN. Dressing dry and intact.

## 2016-01-05 NOTE — Sedation Documentation (Signed)
Pt brought to IR #2 by anesthesia. Awake and alert. In no distress. Speech clear and appropriate. MAE well.

## 2016-01-05 NOTE — Progress Notes (Signed)
Supervising Physician: Luanne Bras  Patient Status:  Inpatient  Chief Complaint:  Subclavian stenosis S/P Left SCA angioplasty today by Dr.  Estanislado Pandy  Subjective:  Susan Gillespie is doing well. She has no complaints.  Allergies: Daypro [oxaprozin]; Lisinopril; Other; and Simvastatin  Medications: Prior to Admission medications   Medication Sig Start Date End Date Taking? Authorizing Provider  acetaminophen (TYLENOL) 650 MG CR tablet Take 1,300 mg by mouth every 8 (eight) hours as needed for pain.    Yes Historical Provider, MD  ALPRAZolam Duanne Moron) 0.5 MG tablet Take 0.25-0.5 mg by mouth at bedtime as needed for anxiety or sleep.   Yes Historical Provider, MD  aspirin 325 MG tablet Take 1 tablet (325 mg total) by mouth daily. 11/10/14  Yes Hedy Jacob, PA-C  Calcium Carbonate (CALCIUM 500 PO) Take 1 tablet by mouth 2 (two) times daily.   Yes Historical Provider, MD  celecoxib (CELEBREX) 200 MG capsule Take 200 mg by mouth daily as needed for mild pain.   Yes Historical Provider, MD  cetirizine (ZYRTEC) 10 MG tablet Take 10 mg by mouth every morning.    Yes Historical Provider, MD  Cholecalciferol (VITAMIN D3) 2000 UNITS TABS Take 1 tablet by mouth every morning.    Yes Historical Provider, MD  clopidogrel (PLAVIX) 75 MG tablet Take 1 tablet (75 mg total) by mouth daily. 06/01/15  Yes Ascencion Dike, PA-C  estradiol (VIVELLE-DOT) 0.05 MG/24HR patch Place 1 patch onto the skin 2 (two) times a week. 04/21/15  Yes Historical Provider, MD  famotidine (PEPCID) 40 MG tablet Take 40 mg by mouth daily.   Yes Historical Provider, MD  FERROUS SULFATE PO Take 1 tablet by mouth daily.    Yes Historical Provider, MD  gabapentin (NEURONTIN) 300 MG capsule Take 300 mg by mouth 2 (two) times daily.    Yes Historical Provider, MD  Loperamide HCl (IMODIUM PO) Take 1 tablet by mouth daily.    Yes Historical Provider, MD  losartan (COZAAR) 50 MG tablet Take 50 mg by mouth daily.  11/15/14  Yes  Historical Provider, MD  Magnesium 250 MG TABS Take 1 tablet by mouth daily.   Yes Historical Provider, MD  potassium chloride (KLOR-CON 10) 10 MEQ tablet Take 1 tablet (10 mEq total) by mouth 2 (two) times daily. Patient taking differently: Take 10 mEq by mouth daily.  09/15/13  Yes Janith Lima, MD  rosuvastatin (CRESTOR) 20 MG tablet Take 1 tablet (20 mg total) by mouth daily. 09/16/13  Yes Janith Lima, MD  traZODone (DESYREL) 100 MG tablet Take 50 mg by mouth at bedtime.    Yes Historical Provider, MD  cycloSPORINE (RESTASIS) 0.05 % ophthalmic emulsion Place 1 drop into both eyes 2 (two) times daily as needed (dry eyes).     Historical Provider, MD  fluticasone (FLONASE) 50 MCG/ACT nasal spray Place 1 spray into both nostrils daily as needed for allergies or rhinitis.     Historical Provider, MD  metoprolol (LOPRESSOR) 50 MG tablet Take 1 tablet (50 mg total) by mouth as needed. Patient taking differently: Take 50 mg by mouth daily as needed.  09/15/13   Janith Lima, MD  SUMAtriptan (IMITREX) 50 MG tablet Take 50 mg by mouth every 2 (two) hours as needed for migraine. May repeat in 2 hours if headache persists or recurs.    Historical Provider, MD     Vital Signs: BP 127/63   Pulse 66   Temp 97 F (36.1 C)  Resp 11   Ht 5\' 4"  (1.626 m)   Wt 164 lb 0.4 oz (74.4 kg)   SpO2 100%   BMI 28.15 kg/m   Physical Exam Awake and alert Still laying flat Right femoral artery cath site looks good, no hematoma or pseudoaneurysm Palpable left radial pulse.  Imaging: No results found.  Labs:  CBC:  Recent Labs  06/01/15 0411 06/21/15 1345 12/24/15 0930 01/05/16 0700  WBC 5.6 6.4 4.6 4.9  HGB 10.4* 11.8* 12.1 10.9*  HCT 32.5* 35.7* 37.5 34.3*  PLT 211 230 255 219    COAGS:  Recent Labs  05/27/15 0830 05/31/15 0647 12/24/15 0930 01/05/16 0700  INR 0.98 0.98 0.96 0.98  APTT 30 30 23* 29    BMP:  Recent Labs  05/31/15 0647 06/01/15 0411 09/23/15 1423  12/24/15 0930 01/05/16 0700  NA 140 141  --  140 138  K 4.1 4.8  --  4.5 4.2  CL 105 106  --  107 108  CO2 23 26  --  27 23  GLUCOSE 107* 116*  --  108* 113*  BUN 18 13  --  13 13  CALCIUM 9.3 8.9  --  9.8 9.8  CREATININE 1.03* 0.97 0.90 0.99 1.01*  GFRNONAA 53* 57*  --  56* 55*  GFRAA >60 >60  --  >60 >60    LIVER FUNCTION TESTS:  Recent Labs  05/31/15 0647 01/05/16 0700  BILITOT 0.4 0.5  AST 26 19  ALT 26 15  ALKPHOS 58 47  PROT 5.3* 5.7*  ALBUMIN 3.2* 3.7    Assessment and Plan:  Subclavian artery stenosis = s/p left SCA angioplasty today by Dr. Merrilee Jansky to advance diet when straight leg time is over.  Probable D/C home in am.  Electronically Signed: Murrell Redden PA-C 01/05/2016, 3:16 PM   I spent a total of 15 Minutes at the the patient's bedside AND on the patient's hospital floor or unit, greater than 50% of which was counseling/coordinating care for post op follow up after Subclavian artery angioplasty.

## 2016-01-05 NOTE — Progress Notes (Signed)
ANTICOAGULATION CONSULT NOTE - Initial Consult  Pharmacy Consult for heparin dosing: Indication: Post-interventional neuroradiological procedure  Allergies  Allergen Reactions  . Daypro [Oxaprozin] Swelling    facial  . Lisinopril     cough  . Other Itching    narcotics  . Simvastatin     REACTION: myalgias and fatigue    Patient Measurements: Height: 5\' 4"  (162.6 cm) Weight: 164 lb 0.4 oz (74.4 kg) IBW/kg (Calculated) : 54.7 Heparin Dosing Weight: 67.63kg  Vital Signs: Temp: 97 F (36.1 C) (09/20 1330) Temp Source: Oral (09/20 0815) BP: 127/63 (09/20 1400) Pulse Rate: 66 (09/20 1400)  Labs:  Recent Labs  01/05/16 0700  HGB 10.9*  HCT 34.3*  PLT 219  APTT 29  LABPROT 13.0  INR 0.98  CREATININE 1.01*    Estimated Creatinine Clearance: 50.5 mL/min (by C-G formula based on SCr of 1.01 mg/dL (H)).   Medical History: Past Medical History:  Diagnosis Date  . Anxiety   . Arthritis   . Bleeding tendency ([place]) 05/16/2012   Hx post op bleeding (epidural hematoma s/p ACDF '10; LLQ hematoma due to superficial vessel fascial bleed post colon resection; Normal platelet count; saw Dr. [name] '14  . Colon polyp   . Diverticulitis   . Dysrhythmia    SVT  . Family history of cardiovascular disease   . Fracture of right fibula   . GERD (gastroesophageal reflux disease)   . Headache(784.0)    mirgraines - history of  . HOH (hard of hearing)    wears hearing aids  . HTN (hypertension)   . Hyperlipidemia   . Hypertension   . IBS (irritable bowel syndrome)    followed by Dr. [name]  . IBS (irritable bowel syndrome)   . Liver cyst   . Melanoma ([place])    removed from back 04/2013  . Shortness of breath dyspnea    with exertion  . Stroke ([place]) 06/2012   mild  . SVT (supraventricular tachycardia) ([place])   . Urinary urgency   . UTI (lower urinary tract infection)    x 2 since June 2016    Medications:  Prescriptions Prior to Admission  Medication Sig Dispense  Refill Last Dose  . acetaminophen (TYLENOL) 650 MG CR tablet Take 1,300 mg by mouth every 8 (eight) hours as needed for pain.    Past Week at Unknown time  . ALPRAZolam (XANAX) 0.5 MG tablet Take 0.25-0.5 mg by mouth at bedtime as needed for anxiety or sleep.   Past Week at Unknown time  . aspirin 325 MG tablet Take 1 tablet (325 mg total) by mouth daily. 30 tablet 0 01/05/2016 at 0600  . Calcium Carbonate (CALCIUM 500 PO) Take 1 tablet by mouth 2 (two) times daily.   01/05/2016 at 0600  . celecoxib (CELEBREX) 200 MG capsule Take 200 mg by mouth daily as needed for mild pain.   Past Week at Unknown time  . cetirizine (ZYRTEC) 10 MG tablet Take 10 mg by mouth every morning.    01/04/2016 at Unknown time  . Cholecalciferol (VITAMIN D3) 2000 UNITS TABS Take 1 tablet by mouth every morning.    01/04/2016 at Unknown time  . clopidogrel (PLAVIX) 75 MG tablet Take 1 tablet (75 mg total) by mouth daily. 30 tablet 2 01/05/2016 at 0600  . estradiol (VIVELLE-DOT) 0.05 MG/24HR patch Place 1 patch onto the skin 2 (two) times a week.  3 Past Week at Unknown time  . famotidine (PEPCID) 40 MG tablet Take 40  mg by mouth daily.   01/04/2016 at Unknown time  . FERROUS SULFATE PO Take 1 tablet by mouth daily.    01/04/2016 at Unknown time  . gabapentin (NEURONTIN) 300 MG capsule Take 300 mg by mouth 2 (two) times daily.    01/04/2016 at Unknown time  . Loperamide HCl (IMODIUM PO) Take 1 tablet by mouth daily.    01/04/2016 at Unknown time  . losartan (COZAAR) 50 MG tablet Take 50 mg by mouth daily.   3 01/04/2016 at Unknown time  . Magnesium 250 MG TABS Take 1 tablet by mouth daily.   01/04/2016 at Unknown time  . potassium chloride (KLOR-CON 10) 10 MEQ tablet Take 1 tablet (10 mEq total) by mouth 2 (two) times daily. (Patient taking differently: Take 10 mEq by mouth daily. ) 180 tablet 3 01/05/2016 at 0600  . rosuvastatin (CRESTOR) 20 MG tablet Take 1 tablet (20 mg total) by mouth daily. 90 tablet 3 01/04/2016 at Unknown time  .  traZODone (DESYREL) 100 MG tablet Take 50 mg by mouth at bedtime.    01/04/2016 at Unknown time  . cycloSPORINE (RESTASIS) 0.05 % ophthalmic emulsion Place 1 drop into both eyes 2 (two) times daily as needed (dry eyes).    More than a month at Unknown time  . fluticasone (FLONASE) 50 MCG/ACT nasal spray Place 1 spray into both nostrils daily as needed for allergies or rhinitis.    More than a month at Unknown time  . metoprolol (LOPRESSOR) 50 MG tablet Take 1 tablet (50 mg total) by mouth as needed. (Patient taking differently: Take 50 mg by mouth daily as needed. ) 90 tablet 3 More than a month at Unknown time  . SUMAtriptan (IMITREX) 50 MG tablet Take 50 mg by mouth every 2 (two) hours as needed for migraine. May repeat in 2 hours if headache persists or recurs.   More than a month at Unknown time    Assessment:  Hx of prior CVA and left subclavian artery stenosis. Patient noticed lower BPs in the arm and occasional numbness. Patient was seen in consultation today for crebral arteriogram and received left subclavian artery angioplasty. Patient requires post-procedural heparin dose. CBC is stable.   Goal of Therapy:  Monitor platelets by anticoagulation protocol: Yes Heparin level goal: 0.1-0.25   Plan:  Initiate heparin 600 units/hr. Monitor CBC and heparin level.  Check levels in 8 hrs.  End heparin 7 AM 9/21  Imagene Riches 01/05/2016,2:23 PM

## 2016-01-05 NOTE — Sedation Documentation (Signed)
7 Fr. Exoseal to right groin 

## 2016-01-05 NOTE — Anesthesia Preprocedure Evaluation (Addendum)
Anesthesia Evaluation  Patient identified by MRN, date of birth, ID band Patient awake    Reviewed: Allergy & Precautions, NPO status , Patient's Chart, lab work & pertinent test results  Airway Mallampati: II  TM Distance: >3 FB Neck ROM: Full    Dental  (+) Teeth Intact, Dental Advisory Given   Pulmonary former smoker,    breath sounds clear to auscultation       Cardiovascular hypertension,  Rhythm:Regular Rate:Normal     Neuro/Psych Anxiety CVA    GI/Hepatic GERD  Controlled,  Endo/Other    Renal/GU      Musculoskeletal   Abdominal   Peds  Hematology   Anesthesia Other Findings   Reproductive/Obstetrics                            Anesthesia Physical Anesthesia Plan  ASA: III  Anesthesia Plan: MAC   Post-op Pain Management:    Induction:   Airway Management Planned:   Additional Equipment: Arterial line  Intra-op Plan:   Post-operative Plan:   Informed Consent: I have reviewed the patients History and Physical, chart, labs and discussed the procedure including the risks, benefits and alternatives for the proposed anesthesia with the patient or authorized representative who has indicated his/her understanding and acceptance.   Dental advisory given  Plan Discussed with: CRNA, Anesthesiologist and Surgeon  Anesthesia Plan Comments:        Anesthesia Quick Evaluation

## 2016-01-06 ENCOUNTER — Encounter (HOSPITAL_COMMUNITY): Payer: Self-pay | Admitting: Interventional Radiology

## 2016-01-06 ENCOUNTER — Other Ambulatory Visit: Payer: Self-pay | Admitting: Surgery

## 2016-01-06 DIAGNOSIS — Z23 Encounter for immunization: Secondary | ICD-10-CM | POA: Diagnosis not present

## 2016-01-06 DIAGNOSIS — I658 Occlusion and stenosis of other precerebral arteries: Secondary | ICD-10-CM | POA: Diagnosis not present

## 2016-01-06 LAB — BASIC METABOLIC PANEL
Anion gap: 6 (ref 5–15)
BUN: 7 mg/dL (ref 6–20)
CHLORIDE: 110 mmol/L (ref 101–111)
CO2: 24 mmol/L (ref 22–32)
CREATININE: 0.81 mg/dL (ref 0.44–1.00)
Calcium: 8.7 mg/dL — ABNORMAL LOW (ref 8.9–10.3)
GFR calc Af Amer: >60 mL/min (ref >60)
GFR calc non Af Amer: >60 mL/min (ref >60)
GLUCOSE: 116 mg/dL — AB (ref 65–99)
POTASSIUM: 4.1 mmol/L (ref 3.5–5.1)
Sodium: 140 mmol/L (ref 135–145)

## 2016-01-06 LAB — CBC WITH DIFFERENTIAL/PLATELET
Basophils Absolute: 0 10*3/uL (ref 0.0–0.1)
Basophils Relative: 0 %
EOS ABS: 0.2 10*3/uL (ref 0.0–0.7)
EOS PCT: 4 %
HCT: 31.1 % — ABNORMAL LOW (ref 36.0–46.0)
Hemoglobin: 10 g/dL — ABNORMAL LOW (ref 12.0–15.0)
LYMPHS ABS: 1.2 10*3/uL (ref 0.7–4.0)
LYMPHS PCT: 22 %
MCH: 31.9 pg (ref 26.0–34.0)
MCHC: 32.2 g/dL (ref 30.0–36.0)
MCV: 99.4 fL (ref 78.0–100.0)
MONOS PCT: 9 %
Monocytes Absolute: 0.5 10*3/uL (ref 0.1–1.0)
Neutro Abs: 3.6 10*3/uL (ref 1.7–7.7)
Neutrophils Relative %: 65 %
Platelets: 189 10*3/uL (ref 150–400)
RBC: 3.13 MIL/uL — ABNORMAL LOW (ref 3.87–5.11)
RDW: 12.5 % (ref 11.5–15.5)
WBC: 5.6 10*3/uL (ref 4.0–10.5)

## 2016-01-06 MED ORDER — LOSARTAN POTASSIUM 50 MG PO TABS
50.0000 mg | ORAL_TABLET | Freq: Every day | ORAL | Status: DC
  Administered 2016-01-06: 50 mg via ORAL
  Filled 2016-01-06: qty 1

## 2016-01-06 MED ORDER — CYCLOSPORINE 0.05 % OP EMUL: 1.0000 [drp] | Freq: Two times a day (BID) | OPHTHALMIC | Status: DC | PRN

## 2016-01-06 MED ORDER — VITAMIN D 1000 UNITS PO TABS
2000.0000 [IU] | ORAL_TABLET | Freq: Every morning | ORAL | Status: DC
  Administered 2016-01-06: 2000 [IU] via ORAL
  Filled 2016-01-06: qty 2

## 2016-01-06 MED ORDER — POTASSIUM CHLORIDE CRYS ER 10 MEQ PO TBCR
10.0000 meq | EXTENDED_RELEASE_TABLET | Freq: Two times a day (BID) | ORAL | Status: DC
  Administered 2016-01-06: 10 meq via ORAL
  Filled 2016-01-06 (×2): qty 1

## 2016-01-06 MED ORDER — MAGNESIUM OXIDE 400 (241.3 MG) MG PO TABS
400.0000 mg | ORAL_TABLET | Freq: Every day | ORAL | Status: DC
  Filled 2016-01-06: qty 1

## 2016-01-06 MED ORDER — CELECOXIB 200 MG PO CAPS: 200.0000 mg | ORAL_CAPSULE | Freq: Every day | ORAL | Status: DC | PRN

## 2016-01-06 MED ORDER — FERROUS SULFATE 325 (65 FE) MG PO TABS
325.0000 mg | ORAL_TABLET | Freq: Every day | ORAL | Status: DC
  Filled 2016-01-06: qty 1

## 2016-01-06 MED ORDER — FERROUS SULFATE 300 (60 FE) MG/5ML PO SYRP: 300.0000 mg | ORAL_SOLUTION | Freq: Every day | ORAL | Status: DC

## 2016-01-06 MED ORDER — TRAZODONE HCL 50 MG PO TABS: 50.0000 mg | ORAL_TABLET | Freq: Every day | ORAL | Status: DC

## 2016-01-06 MED ORDER — ROSUVASTATIN CALCIUM 20 MG PO TABS
20.0000 mg | ORAL_TABLET | Freq: Every day | ORAL | Status: DC
  Administered 2016-01-06: 20 mg via ORAL
  Filled 2016-01-06 (×2): qty 1

## 2016-01-06 MED ORDER — GABAPENTIN 300 MG PO CAPS
300.0000 mg | ORAL_CAPSULE | Freq: Two times a day (BID) | ORAL | Status: DC
  Administered 2016-01-06: 300 mg via ORAL
  Filled 2016-01-06: qty 1

## 2016-01-06 MED ORDER — FLUTICASONE PROPIONATE 50 MCG/ACT NA SUSP: 1.0000 | Freq: Every day | NASAL | Status: DC | PRN

## 2016-01-06 MED ORDER — ALPRAZOLAM 0.25 MG PO TABS: 0.2500 mg | ORAL_TABLET | Freq: Every evening | ORAL | Status: DC | PRN

## 2016-01-06 MED ORDER — CALCIUM CARBONATE-VITAMIN D 500-200 MG-UNIT PO TABS
1.0000 | ORAL_TABLET | Freq: Two times a day (BID) | ORAL | Status: DC
  Filled 2016-01-06 (×2): qty 1

## 2016-01-06 MED ORDER — METOPROLOL TARTRATE 50 MG PO TABS: 50.0000 mg | ORAL_TABLET | ORAL | Status: DC | PRN

## 2016-01-06 MED ORDER — HEPARIN (PORCINE) IN NACL 100-0.45 UNIT/ML-% IJ SOLN
600.0000 [IU]/h | INTRAMUSCULAR | Status: AC
  Filled 2016-01-06: qty 250

## 2016-01-06 MED ORDER — FAMOTIDINE 20 MG PO TABS
40.0000 mg | ORAL_TABLET | Freq: Every day | ORAL | Status: DC
  Administered 2016-01-06: 40 mg via ORAL
  Filled 2016-01-06: qty 2

## 2016-01-06 MED ORDER — LORATADINE 10 MG PO TABS
10.0000 mg | ORAL_TABLET | Freq: Every day | ORAL | Status: DC
  Administered 2016-01-06: 10 mg via ORAL
  Filled 2016-01-06: qty 1

## 2016-01-06 NOTE — Discharge Summary (Signed)
Patient ID: Susan Gillespie MRN: [id number] DOB/AGE: August 08, 1945 70 y.o.  Admit date: 01/05/2016 Discharge date: 01/06/2016  Supervising Physician: Luanne Bras  Admission Diagnoses: Left subclavian artery stenosis                                         Recurrent  Discharge Diagnoses:  Active Problems:   Subclavian artery stenosis [facility])   Discharged Condition: stable  Hospital Course: Known Left subclavian artery recurrent stenosis. Original stent placed 11/09/2014. Intra stent stenosis angioplasty 06/02/2015 Now pt having 2-3 months of left arm weakness and fatigue. Feels heavy and tired with almost any movement BP monitoring does reveal discrepancy of 30 points per pt. Cerebral arteriogram 12/24/2015 : IMPRESSION: Approximately 90% plus intrastent stenoses of the stented segment of the proximal left subclavian artery.  Angioplasty of artery was performed 01/05/16 in IR with Dr Estanislado Pandy. Pt tolerated well. No complication. Overnight stay in Neuro ICU without event. Denies headache; denies visual or speech issues. Denies N/V. Eating well; slept okay. Good UOP--- yellow Dr Estanislado Pandy has seen and evaluated patient. Plan for discharge to home For 2 week follow up.  Consults: None  Significant Diagnostic Studies: cerebral arteriogram  Treatments: Procedures:  CEREBRAL ANGIOGRAM [id number] (Custom)]       S/P LT subclavian intrastent angioplsty with a 15mm x 54mm Sterling balloon with a patency of 80 to 85%       Discharge Exam: Blood pressure (!) 151/61, pulse 62, temperature 98.7 F (37.1 C), temperature source Oral, resp. rate 14, height 5\' 4"  (1.626 m), weight 164 lb 0.4 oz (74.4 kg), SpO2 99 %.  PE: A/O; pleasant EOM Face symmetrical Smile= Tongue midline Heart RRR Lungs CTA Abd soft +BS; NT Extr: FROM Good sensation and strength = Rt foot 2+ pulses Rt groin NT no bleeding No hematoma UOP great--yellow  Results for orders placed or performed  during the hospital encounter of 01/05/16  MRSA PCR Screening  Result Value Ref Range   MRSA by PCR NEGATIVE NEGATIVE  APTT  Result Value Ref Range   aPTT 29 24 - 36 seconds  CBC WITH DIFFERENTIAL  Result Value Ref Range   WBC 4.9 4.0 - 10.5 K/uL   RBC 3.44 (L) 3.87 - 5.11 MIL/uL   Hemoglobin 10.9 (L) 12.0 - 15.0 g/dL   HCT 34.3 (L) 36.0 - 46.0 %   MCV 99.7 78.0 - 100.0 fL   MCH 31.7 26.0 - 34.0 pg   MCHC 31.8 30.0 - 36.0 g/dL   RDW 12.5 11.5 - 15.5 %   Platelets 219 150 - 400 K/uL   Neutrophils Relative % 60 %   Neutro Abs 2.9 1.7 - 7.7 K/uL   Lymphocytes Relative 24 %   Lymphs Abs 1.2 0.7 - 4.0 K/uL   Monocytes Relative 11 %   Monocytes Absolute 0.6 0.1 - 1.0 K/uL   Eosinophils Relative 5 %   Eosinophils Absolute 0.2 0.0 - 0.7 K/uL   Basophils Relative 0 %   Basophils Absolute 0.0 0.0 - 0.1 K/uL  Comprehensive metabolic panel  Result Value Ref Range   Sodium 138 135 - 145 mmol/L   Potassium 4.2 3.5 - 5.1 mmol/L   Chloride 108 101 - 111 mmol/L   CO2 23 22 - 32 mmol/L   Glucose, Bld 113 (H) 65 - 99 mg/dL   BUN 13 6 - 20 mg/dL   Creatinine,  Ser 1.01 (H) 0.44 - 1.00 mg/dL   Calcium 9.8 8.9 - 10.3 mg/dL   Total Protein 5.7 (L) 6.5 - 8.1 g/dL   Albumin 3.7 3.5 - 5.0 g/dL   AST 19 15 - 41 U/L   ALT 15 14 - 54 U/L   Alkaline Phosphatase 47 38 - 126 U/L   Total Bilirubin 0.5 0.3 - 1.2 mg/dL   GFR calc non Af Amer 55 (L) >60 mL/min   GFR calc Af Amer >60 >60 mL/min   Anion gap 7 5 - 15  Protime-INR  Result Value Ref Range   Prothrombin Time 13.0 11.4 - 15.2 seconds   INR 0.98   Heparin level (unfractionated)  Result Value Ref Range   Heparin Unfractionated 0.23 (L) 0.30 - 0.70 IU/mL  Basic metabolic panel  Result Value Ref Range   Sodium 140 135 - 145 mmol/L   Potassium 4.1 3.5 - 5.1 mmol/L   Chloride 110 101 - 111 mmol/L   CO2 24 22 - 32 mmol/L   Glucose, Bld 116 (H) 65 - 99 mg/dL   BUN 7 6 - 20 mg/dL   Creatinine, Ser 0.81 0.44 - 1.00 mg/dL   Calcium 8.7 (L)  8.9 - 10.3 mg/dL   GFR calc non Af Amer >60 >60 mL/min   GFR calc Af Amer >60 >60 mL/min   Anion gap 6 5 - 15  CBC WITH DIFFERENTIAL  Result Value Ref Range   WBC 5.6 4.0 - 10.5 K/uL   RBC 3.13 (L) 3.87 - 5.11 MIL/uL   Hemoglobin 10.0 (L) 12.0 - 15.0 g/dL   HCT 31.1 (L) 36.0 - 46.0 %   MCV 99.4 78.0 - 100.0 fL   MCH 31.9 26.0 - 34.0 pg   MCHC 32.2 30.0 - 36.0 g/dL   RDW 12.5 11.5 - 15.5 %   Platelets 189 150 - 400 K/uL   Neutrophils Relative % 65 %   Neutro Abs 3.6 1.7 - 7.7 K/uL   Lymphocytes Relative 22 %   Lymphs Abs 1.2 0.7 - 4.0 K/uL   Monocytes Relative 9 %   Monocytes Absolute 0.5 0.1 - 1.0 K/uL   Eosinophils Relative 4 %   Eosinophils Absolute 0.2 0.0 - 0.7 K/uL   Basophils Relative 0 %   Basophils Absolute 0.0 0.0 - 0.1 K/uL    Disposition: L subclavian artery angioplasty 9/20 in IR with Dr  Estanislado Pandy Has done well overnight No complaints Dr Estanislado Pandy has seen and examined pt She has good understanding of discharge instructions Continue all home meds Plavix 75 mg and ASA 325 mg daily Follow up with dr deveshwar 2 weeks---she will hear from scheduler for time and date  Discharge Instructions    Call MD for:  difficulty breathing, headache or visual disturbances    Complete by:  As directed    Call MD for:  extreme fatigue    Complete by:  As directed    Call MD for:  hives    Complete by:  As directed    Call MD for:  persistant dizziness or light-headedness    Complete by:  As directed    Call MD for:  persistant nausea and vomiting    Complete by:  As directed    Call MD for:  redness, tenderness, or signs of infection (pain, swelling, redness, odor or green/yellow discharge around incision site)    Complete by:  As directed    Call MD for:  severe uncontrolled pain  Complete by:  As directed    Call MD for:  temperature >100.4    Complete by:  As directed    Diet - low sodium heart healthy    Complete by:  As directed    Discharge instructions     Complete by:  As directed    Follow up with Dr Estanislado Pandy 2 weeks- pt will hear from scheduler for time and date; continue all home meds   Discharge wound care:    Complete by:  As directed    May shower tomorrow; keep bandaid on Rt groin daily x 1 week   Driving Restrictions    Complete by:  As directed    No driving x 2 weeks   Increase activity slowly    Complete by:  As directed    Lifting restrictions    Complete by:  As directed    No lifting over 10 lbs x 2 weeks       Medication List    TAKE these medications   acetaminophen 650 MG CR tablet Commonly known as:  TYLENOL Take 1,300 mg by mouth every 8 (eight) hours as needed for pain.   ALPRAZolam 0.5 MG tablet Commonly known as:  XANAX Take 0.25-0.5 mg by mouth at bedtime as needed for anxiety or sleep.   aspirin 325 MG tablet Take 1 tablet (325 mg total) by mouth daily.   CALCIUM 500 PO Take 1 tablet by mouth 2 (two) times daily.   celecoxib 200 MG capsule Commonly known as:  CELEBREX Take 200 mg by mouth daily as needed for mild pain.   cetirizine 10 MG tablet Commonly known as:  ZYRTEC Take 10 mg by mouth every morning.   clopidogrel 75 MG tablet Commonly known as:  PLAVIX Take 1 tablet (75 mg total) by mouth daily.   cycloSPORINE 0.05 % ophthalmic emulsion Commonly known as:  RESTASIS Place 1 drop into both eyes 2 (two) times daily as needed (dry eyes).   estradiol 0.05 MG/24HR patch Commonly known as:  VIVELLE-DOT Place 1 patch onto the skin 2 (two) times a week.   famotidine 40 MG tablet Commonly known as:  PEPCID Take 40 mg by mouth daily.   FERROUS SULFATE PO Take 1 tablet by mouth daily.   fluticasone 50 MCG/ACT nasal spray Commonly known as:  FLONASE Place 1 spray into both nostrils daily as needed for allergies or rhinitis.   gabapentin 300 MG capsule Commonly known as:  NEURONTIN Take 300 mg by mouth 2 (two) times daily.   IMODIUM PO Take 1 tablet by mouth daily.   losartan 50  MG tablet Commonly known as:  COZAAR Take 50 mg by mouth daily.   Magnesium 250 MG Tabs Take 1 tablet by mouth daily.   metoprolol 50 MG tablet Commonly known as:  LOPRESSOR Take 1 tablet (50 mg total) by mouth as needed. What changed:  when to take this   potassium chloride 10 MEQ tablet Commonly known as:  KLOR-CON 10 Take 1 tablet (10 mEq total) by mouth 2 (two) times daily. What changed:  when to take this   rosuvastatin 20 MG tablet Commonly known as:  CRESTOR Take 1 tablet (20 mg total) by mouth daily.   SUMAtriptan 50 MG tablet Commonly known as:  IMITREX Take 50 mg by mouth every 2 (two) hours as needed for migraine. May repeat in 2 hours if headache persists or recurs.   traZODone 100 MG tablet Commonly known as:  DESYREL Take  50 mg by mouth at bedtime.   Vitamin D3 2000 units Tabs Take 1 tablet by mouth every morning.      Follow-up Information    DEVESHWAR, Fritz Pickerel, MD Follow up in 2 week(s).   Specialty:  Interventional Radiology Why:  pt will hear from scheduler for appt time and date; call [phone] if questions or concerns Contact information: [address] [place] [address] [phone]            Electronically Signed: Monia Sabal A 01/06/2016, 10:00 AM   I have spent Greater Than 30 Minutes discharging [place].

## 2016-01-06 NOTE — Progress Notes (Signed)
Referring Physician(s): Dr Antony Contras  Supervising Physician: Luanne Bras  Patient Status:  Inpatient  Chief Complaint:  Left subclavian artery stenosis 9/20: Procedures:  CEREBRAL ANGIOGRAM [id number] (Custom)]       S/P LT subclavian intrastent angioplsty with a 23mm x 36mm Sterling balloon with a patency of 80 to 85%      Subjective:  Has done well overnight Denies headache No N/V Slight back pain form laying still Minimal pain at Rt groin Has eaten  Denies visual or speech issues  Allergies: Daypro [oxaprozin]; Lisinopril; Other; and Simvastatin  Medications: Prior to Admission medications   Medication Sig Start Date End Date Taking? Authorizing Provider  acetaminophen (TYLENOL) 650 MG CR tablet Take 1,300 mg by mouth every 8 (eight) hours as needed for pain.    Yes Historical Provider, MD  ALPRAZolam Duanne Moron) 0.5 MG tablet Take 0.25-0.5 mg by mouth at bedtime as needed for anxiety or sleep.   Yes Historical Provider, MD  aspirin 325 MG tablet Take 1 tablet (325 mg total) by mouth daily. 11/10/14  Yes Hedy Jacob, PA-C  Calcium Carbonate (CALCIUM 500 PO) Take 1 tablet by mouth 2 (two) times daily.   Yes Historical Provider, MD  celecoxib (CELEBREX) 200 MG capsule Take 200 mg by mouth daily as needed for mild pain.   Yes Historical Provider, MD  cetirizine (ZYRTEC) 10 MG tablet Take 10 mg by mouth every morning.    Yes Historical Provider, MD  Cholecalciferol (VITAMIN D3) 2000 UNITS TABS Take 1 tablet by mouth every morning.    Yes Historical Provider, MD  clopidogrel (PLAVIX) 75 MG tablet Take 1 tablet (75 mg total) by mouth daily. 06/01/15  Yes Ascencion Dike, PA-C  estradiol (VIVELLE-DOT) 0.05 MG/24HR patch Place 1 patch onto the skin 2 (two) times a week. 04/21/15  Yes Historical Provider, MD  famotidine (PEPCID) 40 MG tablet Take 40 mg by mouth daily.   Yes Historical Provider, MD  FERROUS SULFATE PO Take 1 tablet by mouth daily.    Yes Historical  Provider, MD  gabapentin (NEURONTIN) 300 MG capsule Take 300 mg by mouth 2 (two) times daily.    Yes Historical Provider, MD  Loperamide HCl (IMODIUM PO) Take 1 tablet by mouth daily.    Yes Historical Provider, MD  losartan (COZAAR) 50 MG tablet Take 50 mg by mouth daily.  11/15/14  Yes Historical Provider, MD  Magnesium 250 MG TABS Take 1 tablet by mouth daily.   Yes Historical Provider, MD  potassium chloride (KLOR-CON 10) 10 MEQ tablet Take 1 tablet (10 mEq total) by mouth 2 (two) times daily. Patient taking differently: Take 10 mEq by mouth daily.  09/15/13  Yes Janith Lima, MD  rosuvastatin (CRESTOR) 20 MG tablet Take 1 tablet (20 mg total) by mouth daily. 09/16/13  Yes Janith Lima, MD  traZODone (DESYREL) 100 MG tablet Take 50 mg by mouth at bedtime.    Yes Historical Provider, MD  cycloSPORINE (RESTASIS) 0.05 % ophthalmic emulsion Place 1 drop into both eyes 2 (two) times daily as needed (dry eyes).     Historical Provider, MD  fluticasone (FLONASE) 50 MCG/ACT nasal spray Place 1 spray into both nostrils daily as needed for allergies or rhinitis.     Historical Provider, MD  metoprolol (LOPRESSOR) 50 MG tablet Take 1 tablet (50 mg total) by mouth as needed. Patient taking differently: Take 50 mg by mouth daily as needed.  09/15/13   Janith Lima, MD  SUMAtriptan (IMITREX) 50 MG tablet Take 50 mg by mouth every 2 (two) hours as needed for migraine. May repeat in 2 hours if headache persists or recurs.    Historical Provider, MD     Vital Signs: BP (!) 151/61   Pulse 62   Temp 97.6 F (36.4 C) (Oral)   Resp 14   Ht 5\' 4"  (1.626 m)   Wt 164 lb 0.4 oz (74.4 kg)   SpO2 99%   BMI 28.15 kg/m   Physical Exam  Constitutional: She is oriented to person, place, and time.  Eyes: EOM are normal.  Neurological: She is alert and oriented to person, place, and time.  Skin: Skin is warm and dry.  Rt groin site is clean and dry Minimally tender No bleeding No hematoma Rt foot 2+ pulses    Psychiatric: She has a normal mood and affect. Her behavior is normal. Judgment and thought content normal.  Nursing note and vitals reviewed.   Imaging: No results found.  Labs:  CBC:  Recent Labs  06/21/15 1345 12/24/15 0930 01/05/16 0700 01/06/16 0629  WBC 6.4 4.6 4.9 5.6  HGB 11.8* 12.1 10.9* 10.0*  HCT 35.7* 37.5 34.3* 31.1*  PLT 230 255 219 189    COAGS:  Recent Labs  05/27/15 0830 05/31/15 0647 12/24/15 0930 01/05/16 0700  INR 0.98 0.98 0.96 0.98  APTT 30 30 23* 29    BMP:  Recent Labs  06/01/15 0411 09/23/15 1423 12/24/15 0930 01/05/16 0700 01/06/16 0629  NA 141  --  140 138 140  K 4.8  --  4.5 4.2 4.1  CL 106  --  107 108 110  CO2 26  --  27 23 24   GLUCOSE 116*  --  108* 113* 116*  BUN 13  --  13 13 7   CALCIUM 8.9  --  9.8 9.8 8.7*  CREATININE 0.97 0.90 0.99 1.01* 0.81  GFRNONAA 57*  --  56* 55* >60  GFRAA >60  --  >60 >60 >60    LIVER FUNCTION TESTS:  Recent Labs  05/31/15 0647 01/05/16 0700  BILITOT 0.4 0.5  AST 26 19  ALT 26 15  ALKPHOS 58 47  PROT 5.3* 5.7*  ALBUMIN 3.2* 3.7    Assessment and Plan:  L SCA pta Doing well Plan for discharge this am To be seen by Dr Estanislado Pandy  Electronically Signed: Monia Sabal A 01/06/2016, 7:50 AM   I spent a total of 15 Minutes at the the patient's bedside AND on the patient's hospital floor or unit, greater than 50% of which was counseling/coordinating care for L SCA pta

## 2016-01-06 NOTE — Progress Notes (Signed)
ANTICOAGULATION CONSULT NOTE - Follow-up Consult  Pharmacy Consult for heparin Indication: Post-interventional neuroradiological procedure  Allergies  Allergen Reactions  . Daypro [Oxaprozin] Swelling    facial  . Lisinopril     cough  . Other Itching    narcotics  . Simvastatin     REACTION: myalgias and fatigue    Patient Measurements: Height: 5\' 4"  (162.6 cm) Weight: 164 lb 0.4 oz (74.4 kg) IBW/kg (Calculated) : 54.7 Heparin Dosing Weight: 67.63kg  Vital Signs: Temp: 97.9 F (36.6 C) (09/20 2000) Temp Source: Oral (09/20 2000) BP: 143/58 (09/20 2300) Pulse Rate: 70 (09/20 2300)  Labs:  Recent Labs  01/05/16 0700 01/05/16 2316  HGB 10.9*  --   HCT 34.3*  --   PLT 219  --   APTT 29  --   LABPROT 13.0  --   INR 0.98  --   HEPARINUNFRC  --  0.23*  CREATININE 1.01*  --     Estimated Creatinine Clearance: 50.5 mL/min (by C-G formula based on SCr of 1.01 mg/dL (H)).  Assessment:  Pt on heparin s/p subclavian artery angioplasty. Heparin level 0.23 (therapeutic for lower goal of 0.1-0.25). No issues noted.  Goal of Therapy:  Monitor platelets by anticoagulation protocol: Yes Heparin level goal: 0.1-0.25   Plan:  Continue heparin at 600 units/hr. No further levels needed as heparin off at 7 AM 9/21  Sherlon Handing, PharmD, BCPS Clinical pharmacist, pager [phone] 01/06/2016,12:04 AM

## 2016-01-06 NOTE — Care Management Note (Signed)
Case Management Note  Patient Details  Name: Susan Gillespie MRN: [id number] Date of Birth: 10-24-1945  Subjective/Objective:   Pt admitted on 01/05/16 s/p Lt subclavian intrastent angioplasty.  PTA, pt independent of ADLS.                Action/Plan: Pt for discharge home today.  No needs identified.    Expected Discharge Date:                  Expected Discharge Plan:  Home/Self Care  In-House Referral:     Discharge planning Services  CM Consult  Post Acute Care Choice:    Choice offered to:     DME Arranged:    DME Agency:     HH Arranged:    [place] Agency:     Status of Service:  Completed, signed off  If discussed at [employer] of Stay Meetings, dates discussed:    Additional Comments:  Ella Bodo, RN 01/06/2016, 10:32 AM

## 2016-01-06 NOTE — Progress Notes (Signed)
Pt being dc'd at this time.  Volunteer escorts pt to vehicle where son is driving her home.  No ss of any acute distress.

## 2016-01-10 ENCOUNTER — Encounter (HOSPITAL_COMMUNITY): Payer: Self-pay | Admitting: Interventional Radiology

## 2016-01-18 ENCOUNTER — Telehealth (HOSPITAL_COMMUNITY): Payer: Self-pay | Admitting: Radiology

## 2016-01-18 ENCOUNTER — Telehealth (HOSPITAL_COMMUNITY): Payer: Self-pay | Admitting: *Deleted

## 2016-01-18 NOTE — Telephone Encounter (Signed)
Patient orginally called today stating that she was having vertigo and at 4am was woke from sleep with nausea.  SHe felt this way for 5-10 minutes then it past.  She reports difference in bp in arms right BP is greater than left.  Per Dr. Estanislado Pandy can come to the ER and  Seek treatment or hecan order CT angio.  Pt wants to have CT angio scheduled. Anderson Malta informed  Informed patient she will have to await insurance approval she understands this.

## 2016-01-18 NOTE — Telephone Encounter (Signed)
Pt called complaining of vertigo, nausea, and diarrhea. I gave the call to Roe Coombs, RN to evaluate her sx and discuss with Deveshwar. JM

## 2016-01-21 ENCOUNTER — Ambulatory Visit (HOSPITAL_COMMUNITY): Admission: RE | Admit: 2016-01-21 | Payer: PPO | Source: Ambulatory Visit

## 2016-02-02 ENCOUNTER — Other Ambulatory Visit (HOSPITAL_COMMUNITY): Payer: Self-pay | Admitting: Interventional Radiology

## 2016-02-02 DIAGNOSIS — R42 Dizziness and giddiness: Secondary | ICD-10-CM

## 2016-02-23 ENCOUNTER — Encounter: Payer: Self-pay | Admitting: Neurology

## 2016-02-23 ENCOUNTER — Ambulatory Visit (HOSPITAL_COMMUNITY): Payer: PPO

## 2016-02-23 ENCOUNTER — Ambulatory Visit (INDEPENDENT_AMBULATORY_CARE_PROVIDER_SITE_OTHER): Payer: PPO | Admitting: Neurology

## 2016-02-23 ENCOUNTER — Encounter (HOSPITAL_COMMUNITY): Payer: Self-pay

## 2016-02-23 VITALS — BP 143/73 | HR 70 | Ht 63.0 in | Wt 163.0 lb

## 2016-02-23 DIAGNOSIS — R42 Dizziness and giddiness: Secondary | ICD-10-CM | POA: Diagnosis not present

## 2016-02-23 NOTE — Progress Notes (Signed)
[facility] [address] [place]. [place] [address] [phone]       OFFICE FOLLOW UP VISIT NOTE  Ms. Jodi Mourning Zirbes Date of Birth:  [birth year] Medical Record Number:  [id number]   Referring MD:  [name]  Reason for Referral:  Headache  HPI: Ms Stump is a pleasant 70 year old lady whose had intermittent recurrent stereotypical episodes of right-sided head abnormal pulsation or rumbling sensation on the right side of the head. This started 3 months ago and she's had multiple episodes in the last episode was 2 weeks ago. This may last for variable period of time from 5-6 minutes to up to 5-6 hours. There are no specific triggers and the episodes can occur at any time or with any activities. She finds annoying though she states it is not painful. She feels the sensation is similar to horses galloping. In only 2 of these episodes she has had headache later but not during the episode. She does have remote history of migraine headaches but feels that she outgrew them during her menopause. She's had however 2 episodes of migraines in the last few months but feels they were unrelated to these other episodes. She denies any competing symptoms in the, speech difficulty, vision problems, vertigo, diplopia, gait, balance problems or focal weakness or numbness. She feels she is not anxious during these episodes though she does take Xanax when necessary about twice a week for an unrelated anxiety. She has history of SVTs but feels she has taken her pulse during these episodes and it is not high. 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 Carotid ultrasound and transthoracic echo were unremarkable. Vascular risk factors identified included hypertension and hyperlipidemia. She had previously been on aspirin which was changed to Plavix but presently  she is on aspirin 81 mg due to easy bruising. She states her blood pressure is well controlled though it is slightly elevated at 148/66 in office today. She is on aspirin 81 mg which is tolerating well. She is also on Crestor and had lipid profile checked a few months ago and it was fine. She is not having any significant myalgias or arthralgias. Update 12/01/2014 : She returns for follow-up after initial consultation with me 2 months ago. She had MRI scan of the brain on 09/25/14 which I personally reviewed showed no acute abnormality. MRA of the brain showed no large vessel intracranial stenosis. 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 These changes were much advanced compared with previous MRA from June 2014. 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 After discussion of risks benefits patient agreed to angioplasty stenting and hence was referred to Dr. Estanislado Pandy who perform this on 11/09/14 uneventfully. Patient has been on aspirin and Plavix since then and is tolerating it well with only minor bruising. She states that hers symptoms of right hemicranial pulsations have since disappeared and she's had no episodes since her stenting. She saw Dr. Estanislado Pandy on 11/24/14 and had platelet reactivity testing done but the results are pending. She was advised to have repeat carotid and subclavian ultrasound in 4 months. Update 02/23/2016 : She returns for follow-up after last visit a year ago. She was doing well until September when she started  having   dizziness  when she went to have her hair washed and also felt left upper extremity was fatiguedand found her left radial pulse to be diminished. She was seen by Dr. 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 She underwent elective angioplasty 01/10/16 for restenosis did well   Initially but a week later she developed positional vertigo again which lasted about a month and has now gradually improved. She  is on aspirin and Plavix and having bruising but no major bleeding. She is also having some bladder spasms which occasionally awaken her from sleep. She states her blood pressure is good today it is 145/73. She does occasionally feel off balance but and leans to the right but has had no falls or injuries.  ROS:   14 system review of systems is positive for   flushing, restless leg, bladder urgency, easy bruising and bleeding and all other systems negative  PMH:  Past Medical History:  Diagnosis Date  . Anxiety   . Arthritis   . Bleeding tendency ([place]) 05/16/2012   Hx post op bleeding (epidural hematoma s/p ACDF '10; LLQ hematoma due to superficial vessel fascial bleed post colon resection; Normal platelet count; saw Dr. [name] '14  . Colon polyp   . Diverticulitis   . Dysrhythmia    SVT  . Family history of cardiovascular disease   . Fracture of right fibula   . GERD (gastroesophageal reflux disease)   . Headache(784.0)    mirgraines - history of  . HOH (hard of hearing)    wears hearing aids  . HTN (hypertension)   . Hyperlipidemia   . Hypertension   . IBS (irritable bowel syndrome)    followed by Dr. [name]  . IBS (irritable bowel syndrome)   . Liver cyst   . Melanoma ([place])    removed from back 04/2013  . Shortness of breath dyspnea    with exertion  . Stroke ([place]) 06/2012   mild  . SVT (supraventricular tachycardia) ([place])   . Urinary urgency   . UTI (lower urinary tract infection)    x 2 since June 2016    Social History:  Social History   Social History  . Marital status: Divorced    Spouse name: N/A  . Number of children: 2  . Years of education: 14   Occupational History  . 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 Smoking status: Former Smoker    Packs/day: 2.00     Years: 22.00    Types: Cigarettes    Quit date: 04/23/1985  . Smokeless tobacco: Never Used  . Alcohol use 2.4 oz/week    4 Glasses of wine per week     Comment: 1 drink a  day  . Drug use: No  . Sexual activity: Yes    Birth control/ protection: Surgical   Other Topics Concern  . Not on file   Social History Narrative   ** Merged History Encounter **       Domestic partner   Regular exercise-no   Right handed   Caffeine use-- 2 cups coffee daily, rare soda    Medications:   Current Outpatient Prescriptions on File Prior to Visit  Medication Sig Dispense Refill  . acetaminophen (TYLENOL) 650 MG CR tablet Take 1,300 mg by mouth every 8 (eight) hours as needed for pain.     Marland Kitchen  ALPRAZolam (XANAX) 0.5 MG tablet Take 0.25-0.5 mg by mouth at bedtime as needed for anxiety or sleep.    Marland Kitchen aspirin 325 MG tablet Take 1 tablet (325 mg total) by mouth daily. 30 tablet 0  . Calcium Carbonate (CALCIUM 500 PO) Take 1 tablet by mouth 2 (two) times daily.    . celecoxib (CELEBREX) 200 MG capsule Take 200 mg by mouth daily as needed for mild pain.    . cetirizine (ZYRTEC) 10 MG tablet Take 10 mg by mouth every morning.     . Cholecalciferol (VITAMIN D3) 2000 UNITS TABS Take 1 tablet by mouth every morning.     . clopidogrel (PLAVIX) 75 MG tablet Take 1 tablet (75 mg total) by mouth daily. 30 tablet 2  . cycloSPORINE (RESTASIS) 0.05 % ophthalmic emulsion Place 1 drop into both eyes 2 (two) times daily as needed (dry eyes).     Marland Kitchen estradiol (VIVELLE-DOT) 0.05 MG/24HR patch Place 1 patch onto the skin 2 (two) times a week.  3  . famotidine (PEPCID) 40 MG tablet Take 40 mg by mouth daily.    Marland Kitchen FERROUS SULFATE PO Take 1 tablet by mouth daily.     . fluticasone (FLONASE) 50 MCG/ACT nasal spray Place 1 spray into both nostrils daily as needed for allergies or rhinitis.     Marland Kitchen gabapentin (NEURONTIN) 300 MG capsule Take 300 mg by mouth 2 (two) times daily.     . Loperamide HCl (IMODIUM PO) Take 1 tablet by  mouth daily.     Marland Kitchen losartan (COZAAR) 50 MG tablet Take 50 mg by mouth daily.   3  . Magnesium 250 MG TABS Take 1 tablet by mouth daily.    . metoprolol (LOPRESSOR) 50 MG tablet Take 1 tablet (50 mg total) by mouth as needed. (Patient taking differently: Take 50 mg by mouth daily as needed. ) 90 tablet 3  . potassium chloride (KLOR-CON 10) 10 MEQ tablet Take 1 tablet (10 mEq total) by mouth 2 (two) times daily. (Patient taking differently: Take 10 mEq by mouth daily. ) 180 tablet 3  . rosuvastatin (CRESTOR) 20 MG tablet Take 1 tablet (20 mg total) by mouth daily. 90 tablet 3  . SUMAtriptan (IMITREX) 50 MG tablet Take 50 mg by mouth every 2 (two) hours as needed for migraine. May repeat in 2 hours if headache persists or recurs.    . traZODone (DESYREL) 100 MG tablet Take 50 mg by mouth at bedtime.      No current facility-administered medications on file prior to visit.     Allergies:   Allergies  Allergen Reactions  . Daypro [Oxaprozin] Swelling    facial  . Lisinopril     cough  . Other Itching    narcotics  . Simvastatin     REACTION: myalgias and fatigue    Physical Exam General: well developed, well nourished middle-aged Caucasian lady, seated, in no evident distress Head: head normocephalic and atraumatic.   Neck: supple with no carotid or supraclavicular bruits Cardiovascular: regular rate and rhythm, no murmurs Musculoskeletal: no deformity Skin:  no rash/petichiae.   Vascular:  Normal pulses all extremities. Adson test negative  Neurologic Exam Mental Status: Awake and fully alert. Oriented to place and time. Recent and remote memory intact. Attention span, concentration and fund of knowledge appropriate. Mood and affect appropriate.  Cranial Nerves: Fundoscopic exam reveals sharp disc margins. Pupils equal, briskly reactive to light. Extraocular movements full without nystagmus. Visual fields full to confrontation. Hearing  intact. Facial sensation intact. Face, tongue,  palate moves normally and symmetrically.  Motor: Normal bulk and tone. Normal strength in all tested extremity muscles. Sensory.: intact to touch , pinprick , position and vibratory sensation.  Coordination: Rapid alternating movements normal in all extremities. Finger-to-nose and heel-to-shin performed accurately bilaterally. Gait and Station: Arises from chair without difficulty. Stance is normal. Gait demonstrates normal stride length and balance . Able to heel, toe and tandem walk without difficulty.  Reflexes: 1+ and symmetric. Toes downgoing.      ASSESSMENT: 88 year lady with transient recurrent episodes of right hemicranial pulsations, rumbling sensation of unclear etiology. She was found to have severe left subclavian origin stenosis status post endovascular revascularization of severe symptomatic stenosis of the left subclavian artery with stent assisted angioplasty  11/09/14  with recurrent symptoms and restenosis requiring repeat angioplasty in September 2017 .   PLAN: I had a long d/w patient about her subclavian recurrent stenosis,vertebro basilar steal symptoms,recurrent angioplasty, risk for recurrent stroke/TIAs, personally independently reviewed imaging studies and stroke evaluation results and answered questions.Continue aspirin 325 mg daily and clopidogrel 75 mg daily  for secondary stroke prevention and maintain strict control of hypertension with blood pressure goal below 130/90, diabetes with hemoglobin A1c goal below 6.5% and lipids with LDL cholesterol goal below 70 mg/dL. I also advised the patient to eat a healthy diet with plenty of whole grains, cereals, fruits and vegetables, exercise regularly and maintain ideal body weight .she was advised to follow-up with Dr. Estanislado Pandy as scheduled. Greater than 50% time during this 25 minute visit was spent on counseling and coordination of care about vertebrobasilar steal, stroke and TIA .Followup in the future with me in  1 year or  call earlier if necessary.Antony Contras, MD Note: This document was prepared with digital dictation and possible smart phrase technology. Any transcriptional errors that result from this process are unintentional.

## 2016-03-07 ENCOUNTER — Ambulatory Visit (HOSPITAL_COMMUNITY)
Admission: RE | Admit: 2016-03-07 | Discharge: 2016-03-07 | Disposition: A | Discharge status: Home / Self Care | Payer: PPO | Source: Ambulatory Visit | Attending: Radiology | Admitting: Radiology

## 2016-03-07 DIAGNOSIS — I771 Stricture of artery: Secondary | ICD-10-CM

## 2016-03-07 DIAGNOSIS — Z1283 Encounter for screening for malignant neoplasm of skin: Secondary | ICD-10-CM | POA: Diagnosis not present

## 2016-03-07 DIAGNOSIS — Z08 Encounter for follow-up examination after completed treatment for malignant neoplasm: Secondary | ICD-10-CM | POA: Diagnosis not present

## 2016-03-07 DIAGNOSIS — Z85820 Personal history of malignant melanoma of skin: Secondary | ICD-10-CM | POA: Diagnosis not present

## 2016-03-07 DIAGNOSIS — I658 Occlusion and stenosis of other precerebral arteries: Secondary | ICD-10-CM | POA: Diagnosis not present

## 2016-03-07 HISTORY — PX: IR GENERIC HISTORICAL: IMG1180011

## 2016-03-08 ENCOUNTER — Ambulatory Visit (HOSPITAL_COMMUNITY): Payer: PPO

## 2016-03-08 ENCOUNTER — Encounter (HOSPITAL_COMMUNITY): Payer: Self-pay | Admitting: Interventional Radiology

## 2016-03-21 ENCOUNTER — Telehealth (HOSPITAL_COMMUNITY): Payer: Self-pay

## 2016-03-21 NOTE — Telephone Encounter (Signed)
Informed pt that Dr. Estanislado Pandy was ok with her BP's and that I would call her in about 3 month's to schedule her ultrasound. Pt agreed with this plan. AW

## 2016-03-27 DIAGNOSIS — I771 Stricture of artery: Secondary | ICD-10-CM | POA: Diagnosis not present

## 2016-03-27 DIAGNOSIS — N951 Menopausal and female climacteric states: Secondary | ICD-10-CM | POA: Diagnosis not present

## 2016-05-02 ENCOUNTER — Encounter (HOSPITAL_COMMUNITY): Payer: Self-pay | Admitting: *Deleted

## 2016-05-04 NOTE — H&P (Signed)
Susan Gillespie  Location: [facility] Patient #: [id number] DOB: 1945/09/14 Single / Language: Cleophus Molt / Race: White Female  History of Present Illness   Patient words: discuss colonoscopy and endoscopy.   The patient is a 71 year old female who presents with a complaint of for colonoscopy.   Her PCP and referring physcian is Dr. Beckie Salts.  She comes by herself.  I did a colonoscopy on her on 02/06/2007. She had a laparoscopically assisted sigmoid colectomy for diverticular disease by Dr. Keturah Barre. Rahmon Heigl in 10/03/2007. She has two things for a GI standpoint. She has has some dysphagia for certain foods, bread and chicken, which tend to hang up. She has had a prior upper endoscopy by Dr. [name] over 10 years ago which she said that she had some benign lesions. I asked about her going back to Dr. [name], but she has requested to stay with our office. Her grandfather and 1st cousin had esophageal cancer. She is unaware of the details of their history. Secondly, she is approaching 10 years from her last colonoscopy and was interested in a screening colonoscopy. She does have some loose stools with certain foods, but no specific bowel change. She also says that she has had IBS her entire life and uses immodium regularly.  Plan: 1) Settle what needs to be done to the left SCA. We will send a note to Dr. Estanislado Pandy for insight into stopping her Plavix for the colonoscopy. 2) Cardiac clearance from Dr. [name]. Though, I have found the myodcardial perfusion from 06/14/2015, which looks good. 3) Then schedule elective screening colonoscopy and upper endoscopy. She has particularly asked for Propofol sedation.  Past Medical History: 1. L SCA stent - angioplasty - 05/31/2015 - Dr. Arlean Hopping Her first diagnosis was in July 2016. She is actually up for further treatment of a stenosis. the BP between the left and right arms are about 30 poinds in the systolic  range. 2. HTN 3. Melanoma - rmeoved 04/2013 4. Cervical laminectomy March 2000 Postoperative epidural hermorrhage 5. A&P repair 1998  6. Lap chole - Nove 2008 - D. Xoey Warmoth 7. On Plavix L SCA stenosis 8. History of SVT She said that she had a normal "stress" test this past winter. In Epic, there is a myocardial perfusion scan, which shows a EF of 63% and no abnormality. She did see Dr. Mare Ferrari until his retirement. Now she is to see Dr. [name], but she has not met her yet. She said that when she gets SVT, she takes lopressor, but it sounds like she uses it as a PRN 9. Small stroke in 2014 She slumped over, has recovered mos to her function. She had blurred, then double vision. She has developed a tremor of her right hand. But she is over most of her symptoms She was seeing Dr. Gretchen Short for neurology, but it looks like she last saw him 12/01/2014.  Social history:  Unmarried Good friend - Avis Epley - has been with her for 15 years She quit work in July 2014. Her last 7 years, she worked at the [employer]   Other Problems Ventura Sellers, [place]; 10/26/2015 3:14 PM) Arthritis  Cerebrovascular Accident  Gastroesophageal Reflux Disease  Hypercholesterolemia  Melanoma  Migraine Headache  Other disease, cancer, significant illness   Past Surgical History Ventura Sellers, CMA; 10/26/2015 3:14 PM) Appendectomy  Breast Augmentation  Bilateral. Colon Polyp Removal - Colonoscopy  Colon Removal - Partial  Foot Surgery  Left. Gallbladder Surgery -  Laparoscopic  Hysterectomy (not due to cancer) - Partial  Spinal Surgery - Neck  Tonsillectomy   Diagnostic Studies History Ventura Sellers, [place]; 10/26/2015 3:14 PM) Colonoscopy  5-10 years ago Mammogram  within last year Pap Smear  1-5 years ago  Allergies Ventura Sellers, CMA; 10/26/2015 3:14 PM) Daypro *ANALGESICS - ANTI-INFLAMMATORY*  Lisinopril  *CHEMICALS*  Simvastatin *CHEMICALS*   Medication History Ventura Sellers, CMA; 10/26/2015 3:21 PM) ALPRAZolam (0.5MG Tablet, Oral) Active. Aspirin (325MG Tablet, Oral) Active. Calcium (500MG Tablet, Oral) Active. Celecoxib (200MG Capsule, Oral) Active. Cetirizine HCl (10MG Tablet, Oral) Active. Vitamin D3 (2000UNIT Tablet, Oral) Active. Clopidogrel Bisulfate (75MG Tablet, Oral) Active. Restasis (0.05% Emulsion, Ophthalmic) Active. Estradiol (0.05MG/24HR Patch Weekly, Transdermal) Active. Gabapentin (300MG Capsule, Oral) Active. Imodium (2MG Capsule, Oral) Active. Losartan Potassium (50MG Tablet, Oral) Active. Magnesium (200MG Tablet, Oral) Active. Metoprolol Tartrate (50MG Tablet, Oral) Active. Klor-Con 10 (10MEQ Tablet ER, Oral) Active. Rosuvastatin Calcium (20MG Tablet, Oral) Active. Imitrex (50MG Tablet, Oral) Active. TraZODone HCl (50MG Tablet, Oral) Active. Medications Reconciled  Social History Ventura Sellers, [place]; 10/26/2015 3:14 PM) Alcohol use  Moderate alcohol use. Caffeine use  Carbonated beverages, Coffee, Tea. No drug use  Tobacco use  Former smoker.  Family History Ventura Sellers, [place]; 10/26/2015 3:14 PM) Cerebrovascular Accident  Father. Heart Disease  Brother, Father, Sister. Heart disease in female family member before age 67  Heart disease in female family member before age 81  Hypertension  Father. Respiratory Condition  Father, Mother.  Pregnancy / Birth History Ventura Sellers, [place]; 10/26/2015 3:14 PM) Age at menarche  77 years. Age of menopause  1-50 Gravida  2 Maternal age  3-25 Para  2    Review of Systems ([place]. Brooks CMA; 10/26/2015 3:14 PM) General Not Present- Appetite Loss, Chills, Fatigue, Fever, Night Sweats, Weight Gain and Weight Loss. Skin Not Present- Change in Wart/Mole, Dryness, Hives, Jaundice, New Lesions, Non-Healing Wounds, Rash and Ulcer. HEENT Present- Hearing Loss,  Seasonal Allergies and Wears glasses/contact lenses. Not Present- Earache, Hoarseness, Nose Bleed, Oral Ulcers, Ringing in the Ears, Sinus Pain, Sore Throat, Visual Disturbances and Yellow Eyes. Respiratory Not Present- Bloody sputum, Chronic Cough, Difficulty Breathing, Snoring and Wheezing. Breast Not Present- Breast Mass, Breast Pain, Nipple Discharge and Skin Changes. Cardiovascular Present- Leg Cramps and Rapid Heart Rate. Not Present- Chest Pain, Difficulty Breathing Lying Down, Palpitations, Shortness of Breath and Swelling of Extremities. Gastrointestinal Present- Difficulty Swallowing. Not Present- Abdominal Pain, Bloating, Bloody Stool, Change in Bowel Habits, Chronic diarrhea, Constipation, Excessive gas, Gets full quickly at meals, Hemorrhoids, Indigestion, Nausea, Rectal Pain and Vomiting. Female Genitourinary Present- Urgency. Not Present- Frequency, Nocturia, Painful Urination and Pelvic Pain. Musculoskeletal Present- Joint Stiffness. Not Present- Back Pain, Joint Pain, Muscle Pain, Muscle Weakness and Swelling of Extremities. Neurological Not Present- Decreased Memory, Fainting, Headaches, Numbness, Seizures, Tingling, Tremor, Trouble walking and Weakness. Psychiatric Not Present- Anxiety, Bipolar, Change in Sleep Pattern, Depression, Fearful and Frequent crying. Endocrine Present- Hot flashes. Not Present- Cold Intolerance, Excessive Hunger, Hair Changes, Heat Intolerance and New Diabetes. Hematology Present- Blood Thinners and Easy Bruising. Not Present- Excessive bleeding, Gland problems, HIV and Persistent Infections.  Vitals [employer] R. Brooks CMA; 10/26/2015 3:13 PM) 10/26/2015 3:13 PM Weight: 162.13 lb Height: 63in Body Surface Area: 1.77 m Body Mass Index: 28.72 kg/m  BP: 122/84 (Sitting, Left Arm, Standard)   Physical Exam  General: Older WN WF alert. HEENT: Normal. Pupils equal.  Neck: Supple. No mass. No thyroid mass. Lymph Nodes: No supraclavicular  or  cervical nodes.  Lungs: Clear to auscultation and symmetric breath sounds. Heart: RRR. No murmur or rub.  Abdomen: Soft. No mass. No tenderness. No hernia. Normal bowel sounds. No abdominal scars.   Extremities: Good strength and ROM in upper and lower extremities.  Neurologic: Grossly intact to motor and sensory function. Psychiatric: Has normal mood and affect. Behavior is normal.    Assessment & Plan  1.  HISTORY OF DIVERTICULOSIS (Z87.19)  Plan:  1) Screening colonscopy. She wants this done with Propofol sedation.  2) The plavix is a problem and would limit removing polyp, etc, if found   Will send letter to Dr. Luanne Bras for input regarding the Plavix. This is non urgent  3) History of SVT - will get cardiac clearance - Dr. Ashok Norris (she was a patient of Dr. Vaughan Browner)  2.  ESOPHAGEAL DYSPHAGIA (R13.14)  Plan:   1) Upper endoscopy at the same time as the colonoscopy  Addendum Note(Jaquanda Wickersham H. Lucia Gaskins MD; 11/01/2015 6:28 PM)  Recieved a note from Dr. [name] - Ms. Hennon is low risk from cardiac standp[place].  Await for follow up from Dr. Estanislado Pandy regarding Plavix.  Addendum Note(Layken Beg H. Lucia Gaskins MD; 01/06/2016 1:48 PM)  I talked to her on the phone. She had her right subclavian angioplasty yesterday by Dr. Garen Grams. She sees him back in 2 weeks and will call us after she sees him to decide when to do the endoscopies.  Addendum Note(Sharri Loya H. Peter Daquila MD; 03/21/2016 11:04 AM)  Pt called to report that she got permission from Dr. Estanislado Pandy to hold her Plavix and Aspirin for 5 days so she can have her endoscopy & colonoscopy.  3. L SCA stent - angioplasty - 05/31/2015 - Dr. Arlean Hopping  Her first diagnosis was in July 2016. 4. HTN 5. Melanoma - rmeoved 04/2013 6. Cervical laminectomy March 2000  Postoperative epidural hermorrhage 7. A&P repair 1998  8. Lap chole - Nove 2008 - D. Giordan Fordham 9. On Plavix L SCA stenosis 10. History of SVT  She said  that she had a normal "stress" test this past winter.  In Epic, there is a myocardial perfusion scan, which shows a EF of 63% and no abnormality.  She did see Dr. Mare Ferrari until his retirement. Now she is to see Dr. [name], but she has not met her yet.  11. Small stroke in 2014 She was seeing Dr. Gretchen Short for neurology, but it looks like she last saw him 12/01/2014.   Alphonsa Overall, MD, [facility] Pager: [phone] Office phone:  [phone]

## 2016-05-05 ENCOUNTER — Encounter (HOSPITAL_COMMUNITY): Payer: Self-pay

## 2016-05-05 ENCOUNTER — Encounter (HOSPITAL_COMMUNITY)
Admission: RE | Disposition: A | Discharge status: Home / Self Care | Payer: Self-pay | Source: Ambulatory Visit | Attending: Surgery

## 2016-05-05 ENCOUNTER — Ambulatory Visit (HOSPITAL_COMMUNITY): Payer: PPO | Admitting: Certified Registered"

## 2016-05-05 ENCOUNTER — Ambulatory Visit (HOSPITAL_COMMUNITY)
Admission: RE | Admit: 2016-05-05 | Discharge: 2016-05-05 | Disposition: A | Discharge status: Home / Self Care | Payer: PPO | Source: Ambulatory Visit | Attending: Surgery | Admitting: Surgery

## 2016-05-05 DIAGNOSIS — K219 Gastro-esophageal reflux disease without esophagitis: Secondary | ICD-10-CM | POA: Diagnosis not present

## 2016-05-05 DIAGNOSIS — Z87891 Personal history of nicotine dependence: Secondary | ICD-10-CM | POA: Insufficient documentation

## 2016-05-05 DIAGNOSIS — Z7902 Long term (current) use of antithrombotics/antiplatelets: Secondary | ICD-10-CM | POA: Diagnosis not present

## 2016-05-05 DIAGNOSIS — Z7982 Long term (current) use of aspirin: Secondary | ICD-10-CM | POA: Insufficient documentation

## 2016-05-05 DIAGNOSIS — I10 Essential (primary) hypertension: Secondary | ICD-10-CM | POA: Insufficient documentation

## 2016-05-05 DIAGNOSIS — Z8673 Personal history of transient ischemic attack (TIA), and cerebral infarction without residual deficits: Secondary | ICD-10-CM | POA: Insufficient documentation

## 2016-05-05 DIAGNOSIS — D649 Anemia, unspecified: Secondary | ICD-10-CM | POA: Diagnosis not present

## 2016-05-05 DIAGNOSIS — Z800 Family history of malignant neoplasm of digestive organs: Secondary | ICD-10-CM | POA: Diagnosis not present

## 2016-05-05 DIAGNOSIS — Z8719 Personal history of other diseases of the digestive system: Secondary | ICD-10-CM | POA: Diagnosis not present

## 2016-05-05 DIAGNOSIS — Z85820 Personal history of malignant melanoma of skin: Secondary | ICD-10-CM | POA: Diagnosis not present

## 2016-05-05 DIAGNOSIS — Z1211 Encounter for screening for malignant neoplasm of colon: Secondary | ICD-10-CM | POA: Diagnosis not present

## 2016-05-05 DIAGNOSIS — K2950 Unspecified chronic gastritis without bleeding: Secondary | ICD-10-CM | POA: Diagnosis not present

## 2016-05-05 DIAGNOSIS — I471 Supraventricular tachycardia: Secondary | ICD-10-CM | POA: Diagnosis not present

## 2016-05-05 DIAGNOSIS — Z9049 Acquired absence of other specified parts of digestive tract: Secondary | ICD-10-CM | POA: Diagnosis not present

## 2016-05-05 DIAGNOSIS — M1990 Unspecified osteoarthritis, unspecified site: Secondary | ICD-10-CM | POA: Insufficient documentation

## 2016-05-05 DIAGNOSIS — K2970 Gastritis, unspecified, without bleeding: Secondary | ICD-10-CM | POA: Diagnosis not present

## 2016-05-05 DIAGNOSIS — Z888 Allergy status to other drugs, medicaments and biological substances status: Secondary | ICD-10-CM | POA: Diagnosis not present

## 2016-05-05 DIAGNOSIS — K5730 Diverticulosis of large intestine without perforation or abscess without bleeding: Secondary | ICD-10-CM | POA: Insufficient documentation

## 2016-05-05 DIAGNOSIS — E7800 Pure hypercholesterolemia, unspecified: Secondary | ICD-10-CM | POA: Insufficient documentation

## 2016-05-05 DIAGNOSIS — R1310 Dysphagia, unspecified: Secondary | ICD-10-CM | POA: Diagnosis not present

## 2016-05-05 HISTORY — PX: COLONOSCOPY WITH PROPOFOL: SHX5780

## 2016-05-05 HISTORY — PX: ESOPHAGOGASTRODUODENOSCOPY (EGD) WITH PROPOFOL: SHX5813

## 2016-05-05 SURGERY — ESOPHAGOGASTRODUODENOSCOPY (EGD) WITH PROPOFOL
Anesthesia: Monitor Anesthesia Care

## 2016-05-05 MED ORDER — PHENYLEPHRINE 40 MCG/ML (10ML) SYRINGE FOR IV PUSH (FOR BLOOD PRESSURE SUPPORT)
PREFILLED_SYRINGE | INTRAVENOUS | Status: AC
  Filled 2016-05-05: qty 10

## 2016-05-05 MED ORDER — LIDOCAINE 2% (20 MG/ML) 5 ML SYRINGE
INTRAMUSCULAR | Status: DC | PRN
  Administered 2016-05-05: 20 mg via INTRAVENOUS
  Administered 2016-05-05: 50 mg via INTRAVENOUS
  Administered 2016-05-05: 100 mg via INTRAVENOUS

## 2016-05-05 MED ORDER — PROPOFOL 10 MG/ML IV BOLUS
INTRAVENOUS | Status: AC
  Filled 2016-05-05: qty 40

## 2016-05-05 MED ORDER — PROPOFOL 10 MG/ML IV BOLUS
INTRAVENOUS | Status: DC | PRN
  Administered 2016-05-05: 50 mg via INTRAVENOUS
  Administered 2016-05-05 (×3): 20 mg via INTRAVENOUS
  Administered 2016-05-05 (×2): 10 mg via INTRAVENOUS
  Administered 2016-05-05: 30 mg via INTRAVENOUS

## 2016-05-05 MED ORDER — PHENYLEPHRINE 40 MCG/ML (10ML) SYRINGE FOR IV PUSH (FOR BLOOD PRESSURE SUPPORT)
PREFILLED_SYRINGE | INTRAVENOUS | Status: DC | PRN
  Administered 2016-05-05: 80 ug via INTRAVENOUS
  Administered 2016-05-05: 120 ug via INTRAVENOUS
  Administered 2016-05-05: 80 ug via INTRAVENOUS
  Administered 2016-05-05: 120 ug via INTRAVENOUS

## 2016-05-05 MED ORDER — PROPOFOL 10 MG/ML IV BOLUS
INTRAVENOUS | Status: AC
Start: 2016-05-05 — End: 2016-05-05
  Filled 2016-05-05: qty 20

## 2016-05-05 MED ORDER — LIDOCAINE 2% (20 MG/ML) 5 ML SYRINGE
INTRAMUSCULAR | Status: AC
  Filled 2016-05-05: qty 5

## 2016-05-05 MED ORDER — LACTATED RINGERS IV SOLN
INTRAVENOUS | Status: DC
  Administered 2016-05-05: 12:00:00 via INTRAVENOUS

## 2016-05-05 MED ORDER — PROPOFOL 500 MG/50ML IV EMUL
INTRAVENOUS | Status: DC | PRN
  Administered 2016-05-05: 100 ug/kg/min via INTRAVENOUS

## 2016-05-05 SURGICAL SUPPLY — 25 items

## 2016-05-05 NOTE — Transfer of Care (Signed)
Immediate Anesthesia Transfer of Care Note  Patient: Susan Gillespie  Procedure(s) Performed: Procedure(s): ESOPHAGOGASTRODUODENOSCOPY (EGD) WITH PROPOFOL (N/A) COLONOSCOPY WITH PROPOFOL (N/A)  Patient Location: PACU  Anesthesia Type:MAC  Level of Consciousness: Patient easily awoken, sedated, comfortable, cooperative, following commands, responds to stimulation.   Airway & Oxygen Therapy: Patient spontaneously breathing, ventilating well, oxygen via simple oxygen mask.  Post-op Assessment: Report given to PACU RN, vital signs reviewed and stable, moving all extremities.   Post vital signs: Reviewed and stable.  Complications: No apparent anesthesia complications Last Vitals:  Vitals:   05/05/16 1213 05/05/16 1341  BP: (!) 163/45   Pulse: 87 76  Resp: 10 19  Temp: 37.4 C     Last Pain:  Vitals:   05/05/16 1213  TempSrc: Oral         Complications: No apparent anesthesia complications

## 2016-05-05 NOTE — Anesthesia Postprocedure Evaluation (Addendum)
Anesthesia Post Note  Patient: Susan Gillespie  Procedure(s) Performed: Procedure(s) (LRB): ESOPHAGOGASTRODUODENOSCOPY (EGD) WITH PROPOFOL (N/A) COLONOSCOPY WITH PROPOFOL (N/A)  Patient location during evaluation: Endoscopy Anesthesia Type: MAC Level of consciousness: awake and alert Pain management: pain level controlled Vital Signs Assessment: post-procedure vital signs reviewed and stable Respiratory status: spontaneous breathing, nonlabored ventilation, respiratory function stable and patient connected to nasal cannula oxygen Cardiovascular status: stable and blood pressure returned to baseline Anesthetic complications: no       Last Vitals:  Vitals:   05/05/16 1213 05/05/16 1341  BP: (!) 163/45   Pulse: 87 76  Resp: 10 19  Temp: 37.4 C     Last Pain:  Vitals:   05/05/16 1213  TempSrc: Oral                 Palyn Scrima,JAMES TERRILL

## 2016-05-05 NOTE — Op Note (Addendum)
05/05/2016  1:41 PM  PATIENT:  Susan Gillespie, 71 y.o., female, MRN: [id number]  PREOP DIAGNOSIS:  Dysphagia, diverticulosis  POSTOP DIAGNOSIS:   Bile gastritis, scattered colonic diverticulosis [photos taken]  PROCEDURE:   Procedure(s):  ESOPHAGOGASTRODUODENOSCOPY (EGD) with gastric wall biopsy,  COLONOSCOPY WITH PROPOFOL  SURGEON:   Alphonsa Overall, M.D.  ANESTHESIA:   MAC  Anesthesiologist: Rica Koyanagi, MD  Monitor Anesthesia Care  SPECIMEN:   Biopsies of gastric mucosa  INDICATIONS FOR PROCEDURE:  [name] is a 71 y.o. (DOB: December 01, 1945) white female whose primary care physician is WEBB, CAROL D, MD and comes for upper endoscopy and colonoscopy.   She has had some dysphagia and has a family history of esophageal cancer.  Secondly, she's had a colectomy on 10/03/2007 for diverticulitis and is about 10 years from her last colonoscopy.   The indications and risks of upper endoscopy and colonoscopy were explained to the patient.  The risks include, but are not limited to, perforation of the bowel and bleeding.  She is on Plavix for vascular disease and has held this for 5 days.  OPERATIVE NOTE:  The patient was taken to room # 3 in the [facility] endoscopy suite.  The patient was monitored by anesthesia and sedation given by anesthesia.  A time out was held and the checklist reviewed.   I did the upper endoscopy first.    A flexible Pentax endoscope was passed down the throat without difficulty.  Findings include:   Esophagus:   Normal   GE junction at:  37 cm   Stomach:  She has evidence of mild gastritis and a fair amount of bile reflux in her stomach.  I did take biopsies of the mid stomach wall for pathology.   Duodenum:   Normal to the 3rd portion of the duodenum.   I then did the colonoscopy.  A digital rectal exam was done at the beginning of the procedure..  The anus and rectum were unremarkable.     The flexible Pentax colonoscope was passed up the rectum without  difficulty.  The scope was advanced to the cecum and the ileocecal valve was identified.  The colonic prep was good.   The right colon, transverse colon, left colon, and sigmoid colon were unremarkable.  I could not identify my prior resection/anastomosis, though this should have been at the 20 cm area.   The scope was withdrawn into the rectum and retroflexed.  The rectum was unremarkable.  Photos were taken during the procedure and placed in the chart.   The patient was taken to the recovery area of the [facility] endoscopy in good condition.  Her boyfriend, Avis Epley, was with the patient and I explained the findings of the procedure.  I gave him copies of the photos.   The patient's next colonoscopy should be in 10 years.  Alphonsa Overall, MD, [facility] Pager: [phone] Office phone:  [phone]

## 2016-05-05 NOTE — Anesthesia Preprocedure Evaluation (Addendum)
Anesthesia Evaluation  Patient identified by MRN, date of birth, ID band Patient awake    Reviewed: Allergy & Precautions, NPO status , Patient's Chart, lab work & pertinent test results  Airway Mallampati: I  TM Distance: >3 FB Neck ROM: Full    Dental  (+) Teeth Intact, Caps, Dental Advisory Given   Pulmonary shortness of breath, former smoker,    breath sounds clear to auscultation       Cardiovascular hypertension, + dysrhythmias  Rhythm:Regular Rate:Normal     Neuro/Psych  Headaches, CVA    GI/Hepatic GERD  ,  Endo/Other    Renal/GU      Musculoskeletal  (+) Arthritis ,   Abdominal   Peds  Hematology  (+) anemia ,   Anesthesia Other Findings   Reproductive/Obstetrics                            Anesthesia Physical Anesthesia Plan  ASA: III  Anesthesia Plan: MAC   Post-op Pain Management:    Induction: Intravenous  Airway Management Planned: Natural Airway and Simple Face Mask  Additional Equipment:   Intra-op Plan:   Post-operative Plan:   Informed Consent: I have reviewed the patients History and Physical, chart, labs and discussed the procedure including the risks, benefits and alternatives for the proposed anesthesia with the patient or authorized representative who has indicated his/her understanding and acceptance.   Dental advisory given  Plan Discussed with: CRNA  Anesthesia Plan Comments:         Anesthesia Quick Evaluation

## 2016-05-05 NOTE — Discharge Instructions (Signed)
[facility] - DISCHARGE INSTRUCTIONS TO PATIENT  Activity:  Driving - Tomorrow   Lifting - No limit  Diet:  As tolerated  Follow up appointment:  Call Dr. Pollie Friar office [facility]) at [phone] for an appointment in as needed.  Medications and dosages:  Resume your home medications.  Call Dr. Lucia Gaskins or his office  [phone]) if you have:  Temperature greater than 100.4,  Persistent nausea and vomiting,  Severe uncontrolled pain,  Redness, tenderness, or signs of infection (pain, swelling, redness, odor or green/yellow discharge around the site),  Any other questions or concerns you may have after discharge.  In an emergency, call 911 or go to an Emergency Department at a nearby hospital.

## 2016-05-05 NOTE — Interval H&P Note (Signed)
History and Physical Interval Note:  05/05/2016 12:45 PM  Susan Gillespie  has presented today for surgery, with the diagnosis of Dysphagia, diverticulosis  The various methods of treatment have been discussed with the patient and family.  [name], with patient.  She has done well with her left subclavian procedure.  After consideration of risks, benefits and other options for treatment, the patient has consented to  Procedure(s): ESOPHAGOGASTRODUODENOSCOPY (EGD) WITH PROPOFOL (N/A) COLONOSCOPY WITH PROPOFOL (N/A) as a surgical intervention .  The patient's history has been reviewed, patient examined, no change in status, stable for surgery.  I have reviewed the patient's chart and labs.  Questions were answered to the patient's satisfaction.     [name]

## 2016-05-08 ENCOUNTER — Encounter (HOSPITAL_COMMUNITY): Payer: Self-pay | Admitting: Surgery

## 2016-05-09 ENCOUNTER — Encounter (HOSPITAL_COMMUNITY): Payer: Self-pay

## 2016-05-09 ENCOUNTER — Emergency Department (HOSPITAL_COMMUNITY): Payer: PPO

## 2016-05-09 ENCOUNTER — Emergency Department (HOSPITAL_COMMUNITY)
Admission: EM | Admit: 2016-05-09 | Discharge: 2016-05-09 | Disposition: A | Discharge status: Home / Self Care | Payer: PPO | Attending: Emergency Medicine | Admitting: Emergency Medicine

## 2016-05-09 DIAGNOSIS — I472 Ventricular tachycardia: Secondary | ICD-10-CM | POA: Diagnosis not present

## 2016-05-09 DIAGNOSIS — Z85820 Personal history of malignant melanoma of skin: Secondary | ICD-10-CM | POA: Diagnosis not present

## 2016-05-09 DIAGNOSIS — I471 Supraventricular tachycardia: Secondary | ICD-10-CM | POA: Diagnosis not present

## 2016-05-09 DIAGNOSIS — R0602 Shortness of breath: Secondary | ICD-10-CM | POA: Diagnosis not present

## 2016-05-09 DIAGNOSIS — Z8673 Personal history of transient ischemic attack (TIA), and cerebral infarction without residual deficits: Secondary | ICD-10-CM | POA: Diagnosis not present

## 2016-05-09 DIAGNOSIS — Z87891 Personal history of nicotine dependence: Secondary | ICD-10-CM | POA: Insufficient documentation

## 2016-05-09 DIAGNOSIS — Z79899 Other long term (current) drug therapy: Secondary | ICD-10-CM | POA: Insufficient documentation

## 2016-05-09 DIAGNOSIS — Z7982 Long term (current) use of aspirin: Secondary | ICD-10-CM | POA: Diagnosis not present

## 2016-05-09 DIAGNOSIS — I10 Essential (primary) hypertension: Secondary | ICD-10-CM | POA: Insufficient documentation

## 2016-05-09 LAB — BASIC METABOLIC PANEL
ANION GAP: 11 (ref 5–15)
BUN: 16 mg/dL (ref 6–20)
CHLORIDE: 103 mmol/L (ref 101–111)
CO2: 23 mmol/L (ref 22–32)
Calcium: 10 mg/dL (ref 8.9–10.3)
Creatinine, Ser: 1.15 mg/dL — ABNORMAL HIGH (ref 0.44–1.00)
GFR calc Af Amer: 54 mL/min — ABNORMAL LOW (ref >60)
GFR calc non Af Amer: 46 mL/min — ABNORMAL LOW (ref >60)
GLUCOSE: 113 mg/dL — AB (ref 65–99)
POTASSIUM: 4.6 mmol/L (ref 3.5–5.1)
Sodium: 137 mmol/L (ref 135–145)

## 2016-05-09 LAB — CBC
HEMATOCRIT: 36.9 % (ref 36.0–46.0)
HEMOGLOBIN: 12.3 g/dL (ref 12.0–15.0)
MCH: 32.1 pg (ref 26.0–34.0)
MCHC: 33.3 g/dL (ref 30.0–36.0)
MCV: 96.3 fL (ref 78.0–100.0)
Platelets: 277 10*3/uL (ref 150–400)
RBC: 3.83 MIL/uL — ABNORMAL LOW (ref 3.87–5.11)
RDW: 12.5 % (ref 11.5–15.5)
WBC: 5.9 10*3/uL (ref 4.0–10.5)

## 2016-05-09 LAB — I-STAT TROPONIN, ED: Troponin i, poc: 0 ng/mL (ref 0.00–0.08)

## 2016-05-09 NOTE — ED Notes (Signed)
Pt ambulatory at DC NAD. VSS.

## 2016-05-09 NOTE — ED Triage Notes (Signed)
Pt is now out of SVT. HR 72, NSR.  Ekg repeated.

## 2016-05-09 NOTE — ED Triage Notes (Signed)
Onset 3:40p pt was taking trash out felt heart racing.  H/o SVT.  HR was 170's at home when pt checked it. C/o shortness of breath.  Talking in complete sentences.  Resp e/u.  Charge nurse [place] notified.  Pt took Metropolol  50 mg @ 3:45p with no change.

## 2016-05-09 NOTE — ED Notes (Signed)
Pt reports having colonoscopy last Friday and not doing much since then. Pt reports today was the first day she felt normal since procedure however had a racing heart after taking the trash out. Pt states she feels fine now and is ready to go home.

## 2016-05-09 NOTE — ED Provider Notes (Signed)
[facility] DEPT Provider Note   CSN: [id number] Arrival date & time: 05/09/16  1640     History   Chief Complaint No chief complaint on file.   HPI Susan Gillespie is a 71 y.o. female.  HPI The patient is a 71 year old female with a past medical history of paroxysmal supraventricular tachycardia on metoprolol, prior CVA, hypertension, hyperlipidemia, chronic dyspnea on exertion and history of subclavian steal syndrome status post stent who presents after an episode of supraventricular tachycardia. Patient states that she was in her usual state of health until several days ago where she had a bowel prep for colonoscopy. The colonoscopy went without complication but following the procedure the patient felt fatigued and general malaise. She has had multiple episodes of loose stool. She also reports a cough and sinus congestion in the last several days. Today she woke up feeling great and was able to get a lot of work done around the house. However, around 3:30 this afternoon the patient noticed that she became acutely short of breath and extremely fatigued. At this time she knew she went into super ventricular tachycardia and took metoprolol 50 mg 1. She then presented to the emergency department where initial EKG was consistent with supraventricular tachycardia. Patient then converted to normal sinus rhythm. She is currently asymptomatic. She has no additional acute complaints or concerns today. Past Medical History:  Diagnosis Date  . Anxiety   . Arthritis   . Bleeding tendency ([place]) 05/16/2012   Hx post op bleeding (epidural hematoma s/p ACDF '10; LLQ hematoma due to superficial vessel fascial bleed post colon resection; Normal platelet count; saw Dr. [name] '14  . Colon polyp   . Diverticulitis   . Dysrhythmia    SVT  . Family history of cardiovascular disease   . Fracture of right fibula   . GERD (gastroesophageal reflux disease)   . Headache(784.0)    mirgraines - history of   . HOH (hard of hearing)    wears hearing aids  . HTN (hypertension)   . Hyperlipidemia   . Hypertension   . IBS (irritable bowel syndrome)    followed by Dr. [name]  . IBS (irritable bowel syndrome)   . Liver cyst   . Melanoma ([place])    removed from back 04/2013  . Shortness of breath dyspnea    with exertion  . Stroke ([place]) 06/2012   mild  . SVT (supraventricular tachycardia) ([place])   . Urinary urgency   . UTI (lower urinary tract infection)    x 2 since June 2016    Patient Active Problem List   Diagnosis Date Noted  . 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[place]) 01/05/2016  . Chest discomfort 05/21/2015  . Subclavian artery stenosis, left ([place]) 11/09/2014  . Stenosis of artery ([place])   . Headache disorder 09/21/2014  . Pernicious anemia 09/15/2013  . Routine general medical examination at a health care facility 09/15/2013  . B12 deficiency anemia 09/15/2013  . GAD (generalized anxiety disorder) 12/20/2012  . Spinal stenosis of lumbar region 07/05/2012  . CVA (cerebral infarction) 06/28/2012  . Bleeding tendency ([place]) 05/16/2012  . Paroxysmal supraventricular tachycardia ([place]) 09/25/2011  . Benign hypertensive heart disease without heart failure 12/02/2010  . Essential hypertension, benign 08/22/2010  . Prediabetes 04/28/2009  . Hyperlipidemia LDL goal < 100 12/16/2008  . GERD 12/16/2008  . OSTEOARTHRITIS 12/16/2008    Past Surgical History:  Procedure Laterality Date  . ABDOMINAL HYSTERECTOMY    . ANGIOPLASTY     stent in  subclavian LEFT 2016, angioplasty of stent 2017  . APPENDECTOMY    . BREAST SURGERY     BIL breast augmentation  . CHOLECYSTECTOMY    . COLON SURGERY     Sigmoid Colectomy, returned for post op bleeding  . COLONOSCOPY W/ POLYPECTOMY    . COLONOSCOPY WITH PROPOFOL N/A 05/05/2016   Procedure: COLONOSCOPY WITH PROPOFOL;  Surgeon: Alphonsa Overall, MD;  Location: [facility];  Service: General;  Laterality: N/A;  . ESOPHAGOGASTRODUODENOSCOPY (EGD) WITH PROPOFOL  N/A 05/05/2016   Procedure: ESOPHAGOGASTRODUODENOSCOPY (EGD) WITH PROPOFOL;  Surgeon: Alphonsa Overall, MD;  Location: [facility];  Service: General;  Laterality: N/A;  . EYE SURGERY     eyelid drooping fixed  . IR GENERIC HISTORICAL  12/24/2015   IR ANGIO INTRA EXTRACRAN SEL COM CAROTID INNOMINATE BILAT MOD SED 12/24/2015 Luanne Bras, MD [facility]  . IR GENERIC HISTORICAL  12/24/2015   IR ANGIOGRAM EXTREMITY LEFT 12/24/2015 Luanne Bras, MD [facility]  . IR GENERIC HISTORICAL  12/24/2015   IR ANGIO VERTEBRAL SEL VERTEBRAL UNI R MOD SED 12/24/2015 Luanne Bras, MD [facility]  . IR GENERIC HISTORICAL  01/05/2016   IR PTA NON CORO-LOWER EXTREM 01/05/2016 Luanne Bras, MD [facility]  . IR GENERIC HISTORICAL  03/07/2016   IR RADIOLOGIST EVAL & MGMT 03/07/2016 [facility]  . JOINT REPLACEMENT Left    thumb  . MASTOID DEBRIDEMENT    . MASTOIDECTOMY REVISION    . RADIOLOGY WITH ANESTHESIA N/A 11/09/2014   Procedure: STENT PLACEMENT;  Surgeon: Luanne Bras, MD;  Location: [facility];  Service: Radiology;  Laterality: N/A;  . RADIOLOGY WITH ANESTHESIA N/A 05/31/2015   Procedure: RADIOLOGY WITH ANESTHESIA;  Surgeon: Luanne Bras, MD;  Location: [facility];  Service: Radiology;  Laterality: N/A;  . RADIOLOGY WITH ANESTHESIA N/A 01/05/2016   Procedure: RADIOLOGY WITH ANESTHESIA ANGIOPLASTY WITH STENTNG;  Surgeon: Luanne Bras, MD;  Location: [facility];  Service: Radiology;  Laterality: N/A;  . skin cancer excised  04/2013   Melonoma  . SPINE SURGERY     cervical fusion, returned to OR for post op  bleeding  . TONSILLECTOMY    . TONSILLECTOMY    . TUBAL LIGATION    . VAGINA SURGERY     anterior posterior repair    OB History    No data available       Home Medications    Prior to Admission medications   Medication Sig Start Date End Date Taking? Authorizing Provider  acetaminophen (TYLENOL) 650 MG CR tablet Take 650-1,300 mg by mouth every 8 (eight) hours as needed for  pain.    Yes Historical Provider, MD  ALPRAZolam Duanne Moron) 0.5 MG tablet Take 0.25 mg by mouth at bedtime as needed for anxiety or sleep.    Yes Historical Provider, MD  aspirin 325 MG tablet Take 1 tablet (325 mg total) by mouth daily. 11/10/14  Yes Hedy Jacob, PA-C  Calcium Carb-Cholecalciferol (CALCIUM 500+D3) 500-400 MG-UNIT TABS Take 1 tablet by mouth 2 (two) times daily.   Yes Historical Provider, MD  celecoxib (CELEBREX) 200 MG capsule Take 200 mg by mouth daily as needed for mild pain.   Yes Historical Provider, MD  cetirizine (ZYRTEC) 10 MG tablet Take 10 mg by mouth every morning.    Yes Historical Provider, MD  Cholecalciferol (VITAMIN D3) 2000 UNITS TABS Take 1 tablet by mouth every morning.    Yes Historical Provider, MD  clopidogrel (PLAVIX) 75 MG tablet Take 1 tablet (75 mg total) by  mouth daily. 06/01/15  Yes Ascencion Dike, PA-C  cycloSPORINE (RESTASIS) 0.05 % ophthalmic emulsion Place 1 drop into both eyes 2 (two) times daily as needed (dry eyes).    Yes Historical Provider, MD  estradiol (VIVELLE-DOT) 0.05 MG/24HR patch Place 1 patch onto the skin every Wednesday.  04/21/15  Yes Historical Provider, MD  famotidine (PEPCID) 40 MG tablet Take 40 mg by mouth at bedtime.    Yes Historical Provider, MD  ferrous sulfate 325 (65 FE) MG EC tablet Take 325 mg by mouth daily.   Yes Historical Provider, MD  loperamide (IMODIUM A-D) 2 MG tablet Take 2 mg by mouth daily.   Yes Historical Provider, MD  losartan (COZAAR) 50 MG tablet Take 50 mg by mouth 2 (two) times daily.  11/15/14  Yes Historical Provider, MD  Magnesium 250 MG TABS Take 250 mg by mouth daily.    Yes Historical Provider, MD  metoprolol (LOPRESSOR) 50 MG tablet Take 1 tablet (50 mg total) by mouth as needed. Patient taking differently: Take 50 mg by mouth daily as needed.  09/15/13  Yes Janith Lima, MD  potassium chloride (KLOR-CON 10) 10 MEQ tablet Take 1 tablet (10 mEq total) by mouth 2 (two) times daily. Patient taking  differently: Take 10 mEq by mouth daily.  09/15/13  Yes Janith Lima, MD  rosuvastatin (CRESTOR) 20 MG tablet Take 1 tablet (20 mg total) by mouth daily. Patient taking differently: Take 20 mg by mouth at bedtime.  09/16/13  Yes Janith Lima, MD  SUMAtriptan (IMITREX) 50 MG tablet Take 50 mg by mouth every 2 (two) hours as needed for migraine. May repeat in 2 hours if headache persists or recurs.   Yes Historical Provider, MD  traZODone (DESYREL) 100 MG tablet Take 50 mg by mouth at bedtime.    Yes Historical Provider, MD  vitamin B-12 (CYANOCOBALAMIN) 1000 MCG tablet Take 1,000 mcg by mouth daily.   Yes Historical Provider, MD  vitamin C (ASCORBIC ACID) 500 MG tablet Take 500 mg by mouth daily.   Yes Historical Provider, MD    Family History Family History  Problem Relation Age of Onset  . Hyperlipidemia Other   . Hypertension Other   . Cancer Other     lung, esophagus, stomach  . Stroke Other   . Heart disease Father   . Heart disease Sister   . Heart attack Sister   . Heart disease Brother   . Heart attack Brother   . Stroke Son     Social History Social History  Substance Use Topics  . Smoking status: Former Smoker    Packs/day: 2.00    Years: 22.00    Types: Cigarettes    Quit date: 04/23/1985  . Smokeless tobacco: Never Used  . Alcohol use 2.4 oz/week    4 Glasses of wine per week     Comment: 1 drink a  day     Allergies   Daypro [oxaprozin]; Lisinopril; Other; and Simvastatin   Review of Systems Review of Systems  All other systems reviewed and are negative.    Physical Exam Updated Vital Signs BP 149/65 (BP Location: Right Arm)   Pulse 71   Temp 97.7 F (36.5 C) (Oral)   Resp 12   Ht 5\' 3"  (1.6 m)   Wt 72.6 kg   SpO2 100%   BMI 28.34 kg/m   Physical Exam  Constitutional: She is oriented to person, place, and time. She appears well-developed and well-nourished.  HENT:  Head: Normocephalic and atraumatic.  Cardiovascular: Normal rate and regular  rhythm.  Exam reveals no gallop and no friction rub.   No murmur heard. Pulmonary/Chest: Effort normal and breath sounds normal. No respiratory distress. She has no wheezes. She has no rales.  Abdominal: Soft. Bowel sounds are normal. She exhibits no distension.  Musculoskeletal: She exhibits no edema.  Neurological: She is alert and oriented to person, place, and time.     ED Treatments / Results  Labs (all labs ordered are listed, but only abnormal results are displayed) Labs Reviewed  BASIC METABOLIC PANEL - Abnormal; Notable for the following:       Result Value   Glucose, Bld 113 (*)    Creatinine, Ser 1.15 (*)    GFR calc non Af Amer 46 (*)    GFR calc Af Amer 54 (*)    All other components within normal limits  CBC - Abnormal; Notable for the following:    RBC 3.83 (*)    All other components within normal limits  I-STAT TROPOININ, ED    EKG  EKG Interpretation  Date/Time:  Tuesday May 09 2016 16:57:08 EST Ventricular Rate:  72 PR Interval:  138 QRS Duration: 66 QT Interval:  374 QTC Calculation: 409 R Axis:   58 Text Interpretation:  Normal sinus rhythm Normal ECG previous EKG earlier in the day showed SVT Confirmed by YAO  MD, DAVID ([id number]) on 05/09/2016 6:36:03 PM       Radiology Dg Chest 2 View  Result Date: 05/09/2016 CLINICAL DATA:  Acute onset of shortness of breath and supraventricular tachycardia. Initial encounter. EXAM: CHEST  2 VIEW COMPARISON:  Chest radiograph performed 02/10/2014 FINDINGS: The lungs are well-aerated and clear. There is no evidence of focal opacification, pleural effusion or pneumothorax. The heart is borderline normal in size. No acute osseous abnormalities are seen. Cervical spinal fusion hardware is noted. Clips are noted within the right upper quadrant, reflecting prior cholecystectomy. IMPRESSION: No acute cardiopulmonary process seen. Electronically Signed   By: Garald Balding M.D.   On: 05/09/2016 17:57     Procedures Procedures (including critical care time)  Medications Ordered in ED Medications - No data to display   Initial Impression / Assessment and Plan / ED Course  I have reviewed the triage vital signs and the nursing notes.  Pertinent labs & imaging results that were available during my care of the patient were reviewed by me and considered in my medical decision making (see chart for details).     The patient is a 71 year old female who presents after a paroxysmal episode of supraventricular tachycardia. Patient has formal diagnosis of paroxysmal superventricular tachycardia and takes metoprolol 1 symptoms come on. She took metoprolol 50 mg 1 today and is converted to normal sinus rhythm. She is currently asymptomatic and hemodynamically stable. Initial laboratory evaluation shows an acute kidney injury with a creatinine of 1.15. I will instructed the patient to hold her angiotensin receptor blocker for 1 week and to drink plenty of fluids and stay well hydrated. Initial i-STAT troponin negative. CBC unremarkable. Most recent EKG shows normal sinus rhythm. At this time the patient is appropriate for discharge. She can continue with when necessary metoprolol as instructed by her cardiologist. She'll benefit from follow-up with her primary care physician. Return precautions discussed.  Final Clinical Impressions(s) / ED Diagnoses   Final diagnoses:  Paroxysmal SVT (supraventricular tachycardia) (HCC)    New Prescriptions New Prescriptions   No medications on file  Ophelia Shoulder, MD 05/09/16 2016    Drenda Freeze, MD 05/11/16 [phone]

## 2016-05-09 NOTE — Discharge Instructions (Signed)
Please continue to take your metoprolol as prescribed.  I advised not to take your losartan for approximately one week. During this time please ensure you drink plenty of fluids and stay extremely hydrated. After this one week you may resume losartan for the treatment of her blood pressure.  I also suggest you follow up with either your primary care physician or your cardiologist.

## 2016-06-01 ENCOUNTER — Other Ambulatory Visit (HOSPITAL_COMMUNITY): Payer: Self-pay | Admitting: Interventional Radiology

## 2016-06-01 DIAGNOSIS — I771 Stricture of artery: Secondary | ICD-10-CM

## 2016-06-08 ENCOUNTER — Other Ambulatory Visit (HOSPITAL_COMMUNITY): Payer: Self-pay | Admitting: Radiology

## 2016-06-08 ENCOUNTER — Ambulatory Visit (HOSPITAL_COMMUNITY)
Admission: RE | Admit: 2016-06-08 | Discharge: 2016-06-08 | Disposition: A | Discharge status: Home / Self Care | Payer: PPO | Source: Ambulatory Visit | Attending: Family Medicine | Admitting: Family Medicine

## 2016-06-08 DIAGNOSIS — I6523 Occlusion and stenosis of bilateral carotid arteries: Secondary | ICD-10-CM | POA: Insufficient documentation

## 2016-06-08 DIAGNOSIS — I771 Stricture of artery: Secondary | ICD-10-CM | POA: Insufficient documentation

## 2016-06-08 DIAGNOSIS — I639 Cerebral infarction, unspecified: Secondary | ICD-10-CM

## 2016-06-08 LAB — VAS US CAROTID
LCCAPDIAS: 19 cm/s
LEFT ECA DIAS: -25 cm/s
LICADDIAS: -18 cm/s
LICADSYS: -89 cm/s
Left CCA dist dias: -13 cm/s
Left CCA dist sys: -77 cm/s
Left CCA prox sys: 123 cm/s
RCCAPDIAS: -13 cm/s
RCCAPSYS: -80 cm/s
RIGHT ECA DIAS: -12 cm/s
RIGHT VERTEBRAL DIAS: -10 cm/s
Right cca dist sys: 94 cm/s

## 2016-06-08 NOTE — Progress Notes (Signed)
**  Preliminary report by tech**  Carotid artery duplex complete. Findings are consistent with a 1-39 percent stenosis involving the right internal carotid artery.  Findings are consistent with a 69 - 79 percent stenosis involving the left internal carotid artery.  The right vertebral artery demonstrates antegrade flow. The left vertebral artery demonstrates bidirectional flow likely due to a subclavian stenosis.  06/08/16 5:06 PM Susan Gillespie RVT

## 2016-06-13 ENCOUNTER — Telehealth (HOSPITAL_COMMUNITY): Payer: Self-pay

## 2016-06-13 NOTE — Telephone Encounter (Signed)
Pt agreed to f/u in 6 months with us carotid. AW 

## 2016-07-04 ENCOUNTER — Other Ambulatory Visit: Payer: Self-pay | Admitting: Family Medicine

## 2016-07-04 DIAGNOSIS — Z1231 Encounter for screening mammogram for malignant neoplasm of breast: Secondary | ICD-10-CM

## 2016-07-17 DIAGNOSIS — H2513 Age-related nuclear cataract, bilateral: Secondary | ICD-10-CM | POA: Diagnosis not present

## 2016-07-21 ENCOUNTER — Ambulatory Visit: Payer: PPO

## 2016-08-08 ENCOUNTER — Ambulatory Visit
Admission: RE | Admit: 2016-08-08 | Discharge: 2016-08-08 | Disposition: A | Discharge status: Home / Self Care | Payer: PPO | Source: Ambulatory Visit | Attending: Family Medicine | Admitting: Family Medicine

## 2016-08-08 DIAGNOSIS — Z1231 Encounter for screening mammogram for malignant neoplasm of breast: Secondary | ICD-10-CM | POA: Diagnosis not present

## 2016-08-31 ENCOUNTER — Ambulatory Visit (INDEPENDENT_AMBULATORY_CARE_PROVIDER_SITE_OTHER): Payer: PPO | Admitting: Cardiology

## 2016-08-31 ENCOUNTER — Encounter: Payer: Self-pay | Admitting: Cardiology

## 2016-08-31 VITALS — BP 138/68 | HR 71 | Ht 63.0 in | Wt 162.0 lb

## 2016-08-31 DIAGNOSIS — I471 Supraventricular tachycardia: Secondary | ICD-10-CM

## 2016-08-31 DIAGNOSIS — E785 Hyperlipidemia, unspecified: Secondary | ICD-10-CM | POA: Diagnosis not present

## 2016-08-31 DIAGNOSIS — I10 Essential (primary) hypertension: Secondary | ICD-10-CM

## 2016-08-31 DIAGNOSIS — I6523 Occlusion and stenosis of bilateral carotid arteries: Secondary | ICD-10-CM

## 2016-08-31 DIAGNOSIS — R0602 Shortness of breath: Secondary | ICD-10-CM | POA: Diagnosis not present

## 2016-08-31 HISTORY — DX: Occlusion and stenosis of bilateral carotid arteries: I65.23

## 2016-08-31 LAB — BASIC METABOLIC PANEL
BUN / CREAT RATIO: 17 (ref 12–28)
BUN: 16 mg/dL (ref 8–27)
CHLORIDE: 101 mmol/L (ref 96–106)
CO2: 23 mmol/L (ref 18–29)
CREATININE: 0.95 mg/dL (ref 0.57–1.00)
Calcium: 10 mg/dL (ref 8.7–10.3)
GFR calc Af Amer: 69 mL/min/{1.73_m2} (ref >59)
GFR calc non Af Amer: 60 mL/min/{1.73_m2} (ref >59)
Glucose: 109 mg/dL — ABNORMAL HIGH (ref 65–99)
Potassium: 4.4 mmol/L (ref 3.5–5.2)
SODIUM: 139 mmol/L (ref 134–144)

## 2016-08-31 LAB — LIPID PANEL
CHOL/HDL RATIO: 1.9 ratio (ref 0.0–4.4)
Cholesterol, Total: 147 mg/dL (ref 100–199)
HDL: 78 mg/dL (ref >39)
LDL CALC: 38 mg/dL (ref 0–99)
Triglycerides: 157 mg/dL — ABNORMAL HIGH (ref 0–149)
VLDL CHOLESTEROL CAL: 31 mg/dL (ref 5–40)

## 2016-08-31 LAB — HEPATIC FUNCTION PANEL
ALBUMIN: 4.4 g/dL (ref 3.5–4.8)
ALK PHOS: 54 IU/L (ref 39–117)
ALT: 17 IU/L (ref 0–32)
AST: 18 IU/L (ref 0–40)
Bilirubin Total: 0.4 mg/dL (ref 0.0–1.2)
Bilirubin, Direct: 0.13 mg/dL (ref 0.00–0.40)
TOTAL PROTEIN: 6.3 g/dL (ref 6.0–8.5)

## 2016-08-31 NOTE — Progress Notes (Signed)
Cardiology Office Note    Date:  08/31/2016   ID:  Susan Gillespie, DOB 02/03/1946, MRN [id number]  PCP:  [name], MD  Cardiologist:  Fransico Him, MD   Chief Complaint  Patient presents with  . Follow-up    PSVT, carotid stenosis, SOB    History of Present Illness:  Susan Gillespie is a 71 y.o. female who presents for followup.   The patient has a past history of paroxysmal supraventricular tachycardia. She also has a history of left subclavian steal. She underwent stenting by Dr. Judi Cong and then repeat angioplasty on 01/05/2016 down to 80-85% patency. Repeat dopplers in 05/2016 showed 1-39% right and 40-59% left carotid stenosis with some evidence of bidirectional flow in the left subclavian artery. She has a family history of CAD and has had CP in the past with normal Nuclear stress test..  She is here for followup and is doing well today.  She denies any chest pain or pressure, PND, orthopnea, LE edema, dizziness or syncope. She has chronic DOE that is stable and unchanged.  She had a breakthrough of SVT in January and went to the ER.  She had another episode in February into the 170's and resolved with metoprolol and then another episode in April and had to take 2 metoprolol.  She says that she has been having more fatigue in her left arm and says that last week she was having problems with more left arm fatigue and achiness when she uses it similar to prior problems with subcalvian steal syndrome and actually lost her pulse in her arm until she rested.   Past Medical History:  Diagnosis Date  . Anxiety   . Arthritis   . Bleeding tendency ([place]) 05/16/2012   Hx post op bleeding (epidural hematoma s/p ACDF '10; LLQ hematoma due to superficial vessel fascial bleed post colon resection; Normal platelet count; saw Dr. [name] '14  . Carotid stenosis, bilateral 08/31/2016   1-39% right and 40-59% left ICA stenosis by dopplers 05/2016  . Colon polyp   . Diverticulitis   .  Dysrhythmia    SVT  . Family history of cardiovascular disease   . Fracture of right fibula   . GERD (gastroesophageal reflux disease)   . Headache(784.0)    mirgraines - history of  . HOH (hard of hearing)    wears hearing aids  . HTN (hypertension)   . Hyperlipidemia   . Hypertension   . IBS (irritable bowel syndrome)    followed by Dr. [name]  . IBS (irritable bowel syndrome)   . Liver cyst   . Melanoma ([place])    removed from back 04/2013  . Shortness of breath dyspnea    with exertion  . Stroke ([place]) 06/2012   mild  . SVT (supraventricular tachycardia) ([place])   . Urinary urgency   . UTI (lower urinary tract infection)    x 2 since June 2016    Past Surgical History:  Procedure Laterality Date  . ABDOMINAL HYSTERECTOMY    . ANGIOPLASTY     stent in subclavian LEFT 2016, angioplasty of stent 2017  . APPENDECTOMY    . BREAST EXCISIONAL BIOPSY Left    benign  . BREAST SURGERY     BIL breast augmentation  . CHOLECYSTECTOMY    . COLON SURGERY     Sigmoid Colectomy, returned for post op bleeding  . COLONOSCOPY W/ POLYPECTOMY    . COLONOSCOPY WITH PROPOFOL N/A 05/05/2016   Procedure: COLONOSCOPY WITH  PROPOFOL;  Surgeon: Alphonsa Overall, MD;  Location: [facility];  Service: General;  Laterality: N/A;  . ESOPHAGOGASTRODUODENOSCOPY (EGD) WITH PROPOFOL N/A 05/05/2016   Procedure: ESOPHAGOGASTRODUODENOSCOPY (EGD) WITH PROPOFOL;  Surgeon: Alphonsa Overall, MD;  Location: [facility];  Service: General;  Laterality: N/A;  . EYE SURGERY     eyelid drooping fixed  . IR GENERIC HISTORICAL  12/24/2015   IR ANGIO INTRA EXTRACRAN SEL COM CAROTID INNOMINATE BILAT MOD SED 12/24/2015 Luanne Bras, MD [facility]  . IR GENERIC HISTORICAL  12/24/2015   IR ANGIOGRAM EXTREMITY LEFT 12/24/2015 Luanne Bras, MD [facility]  . IR GENERIC HISTORICAL  12/24/2015   IR ANGIO VERTEBRAL SEL VERTEBRAL UNI R MOD SED 12/24/2015 Luanne Bras, MD [facility]  . IR GENERIC HISTORICAL  01/05/2016   IR  PTA NON CORO-LOWER EXTREM 01/05/2016 Luanne Bras, MD [facility]  . IR GENERIC HISTORICAL  03/07/2016   IR RADIOLOGIST EVAL & MGMT 03/07/2016 [facility]  . JOINT REPLACEMENT Left    thumb  . MASTOID DEBRIDEMENT    . MASTOIDECTOMY REVISION    . RADIOLOGY WITH ANESTHESIA N/A 11/09/2014   Procedure: STENT PLACEMENT;  Surgeon: Luanne Bras, MD;  Location: [facility];  Service: Radiology;  Laterality: N/A;  . RADIOLOGY WITH ANESTHESIA N/A 05/31/2015   Procedure: RADIOLOGY WITH ANESTHESIA;  Surgeon: Luanne Bras, MD;  Location: [facility];  Service: Radiology;  Laterality: N/A;  . RADIOLOGY WITH ANESTHESIA N/A 01/05/2016   Procedure: RADIOLOGY WITH ANESTHESIA ANGIOPLASTY WITH STENTNG;  Surgeon: Luanne Bras, MD;  Location: [facility];  Service: Radiology;  Laterality: N/A;  . skin cancer excised  04/2013   Melonoma  . SPINE SURGERY     cervical fusion, returned to OR for post op  bleeding  . TONSILLECTOMY    . TONSILLECTOMY    . TUBAL LIGATION    . VAGINA SURGERY     anterior posterior repair    Current Medications: Current Meds  Medication Sig  . acetaminophen (TYLENOL) 650 MG CR tablet Take 650-1,300 mg by mouth every 8 (eight) hours as needed for pain.   Marland Kitchen ALPRAZolam (XANAX) 0.5 MG tablet Take 0.25 mg by mouth at bedtime as needed for anxiety or sleep.   Marland Kitchen aspirin 325 MG tablet Take 1 tablet (325 mg total) by mouth daily.  . Calcium Carb-Cholecalciferol (CALCIUM 500+D3) 500-400 MG-UNIT TABS Take 1 tablet by mouth 2 (two) times daily.  . celecoxib (CELEBREX) 200 MG capsule Take 200 mg by mouth daily as needed for mild pain.  . cetirizine (ZYRTEC) 10 MG tablet Take 10 mg by mouth every morning.   . Cholecalciferol (VITAMIN D3) 2000 UNITS TABS Take 1 tablet by mouth every morning.   . clopidogrel (PLAVIX) 75 MG tablet Take 1 tablet (75 mg total) by mouth daily.  . cycloSPORINE (RESTASIS) 0.05 % ophthalmic emulsion Place 1 drop into both eyes 2 (two) times daily as needed (dry  eyes).   Marland Kitchen estradiol (VIVELLE-DOT) 0.05 MG/24HR patch Place 1 patch onto the skin every Wednesday.   . famotidine (PEPCID) 40 MG tablet Take 40 mg by mouth at bedtime.   . ferrous sulfate 325 (65 FE) MG EC tablet Take 325 mg by mouth daily.  Marland Kitchen loperamide (IMODIUM A-D) 2 MG tablet Take 2 mg by mouth daily.  Marland Kitchen losartan (COZAAR) 50 MG tablet Take 50 mg by mouth 2 (two) times daily.   . Magnesium 250 MG TABS Take 250 mg by mouth daily.   . metoprolol (LOPRESSOR) 50 MG tablet Take  1 tablet (50 mg total) by mouth as needed. (Patient taking differently: Take 50 mg by mouth daily as needed. )  . potassium chloride (KLOR-CON 10) 10 MEQ tablet Take 1 tablet (10 mEq total) by mouth 2 (two) times daily. (Patient taking differently: Take 10 mEq by mouth daily. )  . rosuvastatin (CRESTOR) 20 MG tablet Take 1 tablet (20 mg total) by mouth daily. (Patient taking differently: Take 20 mg by mouth at bedtime. )  . SUMAtriptan (IMITREX) 50 MG tablet Take 50 mg by mouth every 2 (two) hours as needed for migraine. May repeat in 2 hours if headache persists or recurs.  . traZODone (DESYREL) 100 MG tablet Take 50 mg by mouth at bedtime.   . vitamin B-12 (CYANOCOBALAMIN) 1000 MCG tablet Take 1,000 mcg by mouth daily.  . vitamin C (ASCORBIC ACID) 500 MG tablet Take 500 mg by mouth daily.    Allergies:   Daypro [oxaprozin]; Lisinopril; Other; and Simvastatin   Social History   Social History  . Marital status: Divorced    Spouse name: N/A  . Number of children: 2  . Years of education: 14   Occupational History  . 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 Smoking status: Former Smoker    Packs/day: 2.00    Years: 22.00    Types: Cigarettes    Quit date: 04/23/1985  . Smokeless tobacco: Never Used  . Alcohol use 2.4 oz/week    4 Glasses of wine per week     Comment: 1 drink a  day  . Drug use: No  . Sexual activity: Yes    Birth control/ protection:  Surgical   Other Topics Concern  . None   Social History Narrative   ** Merged History Encounter **       Domestic partner   Regular exercise-no   Right handed   Caffeine use-- 2 cups coffee daily, rare soda     Family History:  The patient's family history includes Cancer in her other; Heart attack in her brother and sister; Heart disease in her brother, father, and sister; Hyperlipidemia in her other; Hypertension in her other; Stroke in her other and son.   ROS:   Please see the history of present illness.    ROS All other systems reviewed and are negative.  No flowsheet data found.     PHYSICAL EXAM:   VS:  BP 138/68   Pulse 71   Ht 5\' 3"  (1.6 m)   Wt 162 lb (73.5 kg)   BMI 28.70 kg/m    GEN: Well nourished, well developed, in no acute distress  HEENT: normal  Neck: no JVD, carotid bruits, or masses Cardiac: RRR; no murmurs, rubs, or gallops,no edema.  Intact distal pulses bilaterally.  Respiratory:  clear to auscultation bilaterally, normal work of breathing GI: soft, nontender, nondistended, + BS MS: no deformity or atrophy  Skin: warm and dry, no rash Neuro:  Alert and Oriented x 3, Strength and sensation are intact Psych: euthymic mood, full affect  Wt Readings from Last 3 Encounters:  08/31/16 162 lb (73.5 kg)  05/09/16 160 lb (72.6 kg)  05/05/16 163 lb (73.9 kg)      Studies/Labs Reviewed:   EKG:  EKG is ordered today.  The ekg ordered today demonstrates NSR with no ST changes and normal intervals  Recent Labs: 01/05/2016: ALT 15 05/09/2016: BUN 16; Creatinine, Ser 1.15; Hemoglobin  12.3; Platelets 277; Potassium 4.6; Sodium 137   Lipid Panel    Component Value Date/Time   CHOL 192 09/15/2013 0922   TRIG 194.0 (H) 09/15/2013 0922   HDL 86.80 09/15/2013 0922   CHOLHDL 2 09/15/2013 0922   VLDL 38.8 09/15/2013 0922   LDLCALC 66 09/15/2013 0922    Additional studies/ records that were reviewed today include:  none    ASSESSMENT:    1.  Paroxysmal supraventricular tachycardia ([place])   2. Essential hypertension, benign   3. Hyperlipidemia LDL goal <70   4. Carotid stenosis, bilateral   5. SOB (shortness of breath)      PLAN:  In order of problems listed above:  1. PSVT - she has not had any reoccurence recently. She takes metoprolol tartrate 50mg  PRN for palpitations.  2. HTN - Her BP is adequately controlled on exam today. She will continue on Cozaar.  Creatinine in January 2018 was stable at 1.15. 3. Hyperlipidemia with LDL goal < 70 due to subclavian artery stenosis. She will continue on crestor 20mg  daily.  I will check an FLP and ALT.  4. Bilateral carotid artery stenosis (1-39% right and 40-59% left ICA stenosis with bidirectional flow in the left vertebral due to subclavian stenosis).  This is followed by Dr. Judi Cong.  She has had increased problems with left arm achiness and decreased pulse when she uses her arm.  Her pulse is reduced in her left radial artery.  I have asked her to call Dr. Judi Cong.  She continues on ASA and Plavix.   5. SOB - she is having a lot of problems with exertional SOB.  Her stress test has been normal in the past. I will get a coronary CTA with FFR and calcium score to assess further.     Medication Adjustments/Labs and Tests Ordered: Current medicines are reviewed at length with the patient today.  Concerns regarding medicines are outlined above.  Medication changes, Labs and Tests ordered today are listed in the Patient Instructions below.  There are no Patient Instructions on file for this visit.   Signed, Fransico Him, MD  08/31/2016 8:55 AM    [address], [place], [place]  [address] Phone: [phone]; Fax: [phone]

## 2016-08-31 NOTE — Patient Instructions (Addendum)
Medication Instructions:  Your physician recommends that you continue on your current medications as directed. Please refer to the Current Medication list given to you today.   Labwork: TODAY: BMET, LFTs, Lipids  Testing/Procedures: Dr. [name] recommends you have a CORONARY CT.  Follow-Up: You have been referred to Dr. Curt Bears for SVT.  Your physician wants you to follow-up in: 6 months with Dr. [name]. You will receive a reminder letter in the mail two months in advance. If you don't receive a letter, please call our office to schedule the follow-up appointment.   Any Other Special Instructions Will Be Listed Below (If Applicable).     If you need a refill on your cardiac medications before your next appointment, please call your pharmacy.

## 2016-09-12 DIAGNOSIS — Z08 Encounter for follow-up examination after completed treatment for malignant neoplasm: Secondary | ICD-10-CM | POA: Diagnosis not present

## 2016-09-12 DIAGNOSIS — Z85820 Personal history of malignant melanoma of skin: Secondary | ICD-10-CM | POA: Diagnosis not present

## 2016-09-12 DIAGNOSIS — Z1283 Encounter for screening for malignant neoplasm of skin: Secondary | ICD-10-CM | POA: Diagnosis not present

## 2016-09-15 NOTE — Addendum Note (Signed)
Addendum  created 09/15/16 0941 by Marta Bouie, MD   Sign clinical note    

## 2016-09-25 ENCOUNTER — Ambulatory Visit (INDEPENDENT_AMBULATORY_CARE_PROVIDER_SITE_OTHER): Payer: PPO | Admitting: Cardiology

## 2016-09-25 ENCOUNTER — Encounter: Payer: Self-pay | Admitting: Cardiology

## 2016-09-25 VITALS — BP 120/62 | HR 76 | Ht 63.0 in | Wt 163.6 lb

## 2016-09-25 DIAGNOSIS — E782 Mixed hyperlipidemia: Secondary | ICD-10-CM

## 2016-09-25 DIAGNOSIS — I471 Supraventricular tachycardia: Secondary | ICD-10-CM | POA: Diagnosis not present

## 2016-09-25 DIAGNOSIS — I10 Essential (primary) hypertension: Secondary | ICD-10-CM | POA: Diagnosis not present

## 2016-09-25 MED ORDER — DILTIAZEM HCL ER COATED BEADS 180 MG PO CP24: 180.0000 mg | ORAL_CAPSULE | Freq: Every day | ORAL | 3 refills | Status: DC

## 2016-09-25 MED ORDER — METOPROLOL TARTRATE 50 MG PO TABS: 50.0000 mg | ORAL_TABLET | Freq: Every day | ORAL | 1 refills | Status: DC | PRN

## 2016-09-25 NOTE — Patient Instructions (Signed)
Medication Instructions:   Your physician has recommended you make the following change in your medication:  1) START Diltiazem 180 mg once daily  - If you need a refill on your cardiac medications before your next appointment, please call your pharmacy.   Labwork:  None ordered  Testing/Procedures:  None ordered  Follow-Up:  Your physician wants you to follow-up in: 6 months with Dr. Curt Bears.  You will receive a reminder letter in the mail two months in advance. If you don't receive a letter, please call our office to schedule the follow-up appointment.  Thank you for choosing [facility]!!   Trinidad Curet, RN [phone]  Any Other Special Instructions Will Be Listed Below (If Applicable).  Diltiazem tablets What is this medicine? DILTIAZEM (dil TYE a zem) is a calcium-channel blocker. It affects the amount of calcium found in your heart and muscle cells. This relaxes your blood vessels, which can reduce the amount of work the heart has to do. This medicine is used to treat chest pain caused by angina. This medicine may be used for other purposes; ask your health care provider or pharmacist if you have questions. COMMON BRAND NAME(S): Cardizem What should I tell my health care provider before I take this medicine? They need to know if you have any of these conditions: -heart problems, low blood pressure, irregular heartbeat -liver disease -previous heart attack -an unusual or allergic reaction to diltiazem, other medicines, foods, dyes, or preservatives -pregnant or trying to get pregnant -breast-feeding How should I use this medicine? Take this medicine by mouth with a glass of water. Follow the directions on the prescription label. Do not cut, crush or chew this medicine. This medicine is usually taken before meals and at bedtime. Take your doses at regular intervals. Do not take your medicine more often then directed. Do not stop taking except on the advice of your  doctor or health care professional. Talk to your pediatrician regarding the use of this medicine in children. Special care may be needed. Overdosage: If you think you have taken too much of this medicine contact a poison control center or emergency room at once. NOTE: This medicine is only for you. Do not share this medicine with others. What if I miss a dose? If you miss a dose, take it as soon as you can. If it is almost time for your next dose, take only that dose. Do not take double or extra doses. What may interact with this medicine? Do not take this medicine with any of the following: -cisapride -hawthorn -pimozide -ranolazine -red yeast rice This medicine may also interact with the following medications: -buspirone -carbamazepine -cimetidine -cyclosporine -digoxin -local anesthetics or general anesthetics -lovastatin -medicines for anxiety or difficulty sleeping like midazolam and triazolam -medicines for high blood pressure or heart problems -quinidine -rifampin, rifabutin, or rifapentine This list may not describe all possible interactions. Give your health care provider a list of all the medicines, herbs, non-prescription drugs, or dietary supplements you use. Also tell them if you smoke, drink alcohol, or use illegal drugs. Some items may interact with your medicine. What should I watch for while using this medicine? Check your blood pressure and pulse rate regularly. Ask your doctor or health care professional what your blood pressure and pulse rate should be and when you should contact him or her. You may feel dizzy or lightheaded. Do not drive, use machinery, or do anything that needs mental alertness until you know how this medicine affects  you. To reduce the risk of dizzy or fainting spells, do not sit or stand up quickly, especially if you are an older patient. Alcohol can make you more dizzy or increase flushing and rapid heartbeats. Avoid alcoholic drinks. What side  effects may I notice from receiving this medicine? Side effects that you should report to your doctor or health care professional as soon as possible: -allergic reactions like skin rash, itching or hives, swelling of the face, lips, or tongue -confusion, mental depression -feeling faint or lightheaded, falls -pinpoint red spots on the skin -redness, blistering, peeling or loosening of the skin, including inside the mouth -slow, irregular heartbeat -swelling of the ankles, feet -unusual bleeding or bruising Side effects that usually do not require medical attention (report to your doctor or health care professional if they continue or are bothersome): -change in sex drive or performance -constipation or diarrhea -flushing of the face -headache -nausea, vomiting -tired or weak -trouble sleeping This list may not describe all possible side effects. Call your doctor for medical advice about side effects. You may report side effects to FDA at 1-800-FDA-1088. Where should I keep my medicine? Keep out of the reach of children. Store at room temperature between 20 and 25 degrees C (68 and 77 degrees F). Protect from light. Keep container tightly closed. Throw away any unused medicine after the expiration date. NOTE: This sheet is a summary. It may not cover all possible information. If you have questions about this medicine, talk to your doctor, pharmacist, or health care provider.  2018 Elsevier/Gold Standard (2013-03-17 10:54:31)

## 2016-09-25 NOTE — Progress Notes (Signed)
Electrophysiology Office Note   Date:  09/25/2016   ID:  Susan Gillespie, DOB [birth year], MRN [id number]  PCP:  [name], MD  Cardiologist:  [name] Primary Electrophysiologist:  [name] Meredith [name], MD    Chief Complaint  Patient presents with  . New Patient (Initial Visit)    SVT/SAF  . Shortness of Breath     History of Present Illness: Susan Gillespie is a 71 y.o. female who is being seen today for the evaluation of palpitations at the request of [name], MD. Presenting today for electrophysiology evaluation. She has a history of SVT. She did have breakthrough SVT January the emergency room. She had another episode in February and in the 170s and resolved with metoprolol, and a third episode in April that also resolved after taking 2 doses of metoprolol.  Today, she denies symptoms of palpitations, chest pain, shortness of breath, orthopnea, PND, lower extremity edema, claudication, dizziness, presyncope, syncope, bleeding, or neurologic sequela. The patient is tolerating medications without difficulties.    Past Medical History:  Diagnosis Date  . Anxiety   . Arthritis   . Bleeding tendency ([place]) 05/16/2012   Hx post op bleeding (epidural hematoma s/p ACDF '10; LLQ hematoma due to superficial vessel fascial bleed post colon resection; Normal platelet count; saw Dr. [name] '14  . Carotid stenosis, bilateral 08/31/2016   1-39% right and 40-59% left ICA stenosis by dopplers 05/2016  . Colon polyp   . Diverticulitis   . Dysrhythmia    SVT  . Family history of cardiovascular disease   . Fracture of right fibula   . GERD (gastroesophageal reflux disease)   . Headache(784.0)    mirgraines - history of  . HOH (hard of hearing)    wears hearing aids  . HTN (hypertension)   . Hyperlipidemia   . Hypertension   . IBS (irritable bowel syndrome)    followed by Dr. [name]  . IBS (irritable bowel syndrome)   . Liver cyst   . Melanoma (Susan Gillespie)    removed from back 04/2013    . Shortness of breath dyspnea    with exertion  . Stroke (Susan Gillespie) 06/2012   mild  . SVT (supraventricular tachycardia) ([place])   . Urinary urgency   . UTI (lower urinary tract infection)    x 2 since June 2016   Past Surgical History:  Procedure Laterality Date  . ABDOMINAL HYSTERECTOMY    . ANGIOPLASTY     stent in subclavian LEFT 2016, angioplasty of stent 2017  . APPENDECTOMY    . BREAST EXCISIONAL BIOPSY Left    benign  . BREAST SURGERY     BIL breast augmentation  . CHOLECYSTECTOMY    . COLON SURGERY     Sigmoid Colectomy, returned for post op bleeding  . COLONOSCOPY W/ POLYPECTOMY    . COLONOSCOPY WITH PROPOFOL N/A 05/05/2016   Procedure: COLONOSCOPY WITH PROPOFOL;  Surgeon: Alphonsa Overall, MD;  [facility];  Service: General;  Laterality: N/A;  . ESOPHAGOGASTRODUODENOSCOPY (EGD) WITH PROPOFOL N/A 05/05/2016   Procedure: ESOPHAGOGASTRODUODENOSCOPY (EGD) WITH PROPOFOL;  Surgeon: Alphonsa Overall, MD;  [facility];  Service: General;  Laterality: N/A;  . EYE SURGERY     eyelid drooping fixed  . IR GENERIC HISTORICAL  12/24/2015   IR ANGIO INTRA EXTRACRAN SEL COM CAROTID INNOMINATE BILAT MOD SED 12/24/2015 Luanne Bras, MD [facility]  . IR GENERIC HISTORICAL  12/24/2015   IR ANGIOGRAM EXTREMITY LEFT 12/24/2015 Luanne Bras, MD [facility]  .  IR GENERIC HISTORICAL  12/24/2015   IR ANGIO VERTEBRAL SEL VERTEBRAL UNI R MOD SED 12/24/2015 Luanne Bras, MD [facility]  . IR GENERIC HISTORICAL  01/05/2016   IR PTA NON CORO-LOWER EXTREM 01/05/2016 Luanne Bras, MD [facility]  . IR GENERIC HISTORICAL  03/07/2016   IR RADIOLOGIST EVAL & MGMT 03/07/2016 [facility]  . JOINT REPLACEMENT Left    thumb  . MASTOID DEBRIDEMENT    . MASTOIDECTOMY REVISION    . RADIOLOGY WITH ANESTHESIA N/A 11/09/2014   Procedure: STENT PLACEMENT;  Surgeon: Luanne Bras, MD;  Location: [facility];  Service: Radiology;  Laterality: N/A;  . RADIOLOGY WITH ANESTHESIA N/A  05/31/2015   Procedure: RADIOLOGY WITH ANESTHESIA;  Surgeon: Luanne Bras, MD;  Location: [facility];  Service: Radiology;  Laterality: N/A;  . RADIOLOGY WITH ANESTHESIA N/A 01/05/2016   Procedure: RADIOLOGY WITH ANESTHESIA ANGIOPLASTY WITH STENTNG;  Surgeon: Luanne Bras, MD;  Location: [facility];  Service: Radiology;  Laterality: N/A;  . skin cancer excised  04/2013   Melonoma  . SPINE SURGERY     cervical fusion, returned to OR for post op  bleeding  . TONSILLECTOMY    . TONSILLECTOMY    . TUBAL LIGATION    . VAGINA SURGERY     anterior posterior repair     Current Outpatient Prescriptions  Medication Sig Dispense Refill  . acetaminophen (TYLENOL) 650 MG CR tablet Take 650-1,300 mg by mouth every 8 (eight) hours as needed for pain.     Marland Kitchen ALPRAZolam (XANAX) 0.5 MG tablet Take 0.25 mg by mouth at bedtime as needed for anxiety or sleep.     Marland Kitchen aspirin 325 MG tablet Take 1 tablet (325 mg total) by mouth daily. 30 tablet 0  . Calcium Carb-Cholecalciferol (CALCIUM 500+D3) 500-400 MG-UNIT TABS Take 1 tablet by mouth 2 (two) times daily.    . celecoxib (CELEBREX) 200 MG capsule Take 200 mg by mouth daily as needed for mild pain.    . cetirizine (ZYRTEC) 10 MG tablet Take 10 mg by mouth every morning.     . clopidogrel (PLAVIX) 75 MG tablet Take 1 tablet (75 mg total) by mouth daily. 30 tablet 2  . cycloSPORINE (RESTASIS) 0.05 % ophthalmic emulsion Place 1 drop into both eyes 2 (two) times daily as needed (dry eyes).     Marland Kitchen estradiol (VIVELLE-DOT) 0.05 MG/24HR patch Place 1 patch onto the skin every Wednesday.   3  . famotidine (PEPCID) 40 MG tablet Take 40 mg by mouth at bedtime.     . ferrous sulfate 325 (65 FE) MG EC tablet Take 325 mg by mouth daily.    Marland Kitchen loperamide (IMODIUM A-D) 2 MG tablet Take 2 mg by mouth daily.    Marland Kitchen losartan (COZAAR) 50 MG tablet Take 50 mg by mouth 2 (two) times daily.   3  . Magnesium 250 MG TABS Take 250 mg by mouth daily.     . metoprolol (LOPRESSOR) 50 MG tablet  Take 1 tablet (50 mg total) by mouth as needed. (Patient taking differently: Take 50 mg by mouth daily as needed. ) 90 tablet 3  . potassium chloride (KLOR-CON 10) 10 MEQ tablet Take 1 tablet (10 mEq total) by mouth 2 (two) times daily. (Patient taking differently: Take 10 mEq by mouth daily. ) 180 tablet 3  . rosuvastatin (CRESTOR) 20 MG tablet Take 1 tablet (20 mg total) by mouth daily. (Patient taking differently: Take 20 mg by mouth at bedtime. ) 90 tablet 3  .  SUMAtriptan (IMITREX) 50 MG tablet Take 50 mg by mouth every 2 (two) hours as needed for migraine. May repeat in 2 hours if headache persists or recurs.    . traZODone (DESYREL) 100 MG tablet Take 50 mg by mouth at bedtime.      No current facility-administered medications for this visit.     Allergies:   Daypro [oxaprozin]; Lisinopril; Other; and Simvastatin   Social History:  The patient  reports that she quit smoking about 31 years ago. Her smoking use included Cigarettes. She has a 44.00 pack-year smoking history. She has never used smokeless tobacco. She reports that she drinks about 2.4 oz of alcohol per week . She reports that she does not use drugs.   Family History:  The patient's family history includes Cancer in her other; Heart attack in her brother and sister; Heart disease in her brother, father, and sister; Hyperlipidemia in her other; Hypertension in her other; Stroke in her other and son.    ROS:  Please see the history of present illness.   Otherwise, review of systems is positive for hearing loss, visual changes, DOE, easy bruising.   All other systems are reviewed and negative.    PHYSICAL EXAM: VS:  BP 120/62   Pulse 76   Ht 5\' 3"  (1.6 m)   Wt 163 lb 9.6 oz (74.2 kg)   BMI 28.98 kg/m  , BMI Body mass index is 28.98 kg/m. GEN: Well nourished, well developed, in no acute distress  HEENT: normal  Neck: no JVD, carotid bruits, or masses Cardiac: RRR; no murmurs, rubs, or gallops,no edema  Respiratory:  clear  to auscultation bilaterally, normal work of breathing GI: soft, nontender, nondistended, + BS MS: no deformity or atrophy  Skin: warm and dry Neuro:  Strength and sensation are intact Psych: euthymic mood, full affect  EKG:  EKG is not ordered today. Personal review of the ekg ordered 08/31/16 shows sinus rhythm  Recent Labs: 05/09/2016: Hemoglobin 12.3; Platelets 277 08/31/2016: ALT 17; BUN 16; Creatinine, Ser 0.95; Potassium 4.4; Sodium 139    Lipid Panel     Component Value Date/Time   CHOL 147 08/31/2016 0912   TRIG 157 (H) 08/31/2016 0912   HDL 78 08/31/2016 0912   CHOLHDL 1.9 08/31/2016 0912   CHOLHDL 2 09/15/2013 0922   VLDL 38.8 09/15/2013 0922   LDLCALC 38 08/31/2016 0912     Wt Readings from Last 3 Encounters:  09/25/16 163 lb 9.6 oz (74.2 kg)  08/31/16 162 lb (73.5 kg)  05/09/16 160 lb (72.6 kg)      Other studies Reviewed: Additional studies/ records that were reviewed today include: Myoview 06/13/16  Review of the above records today demonstrates:   Nuclear stress EF: 63%.  There was no ST segment deviation noted during stress.  No T wave inversion was noted during stress.  The study is normal.  This is a low risk study.   Low risk stress nuclear study with normal perfusion and normal left ventricular regional and global systolic function.   ASSESSMENT AND PLAN:  1.  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 She has had increased episodes over the last few months though. Due to that, we'll plan to start her on diltiazem 100 mg. We did discuss the option of ablation, she Augie Vane try medical management at this time.  2. Hypertension: Currently well controlled  3. Hyperlipidemia: on Crestor per primary cardiology   Current medicines are reviewed at length with the patient today.   The patient does not have concerns regarding her medicines.  The following changes  were made today:  Start diltiazem  Labs/ tests ordered today include:  No orders of the defined types were placed in this encounter.    Disposition:   FU with Aydan Phoenix 6 months  Signed, Trisa Cranor Meredith [name], MD  09/25/2016 3:17 PM     [address] [place] [address] [phone] (office) [phone] (fax)

## 2016-10-13 ENCOUNTER — Encounter: Payer: Self-pay | Admitting: Cardiology

## 2016-10-24 DIAGNOSIS — I771 Stricture of artery: Secondary | ICD-10-CM | POA: Diagnosis not present

## 2016-10-24 DIAGNOSIS — Z5181 Encounter for therapeutic drug level monitoring: Secondary | ICD-10-CM | POA: Diagnosis not present

## 2016-10-24 DIAGNOSIS — Z0000 Encounter for general adult medical examination without abnormal findings: Secondary | ICD-10-CM | POA: Diagnosis not present

## 2016-10-24 DIAGNOSIS — E782 Mixed hyperlipidemia: Secondary | ICD-10-CM | POA: Diagnosis not present

## 2016-10-24 DIAGNOSIS — I471 Supraventricular tachycardia: Secondary | ICD-10-CM | POA: Diagnosis not present

## 2016-10-24 DIAGNOSIS — R739 Hyperglycemia, unspecified: Secondary | ICD-10-CM | POA: Diagnosis not present

## 2016-10-24 DIAGNOSIS — G8190 Hemiplegia, unspecified affecting unspecified side: Secondary | ICD-10-CM | POA: Diagnosis not present

## 2016-10-24 DIAGNOSIS — E538 Deficiency of other specified B group vitamins: Secondary | ICD-10-CM | POA: Diagnosis not present

## 2016-10-24 DIAGNOSIS — M4800 Spinal stenosis, site unspecified: Secondary | ICD-10-CM | POA: Diagnosis not present

## 2016-10-26 ENCOUNTER — Ambulatory Visit (HOSPITAL_COMMUNITY)
Admission: RE | Admit: 2016-10-26 | Discharge: 2016-10-26 | Disposition: A | Discharge status: Home / Self Care | Payer: PPO | Source: Ambulatory Visit | Attending: Cardiology | Admitting: Cardiology

## 2016-10-26 ENCOUNTER — Encounter (HOSPITAL_COMMUNITY): Payer: Self-pay

## 2016-10-26 DIAGNOSIS — R0602 Shortness of breath: Secondary | ICD-10-CM

## 2016-10-26 DIAGNOSIS — I2510 Atherosclerotic heart disease of native coronary artery without angina pectoris: Secondary | ICD-10-CM | POA: Insufficient documentation

## 2016-10-26 DIAGNOSIS — I700 Atherosclerosis of aorta: Secondary | ICD-10-CM | POA: Insufficient documentation

## 2016-10-26 DIAGNOSIS — R079 Chest pain, unspecified: Secondary | ICD-10-CM | POA: Diagnosis not present

## 2016-10-26 LAB — POCT I-STAT CREATININE: Creatinine, Ser: 0.9 mg/dL (ref 0.44–1.00)

## 2016-10-26 MED ORDER — METOPROLOL TARTRATE 5 MG/5ML IV SOLN
INTRAVENOUS | Status: AC
  Administered 2016-10-26: 5 mg
  Filled 2016-10-26: qty 5

## 2016-10-26 MED ORDER — IOPAMIDOL (ISOVUE-370) INJECTION 76%
INTRAVENOUS | Status: AC
  Administered 2016-10-26: 80 mL
  Filled 2016-10-26: qty 100

## 2016-10-26 MED ORDER — NITROGLYCERIN 0.4 MG SL SUBL
SUBLINGUAL_TABLET | SUBLINGUAL | Status: AC
  Filled 2016-10-26: qty 2

## 2016-10-30 ENCOUNTER — Encounter: Payer: Self-pay | Admitting: Cardiology

## 2016-11-02 ENCOUNTER — Telehealth: Payer: Self-pay | Admitting: Cardiology

## 2016-11-02 DIAGNOSIS — E559 Vitamin D deficiency, unspecified: Secondary | ICD-10-CM | POA: Diagnosis not present

## 2016-11-02 DIAGNOSIS — M84374A Stress fracture, right foot, initial encounter for fracture: Secondary | ICD-10-CM | POA: Diagnosis not present

## 2016-11-02 NOTE — Telephone Encounter (Signed)
Follow Up:      Pt says she is returning Dr Theodosia Blender call from yesterday.

## 2016-11-02 NOTE — Telephone Encounter (Signed)
-----   Message from Sueanne Margarita, MD sent at 11/01/2016  2:28 PM EDT ----- Please let patient know that she has significant coronary calcium with likely obstructive disease of a diagonal and borderline disease of the LAD.  Please have her come in to be set up for left heart cath with possible PCI

## 2016-11-02 NOTE — Telephone Encounter (Signed)
Informed patient of results and verbal understanding expressed.  Scheduled patient Tuesday with D. Dunn, PA to discuss L heart cath. Patient understands to relax until appointment and to seek medical attention if symptoms worsen prior to appointment. She was grateful for call and agrees with treatment plan.

## 2016-11-02 NOTE — Telephone Encounter (Signed)
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 Continue ASA/Plavix/statin.  LDL at goal.  Await FFR results. Noncardiac findings are aortic ahterosclerosis

## 2016-11-06 ENCOUNTER — Encounter: Payer: Self-pay | Admitting: Physician Assistant

## 2016-11-06 NOTE — Progress Notes (Signed)
Cardiology Office Note    Date:  11/07/2016  ID:  Susan Gillespie, DOB 1946-03-05, MRN [id number] PCP:  [name], MD  Cardiologist: Dr. [name] EP: Dr. Curt Bears   Chief Complaint: discuss cath  History of Present Illness:  Susan Gillespie is a 71 y.o. 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  She also has a history of left subclavian steal. She underwent stenting by Dr. Estanislado Pandy and then repeat angioplasty on 01/05/2016 down to 80-85% patency. Repeat dopplers in 05/2016 showed 1-39% right and 40-59% left carotid stenosis with some evidence of bidirectional flow in the left subclavian artery. She has a family history of CAD and has had CP in the past with normal nuclear stress test. At [facility] 08/2016 she reported having more fatigue in her left arm along with achiness similar to prior symptoms. She was asked to contact Dr. Estanislado Pandy to discuss. She also reported exertional DOE and cardiac CT was arranged 10/26/16 whoch showed calcium score 92nd percentile for age/sex, significant calcific 3V disease with possibly hemodynamically significant stenosis - in mid RCA and mLAD - subsequent FFR overread showed:  1. LM:  Normal FFR.  2. LAD:  0.85.  3. D1: 0.80. -> this was felt to be functionally significant, cardiac cath recommended 4. RCA:  Proximal:  0.96, mid:  0.82. 5. LCX: 0.87.  She has also been referred to Dr. Curt Bears for her SVT. 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 Last labs showed LDL 38 and LFTs normal, Cr 0.95, glucose 109, K 4.4 (08/2016). Last CBC in 04/2016 wnl.  She presents back to clinic to review the above. Reports  significant exertional dyspnea for the last 4-5 months, progressive. Now notices even making the bed wears her out. She reports at the highest level of exertion she will also feel a sense of tightness across her left upper shoulder. + Fam hx with multiple family members with CAD/PCI/CABG.  Past Medical History:  Diagnosis Date  . Anxiety   . Arthritis   . Bleeding tendency ([place]) 05/16/2012   Hx post op bleeding (epidural hematoma s/p ACDF '10; LLQ hematoma due to superficial vessel fascial bleed post colon resection; Normal platelet count; saw Dr. [name] '14  . Carotid stenosis, bilateral 08/31/2016   1-39% right and 40-59% left ICA stenosis by dopplers 05/2016  . Colon polyp   . Diverticulitis   . Family history of cardiovascular disease   . Fracture of right fibula   . GERD (gastroesophageal reflux disease)   . Headache(784.0)    mirgraines - history of  . HOH (hard of hearing)    wears hearing aids  . Hyperlipidemia   . Hypertension   . IBS (irritable bowel syndrome)    followed by Dr. [name]  . Liver cyst   . Melanoma ([place])    removed from back 04/2013  . Shortness of breath dyspnea    with exertion  . Stroke ([place]) 06/2012   mild  . SVT (supraventricular tachycardia) ([place])   . Urinary urgency   . UTI (lower urinary tract infection)    x 2 since June 2016    Past Surgical History:  Procedure Laterality Date  .  ABDOMINAL HYSTERECTOMY    . ANGIOPLASTY     stent in subclavian LEFT 2016, angioplasty of stent 2017  . APPENDECTOMY    . BREAST EXCISIONAL BIOPSY Left    benign  . BREAST SURGERY     BIL breast augmentation  . CHOLECYSTECTOMY    . COLON SURGERY     Sigmoid Colectomy, returned for post op bleeding  . COLONOSCOPY W/ POLYPECTOMY    . COLONOSCOPY WITH PROPOFOL N/A 05/05/2016   Procedure: COLONOSCOPY WITH PROPOFOL;  Surgeon: Alphonsa Overall, MD;  [facility];  Service: General;  Laterality: N/A;  . ESOPHAGOGASTRODUODENOSCOPY (EGD) WITH PROPOFOL N/A  05/05/2016   Procedure: ESOPHAGOGASTRODUODENOSCOPY (EGD) WITH PROPOFOL;  Surgeon: Alphonsa Overall, MD;  [facility];  Service: General;  Laterality: N/A;  . EYE SURGERY     eyelid drooping fixed  . IR GENERIC HISTORICAL  12/24/2015   IR ANGIO INTRA EXTRACRAN SEL COM CAROTID INNOMINATE BILAT MOD SED 12/24/2015 Luanne Bras, MD [facility]  . IR GENERIC HISTORICAL  12/24/2015   IR ANGIOGRAM EXTREMITY LEFT 12/24/2015 Luanne Bras, MD [facility]  . IR GENERIC HISTORICAL  12/24/2015   IR ANGIO VERTEBRAL SEL VERTEBRAL UNI R MOD SED 12/24/2015 Luanne Bras, MD [facility]  . IR GENERIC HISTORICAL  01/05/2016   IR PTA NON CORO-LOWER EXTREM 01/05/2016 Luanne Bras, MD [facility]  . IR GENERIC HISTORICAL  03/07/2016   IR RADIOLOGIST EVAL & MGMT 03/07/2016 [facility]  . JOINT REPLACEMENT Left    thumb  . MASTOID DEBRIDEMENT    . MASTOIDECTOMY REVISION    . RADIOLOGY WITH ANESTHESIA N/A 11/09/2014   Procedure: STENT PLACEMENT;  Surgeon: Luanne Bras, MD;  Location: [facility];  Service: Radiology;  Laterality: N/A;  . RADIOLOGY WITH ANESTHESIA N/A 05/31/2015   Procedure: RADIOLOGY WITH ANESTHESIA;  Surgeon: Luanne Bras, MD;  Location: [facility];  Service: Radiology;  Laterality: N/A;  . RADIOLOGY WITH ANESTHESIA N/A 01/05/2016   Procedure: RADIOLOGY WITH ANESTHESIA ANGIOPLASTY WITH STENTNG;  Surgeon: Luanne Bras, MD;  Location: [facility];  Service: Radiology;  Laterality: N/A;  . skin cancer excised  04/2013   Melonoma  . SPINE SURGERY     cervical fusion, returned to OR for post op  bleeding  . TONSILLECTOMY    . TONSILLECTOMY    . TUBAL LIGATION    . VAGINA SURGERY     anterior posterior repair    Current Medications: Current Meds  Medication Sig  . acetaminophen (TYLENOL) 650 MG CR tablet Take 650-1,300 mg by mouth every 8 (eight) hours as needed for pain.   Marland Kitchen ALPRAZolam (XANAX) 0.5 MG tablet Take 0.25 mg by mouth at bedtime as needed for anxiety or sleep.   Marland Kitchen  aspirin 325 MG tablet Take 1 tablet (325 mg total) by mouth daily.  . celecoxib (CELEBREX) 200 MG capsule Take 200 mg by mouth daily as needed for mild pain.  . cetirizine (ZYRTEC) 10 MG tablet Take 10 mg by mouth every morning.   . clopidogrel (PLAVIX) 75 MG tablet Take 1 tablet (75 mg total) by mouth daily.  Marland Kitchen diltiazem (CARDIZEM CD) 180 MG 24 hr capsule Take 1 capsule (180 mg total) by mouth daily.  Marland Kitchen estradiol (VIVELLE-DOT) 0.05 MG/24HR patch Place 1 patch onto the skin every Wednesday.   . famotidine (PEPCID) 40 MG tablet Take 40 mg by mouth at bedtime.   Marland Kitchen loperamide (IMODIUM A-D) 2 MG tablet Take 2 mg by mouth daily.  Marland Kitchen losartan (COZAAR) 50 MG tablet  Take 50 mg by mouth 2 (two) times daily.   . metoprolol tartrate (LOPRESSOR) 50 MG tablet Take 1 tablet (50 mg total) by mouth daily as needed.  . potassium chloride (K-DUR) 10 MEQ tablet Take 10 mEq by mouth daily.  . rosuvastatin (CRESTOR) 20 MG tablet Take 20 mg by mouth daily.  . SUMAtriptan (IMITREX) 50 MG tablet Take 50 mg by mouth every 2 (two) hours as needed for migraine. May repeat in 2 hours if headache persists or recurs.  . traZODone (DESYREL) 100 MG tablet Take 50 mg by mouth at bedtime.   . [DISCONTINUED] potassium chloride (K-DUR,KLOR-CON) 10 MEQ tablet Take 10 mEq by mouth 2 (two) times daily.     Allergies:   Daypro [oxaprozin]; Lisinopril; Other; and Simvastatin   Social History   Social History  . Marital status: Divorced    Spouse name: N/A  . Number of children: 2  . Years of education: 14   Occupational History  . 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 Smoking status: Former Smoker    Packs/day: 2.00    Years: 22.00    Types: Cigarettes    Quit date: 04/23/1985  . Smokeless tobacco: Never Used  . Alcohol use 2.4 oz/week    4 Glasses of wine per week     Comment: 1 drink a  day  . Drug use: No  . Sexual activity: Yes    Birth control/  protection: Surgical   Other Topics Concern  . None   Social History Narrative   ** Merged History Encounter **       Domestic partner   Regular exercise-no   Right handed   Caffeine use-- 2 cups coffee daily, rare soda     Family History:  Family History  Problem Relation Age of Onset  . Hyperlipidemia Other   . Hypertension Other   . Cancer Other        lung, esophagus, stomach  . Stroke Other   . Heart disease Father   . Heart disease Sister   . Heart attack Sister   . Heart disease Brother   . Heart attack Brother   . Stroke Son     ROS:   Please see the history of present illness. All other systems are reviewed and otherwise negative.    PHYSICAL EXAM:   VS:  BP 134/66   Pulse 63   Ht 5\' 3"  (1.6 m)   Wt 160 lb (72.6 kg)   SpO2 98%   BMI 28.34 kg/m   BMI: Body mass index is 28.34 kg/m. GEN: Well nourished, well developed WF, in no acute distress  HEENT: normocephalic, atraumatic Neck: no JVD, carotid bruits, or masses. No subclavian bruits Cardiac: RRR; no murmurs, rubs, or gallops, no edema  Respiratory:  clear to auscultation bilaterally, normal work of breathing GI: soft, nontender, nondistended, + BS MS: no deformity or atrophy  Skin: warm and dry, no rash Neuro:  Alert and Oriented x 3, Strength and sensation are intact, follows commands Psych: euthymic mood, full affect  Wt Readings from Last 3 Encounters:  11/07/16 160 lb (72.6 kg)  09/25/16 163 lb 9.6 oz (74.2 kg)  08/31/16 162 lb (73.5 kg)      Studies/Labs Reviewed:   EKG:  EKG was ordered today and personally reviewed by me and demonstrates NSR 63bpm no acute ST-T changes  Recent Labs: 05/09/2016: Hemoglobin 12.3; Platelets  277 08/31/2016: ALT 17; BUN 16; Potassium 4.4; Sodium 139 10/26/2016: Creatinine, Ser 0.90   Lipid Panel    Component Value Date/Time   CHOL 147 08/31/2016 0912   TRIG 157 (H) 08/31/2016 0912   HDL 78 08/31/2016 0912   CHOLHDL 1.9 08/31/2016 0912   CHOLHDL 2  09/15/2013 0922   VLDL 38.8 09/15/2013 0922   LDLCALC 38 08/31/2016 0912    Additional studies/ records that were reviewed today include: Summarized above.    ASSESSMENT & PLAN:   1. DOE/CAD noted on CT - symptoms concerning for underlying CAD contributing to DOE as anginal equivalent. Dr. [name] recommends cath and I agree. Risks and benefits of cardiac catheterization have been discussed with the patient.  These include bleeding, infection, kidney damage, stroke, heart attack, death. The patient understands these risks and is willing to proceed. Note prior history of subclavian stenosis with regard to vascular access. Check pre-cath labs. Will continue current regimen. Of note she has been on full dose ASA/Plavix but this has come at the direction of Dr. Estanislado Pandy managing #4. 2. Paroxysmal SVT - quiescent, continue current regimen. 3. Essential HTN - BP minimally above goal but recent BP last month was 120/62. Follow for now. Depending on cath report may end up with titration of medical therapy so will hold off on any changes for now. 4. H/o carotid artery disease and subclavian stenosis - followed by Dr. Bronson Curb, pt recently asked to contact his office. She asked I send a copy of this note to him to keep him informed of events. Will route upon signing.  Disposition: F/u with Dr. Betsy Pries team APP 2 weeks post-cath.   Medication Adjustments/Labs and Tests Ordered: Current medicines are reviewed at length with the patient today.  Concerns regarding medicines are outlined above. Medication changes, Labs and Tests ordered today are summarized above and listed in the Patient Instructions accessible in Encounters.   Signed, Charlie Pitter, PA-C  11/07/2016 12:24 PM    [facility] [address], [place], [place]  [address] Phone: [phone]; Fax: [phone]

## 2016-11-07 ENCOUNTER — Ambulatory Visit (INDEPENDENT_AMBULATORY_CARE_PROVIDER_SITE_OTHER): Payer: PPO | Admitting: Physician Assistant

## 2016-11-07 ENCOUNTER — Encounter: Payer: Self-pay | Admitting: Physician Assistant

## 2016-11-07 VITALS — BP 134/66 | HR 63 | Ht 63.0 in | Wt 160.0 lb

## 2016-11-07 DIAGNOSIS — R0609 Other forms of dyspnea: Secondary | ICD-10-CM

## 2016-11-07 DIAGNOSIS — I471 Supraventricular tachycardia: Secondary | ICD-10-CM

## 2016-11-07 DIAGNOSIS — I2510 Atherosclerotic heart disease of native coronary artery without angina pectoris: Secondary | ICD-10-CM

## 2016-11-07 DIAGNOSIS — I771 Stricture of artery: Secondary | ICD-10-CM | POA: Diagnosis not present

## 2016-11-07 DIAGNOSIS — R931 Abnormal findings on diagnostic imaging of heart and coronary circulation: Secondary | ICD-10-CM | POA: Diagnosis not present

## 2016-11-07 DIAGNOSIS — I10 Essential (primary) hypertension: Secondary | ICD-10-CM

## 2016-11-07 DIAGNOSIS — R0600 Dyspnea, unspecified: Secondary | ICD-10-CM

## 2016-11-07 NOTE — Patient Instructions (Signed)
Medication Instructions:  Your physician recommends that you continue on your current medications as directed. Please refer to the Current Medication list given to you today.   Labwork: TODAY:  BMET, CBC, & PT/INR  Testing/Procedures: Your physician has requested that you have a cardiac catheterization. Cardiac catheterization is used to diagnose and/or treat various heart conditions. Doctors may recommend this procedure for a number of different reasons. The most common reason is to evaluate chest pain. Chest pain can be a symptom of coronary artery disease (CAD), and cardiac catheterization can show whether plaque is narrowing or blocking your heart's arteries. This procedure is also used to evaluate the valves, as well as measure the blood flow and oxygen levels in different parts of your heart. For further information please visit [email or web address]. Please follow instruction sheet, as given.    Follow-Up: Your physician recommends that you schedule a follow-up appointment in: 2 WEEKS FROM 11/10/16 WITH DR. [name] OR DAYNA DUNN, PA-C  Any Other Special Instructions Will Be Listed Below (If Applicable).    [place] OFFICE [address] [place] [address] [place] [address] Dept: [phone] Loc: [phone]  Kyona Chauncey Capozzi  11/07/2016  You are scheduled for a Cardiac Catheterization on Friday, July 27 with Dr. Daneen Schick.  1. Please arrive at the [facility] (Main Entrance A) at [facility]: [address] [place], [place] [address] at 8:00 AM (two hours before your procedure to ensure your preparation). Free valet parking service is available.   Special note: Every effort is made to have your procedure done on time. Please understand that emergencies sometimes delay scheduled procedures.  2. Diet: Do not eat or drink anything after midnight prior to your procedure except sips of water to take  medications.  3. Labs: TODAY  4. Medication instructions in preparation for your procedure: NONE.  Take as usual with sips of water  5. Plan for one night stay--bring personal belongings. 6. Bring a current list of your medications and current insurance cards. 7. You MUST have a responsible person to drive you home. 8. Someone MUST be with you the first 24 hours after you arrive home or your discharge will be delayed. 9. Please wear clothes that are easy to get on and off and wear slip-on shoes.  Thank you for allowing [place] to care for you!   -- [facility]    If you need a refill on your cardiac medications before your next appointment, please call your pharmacy.

## 2016-11-08 LAB — BASIC METABOLIC PANEL
BUN/Creatinine Ratio: 16 (ref 12–28)
BUN: 14 mg/dL (ref 8–27)
CALCIUM: 10.5 mg/dL — AB (ref 8.7–10.3)
CHLORIDE: 100 mmol/L (ref 96–106)
CO2: 23 mmol/L (ref 20–29)
Creatinine, Ser: 0.89 mg/dL (ref 0.57–1.00)
GFR calc Af Amer: 75 mL/min/{1.73_m2} (ref >59)
GFR calc non Af Amer: 65 mL/min/{1.73_m2} (ref >59)
Glucose: 104 mg/dL — ABNORMAL HIGH (ref 65–99)
Potassium: 5 mmol/L (ref 3.5–5.2)
Sodium: 141 mmol/L (ref 134–144)

## 2016-11-08 LAB — CBC
HEMATOCRIT: 35.5 % (ref 34.0–46.6)
HEMOGLOBIN: 12.2 g/dL (ref 11.1–15.9)
MCH: 32.7 pg (ref 26.6–33.0)
MCHC: 34.4 g/dL (ref 31.5–35.7)
MCV: 95 fL (ref 79–97)
Platelets: 265 10*3/uL (ref 150–379)
RBC: 3.73 x10E6/uL — ABNORMAL LOW (ref 3.77–5.28)
RDW: 13.3 % (ref 12.3–15.4)
WBC: 5.2 10*3/uL (ref 3.4–10.8)

## 2016-11-08 LAB — PROTIME-INR
INR: 0.9 (ref 0.8–1.2)
PROTHROMBIN TIME: 9.9 s (ref 9.1–12.0)

## 2016-11-09 ENCOUNTER — Telehealth: Payer: Self-pay

## 2016-11-09 NOTE — Telephone Encounter (Signed)
Left detailed VM per DPR.  Patient contacted pre-catheterization at [facility] scheduled for:  11/10/2016 @ 1030 Verified arrival time and place:  NT @ 0800 AM meds to be taken pre-cath with sip of water:  Notified to take ASA and Plavix prior to arrival Must have responsible person to drive home post procedure and observe patient for 24 hours Addl concerns:  Left this nurse name and # for call back if any further questions.

## 2016-11-10 ENCOUNTER — Other Ambulatory Visit: Payer: Self-pay | Admitting: Physician Assistant

## 2016-11-10 ENCOUNTER — Ambulatory Visit (HOSPITAL_COMMUNITY)
Admission: RE | Admit: 2016-11-10 | Discharge: 2016-11-10 | Disposition: A | Discharge status: Home / Self Care | Payer: PPO | Source: Ambulatory Visit | Attending: Interventional Cardiology | Admitting: Interventional Cardiology

## 2016-11-10 ENCOUNTER — Encounter (HOSPITAL_COMMUNITY)
Admission: RE | Disposition: A | Discharge status: Home / Self Care | Payer: Self-pay | Source: Ambulatory Visit | Attending: Interventional Cardiology

## 2016-11-10 ENCOUNTER — Encounter (HOSPITAL_COMMUNITY): Payer: Self-pay | Admitting: Interventional Cardiology

## 2016-11-10 DIAGNOSIS — Z7902 Long term (current) use of antithrombotics/antiplatelets: Secondary | ICD-10-CM | POA: Diagnosis not present

## 2016-11-10 DIAGNOSIS — K589 Irritable bowel syndrome without diarrhea: Secondary | ICD-10-CM | POA: Diagnosis not present

## 2016-11-10 DIAGNOSIS — I471 Supraventricular tachycardia, unspecified: Secondary | ICD-10-CM | POA: Diagnosis present

## 2016-11-10 DIAGNOSIS — E785 Hyperlipidemia, unspecified: Secondary | ICD-10-CM | POA: Diagnosis not present

## 2016-11-10 DIAGNOSIS — M1990 Unspecified osteoarthritis, unspecified site: Secondary | ICD-10-CM | POA: Diagnosis not present

## 2016-11-10 DIAGNOSIS — I2584 Coronary atherosclerosis due to calcified coronary lesion: Secondary | ICD-10-CM | POA: Diagnosis not present

## 2016-11-10 DIAGNOSIS — R7303 Prediabetes: Secondary | ICD-10-CM | POA: Diagnosis present

## 2016-11-10 DIAGNOSIS — F419 Anxiety disorder, unspecified: Secondary | ICD-10-CM | POA: Diagnosis not present

## 2016-11-10 DIAGNOSIS — Z8673 Personal history of transient ischemic attack (TIA), and cerebral infarction without residual deficits: Secondary | ICD-10-CM | POA: Diagnosis not present

## 2016-11-10 DIAGNOSIS — I10 Essential (primary) hypertension: Secondary | ICD-10-CM | POA: Diagnosis present

## 2016-11-10 DIAGNOSIS — I6523 Occlusion and stenosis of bilateral carotid arteries: Secondary | ICD-10-CM | POA: Insufficient documentation

## 2016-11-10 DIAGNOSIS — Z7982 Long term (current) use of aspirin: Secondary | ICD-10-CM | POA: Insufficient documentation

## 2016-11-10 DIAGNOSIS — R0600 Dyspnea, unspecified: Secondary | ICD-10-CM

## 2016-11-10 DIAGNOSIS — Z8249 Family history of ischemic heart disease and other diseases of the circulatory system: Secondary | ICD-10-CM | POA: Diagnosis not present

## 2016-11-10 DIAGNOSIS — R0609 Other forms of dyspnea: Secondary | ICD-10-CM

## 2016-11-10 DIAGNOSIS — K219 Gastro-esophageal reflux disease without esophagitis: Secondary | ICD-10-CM | POA: Insufficient documentation

## 2016-11-10 DIAGNOSIS — I2510 Atherosclerotic heart disease of native coronary artery without angina pectoris: Secondary | ICD-10-CM

## 2016-11-10 DIAGNOSIS — R079 Chest pain, unspecified: Secondary | ICD-10-CM | POA: Insufficient documentation

## 2016-11-10 DIAGNOSIS — R0602 Shortness of breath: Secondary | ICD-10-CM | POA: Diagnosis present

## 2016-11-10 DIAGNOSIS — Z87891 Personal history of nicotine dependence: Secondary | ICD-10-CM | POA: Diagnosis not present

## 2016-11-10 HISTORY — PX: LEFT HEART CATH AND CORONARY ANGIOGRAPHY: CATH118249

## 2016-11-10 SURGERY — LEFT HEART CATH AND CORONARY ANGIOGRAPHY
Anesthesia: LOCAL

## 2016-11-10 MED ORDER — SODIUM CHLORIDE 0.9 % WEIGHT BASED INFUSION: 3.0000 mL/kg/h | INTRAVENOUS | Status: AC

## 2016-11-10 MED ORDER — SODIUM CHLORIDE 0.9% FLUSH: 3.0000 mL | INTRAVENOUS | Status: DC | PRN

## 2016-11-10 MED ORDER — IOPAMIDOL (ISOVUE-370) INJECTION 76%
INTRAVENOUS | Status: DC | PRN
  Administered 2016-11-10: 110 mL via INTRA_ARTERIAL

## 2016-11-10 MED ORDER — SODIUM CHLORIDE 0.9% FLUSH: 3.0000 mL | Freq: Two times a day (BID) | INTRAVENOUS | Status: DC

## 2016-11-10 MED ORDER — NITROGLYCERIN 1 MG/10 ML FOR IR/CATH LAB
INTRA_ARTERIAL | Status: DC | PRN
  Administered 2016-11-10: 200 ug via INTRACORONARY

## 2016-11-10 MED ORDER — SODIUM CHLORIDE 0.9 % IV SOLN: INTRAVENOUS | Status: DC

## 2016-11-10 MED ORDER — FENTANYL CITRATE (PF) 100 MCG/2ML IJ SOLN
INTRAMUSCULAR | Status: DC | PRN
  Administered 2016-11-10: 50 ug via INTRAVENOUS

## 2016-11-10 MED ORDER — MIDAZOLAM HCL 2 MG/2ML IJ SOLN
INTRAMUSCULAR | Status: AC
  Filled 2016-11-10: qty 2

## 2016-11-10 MED ORDER — ONDANSETRON HCL 4 MG/2ML IJ SOLN: 4.0000 mg | Freq: Four times a day (QID) | INTRAMUSCULAR | Status: DC | PRN

## 2016-11-10 MED ORDER — IOPAMIDOL (ISOVUE-370) INJECTION 76%
INTRAVENOUS | Status: AC
  Filled 2016-11-10: qty 100

## 2016-11-10 MED ORDER — HEPARIN (PORCINE) IN NACL 2-0.9 UNIT/ML-% IJ SOLN
INTRAMUSCULAR | Status: AC
  Filled 2016-11-10: qty 1000

## 2016-11-10 MED ORDER — ASPIRIN 81 MG PO CHEW
81.0000 mg | CHEWABLE_TABLET | ORAL | Status: DC
Start: 2016-11-10 — End: 2016-11-10

## 2016-11-10 MED ORDER — OXYCODONE-ACETAMINOPHEN 5-325 MG PO TABS: 1.0000 | ORAL_TABLET | ORAL | Status: DC | PRN

## 2016-11-10 MED ORDER — SODIUM CHLORIDE 0.9 % IV SOLN: 250.0000 mL | INTRAVENOUS | Status: DC | PRN

## 2016-11-10 MED ORDER — HEPARIN (PORCINE) IN NACL 2-0.9 UNIT/ML-% IJ SOLN
INTRAMUSCULAR | Status: AC | PRN
  Administered 2016-11-10: 1000 mL via INTRA_ARTERIAL

## 2016-11-10 MED ORDER — VERAPAMIL HCL 2.5 MG/ML IV SOLN
INTRAVENOUS | Status: DC | PRN
  Administered 2016-11-10: 10:00:00 via INTRA_ARTERIAL

## 2016-11-10 MED ORDER — SODIUM CHLORIDE 0.9 % IV SOLN
INTRAVENOUS | Status: AC | PRN
  Administered 2016-11-10: 500 mL via INTRAVENOUS

## 2016-11-10 MED ORDER — NITROGLYCERIN 0.4 MG SL SUBL: 0.4000 mg | SUBLINGUAL_TABLET | SUBLINGUAL | 3 refills | Status: DC | PRN

## 2016-11-10 MED ORDER — HEPARIN SODIUM (PORCINE) 1000 UNIT/ML IJ SOLN
INTRAMUSCULAR | Status: DC | PRN
  Administered 2016-11-10: 4000 [IU] via INTRAVENOUS

## 2016-11-10 MED ORDER — ACETAMINOPHEN 325 MG PO TABS: 650.0000 mg | ORAL_TABLET | ORAL | Status: DC | PRN

## 2016-11-10 MED ORDER — HEPARIN SODIUM (PORCINE) 1000 UNIT/ML IJ SOLN
INTRAMUSCULAR | Status: AC
  Filled 2016-11-10: qty 1

## 2016-11-10 MED ORDER — SODIUM CHLORIDE 0.9 % WEIGHT BASED INFUSION: 1.0000 mL/kg/h | INTRAVENOUS | Status: DC

## 2016-11-10 MED ORDER — LIDOCAINE HCL (PF) 1 % IJ SOLN
INTRAMUSCULAR | Status: DC | PRN
  Administered 2016-11-10: 2 mL

## 2016-11-10 MED ORDER — NITROGLYCERIN 1 MG/10 ML FOR IR/CATH LAB
INTRA_ARTERIAL | Status: AC
  Filled 2016-11-10: qty 10

## 2016-11-10 MED ORDER — FENTANYL CITRATE (PF) 100 MCG/2ML IJ SOLN
INTRAMUSCULAR | Status: AC
Start: 2016-11-10 — End: ?
  Filled 2016-11-10: qty 2

## 2016-11-10 MED ORDER — IOPAMIDOL (ISOVUE-370) INJECTION 76%
INTRAVENOUS | Status: AC
  Filled 2016-11-10: qty 50

## 2016-11-10 MED ORDER — MIDAZOLAM HCL 2 MG/2ML IJ SOLN
INTRAMUSCULAR | Status: DC | PRN
  Administered 2016-11-10 (×3): 1 mg via INTRAVENOUS

## 2016-11-10 MED ORDER — LIDOCAINE HCL (PF) 1 % IJ SOLN
INTRAMUSCULAR | Status: AC
  Filled 2016-11-10: qty 30

## 2016-11-10 MED ORDER — VERAPAMIL HCL 2.5 MG/ML IV SOLN
INTRAVENOUS | Status: AC
  Filled 2016-11-10: qty 2

## 2016-11-10 SURGICAL SUPPLY — 13 items
CATH EXPO 5FR FR4 (CATHETERS) ×1 IMPLANT
CATH INFINITI 5 FR JL3.5 (CATHETERS) ×1 IMPLANT
COVER PRB 48X5XTLSCP FOLD TPE (BAG) IMPLANT
COVER PROBE 5X48 (BAG) ×2
DEVICE RAD COMP TR BAND LRG (VASCULAR PRODUCTS) ×1 IMPLANT
GLIDESHEATH SLEND A-KIT 6F 22G (SHEATH) ×1 IMPLANT
GUIDEWIRE INQWIRE 1.5J.035X260 (WIRE) IMPLANT
INQWIRE 1.5J .035X260CM (WIRE) ×2
KIT HEART LEFT (KITS) ×2 IMPLANT
PACK CARDIAC CATHETERIZATION (CUSTOM PROCEDURE TRAY) ×2 IMPLANT
TRANSDUCER W/STOPCOCK (MISCELLANEOUS) ×2 IMPLANT
TUBING CIL FLEX 10 FLL-RA (TUBING) ×2 IMPLANT
WIRE HI TORQ VERSACORE-J 145CM (WIRE) ×1 IMPLANT

## 2016-11-10 NOTE — Interval H&P Note (Signed)
Cath Lab Visit (complete for each Cath Lab visit)  Clinical Evaluation Leading to the Procedure:   ACS: No.  Non-ACS:    Anginal Classification: CCS II  Anti-ischemic medical therapy: No Therapy  Non-Invasive Test Results: High-risk stress test findings: cardiac mortality >3%/year  Prior CABG: No previous CABG      History and Physical Interval Note:  11/10/2016 9:59 AM  Susan Gillespie  has presented today for surgery, with the diagnosis of abnormal cardiac CT, sob  The various methods of treatment have been discussed with the patient and family. After consideration of risks, benefits and other options for treatment, the patient has consented to  Procedure(s): Left Heart Cath and Coronary Angiography (N/A) as a surgical intervention .  The patient's history has been reviewed, patient examined, no change in status, stable for surgery.  I have reviewed the patient's chart and labs.  Questions were answered to the patient's satisfaction.     [name]

## 2016-11-10 NOTE — Discharge Instructions (Signed)

## 2016-11-10 NOTE — H&P (View-Only) (Signed)
Cardiology Office Note    Date:  11/07/2016  ID:  Susan Gillespie, DOB 1945/06/22, MRN [id number] PCP:  [name], MD  Cardiologist: Dr. [name] EP: Dr. Curt Bears   Chief Complaint: discuss cath  History of Present Illness:  Susan Gillespie is a 71 y.o. 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  She also has a history of left subclavian steal. She underwent stenting by Dr. Estanislado Pandy and then repeat angioplasty on 01/05/2016 down to 80-85% patency. Repeat dopplers in 05/2016 showed 1-39% right and 40-59% left carotid stenosis with some evidence of bidirectional flow in the left subclavian artery. She has a family history of CAD and has had CP in the past with normal nuclear stress test. At [place] 08/2016 she reported having more fatigue in her left arm along with achiness similar to prior symptoms. She was asked to contact Dr. Estanislado Pandy to discuss. She also reported exertional DOE and cardiac CT was arranged 10/26/16 whoch showed calcium score 92nd percentile for age/sex, significant calcific 3V disease with possibly hemodynamically significant stenosis - in mid RCA and mLAD - subsequent FFR overread showed:  1. LM:  Normal FFR.  2. LAD:  0.85.  3. D1: 0.80. -> this was felt to be functionally significant, cardiac cath recommended 4. RCA:  Proximal:  0.96, mid:  0.82. 5. LCX: 0.87.  She has also been referred to Dr. Curt Bears for her SVT. 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 Last labs showed LDL 38 and LFTs normal, Cr 0.95, glucose 109, K 4.4 (08/2016). Last CBC in 04/2016 wnl.  She presents back to clinic to review the above. Reports  significant exertional dyspnea for the last 4-5 months, progressive. Now notices even making the bed wears her out. She reports at the highest level of exertion she will also feel a sense of tightness across her left upper shoulder. + Fam hx with multiple family members with CAD/PCI/CABG.  Past Medical History:  Diagnosis Date  . Anxiety   . Arthritis   . Bleeding tendency ([place]) 05/16/2012   Hx post op bleeding (epidural hematoma s/p ACDF '10; LLQ hematoma due to superficial vessel fascial bleed post colon resection; Normal platelet count; saw Dr. [name] '14  . Carotid stenosis, bilateral 08/31/2016   1-39% right and 40-59% left ICA stenosis by dopplers 05/2016  . Colon polyp   . Diverticulitis   . Family history of cardiovascular disease   . Fracture of right fibula   . GERD (gastroesophageal reflux disease)   . Headache(784.0)    mirgraines - history of  . HOH (hard of hearing)    wears hearing aids  . Hyperlipidemia   . Hypertension   . IBS (irritable bowel syndrome)    followed by Dr. [name]  . Liver cyst   . Melanoma ([place])    removed from back 04/2013  . Shortness of breath dyspnea    with exertion  . Stroke ([place]) 06/2012   mild  . SVT (supraventricular tachycardia) ([place])   . Urinary urgency   . UTI (lower urinary tract infection)    x 2 since June 2016    Past Surgical History:  Procedure Laterality Date  .  ABDOMINAL HYSTERECTOMY    . ANGIOPLASTY     stent in subclavian LEFT 2016, angioplasty of stent 2017  . APPENDECTOMY    . BREAST EXCISIONAL BIOPSY Left    benign  . BREAST SURGERY     BIL breast augmentation  . CHOLECYSTECTOMY    . COLON SURGERY     Sigmoid Colectomy, returned for post op bleeding  . COLONOSCOPY W/ POLYPECTOMY    . COLONOSCOPY WITH PROPOFOL N/A 05/05/2016   Procedure: COLONOSCOPY WITH PROPOFOL;  Surgeon: Alphonsa Overall, MD;  Location: [facility];  Service: General;  Laterality: N/A;  . ESOPHAGOGASTRODUODENOSCOPY (EGD) WITH PROPOFOL N/A  05/05/2016   Procedure: ESOPHAGOGASTRODUODENOSCOPY (EGD) WITH PROPOFOL;  Surgeon: Alphonsa Overall, MD;  Location: [facility];  Service: General;  Laterality: N/A;  . EYE SURGERY     eyelid drooping fixed  . IR GENERIC HISTORICAL  12/24/2015   IR ANGIO INTRA EXTRACRAN SEL COM CAROTID INNOMINATE BILAT MOD SED 12/24/2015 Luanne Bras, MD [facility]  . IR GENERIC HISTORICAL  12/24/2015   IR ANGIOGRAM EXTREMITY LEFT 12/24/2015 Luanne Bras, MD [facility]  . IR GENERIC HISTORICAL  12/24/2015   IR ANGIO VERTEBRAL SEL VERTEBRAL UNI R MOD SED 12/24/2015 Luanne Bras, MD [facility]  . IR GENERIC HISTORICAL  01/05/2016   IR PTA NON CORO-LOWER EXTREM 01/05/2016 Luanne Bras, MD [facility]  . IR GENERIC HISTORICAL  03/07/2016   IR RADIOLOGIST EVAL & MGMT 03/07/2016 [facility]  . JOINT REPLACEMENT Left    thumb  . MASTOID DEBRIDEMENT    . MASTOIDECTOMY REVISION    . RADIOLOGY WITH ANESTHESIA N/A 11/09/2014   Procedure: STENT PLACEMENT;  Surgeon: Luanne Bras, MD;  Location: [facility];  Service: Radiology;  Laterality: N/A;  . RADIOLOGY WITH ANESTHESIA N/A 05/31/2015   Procedure: RADIOLOGY WITH ANESTHESIA;  Surgeon: Luanne Bras, MD;  Location: [facility];  Service: Radiology;  Laterality: N/A;  . RADIOLOGY WITH ANESTHESIA N/A 01/05/2016   Procedure: RADIOLOGY WITH ANESTHESIA ANGIOPLASTY WITH STENTNG;  Surgeon: Luanne Bras, MD;  Location: [facility];  Service: Radiology;  Laterality: N/A;  . skin cancer excised  04/2013   Melonoma  . SPINE SURGERY     cervical fusion, returned to OR for post op  bleeding  . TONSILLECTOMY    . TONSILLECTOMY    . TUBAL LIGATION    . VAGINA SURGERY     anterior posterior repair    Current Medications: Current Meds  Medication Sig  . acetaminophen (TYLENOL) 650 MG CR tablet Take 650-1,300 mg by mouth every 8 (eight) hours as needed for pain.   Marland Kitchen ALPRAZolam (XANAX) 0.5 MG tablet Take 0.25 mg by mouth at bedtime as needed for anxiety or sleep.   Marland Kitchen  aspirin 325 MG tablet Take 1 tablet (325 mg total) by mouth daily.  . celecoxib (CELEBREX) 200 MG capsule Take 200 mg by mouth daily as needed for mild pain.  . cetirizine (ZYRTEC) 10 MG tablet Take 10 mg by mouth every morning.   . clopidogrel (PLAVIX) 75 MG tablet Take 1 tablet (75 mg total) by mouth daily.  Marland Kitchen diltiazem (CARDIZEM CD) 180 MG 24 hr capsule Take 1 capsule (180 mg total) by mouth daily.  Marland Kitchen estradiol (VIVELLE-DOT) 0.05 MG/24HR patch Place 1 patch onto the skin every Wednesday.   . famotidine (PEPCID) 40 MG tablet Take 40 mg by mouth at bedtime.   Marland Kitchen loperamide (IMODIUM A-D) 2 MG tablet Take 2 mg by mouth daily.  Marland Kitchen losartan (COZAAR) 50 MG tablet  Take 50 mg by mouth 2 (two) times daily.   . metoprolol tartrate (LOPRESSOR) 50 MG tablet Take 1 tablet (50 mg total) by mouth daily as needed.  . potassium chloride (K-DUR) 10 MEQ tablet Take 10 mEq by mouth daily.  . rosuvastatin (CRESTOR) 20 MG tablet Take 20 mg by mouth daily.  . SUMAtriptan (IMITREX) 50 MG tablet Take 50 mg by mouth every 2 (two) hours as needed for migraine. May repeat in 2 hours if headache persists or recurs.  . traZODone (DESYREL) 100 MG tablet Take 50 mg by mouth at bedtime.   . [DISCONTINUED] potassium chloride (K-DUR,KLOR-CON) 10 MEQ tablet Take 10 mEq by mouth 2 (two) times daily.     Allergies:   Daypro [oxaprozin]; Lisinopril; Other; and Simvastatin   Social History   Social History  . Marital status: Divorced    Spouse name: N/A  . Number of children: 2  . Years of education: 14   Occupational History  . 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 Smoking status: Former Smoker    Packs/day: 2.00    Years: 22.00    Types: Cigarettes    Quit date: 04/23/1985  . Smokeless tobacco: Never Used  . Alcohol use 2.4 oz/week    4 Glasses of wine per week     Comment: 1 drink a  day  . Drug use: No  . Sexual activity: Yes    Birth control/  protection: Surgical   Other Topics Concern  . None   Social History Narrative   ** Merged History Encounter **       Domestic partner   Regular exercise-no   Right handed   Caffeine use-- 2 cups coffee daily, rare soda     Family History:  Family History  Problem Relation Age of Onset  . Hyperlipidemia Other   . Hypertension Other   . Cancer Other        lung, esophagus, stomach  . Stroke Other   . Heart disease Father   . Heart disease Sister   . Heart attack Sister   . Heart disease Brother   . Heart attack Brother   . Stroke Son     ROS:   Please see the history of present illness. All other systems are reviewed and otherwise negative.    PHYSICAL EXAM:   VS:  BP 134/66   Pulse 63   Ht 5\' 3"  (1.6 m)   Wt 160 lb (72.6 kg)   SpO2 98%   BMI 28.34 kg/m   BMI: Body mass index is 28.34 kg/m. GEN: Well nourished, well developed WF, in no acute distress  HEENT: normocephalic, atraumatic Neck: no JVD, carotid bruits, or masses. No subclavian bruits Cardiac: RRR; no murmurs, rubs, or gallops, no edema  Respiratory:  clear to auscultation bilaterally, normal work of breathing GI: soft, nontender, nondistended, + BS MS: no deformity or atrophy  Skin: warm and dry, no rash Neuro:  Alert and Oriented x 3, Strength and sensation are intact, follows commands Psych: euthymic mood, full affect  Wt Readings from Last 3 Encounters:  11/07/16 160 lb (72.6 kg)  09/25/16 163 lb 9.6 oz (74.2 kg)  08/31/16 162 lb (73.5 kg)      Studies/Labs Reviewed:   EKG:  EKG was ordered today and personally reviewed by me and demonstrates NSR 63bpm no acute ST-T changes  Recent Labs: 05/09/2016: Hemoglobin 12.3; Platelets  277 08/31/2016: ALT 17; BUN 16; Potassium 4.4; Sodium 139 10/26/2016: Creatinine, Ser 0.90   Lipid Panel    Component Value Date/Time   CHOL 147 08/31/2016 0912   TRIG 157 (H) 08/31/2016 0912   HDL 78 08/31/2016 0912   CHOLHDL 1.9 08/31/2016 0912   CHOLHDL 2  09/15/2013 0922   VLDL 38.8 09/15/2013 0922   LDLCALC 38 08/31/2016 0912    Additional studies/ records that were reviewed today include: Summarized above.    ASSESSMENT & PLAN:   1. DOE/CAD noted on CT - symptoms concerning for underlying CAD contributing to DOE as anginal equivalent. Dr. [name] recommends cath and I agree. Risks and benefits of cardiac catheterization have been discussed with the patient.  These include bleeding, infection, kidney damage, stroke, heart attack, death. The patient understands these risks and is willing to proceed. Note prior history of subclavian stenosis with regard to vascular access. Check pre-cath labs. Will continue current regimen. Of note she has been on full dose ASA/Plavix but this has come at the direction of Dr. Estanislado Pandy managing #4. 2. Paroxysmal SVT - quiescent, continue current regimen. 3. Essential HTN - BP minimally above goal but recent BP last month was 120/62. Follow for now. Depending on cath report may end up with titration of medical therapy so will hold off on any changes for now. 4. H/o carotid artery disease and subclavian stenosis - followed by Dr. Bronson Curb, pt recently asked to contact his office. She asked I send a copy of this note to him to keep him informed of events. Will route upon signing.  Disposition: F/u with Dr. Betsy Pries team APP 2 weeks post-cath.   Medication Adjustments/Labs and Tests Ordered: Current medicines are reviewed at length with the patient today.  Concerns regarding medicines are outlined above. Medication changes, Labs and Tests ordered today are summarized above and listed in the Patient Instructions accessible in Encounters.   Signed, Charlie Pitter, PA-C  11/07/2016 12:24 PM    [facility] [place], [place], [place]  [address] Phone: [phone]; Fax: [phone]

## 2016-11-10 NOTE — Research (Signed)
OPTIMIZE Informed Consent   Subject Name: Susan Gillespie  Subject met inclusion and exclusion criteria.  The informed consent form, study requirements and expectations were reviewed with the subject and questions and concerns were addressed prior to the signing of the consent form.  The subject verbalized understanding of the trail requirements.  The subject agreed to participate in the OPTIMIZE trial and signed the informed consent.  The informed consent was obtained prior to performance of any protocol-specific procedures for the subject.  A copy of the signed informed consent was given to the subject and a copy was placed in the subject's medical record. Only applicable if randomized.   Philemon Kingdom D 11/10/2016, 0900 AM

## 2016-11-14 ENCOUNTER — Other Ambulatory Visit (HOSPITAL_COMMUNITY): Payer: Self-pay | Admitting: Interventional Radiology

## 2016-11-14 ENCOUNTER — Telehealth (HOSPITAL_COMMUNITY): Payer: Self-pay

## 2016-11-14 DIAGNOSIS — I771 Stricture of artery: Secondary | ICD-10-CM

## 2016-11-14 NOTE — Telephone Encounter (Signed)
Called to schedule 6 month f/u US carotid, left message for pt to return call. AW

## 2016-11-18 ENCOUNTER — Encounter: Payer: Self-pay | Admitting: Physician Assistant

## 2016-11-18 DIAGNOSIS — M25531 Pain in right wrist: Secondary | ICD-10-CM | POA: Diagnosis not present

## 2016-11-18 NOTE — Progress Notes (Signed)
Cardiology Office Note    Date:  11/20/2016  ID:  Susan Gillespie, DOB 14-Feb-1946, MRN [id number] PCP:  [name], MD  Cardiologist:  Dr. [name]   Chief Complaint: f/u cath  History of Present Illness:  Susan Gillespie is a 71 y.o. 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  To recap, she has a history of left subclavian steal. She underwent stenting by Dr. Estanislado Pandy and then repeat angioplasty on 01/05/2016 down to 80-85% patency. Repeat dopplers in 05/2016 showed 1-39% right and 40-59% left carotid stenosis with some evidence of bidirectional flow in the left subclavian artery. She has a family history of CAD and has had CP in the past with normal nuclear stress test. At [place] 08/2016 she reported having more fatigue in her left arm along with achiness similar to prior subclavian steal symptoms. She was asked to contact Dr. Estanislado Pandy to discuss. She's also been recently followed by Dr. 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 She recently reported exertional DOE. Cardiac CT was arranged 10/26/16 which showed calcium score 92nd percentile for age/sex, significant calcific 3V disease with possibly hemodynamically significant stenosis in mid RCA and mLAD; subsequent FFR overread showed functionally significant D1 disease. She underwent LHC 11/10/16 showing moderate to moderately severe 3v calcific disease with anatomy and obstruction similar to that reported on cor CTA - Proximal 50-60% LAD, 70% proximal first diagonal, 50-60% prox-mid Cx ostial 60% and mid 50% RCa,  normal LV function with elevated EDP c/w chronic diastolic CHF. Medical therapy was recommended. Labs 11/07/16 showed K 5.0, Cr 0.89, calcium 10.5, Hgb 12.2 -> advised to f/u PCP for minimally elevated glucose of 104 and calcium level, and to discuss d/c of Celebrex and estrogen patch with prescribing provider. Prior LDL was 38 in 08/2016.  She presents back for follow-up today to discuss her results. No med changes were made yet based on cath report. She continues to report that she experiences discomfort across her left shoulder and dyspnea with exertion. She is wondering whether or not she should go to [place] on a trip that she usually does annually. She is also inquiring about handicapped card. No chest pain, orthopnea, PND. She does have a h/o dizziness, particularly with recumbency or taking losartan all at once.    Past Medical History:  Diagnosis Date  . Anxiety   . Arthritis   . Bleeding tendency ([place]) 05/16/2012   Hx post op bleeding (epidural hematoma s/p ACDF '10; LLQ hematoma due to superficial vessel fascial bleed post colon resection; Normal platelet count; saw Dr. [name] '14  . CAD in native artery    a. mod to mod-severe CAD by cath 10/2016, med rx.  . Carotid stenosis, bilateral 08/31/2016   1-39% right and 40-59% left ICA stenosis by dopplers 05/2016  . Chronic diastolic CHF (congestive heart failure) ([place])   . Colon polyp   . Diverticulitis   . Family history of cardiovascular disease   . Fracture of right fibula   . GERD (gastroesophageal reflux disease)   . Headache(784.0)    mirgraines - history of  .  HOH (hard of hearing)    wears hearing aids  . Hyperlipidemia   . Hypertension   . IBS (irritable bowel syndrome)    followed by Dr. [name]  . Liver cyst   . Melanoma ([place])    removed from back 04/2013  . Shortness of breath dyspnea    with exertion  . Stroke ([place]) 06/2012   mild  . SVT (supraventricular tachycardia) ([place])   . Urinary urgency   . UTI (lower  urinary tract infection)    x 2 since June 2016    Past Surgical History:  Procedure Laterality Date  . ABDOMINAL HYSTERECTOMY    . ANGIOPLASTY     stent in subclavian LEFT 2016, angioplasty of stent 2017  . APPENDECTOMY    . BREAST EXCISIONAL BIOPSY Left    benign  . BREAST SURGERY     BIL breast augmentation  . CHOLECYSTECTOMY    . COLON SURGERY     Sigmoid Colectomy, returned for post op bleeding  . COLONOSCOPY W/ POLYPECTOMY    . COLONOSCOPY WITH PROPOFOL N/A 05/05/2016   Procedure: COLONOSCOPY WITH PROPOFOL;  Surgeon: Alphonsa Overall, MD;  Location: [facility];  Service: General;  Laterality: N/A;  . ESOPHAGOGASTRODUODENOSCOPY (EGD) WITH PROPOFOL N/A 05/05/2016   Procedure: ESOPHAGOGASTRODUODENOSCOPY (EGD) WITH PROPOFOL;  Surgeon: Alphonsa Overall, MD;  Location: [facility];  Service: General;  Laterality: N/A;  . EYE SURGERY     eyelid drooping fixed  . IR GENERIC HISTORICAL  12/24/2015   IR ANGIO INTRA EXTRACRAN SEL COM CAROTID INNOMINATE BILAT MOD SED 12/24/2015 Luanne Bras, MD [facility]  . IR GENERIC HISTORICAL  12/24/2015   IR ANGIOGRAM EXTREMITY LEFT 12/24/2015 Luanne Bras, MD [facility]  . IR GENERIC HISTORICAL  12/24/2015   IR ANGIO VERTEBRAL SEL VERTEBRAL UNI R MOD SED 12/24/2015 Luanne Bras, MD [facility]  . IR GENERIC HISTORICAL  01/05/2016   IR PTA NON CORO-LOWER EXTREM 01/05/2016 Luanne Bras, MD [facility]  . IR GENERIC HISTORICAL  03/07/2016   IR RADIOLOGIST EVAL & MGMT 03/07/2016 [facility]  . JOINT REPLACEMENT Left    thumb  . LEFT HEART CATH AND CORONARY ANGIOGRAPHY N/A 11/10/2016   Procedure: Left Heart Cath and Coronary Angiography;  Surgeon: Belva Crome, MD;  Location: [facility];  Service: Cardiovascular;  Laterality: N/A;  . MASTOID DEBRIDEMENT    . MASTOIDECTOMY REVISION    . RADIOLOGY WITH ANESTHESIA N/A 11/09/2014   Procedure: STENT PLACEMENT;  Surgeon: Luanne Bras, MD;  Location: [facility];  Service: Radiology;   Laterality: N/A;  . RADIOLOGY WITH ANESTHESIA N/A 05/31/2015   Procedure: RADIOLOGY WITH ANESTHESIA;  Surgeon: Luanne Bras, MD;  Location: [facility];  Service: Radiology;  Laterality: N/A;  . RADIOLOGY WITH ANESTHESIA N/A 01/05/2016   Procedure: RADIOLOGY WITH ANESTHESIA ANGIOPLASTY WITH STENTNG;  Surgeon: Luanne Bras, MD;  Location: [facility];  Service: Radiology;  Laterality: N/A;  . skin cancer excised  04/2013   Melonoma  . SPINE SURGERY     cervical fusion, returned to OR for post op  bleeding  . TONSILLECTOMY    . TONSILLECTOMY    . TUBAL LIGATION    . VAGINA SURGERY     anterior posterior repair    Current Medications: Current Meds  Medication Sig  . acetaminophen (TYLENOL) 650 MG CR tablet Take 650 mg by mouth daily as needed for pain.   Marland Kitchen ALPRAZolam (XANAX) 0.5 MG tablet Take 0.25 mg by mouth at bedtime as needed for  anxiety or sleep.   Marland Kitchen aspirin 325 MG tablet Take 1 tablet (325 mg total) by mouth daily.  . celecoxib (CELEBREX) 200 MG capsule Take 200 mg by mouth daily as needed for mild pain.  . cetirizine (ZYRTEC) 10 MG tablet Take 10 mg by mouth every morning.   . clopidogrel (PLAVIX) 75 MG tablet Take 1 tablet (75 mg total) by mouth daily.  Marland Kitchen diltiazem (DILACOR XR) 180 MG 24 hr capsule Take 180 mg by mouth at bedtime.  Marland Kitchen estradiol (VIVELLE-DOT) 0.05 MG/24HR patch Place 0.25 patches onto the skin once a week.   . famotidine (PEPCID) 40 MG tablet Take 40 mg by mouth at bedtime.   Marland Kitchen loperamide (IMODIUM A-D) 2 MG tablet Take 2 mg by mouth daily.  Marland Kitchen losartan (COZAAR) 50 MG tablet Take 50 mg by mouth 2 (two) times daily.   . metoprolol tartrate (LOPRESSOR) 50 MG tablet Take 50 mg by mouth daily as needed (SVT symptoms).  . nitroGLYCERIN (NITROSTAT) 0.4 MG SL tablet Place 1 tablet (0.4 mg total) under the tongue every 5 (five) minutes as needed for chest pain.  . potassium chloride (K-DUR) 10 MEQ tablet Take 10 mEq by mouth daily.  . rosuvastatin (CRESTOR) 20 MG tablet Take 20  mg by mouth at bedtime.   . SUMAtriptan (IMITREX) 50 MG tablet Take 50 mg by mouth every 2 (two) hours as needed for migraine. May repeat in 2 hours if headache persists or recurs.  . traZODone (DESYREL) 100 MG tablet Take 50 mg by mouth at bedtime.      Allergies:   Daypro [oxaprozin]; Lisinopril; Other; and Simvastatin   Social History   Social History  . Marital status: Divorced    Spouse name: N/A  . Number of children: 2  . Years of education: 14   Occupational History  . 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 Smoking status: Former Smoker    Packs/day: 2.00    Years: 22.00    Types: Cigarettes    Quit date: 04/23/1985  . Smokeless tobacco: Never Used  . Alcohol use 2.4 oz/week    4 Glasses of wine per week     Comment: 1 drink a  day  . Drug use: No  . Sexual activity: Yes    Birth control/ protection: Surgical   Other Topics Concern  . None   Social History Narrative   ** Merged History Encounter **       Domestic partner   Regular exercise-no   Right handed   Caffeine use-- 2 cups coffee daily, rare soda     Family History:  Family History  Problem Relation Age of Onset  . Hyperlipidemia Other   . Hypertension Other   . Cancer Other        lung, esophagus, stomach  . Stroke Other   . Heart disease Father   . Heart disease Sister   . Heart attack Sister   . Heart disease Brother   . Heart attack Brother   . Stroke Son     ROS:   Please see the history of present illness.  All other systems are reviewed and otherwise negative.    PHYSICAL EXAM:   VS:  BP 116/70   Pulse 70   Ht 5\' 3"  (1.6 m)   Wt 159 lb 9.6 oz (72.4 kg)   LMP  (LMP Unknown)   BMI 28.27 kg/m  BMI: Body mass index is 28.27 kg/m. GEN: Well nourished, well developed WF, in no acute distress  HEENT: normocephalic, atraumatic Neck: no JVD, carotid bruits, or masses Cardiac: RRR; no murmurs, rubs, or gallops, no edema.  Equal pulses UE/LE Respiratory: diminished BS at bases, no rales or rhonchi or wheezing, normal work of breathing GI: soft, nontender, nondistended, + BS MS: no deformity or atrophy  Skin: warm and dry, no rash. Right radial cath site without hematoma or ecchymosis; good pulse. Neuro:  Alert and Oriented x 3, Strength and sensation are intact, follows commands Psych: euthymic mood, full affect  Wt Readings from Last 3 Encounters:  11/20/16 159 lb 9.6 oz (72.4 kg)  11/10/16 160 lb (72.6 kg)  11/07/16 160 lb (72.6 kg)      Studies/Labs Reviewed:   EKG:  EKG was not ordered today.  Recent Labs: 08/31/2016: ALT 17 11/07/2016: BUN 14; Creatinine, Ser 0.89; Hemoglobin 12.2; Platelets 265; Potassium 5.0; Sodium 141   Lipid Panel    Component Value Date/Time   CHOL 147 08/31/2016 0912   TRIG 157 (H) 08/31/2016 0912   HDL 78 08/31/2016 0912   CHOLHDL 1.9 08/31/2016 0912   CHOLHDL 2 09/15/2013 0922   VLDL 38.8 09/15/2013 0922   LDLCALC 38 08/31/2016 0912    Additional studies/ records that were reviewed today include: Summarized above    ASSESSMENT & PLAN:   1. CAD - difficult to know if her shoulder discomfort/SOB are from angina, diastolic CHF or deconditioning. See below regarding diuretic. She also has f/u carotid duplex pending today per Dr. Estanislado Pandy - she reports she hasn't had any decreased pulses or significant fatigue in left arm recently, so she's not convinced sx are related to the subclavian disease. Nevertheless, she's following through with workup as planned. If no improvement in symptoms with diuretic, would consider trial of Imdur. We discussed her upcoming trip to [place]. I told her if she sees improvement over the next few months I would be comfortable with her going, under the premise that she obtain travel insurance (to cover transport back to [place] if needed), research how to seek emergency care as 911 may not work in that country, and have an idea of where the  local medical facility would be that she would present to if needed. I told her if she continues to feel poorly, she should consider deferring her trip. Reviewed importance of discussing estrogen patch with prescribing provider given dx of CAD. She rarely takes Celebrex (1x/month or less). Also discussed avoidance of imitrex if possible given CAD. 2. Probable chronic diastolic CHF - LVEDP elevated by cath. Will add Lasix 20mg  daily. She has h/o sensitivity to meds as well as dizziness with higher doses of losartan so will decrease losartan to 50mg  daily (down from 100mg ) to accommodate this change. Reviewed 2g sodium diet, 2L fluid restriction with patient. She inquired about handicapped sticker. I think given her multiple issues this is reasonable. Discussed long-term goal to remain active, but she does struggle with longer distances. 3. HTN - follow BP with med changes. 4. Paroxysmal SVT - quiescent on diltiazem.  Disposition: F/u with Dr. [name] in 3 months.  Medication Adjustments/Labs and Tests Ordered: Current medicines are reviewed at length with the patient today.  Concerns regarding medicines are outlined above. Medication changes, Labs and Tests ordered today are summarized above and listed in the Patient Instructions accessible in Encounters.   Signed, Charlie Pitter, PA-C  11/20/2016 10:48 AM    [facility] [place], [place], [place]  [address] Phone: [phone]; Fax: [phone]

## 2016-11-20 ENCOUNTER — Ambulatory Visit (HOSPITAL_COMMUNITY)
Admission: RE | Admit: 2016-11-20 | Discharge: 2016-11-20 | Disposition: A | Discharge status: Home / Self Care | Payer: PPO | Source: Ambulatory Visit | Attending: Interventional Radiology | Admitting: Interventional Radiology

## 2016-11-20 ENCOUNTER — Ambulatory Visit (INDEPENDENT_AMBULATORY_CARE_PROVIDER_SITE_OTHER): Payer: PPO | Admitting: Physician Assistant

## 2016-11-20 ENCOUNTER — Encounter: Payer: Self-pay | Admitting: Physician Assistant

## 2016-11-20 VITALS — BP 116/70 | HR 70 | Ht 63.0 in | Wt 159.6 lb

## 2016-11-20 DIAGNOSIS — I5032 Chronic diastolic (congestive) heart failure: Secondary | ICD-10-CM

## 2016-11-20 DIAGNOSIS — I2510 Atherosclerotic heart disease of native coronary artery without angina pectoris: Secondary | ICD-10-CM

## 2016-11-20 DIAGNOSIS — I771 Stricture of artery: Secondary | ICD-10-CM | POA: Diagnosis not present

## 2016-11-20 DIAGNOSIS — I10 Essential (primary) hypertension: Secondary | ICD-10-CM

## 2016-11-20 DIAGNOSIS — I471 Supraventricular tachycardia: Secondary | ICD-10-CM

## 2016-11-20 LAB — VAS US CAROTID
LCCADDIAS: -17 cm/s
LCCADSYS: -65 cm/s
LCCAPSYS: 135 cm/s
LEFT ECA DIAS: -43 cm/s
LEFT VERTEBRAL DIAS: 10 cm/s
LICADSYS: -99 cm/s
Left CCA prox dias: 24 cm/s
Left ICA dist dias: -27 cm/s
Left ICA prox dias: -57 cm/s
Left ICA prox sys: -232 cm/s
RCCADSYS: -101 cm/s
RCCAPDIAS: 18 cm/s
RCCAPSYS: 99 cm/s
RIGHT ECA DIAS: -18 cm/s
RIGHT VERTEBRAL DIAS: -20 cm/s

## 2016-11-20 LAB — BASIC METABOLIC PANEL
BUN / CREAT RATIO: 19 (ref 12–28)
BUN: 20 mg/dL (ref 8–27)
CO2: 22 mmol/L (ref 20–29)
Calcium: 9.7 mg/dL (ref 8.7–10.3)
Chloride: 101 mmol/L (ref 96–106)
Creatinine, Ser: 1.04 mg/dL — ABNORMAL HIGH (ref 0.57–1.00)
GFR calc Af Amer: 62 mL/min/{1.73_m2} (ref >59)
GFR calc non Af Amer: 54 mL/min/{1.73_m2} — ABNORMAL LOW (ref >59)
GLUCOSE: 108 mg/dL — AB (ref 65–99)
POTASSIUM: 4.6 mmol/L (ref 3.5–5.2)
SODIUM: 137 mmol/L (ref 134–144)

## 2016-11-20 MED ORDER — FUROSEMIDE 20 MG PO TABS: 20.0000 mg | ORAL_TABLET | Freq: Every day | ORAL | 11 refills | Status: DC

## 2016-11-20 NOTE — Progress Notes (Signed)
*  PRELIMINARY RESULTS* Vascular Ultrasound Carotid Duplex (Doppler) has been completed.  Preliminary findings: Findings consistent with a 1- 71 percent stenosis involving the right internal carotid artery. Right vertebral artery demonstrates antegrade flow.  Findings consistent with a high end 40 - 59 percent stenosis, borderline 60-79%, involving the left internal carotid artery. The left vertebral artery demonstrates retrograde flow.  Findings could be underestimated bilaterally due to large amount of calcified plaque.  Susan Gillespie 11/20/2016, 3:49 PM

## 2016-11-20 NOTE — Patient Instructions (Signed)
Medication Instructions:  Your physician has recommended you make the following change in your medication: 1.) START Lasix 20 mg daily. 2.) Decrease your Losartan to 50 mg once daily.   Labwork: Your physician recommends that you return for lab work today for a BMET and in one week for a BMET.   Testing/Procedures: Your physician has requested that you have an echocardiogram. Echocardiography is a painless test that uses sound waves to create images of your heart. It provides your doctor with information about the size and shape of your heart and how well your heart's chambers and valves are working. This procedure takes approximately one hour. There are no restrictions for this procedure.    Follow-Up: Your physician recommends that you schedule a follow-up appointment in: 3 months with Dr. [name].   Any Other Special Instructions Will Be Listed Below (If Applicable).     If you need a refill on your cardiac medications before your next appointment, please call your pharmacy.

## 2016-11-22 ENCOUNTER — Telehealth: Payer: Self-pay | Admitting: Cardiology

## 2016-11-22 NOTE — Telephone Encounter (Signed)
F/u message  Pt call requesting to speak with Rn about lab work . Please call back to discuss

## 2016-11-22 NOTE — Telephone Encounter (Signed)
Returned call to pt.  She has been made aware of her lab results. Pt will get repeat BMET 11/27/16  Pt verbalized understanding

## 2016-11-24 ENCOUNTER — Other Ambulatory Visit (HOSPITAL_COMMUNITY): Payer: PPO

## 2016-11-27 ENCOUNTER — Other Ambulatory Visit: Payer: Self-pay

## 2016-11-27 ENCOUNTER — Ambulatory Visit (HOSPITAL_COMMUNITY): Payer: PPO | Attending: Cardiovascular Disease

## 2016-11-27 ENCOUNTER — Other Ambulatory Visit: Payer: PPO | Admitting: *Deleted

## 2016-11-27 DIAGNOSIS — Z8673 Personal history of transient ischemic attack (TIA), and cerebral infarction without residual deficits: Secondary | ICD-10-CM | POA: Diagnosis not present

## 2016-11-27 DIAGNOSIS — I10 Essential (primary) hypertension: Secondary | ICD-10-CM

## 2016-11-27 DIAGNOSIS — I071 Rheumatic tricuspid insufficiency: Secondary | ICD-10-CM | POA: Insufficient documentation

## 2016-11-27 DIAGNOSIS — Z8249 Family history of ischemic heart disease and other diseases of the circulatory system: Secondary | ICD-10-CM | POA: Diagnosis not present

## 2016-11-27 DIAGNOSIS — Z87891 Personal history of nicotine dependence: Secondary | ICD-10-CM | POA: Diagnosis not present

## 2016-11-27 DIAGNOSIS — E785 Hyperlipidemia, unspecified: Secondary | ICD-10-CM | POA: Diagnosis not present

## 2016-11-27 DIAGNOSIS — I2510 Atherosclerotic heart disease of native coronary artery without angina pectoris: Secondary | ICD-10-CM | POA: Insufficient documentation

## 2016-11-27 DIAGNOSIS — I471 Supraventricular tachycardia: Secondary | ICD-10-CM | POA: Insufficient documentation

## 2016-11-27 DIAGNOSIS — I110 Hypertensive heart disease with heart failure: Secondary | ICD-10-CM | POA: Insufficient documentation

## 2016-11-27 DIAGNOSIS — I5032 Chronic diastolic (congestive) heart failure: Secondary | ICD-10-CM | POA: Insufficient documentation

## 2016-11-27 LAB — BASIC METABOLIC PANEL
BUN / CREAT RATIO: 13 (ref 12–28)
BUN: 13 mg/dL (ref 8–27)
CALCIUM: 9.5 mg/dL (ref 8.7–10.3)
CO2: 26 mmol/L (ref 20–29)
Chloride: 102 mmol/L (ref 96–106)
Creatinine, Ser: 0.98 mg/dL (ref 0.57–1.00)
GFR, EST AFRICAN AMERICAN: 67 mL/min/{1.73_m2} (ref >59)
GFR, EST NON AFRICAN AMERICAN: 58 mL/min/{1.73_m2} — AB (ref >59)
GLUCOSE: 108 mg/dL — AB (ref 65–99)
POTASSIUM: 4.1 mmol/L (ref 3.5–5.2)
SODIUM: 141 mmol/L (ref 134–144)

## 2016-11-28 ENCOUNTER — Telehealth: Payer: Self-pay | Admitting: *Deleted

## 2016-11-28 NOTE — Telephone Encounter (Signed)
Patient informed. 

## 2016-11-28 NOTE — Telephone Encounter (Signed)
-----   Message from Erma Heritage, [place] sent at 11/27/2016  5:00 PM EDT ----- Covering for Dayna - Please let the patient know her echocardiogram showed normal pumping function of the heart with an EF of 64-15% with diastolic dysfunction. Diastolic dysfunction likely secondary to long-standing history of HTN. No significant valve abnormalities.

## 2016-11-28 NOTE — Telephone Encounter (Signed)
-----   Message from Erma Heritage, [place] sent at 11/27/2016  4:57 PM EDT ----- Covering for Dayna - Please let the patient know her electrolytes and kidney function are within a normal range. Continue current medication regimen.

## 2016-11-30 ENCOUNTER — Encounter: Payer: Self-pay | Admitting: Physician Assistant

## 2016-11-30 ENCOUNTER — Other Ambulatory Visit: Payer: Self-pay | Admitting: Physician Assistant

## 2016-11-30 MED ORDER — FUROSEMIDE 20 MG PO TABS
20.0000 mg | ORAL_TABLET | Freq: Every day | ORAL | 1 refills | Status: DC
Start: 2016-11-30 — End: 2017-05-03

## 2017-01-22 ENCOUNTER — Encounter: Payer: Self-pay | Admitting: Cardiology

## 2017-02-02 DIAGNOSIS — L821 Other seborrheic keratosis: Secondary | ICD-10-CM | POA: Diagnosis not present

## 2017-02-02 DIAGNOSIS — Z1283 Encounter for screening for malignant neoplasm of skin: Secondary | ICD-10-CM | POA: Diagnosis not present

## 2017-02-02 DIAGNOSIS — Z85820 Personal history of malignant melanoma of skin: Secondary | ICD-10-CM | POA: Diagnosis not present

## 2017-02-02 DIAGNOSIS — Z08 Encounter for follow-up examination after completed treatment for malignant neoplasm: Secondary | ICD-10-CM | POA: Diagnosis not present

## 2017-02-02 DIAGNOSIS — B078 Other viral warts: Secondary | ICD-10-CM | POA: Diagnosis not present

## 2017-02-20 ENCOUNTER — Ambulatory Visit (INDEPENDENT_AMBULATORY_CARE_PROVIDER_SITE_OTHER): Payer: PPO | Admitting: Cardiology

## 2017-02-20 ENCOUNTER — Encounter: Payer: Self-pay | Admitting: Cardiology

## 2017-02-20 VITALS — BP 136/50 | HR 71 | Ht 63.0 in | Wt 159.6 lb

## 2017-02-20 DIAGNOSIS — I10 Essential (primary) hypertension: Secondary | ICD-10-CM

## 2017-02-20 DIAGNOSIS — I5032 Chronic diastolic (congestive) heart failure: Secondary | ICD-10-CM

## 2017-02-20 DIAGNOSIS — I2510 Atherosclerotic heart disease of native coronary artery without angina pectoris: Secondary | ICD-10-CM | POA: Diagnosis not present

## 2017-02-20 DIAGNOSIS — E785 Hyperlipidemia, unspecified: Secondary | ICD-10-CM | POA: Diagnosis not present

## 2017-02-20 DIAGNOSIS — I471 Supraventricular tachycardia: Secondary | ICD-10-CM | POA: Diagnosis not present

## 2017-02-20 DIAGNOSIS — I6523 Occlusion and stenosis of bilateral carotid arteries: Secondary | ICD-10-CM | POA: Diagnosis not present

## 2017-02-20 NOTE — Patient Instructions (Signed)

## 2017-02-20 NOTE — Progress Notes (Signed)
Cardiology Office Note:    Date:  02/20/2017   ID:  Susan Gillespie, DOB December 21, 1945, MRN [id number]  PCP:  Susan Small, MD  Cardiologist:  Susan Him, MD   Referring MD: Susan Small, MD   Chief Complaint  Patient presents with  . Coronary Artery Disease  . Hypertension  . Hyperlipidemia    History of Present Illness:    Susan Gillespie is a 71 y.o. female with a hx of paroxysmal supraventricular tachycardia. She also has a history of left subclavian steal. She underwent stenting by Dr. Judi Cong and then repeat angioplasty on 01/05/2016 down to 80-85% patency. Repeat dopplers in 05/2016 showed 1-39% right and 40-59% left carotid stenosis with some evidence of bidirectional flow in the left subclavian artery.  Cardiac CT for SOB 10/26/16 showed calcium score 92nd percentile for age/sex, significant calcific 3V disease with possibly hemodynamically significant stenosis in mid RCA and mLAD; subsequent FFR overread showed functionally significant D1 disease. She underwent LHC 11/10/16 showing moderate to moderately severe 3v calcific disease with anatomy and obstruction similar to that reported on cor CTA - Proximal 50-60% LAD, 70% proximal first diagonal, 50-60% prox-mid Cx ostial 60% and mid 50% RCa, normal LV function with elevated EDP c/w chronic diastolic CHF.  She is now on medical management.    She is here today for followup and is doing well.  She denies any chest pain or pressure, SOB, DOE, PND, orthopnea, LE edema, dizziness, palpitations or syncope. She is compliant with her meds and is tolerating meds with no SE.     Past Medical History:  Diagnosis Date  . Anxiety   . Arthritis   . Bleeding tendency ([place]) 05/16/2012   Hx post op bleeding (epidural hematoma s/p ACDF '10; LLQ hematoma due to superficial vessel fascial bleed post colon resection; Normal platelet count; saw Dr. [name] '14  . CAD in native artery    a. mod to mod-severe CAD by cath 10/2016, med rx.  . Carotid  stenosis, bilateral 08/31/2016   1-39% right and 40-59% left ICA stenosis by dopplers 05/2016  . Chronic diastolic CHF (congestive heart failure) ([place])   . Colon polyp   . Diverticulitis   . Family history of cardiovascular disease   . Fracture of right fibula   . GERD (gastroesophageal reflux disease)   . Headache(784.0)    mirgraines - history of  . HOH (hard of hearing)    wears hearing aids  . Hyperlipidemia   . Hypertension   . IBS (irritable bowel syndrome)    followed by Dr. [name]  . Liver cyst   . Melanoma ([place])    removed from back 04/2013  . Shortness of breath dyspnea    with exertion  . Stroke ([place]) 06/2012   mild  . SVT (supraventricular tachycardia) ([place])   . Urinary urgency   . UTI (lower urinary tract infection)    x 2 since June 2016    Past Surgical History:  Procedure Laterality Date  . ABDOMINAL HYSTERECTOMY    . ANGIOPLASTY     stent in subclavian LEFT 2016, angioplasty of stent 2017  . APPENDECTOMY    . BREAST EXCISIONAL BIOPSY Left    benign  . BREAST SURGERY     BIL breast augmentation  . CHOLECYSTECTOMY    . COLON SURGERY     Sigmoid Colectomy, returned for post op bleeding  . COLONOSCOPY W/ POLYPECTOMY    . EYE SURGERY     eyelid drooping fixed  .  IR GENERIC HISTORICAL  12/24/2015   IR ANGIO INTRA EXTRACRAN SEL COM CAROTID INNOMINATE BILAT MOD SED 12/24/2015 Luanne Bras, MD [facility]  . IR GENERIC HISTORICAL  12/24/2015   IR ANGIOGRAM EXTREMITY LEFT 12/24/2015 Luanne Bras, MD [facility]  . IR GENERIC HISTORICAL  12/24/2015   IR ANGIO VERTEBRAL SEL VERTEBRAL UNI R MOD SED 12/24/2015 Luanne Bras, MD [facility]  . IR GENERIC HISTORICAL  01/05/2016   IR PTA NON CORO-LOWER EXTREM 01/05/2016 Luanne Bras, MD [facility]  . IR GENERIC HISTORICAL  03/07/2016   IR RADIOLOGIST EVAL & MGMT 03/07/2016 [facility]  . JOINT REPLACEMENT Left    thumb  . MASTOID DEBRIDEMENT    . MASTOIDECTOMY REVISION    . skin cancer excised   04/2013   Melonoma  . SPINE SURGERY     cervical fusion, returned to OR for post op  bleeding  . TONSILLECTOMY    . TONSILLECTOMY    . TUBAL LIGATION    . VAGINA SURGERY     anterior posterior repair    Current Medications: Current Meds  Medication Sig  . acetaminophen (TYLENOL) 650 MG CR tablet Take 650 mg by mouth daily as needed for pain.   Marland Kitchen ALPRAZolam (XANAX) 0.5 MG tablet Take 0.25 mg by mouth at bedtime as needed for anxiety or sleep.   Marland Kitchen aspirin 325 MG tablet Take 1 tablet (325 mg total) by mouth daily.  . celecoxib (CELEBREX) 200 MG capsule Take 200 mg by mouth daily as needed for mild pain.  . cetirizine (ZYRTEC) 10 MG tablet Take 10 mg by mouth every morning.   . clopidogrel (PLAVIX) 75 MG tablet Take 1 tablet (75 mg total) by mouth daily.  Marland Kitchen diltiazem (DILACOR XR) 180 MG 24 hr capsule Take 180 mg by mouth at bedtime.  Marland Kitchen estradiol (VIVELLE-DOT) 0.05 MG/24HR patch Place 0.25 patches onto the skin once a week.   . famotidine (PEPCID) 40 MG tablet Take 40 mg by mouth at bedtime.   . furosemide (LASIX) 20 MG tablet Take 1 tablet (20 mg total) by mouth daily.  Marland Kitchen loperamide (IMODIUM A-D) 2 MG tablet Take 2 mg by mouth daily.  Marland Kitchen losartan (COZAAR) 50 MG tablet Take 50 mg by mouth daily.  . metoprolol tartrate (LOPRESSOR) 50 MG tablet Take 50 mg by mouth daily as needed (SVT symptoms).  . potassium chloride (K-DUR) 10 MEQ tablet Take 10 mEq by mouth daily.  . rosuvastatin (CRESTOR) 20 MG tablet Take 20 mg by mouth at bedtime.   . SUMAtriptan (IMITREX) 50 MG tablet Take 50 mg by mouth every 2 (two) hours as needed for migraine. May repeat in 2 hours if headache persists or recurs.  . traZODone (DESYREL) 100 MG tablet Take 50 mg by mouth at bedtime.      Allergies:   Daypro [oxaprozin]; Lisinopril; Other; and Simvastatin   Social History   Socioeconomic History  . Marital status: Divorced    Spouse name: None  . Number of children: 2  . Years of education: 44  . Highest  education level: None  Social Needs  . Financial resource strain: None  . Food insecurity - worry: None  . Food insecurity - inability: None  . Transportation needs - medical: None  . Transportation needs - non-medical: None  Occupational History  . 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 Smoking status: Former Smoker  Packs/day: 2.00    Years: 22.00    Pack years: 44.00    Types: Cigarettes    Last attempt to quit: 04/23/1985    Years since quitting: 31.8  . Smokeless tobacco: Never Used  Substance and Sexual Activity  . Alcohol use: Yes    Alcohol/week: 2.4 oz    Types: 4 Glasses of wine per week    Comment: 1 drink a  day  . Drug use: No  . Sexual activity: Yes    Birth control/protection: Surgical  Other Topics Concern  . None  Social History Narrative   ** Merged History Encounter **       Domestic partner   Regular exercise-no   Right handed   Caffeine use-- 2 cups coffee daily, rare soda     Family History: The patient's family history includes Cancer in her other; Heart attack in her brother and sister; Heart disease in her brother, father, and sister; Hyperlipidemia in her other; Hypertension in her other; Stroke in her other and son.  ROS:   Please see the history of present illness.    Review of Systems  Musculoskeletal: Positive for back pain and muscle cramps.    All other systems reviewed and negative.   EKGs/Labs/Other Studies Reviewed:    The following studies were reviewed today: none  EKG:  EKG is not ordered today.   Recent Labs: 08/31/2016: ALT 17 11/07/2016: Hemoglobin 12.2; Platelets 265 11/27/2016: BUN 13; Creatinine, Ser 0.98; Potassium 4.1; Sodium 141   Recent Lipid Panel    Component Value Date/Time   CHOL 147 08/31/2016 0912   TRIG 157 (H) 08/31/2016 0912   HDL 78 08/31/2016 0912   CHOLHDL 1.9 08/31/2016 0912   CHOLHDL 2 09/15/2013 0922   VLDL 38.8  09/15/2013 0922   LDLCALC 38 08/31/2016 0912    Physical Exam:    VS:  BP (!) 136/50   Pulse 71   Ht 5\' 3"  (1.6 m)   Wt 159 lb 9.6 oz (72.4 kg)   LMP  (LMP Unknown)   SpO2 95%   BMI 28.27 kg/m     Wt Readings from Last 3 Encounters:  02/20/17 159 lb 9.6 oz (72.4 kg)  11/20/16 159 lb 9.6 oz (72.4 kg)  11/10/16 160 lb (72.6 kg)     GEN:  Well nourished, well developed in no acute distress HEENT: Normal NECK: No JVD; No carotid bruits LYMPHATICS: No lymphadenopathy CARDIAC: RRR, no murmurs, rubs, gallops RESPIRATORY:  Clear to auscultation without rales, wheezing or rhonchi  ABDOMEN: Soft, non-tender, non-distended MUSCULOSKELETAL:  No edema; No deformity  SKIN: Warm and dry NEUROLOGIC:  Alert and oriented x 3 PSYCHIATRIC:  Normal affect   ASSESSMENT:    1. CAD in native artery   2. Carotid stenosis, bilateral   3. Paroxysmal supraventricular tachycardia ([place])   4. Essential hypertension, benign   5. Chronic diastolic CHF (congestive heart failure), NYHA class 1 ([place])   6. Hyperlipidemia LDL goal <70    PLAN:    In order of problems listed above:  1.  ASCAD -cath 10/2016 showed moderate to moderately severe three-vessel calcific coronary artery disease with proximal LAD 50-60%, proximal first diagonal 70%, 50-60% proximal left circumflex, 60% ostial and 50%mid RCA stenosis.  She denies any  further anginal pain.  She will continue on aspirin 3 and 25 mg daily, Plavix 75 mg daily and statin.  2.  Bilateral carotid artery stenosis followed by Dr. Kathryne Eriksson.  She will continue on aspirin  and statin as well as Plavix.  She says she has been having more reflux recently and had been on Nexium in the past but says that she had been told not to take that because of her Plavix.  I encouraged her to talk to her primary about switching her to Protonix from Prilosec.  3.  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  4.  Hypertension.  Her blood pressure is well  controlled on exam today.  She will continue on losartan 50 mg daily and Cardizem Exar 180 mg daily.  5.  Chronic diastolic heart failure with elevated LVEDP at the time of her cath.  She denies any shortness of breath or lower extremity edema.  She will continue on her current dose of Lasix 20 mg daily.  6.  Hyperlipidemia with LDL goal less than 70.  Her last LDL was at goal at 38.  She will continue on rosuvastatin 20 mg daily.   Medication Adjustments/Labs and Tests Ordered: Current medicines are reviewed at length with the patient today.  Concerns regarding medicines are outlined above.  No orders of the defined types were placed in this encounter.  No orders of the defined types were placed in this encounter.   Signed, Susan Him, MD  02/20/2017 11:07 AM    [place]

## 2017-02-21 ENCOUNTER — Encounter: Payer: Self-pay | Admitting: Neurology

## 2017-02-21 ENCOUNTER — Ambulatory Visit: Payer: PPO | Admitting: Neurology

## 2017-02-21 VITALS — BP 137/62 | HR 86 | Wt 161.0 lb

## 2017-02-21 DIAGNOSIS — R413 Other amnesia: Secondary | ICD-10-CM

## 2017-02-21 DIAGNOSIS — I871 Compression of vein: Secondary | ICD-10-CM | POA: Diagnosis not present

## 2017-02-21 NOTE — Progress Notes (Signed)
[facility] [address] [place]. [place] [address] [phone]       OFFICE FOLLOW UP VISIT NOTE  Susan. Susan Gillespie Date of Birth:  [birth year] Medical Record Number:  [id number]   Referring MD:  [name]  Reason for Referral:  Headache  HPI: Susan Gillespie is a pleasant 71 year old lady whose had intermittent recurrent stereotypical episodes of right-sided head abnormal pulsation or rumbling sensation on the right side of the head. This started 3 months ago and she's had multiple episodes in the last episode was 2 weeks ago. This may last for variable period of time from 5-6 minutes to up to 5-6 hours. There are no specific triggers and the episodes can occur at any time or with any activities. She finds annoying though she states it is not painful. She feels the sensation is similar to horses galloping. In only 2 of these episodes she has had headache later but not during the episode. She does have remote history of migraine headaches but feels that she outgrew them during her menopause. She's had however 2 episodes of migraines in the last few months but feels they were unrelated to these other episodes. She denies any competing symptoms in the, speech difficulty, vision problems, vertigo, diplopia, gait, balance problems or focal weakness or numbness. She feels she is not anxious during these episodes though she does take Xanax when necessary about twice a week for an unrelated anxiety. She has history of SVTs but feels she has taken her pulse during these episodes and it is not high. 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 Carotid ultrasound and transthoracic echo were unremarkable. Vascular risk factors identified included hypertension and hyperlipidemia. She had previously been on aspirin which was changed to Plavix but presently  she is on aspirin 81 mg due to easy bruising. She states her blood pressure is well controlled though it is slightly elevated at 148/66 in office today. She is on aspirin 81 mg which is tolerating well. She is also on Crestor and had lipid profile checked a few months ago and it was fine. She is not having any significant myalgias or arthralgias. Update 12/01/2014 : She returns for follow-up after initial consultation with me 2 months ago. She had MRI scan of the brain on 09/25/14 which I personally reviewed showed no acute abnormality. MRA of the brain showed no large vessel intracranial stenosis. 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 These changes were much advanced compared with previous MRA from June 2014. 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 After discussion of risks benefits patient agreed to angioplasty stenting and hence was referred to Dr. Estanislado Pandy who perform this on 11/09/14 uneventfully. Patient has been on aspirin and Plavix since then and is tolerating it well with only minor bruising. She states that hers symptoms of right hemicranial pulsations have since disappeared and she's had no episodes since her stenting. She saw Dr. Estanislado Pandy on 11/24/14 and had platelet reactivity testing done but the results are pending. She was advised to have repeat carotid and subclavian ultrasound in 4 months. Update 02/23/2016 : She returns for follow-up after last visit a year ago. She was doing well until September when she started  having   dizziness  when she went to have her hair washed and also felt left upper extremity was fatiguedand found her left radial pulse to be diminished. She was seen by Dr. 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 She underwent elective angioplasty 01/10/16 for restenosis did well   Initially but a week later she developed positional vertigo again which lasted about a month and has now gradually improved. She  is on aspirin and Plavix and having bruising but no major bleeding. She is also having some bladder spasms which occasionally awaken her from sleep. She states her blood pressure is good today it is 145/73. She does occasionally feel off balance but and leans to the right but has had no falls or injuries.  Update 02/21/2017 ; she returns for follow-up after last visit a year ago.  She is accompanied by her daughter.  Patient states she is doing well she did not had an recurrent stroke or TIA symptoms.  She has seen a cardiologist couple of times and been started on Lasix for congestive heart failure.  She is also been started on diltiazem for SVT.  She has not new complaint of memory loss which she has had for a year or so.  She feels it may be slowly getting worse.  At times she has trouble remembering names but may remember later.  She can get easily distracted.  She is still functionally fully independent and managing her own affairs.  She does bruise and bleed easily and remains on dual antiplatelet therapy.  She plans to see Dr. Estanislado Pandy and discuss if she can come off the aspirin. ROS:   14 system review of systems is positive for   memory loss easy bruising and bleeding and all other systems negative  PMH:  Past Medical History:  Diagnosis Date  . Anxiety   . Arthritis   . Bleeding tendency ([place]) 05/16/2012   Hx post op bleeding (epidural hematoma s/p ACDF '10; LLQ hematoma due to superficial vessel fascial bleed post colon resection; Normal platelet count; saw Dr. [name] '14  . CAD in native artery    a. mod to mod-severe CAD by cath 10/2016, med rx.  . Carotid stenosis, bilateral 08/31/2016   1-39% right and 40-59% left ICA stenosis by dopplers 05/2016  . Chronic diastolic CHF (congestive heart failure) ([place])   . Colon polyp   . Diverticulitis   . Family  history of cardiovascular disease   . Fracture of right fibula   . GERD (gastroesophageal reflux disease)   . Headache(784.0)    mirgraines - history of  . HOH (hard of hearing)    wears hearing aids  . Hyperlipidemia   . Hypertension   . IBS (irritable bowel syndrome)    followed by Dr. [name]  . Liver cyst   . Melanoma ([place])    removed from back 04/2013  . Shortness of breath dyspnea    with exertion  . Stroke ([place]) 06/2012   mild  . SVT (supraventricular tachycardia) ([place])   . Urinary urgency   . UTI (lower urinary tract infection)    x 2 since June 2016    Social History:  Social History   Socioeconomic History  . Marital status: Divorced    Spouse name: Not on file  . Number of children: 2  . Years of education: 89  . Highest education level: Not on file  Social Needs  . Financial  resource strain: Not on file  . Food insecurity - worry: Not on file  . Food insecurity - inability: Not on file  . Transportation needs - medical: Not on file  . Transportation needs - non-medical: Not on file  Occupational History  . 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 Smoking status: Former Smoker    Packs/day: 2.00    Years: 22.00    Pack years: 44.00    Types: Cigarettes    Last attempt to quit: 04/23/1985    Years since quitting: 31.8  . Smokeless tobacco: Never Used  Substance and Sexual Activity  . Alcohol use: Yes    Alcohol/week: 2.4 oz    Types: 4 Glasses of wine per week    Comment: 1 drink a  day  . Drug use: No  . Sexual activity: Yes    Birth control/protection: Surgical  Other Topics Concern  . Not on file  Social History Narrative   ** Merged History Encounter **       Domestic partner   Regular exercise-no   Right handed   Caffeine use-- 2 cups coffee daily, rare soda    Medications:   Current Outpatient Medications on File Prior to Visit  Medication Sig Dispense Refill    . acetaminophen (TYLENOL) 650 MG CR tablet Take 650 mg by mouth daily as needed for pain.     Marland Kitchen ALPRAZolam (XANAX) 0.5 MG tablet Take 0.25 mg by mouth at bedtime as needed for anxiety or sleep.     Marland Kitchen aspirin 325 MG tablet Take 1 tablet (325 mg total) by mouth daily. 30 tablet 0  . celecoxib (CELEBREX) 200 MG capsule Take 200 mg by mouth daily as needed for mild pain.    . cetirizine (ZYRTEC) 10 MG tablet Take 10 mg by mouth every morning.     . clopidogrel (PLAVIX) 75 MG tablet Take 1 tablet (75 mg total) by mouth daily. 30 tablet 2  . diltiazem (DILACOR XR) 180 MG 24 hr capsule Take 180 mg by mouth at bedtime.    Marland Kitchen estradiol (VIVELLE-DOT) 0.05 MG/24HR patch Place 0.25 patches onto the skin once a week.   3  . famotidine (PEPCID) 40 MG tablet Take 40 mg by mouth at bedtime.     . furosemide (LASIX) 20 MG tablet Take 1 tablet (20 mg total) by mouth daily. 90 tablet 1  . loperamide (IMODIUM A-D) 2 MG tablet Take 2 mg by mouth daily.    Marland Kitchen losartan (COZAAR) 50 MG tablet Take 50 mg by mouth daily.  3  . metoprolol tartrate (LOPRESSOR) 50 MG tablet Take 50 mg by mouth daily as needed (SVT symptoms).    . nitroGLYCERIN (NITROSTAT) 0.4 MG SL tablet Place 1 tablet (0.4 mg total) under the tongue every 5 (five) minutes as needed for chest pain. 25 tablet 3  . potassium chloride (K-DUR) 10 MEQ tablet Take 10 mEq by mouth daily.    . rosuvastatin (CRESTOR) 20 MG tablet Take 20 mg by mouth at bedtime.     . SUMAtriptan (IMITREX) 50 MG tablet Take 50 mg by mouth every 2 (two) hours as needed for migraine. May repeat in 2 hours if headache persists or recurs.    . traZODone (DESYREL) 100 MG tablet Take 50 mg by mouth at bedtime.      No current facility-administered medications on file prior to visit.  Allergies:   Allergies  Allergen Reactions  . Daypro [Oxaprozin] Swelling    facial  . Lisinopril     cough  . Other Itching    narcotics  . Simvastatin     REACTION: myalgias and fatigue     Physical Exam General: well developed, well nourished middle-aged Caucasian lady, seated, in no evident distress Head: head normocephalic and atraumatic.   Neck: supple with no carotid or supraclavicular bruits Cardiovascular: regular rate and rhythm, no murmurs Musculoskeletal: no deformity Skin:  no rash/petichiae.   Vascular:  Normal pulses all extremities. Adson test negative  Neurologic Exam Mental Status: Awake and fully alert. Oriented to place and time. Recent and remote memory intact. Attention span, concentration and fund of knowledge appropriate. Mood and affect appropriate.  Recall 3/3.  Animal naming test 15.  Clock drawing 4/4.  Able to copy intersecting pentagons. Cranial Nerves: Fundoscopic exam reveals sharp disc margins. Pupils equal, briskly reactive to light. Extraocular movements full without nystagmus. Visual fields full to confrontation. Hearing diminished on the right despite a hearing aid.. Facial sensation intact. Face, tongue, palate moves normally and symmetrically.  Motor: Normal bulk and tone. Normal strength in all tested extremity muscles. Sensory.: intact to touch , pinprick , position and vibratory sensation.  Coordination: Rapid alternating movements normal in all extremities. Finger-to-nose and heel-to-shin performed accurately bilaterally. Gait and Station: Arises from chair without difficulty. Stance is normal. Gait demonstrates normal stride length and balance . Able to heel, toe and tandem walk without difficulty.  Reflexes: 1+ and symmetric. Toes downgoing.      ASSESSMENT: 2 year lady with transient recurrent episodes of right hemicranial pulsations, rumbling sensation of unclear etiology. She was found to have severe left subclavian origin stenosis status post endovascular revascularization of severe symptomatic stenosis of the left subclavian artery with stent assisted angioplasty  11/09/14  with recurrent symptoms and restenosis requiring repeat  angioplasty in September 2017 .  She has mild subjective memory difficulties but did fairly well on cognitive testing   PLAN: I had a long d/w patient about her subclavian stenosis and mild memory loss, risk for recurrent stroke/TIAs, personally independently reviewed imaging studies and stroke evaluation results and answered questions.Continue aspirin 325 mg daily and clopidogrel 75 mg daily  for secondary stroke prevention and maintain strict control of hypertension with blood pressure goal below 130/90, diabetes with hemoglobin A1c goal below 6.5% and lipids with LDL cholesterol goal below 70 mg/dL. I also advised the patient to eat a healthy diet with plenty of whole grains, cereals, fruits and vegetables, exercise regularly and maintain ideal body weight .recommend she start taking fish oil 1200 mg daily for memory impairment as well as increased participation in activities which are cognitively challenging tasks like playing bridge sudoku online games.  We also discussed memory compensation strategies.  I advised her to discuss with Dr. Estanislado Pandy if she could come off the aspirin since she is having significant bruising and has been on dual antiplatelet therapy for more than a year followup in the future with me in 1 year or call earlier if necessary.Greater than 50% time during this 35 minute visit was spent on counseling and coordination of care about vertebrobasilar steal, stroke and TIA .Followup in the future with me in  1 year or call earlier if necessary.Antony Contras, MD Note: This document was prepared with digital dictation and possible smart phrase technology. Any transcriptional errors that result from this process are unintentional.

## 2017-02-21 NOTE — Patient Instructions (Signed)
I had a long d/w patient about her subclavian stenosis and mild memory loss, risk for recurrent stroke/TIAs, personally independently reviewed imaging studies and stroke evaluation results and answered questions.Continue aspirin 325 mg daily and clopidogrel 75 mg daily  for secondary stroke prevention and maintain strict control of hypertension with blood pressure goal below 130/90, diabetes with hemoglobin A1c goal below 6.5% and lipids with LDL cholesterol goal below 70 mg/dL. I also advised the patient to eat a healthy diet with plenty of whole grains, cereals, fruits and vegetables, exercise regularly and maintain ideal body weight .recommend she start taking fish oil 1200 mg daily for memory impairment as well as increased participation in activities which are cognitively challenging tasks like playing bridge sudoku online games.  We also discussed memory compensation strategies.  I advised her to discuss with Dr. Estanislado Pandy if she could come off the aspirin since she is having significant bruising and has been on dual antiplatelet therapy for more than a year followup in the future with me in 1 year or call earlier if necessary Memory Compensation Strategies  1. Use "WARM" strategy.  W= write it down  A= associate it  R= repeat it  M= make a mental note  2.   You can keep a Social worker.  Use a 3-ring notebook with sections for the following: calendar, important names and phone numbers,  medications, doctors' names/phone numbers, lists/reminders, and a section to journal what you did  each day.   3.    Use a calendar to write appointments down.  4.    Write yourself a schedule for the day.  This can be placed on the calendar or in a separate section of the Memory Notebook.  Keeping a  regular schedule can help memory.  5.    Use medication organizer with sections for each day or morning/evening pills.  You may need help loading it  6.    Keep a basket, or pegboard by the door.  Place  items that you need to take out with you in the basket or on the pegboard.  You may also want to  include a message board for reminders.  7.    Use sticky notes.  Place sticky notes with reminders in a place where the task is performed.  For example: " turn off the  stove" placed by the stove, "lock the door" placed on the door at eye level, " take your medications" on  the bathroom mirror or by the place where you normally take your medications.  8.    Use alarms/timers.  Use while cooking to remind yourself to check on food or as a reminder to take your medicine, or as a  reminder to make a call, or as a reminder to perform another task, etc.

## 2017-02-22 IMAGING — XA IR VERTEBRAL  SELECTIVE UNILAT RIGHT (MS)
1 series · 12 of 24 positions shown · IV contrast (IODINE)
Comparison: none

CLINICAL DATA: Recurrence of vertebrobasilar insufficiency symptoms
of gait unsteadiness and left arm pain. Unequal blood pressures in
both arms.

[Series 300: dr. (person_name) · 12 of 155 slices shown]
[im 7/155]
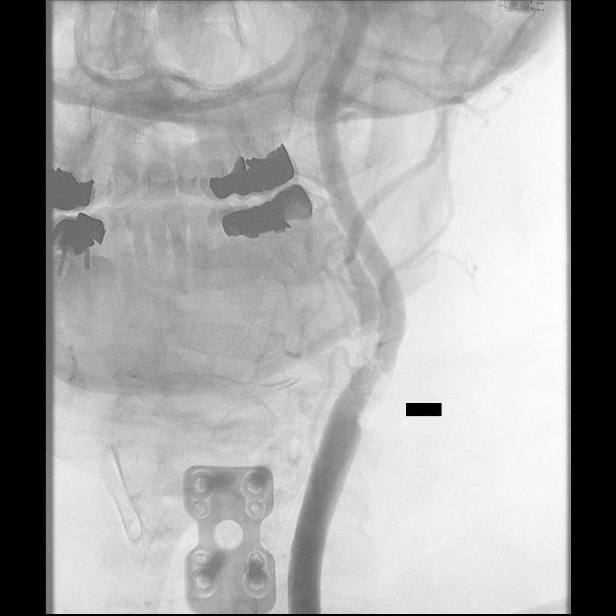
[im 21/155]
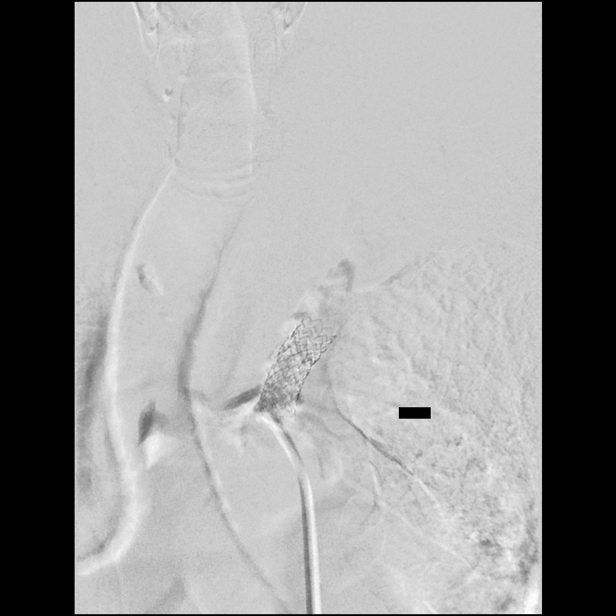
[im 34/155]
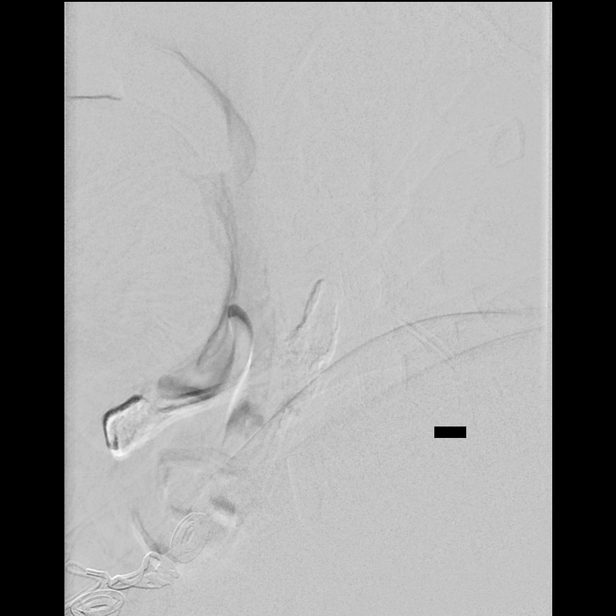
[im 47/155]
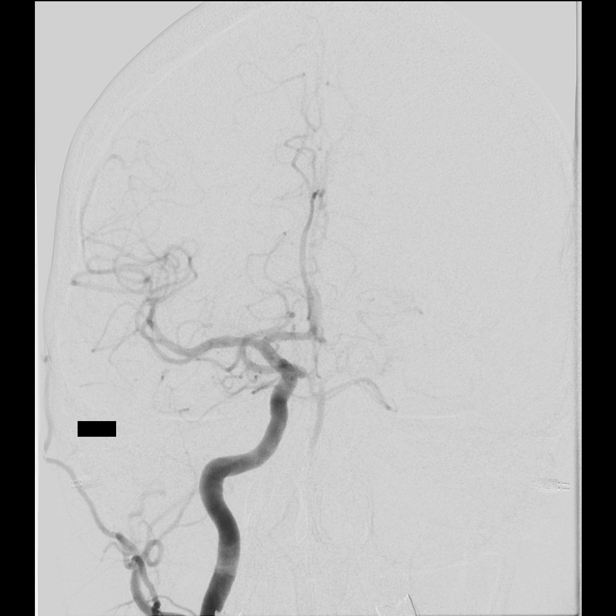
[im 61/155]
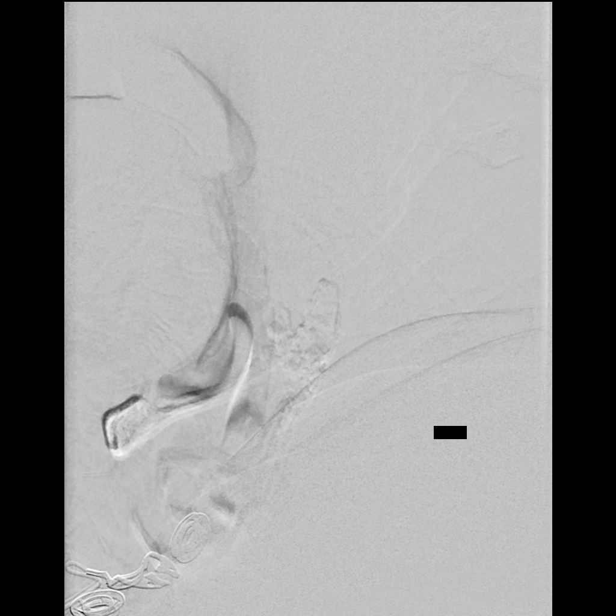
[im 74/155]
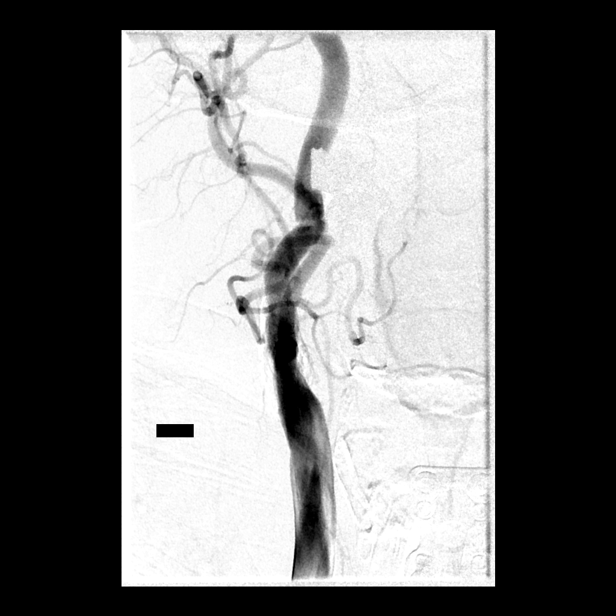
[im 88/155]
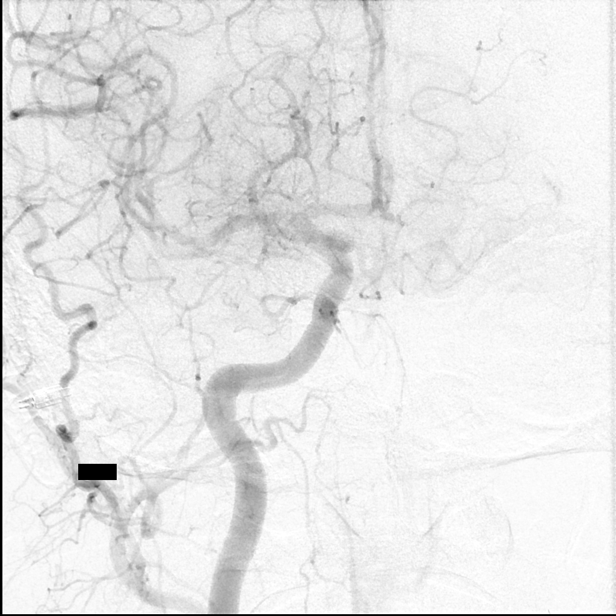
[im 101/155]
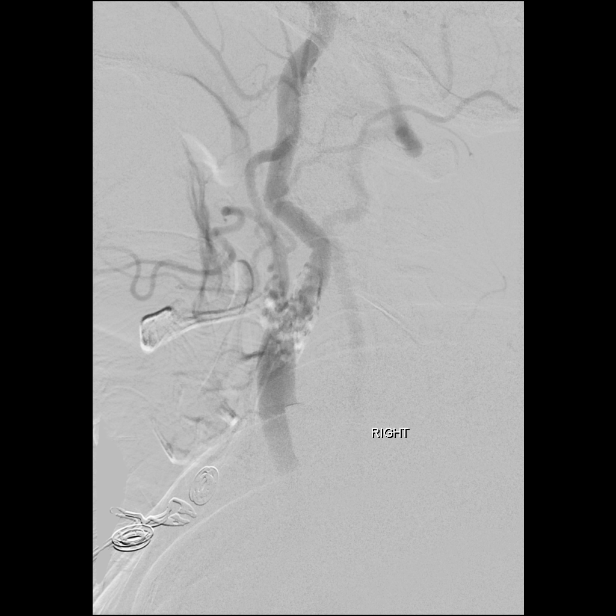
[im 114/155]
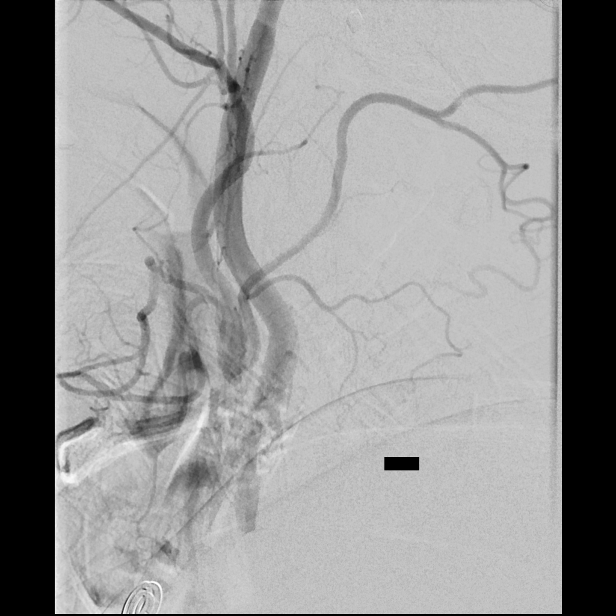
[im 128/155]
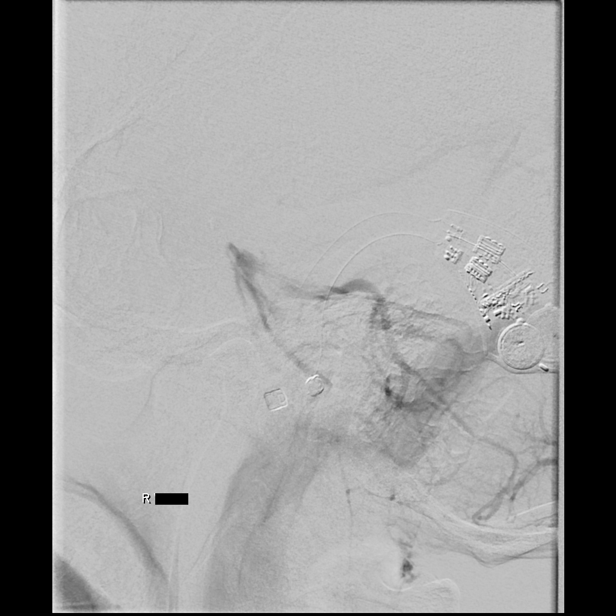
[im 141/155]
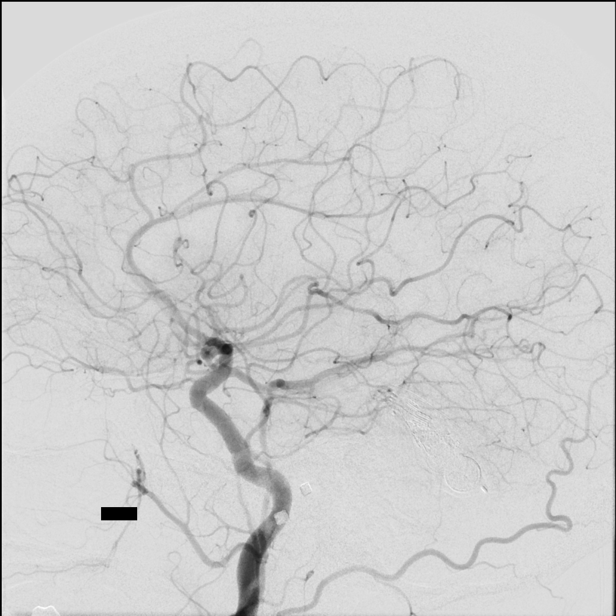
[im 155/155]
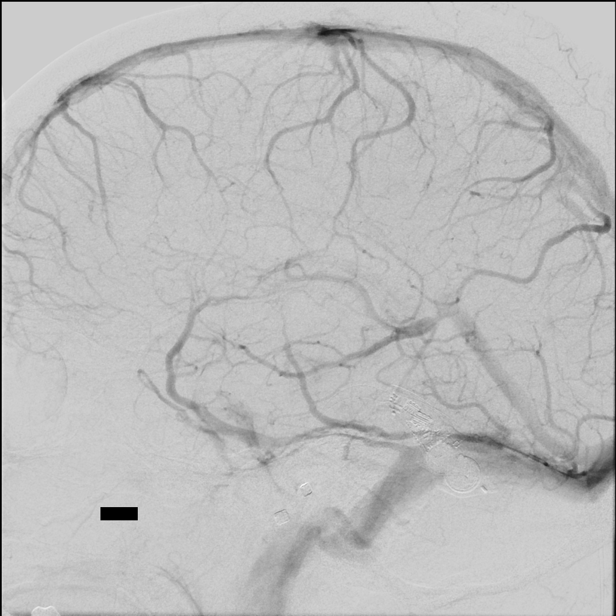

[12 of 24 positions shown; findings below may reference images not displayed]

EXAM:
BILATERAL COMMON CAROTID AND INNOMINATE ANGIOGRAPHY AND RIGHT
VERTEBRAL ARTERY ANGIOGRAM AND LEFT SUBCLAVIAN ARTERIOGRAM:

PROCEDURE:
Contrast: 50mL OMNIPAQUE IOHEXOL 300 MG/ML  SOLN

Anesthesia/Sedation:  Conscious sedation.

Medications: Versed 1 mg IV.  Fentanyl 25 mcg IV.

Following a full explanation of the procedure along with the
potential associated complications, an informed witnessed consent
was obtained.

The right groin was prepped and draped in the usual sterile fashion.
Thereafter using modified Seldinger technique, transfemoral access
into the right common femoral artery was obtained without
difficulty. Over a 0.035 inch guidewire, a 5 French Pinnacle sheath
was inserted. Through this, and also over 0.035 inch guidewire, a 5
French JB1 catheter was advanced to the aortic arch region and
selectively positioned in the right common carotid artery, the right
vertebral artery, the left common carotid artery and the left
subclavian artery.

There were no acute complications. The patient tolerated the
procedure well.
FINDINGS: The right common carotid arteriogram demonstrates the right external
carotid artery and its major branches to be widely patent.

Also noted is minimal narrowing in the right internal carotid artery
at the bulb without acute ulcerations.

Extensive calcific atherosclerotic plaque is noted at the right
common carotid artery bifurcation.

Right internal carotid artery otherwise is seen to opacify normally
to the cranial skull base.

The petrous, the cavernous and the supraclinoid segments are widely
patent.

The right middle and right anterior cerebral arteries opacify
normally into the capillary and the venous phases. Transient cross
opacification via the anterior communicating artery of the left
anterior cerebral artery A2 segment is noted.

Also noted is a prominent left posterior communicating artery
opacifying the left posterior cerebral artery distribution.

Also noted is a retrograde opacification of the distal basilar
artery to the level of the superior cerebellar arteries with
antegrade clearance of the contrast.

The right vertebral artery origin is normal. The vessel is seen to
opacify normally to the cranial skull base. Normal opacification is
seen of the right vertebrobasilar junction and the right posterior
inferior cerebellar artery.

Opacification is seen into the distal basilar artery, the left
posterior cerebral arteries, the superior cerebellar arteries and
the anterior-inferior cerebellar arteries into the capillary and the
venous phases.

Also demonstrated is retrograde opacification of the left vertebral
artery from distal to proximal consistent with subclavian steal.

Also incomplete opacification of the right posterior cerebral artery
is seen secondary to inflow from the anterior circulation as
described above.

The left common carotid arteriogram demonstrates a severe focal
narrowing of the left external carotid artery with mild post
stenotic dilatation secondary to calcified atherosclerotic plaque.
The left internal carotid artery at the bulb demonstrates about a
50% stenoses secondary to calcified atherosclerotic plaque. No acute
ulcerations are seen.

The vessel is otherwise seen to opacify normally to the cranial
skull base. The intracranial interrogation was not examined. The
left subclavian arteriogram demonstrates near complete occlusion of
the stented segment of the proximal left subclavian artery.

Antegrade flow distally is noted into the subclavian artery and
axillary artery. There is back and forth opacification of the origin
and the proximal aspect of the left vertebral artery consistent with
subclavian steal syndrome.
IMPRESSION: Severe intra stent stenoses of the stented segment of the proximal
left subclavian artery, associated with left subclavian steal
phenomenon as described above.

Approximately 50% stenosis of the left internal carotid artery
proximally just distal to the bulb associated with extensive
calcific plaque without intraluminal irregularity.

The left internal carotid artery intracranial extension was not
interrogated.

The above findings were reviewed with the patient and the patient's
spouse. She was advised to resume her Plavix 75 mg a day and
continue with her aspirin. She was also given 150 mg of Plavix right
after the procedure.

She was advised to maintain adequate hydration. Also following a
discussion with her and her husband, it was decided to proceed with
endovascular revascularization of nearly occluded left subclavian
artery in its stented portion. The procedure will be scheduled at
the earliest possible.

## 2017-05-03 ENCOUNTER — Other Ambulatory Visit: Payer: Self-pay | Admitting: Physician Assistant

## 2017-05-17 ENCOUNTER — Other Ambulatory Visit (HOSPITAL_COMMUNITY): Payer: Self-pay | Admitting: Interventional Radiology

## 2017-05-17 ENCOUNTER — Telehealth (HOSPITAL_COMMUNITY): Payer: Self-pay

## 2017-05-17 DIAGNOSIS — I771 Stricture of artery: Secondary | ICD-10-CM

## 2017-05-17 NOTE — Telephone Encounter (Signed)
Called to schedule 6 month f/u US carotid, no answer, left message for pt to return call. AW

## 2017-05-28 DIAGNOSIS — H903 Sensorineural hearing loss, bilateral: Secondary | ICD-10-CM | POA: Diagnosis not present

## 2017-05-29 ENCOUNTER — Ambulatory Visit (HOSPITAL_COMMUNITY)
Admission: RE | Admit: 2017-05-29 | Discharge: 2017-05-29 | Disposition: A | Discharge status: Home / Self Care | Payer: PPO | Source: Ambulatory Visit | Attending: Interventional Radiology | Admitting: Interventional Radiology

## 2017-05-29 DIAGNOSIS — I771 Stricture of artery: Secondary | ICD-10-CM | POA: Insufficient documentation

## 2017-05-29 DIAGNOSIS — Z95820 Peripheral vascular angioplasty status with implants and grafts: Secondary | ICD-10-CM | POA: Diagnosis not present

## 2017-05-29 DIAGNOSIS — I6523 Occlusion and stenosis of bilateral carotid arteries: Secondary | ICD-10-CM | POA: Diagnosis not present

## 2017-06-07 DIAGNOSIS — H903 Sensorineural hearing loss, bilateral: Secondary | ICD-10-CM | POA: Diagnosis not present

## 2017-07-04 ENCOUNTER — Telehealth (HOSPITAL_COMMUNITY): Payer: Self-pay

## 2017-07-04 NOTE — Telephone Encounter (Signed)
Left message for pt to f/u in 6 months and call if she has any questions. AW

## 2017-08-07 ENCOUNTER — Other Ambulatory Visit: Payer: Self-pay | Admitting: Cardiology

## 2017-08-07 DIAGNOSIS — Z1283 Encounter for screening for malignant neoplasm of skin: Secondary | ICD-10-CM | POA: Diagnosis not present

## 2017-08-07 DIAGNOSIS — Z85820 Personal history of malignant melanoma of skin: Secondary | ICD-10-CM | POA: Diagnosis not present

## 2017-08-07 DIAGNOSIS — Z08 Encounter for follow-up examination after completed treatment for malignant neoplasm: Secondary | ICD-10-CM | POA: Diagnosis not present

## 2017-08-07 DIAGNOSIS — D225 Melanocytic nevi of trunk: Secondary | ICD-10-CM | POA: Diagnosis not present

## 2017-09-17 ENCOUNTER — Ambulatory Visit
Admission: RE | Admit: 2017-09-17 | Discharge: 2017-09-17 | Disposition: A | Discharge status: Home / Self Care | Payer: PPO | Source: Ambulatory Visit | Attending: Family Medicine | Admitting: Family Medicine

## 2017-09-17 ENCOUNTER — Other Ambulatory Visit: Payer: Self-pay | Admitting: Family Medicine

## 2017-09-17 DIAGNOSIS — Z1231 Encounter for screening mammogram for malignant neoplasm of breast: Secondary | ICD-10-CM

## 2017-09-19 DIAGNOSIS — H2511 Age-related nuclear cataract, right eye: Secondary | ICD-10-CM | POA: Diagnosis not present

## 2017-09-19 DIAGNOSIS — H52223 Regular astigmatism, bilateral: Secondary | ICD-10-CM | POA: Diagnosis not present

## 2017-09-19 DIAGNOSIS — H5201 Hypermetropia, right eye: Secondary | ICD-10-CM | POA: Diagnosis not present

## 2017-09-19 DIAGNOSIS — H2512 Age-related nuclear cataract, left eye: Secondary | ICD-10-CM | POA: Diagnosis not present

## 2017-09-19 DIAGNOSIS — H35361 Drusen (degenerative) of macula, right eye: Secondary | ICD-10-CM | POA: Diagnosis not present

## 2017-09-20 ENCOUNTER — Ambulatory Visit: Payer: PPO | Admitting: Family Medicine

## 2017-09-21 ENCOUNTER — Telehealth: Payer: Self-pay | Admitting: Internal Medicine

## 2017-09-21 NOTE — Telephone Encounter (Addendum)
Patient called on 09/18/17 requesting to establish care with Dr Quay Burow. Her son is a current patient of hers. Request sent to Dr Quay Burow and she has been approved to be scheduled. Called patient (09/21/17) and left message for her to call back to schedule. Please schedule in a 30 minute OFFICE VISIT slot as OFFICE VISIT and notify practice so that visit type can be changed.

## 2017-10-08 NOTE — Progress Notes (Signed)
Subjective:    Patient ID: Susan Gillespie, female    DOB: [birth year], 72 y.o.   MRN: [id number]  HPI  She is here to establish with a new pcp. The patient is here for follow up.  Migraines: takes imitrex as needed.  Stopped migraines at 60  but stilll has aura.  Rarely takes imitrex.  Try tylenol ES or celebrex if she thinks it is arthritis from the neck.   Neck pain: She is arthritis in her neck and has flares at times.  Takes celebrex as needed, which is rare and it works well.  Frequent BM - IBS - goes about 10 times day.  usuasally formed. Taking imodium daily and holds if no BM for 2 dasy.    HRT - uses a 1/2 patch once a week.  If she does not use it she gets bladder spasms.  They can radiate to her knees and then she a hot flashe.  It sifnificantly affects her quality of life.    Svt controlled with ditiazem.  She denies palpitations.    Anxiety - takes xanax rarely for situational anxiety only.  She denies generalized anxiety or depression.    CHF:  Has SOB  - takes an extra lasix as needed, which helps.    Very active. No regimented exercise.     Medications and allergies reviewed with patient and updated if appropriate.  Patient Active Problem List   Diagnosis Date Noted  . Chronic diastolic CHF (congestive heart failure), NYHA class 1 ([place]) 02/20/2017  . CAD in native artery 11/10/2016  . Carotid stenosis, bilateral 08/31/2016  . SOB (shortness of breath) 08/31/2016  . 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[place]) 01/05/2016  . Chest discomfort 05/21/2015  . Headache disorder 09/21/2014  . Pernicious anemia 09/15/2013  . Routine general medical examination at a health care facility 09/15/2013  . B12 deficiency anemia 09/15/2013  . GAD (generalized anxiety disorder) 12/20/2012  . Spinal stenosis of lumbar region 07/05/2012  . CVA (cerebral infarction) 06/28/2012  . Bleeding tendency ([place]) 05/16/2012  . Paroxysmal supraventricular tachycardia ([place]) 09/25/2011  . Benign  hypertensive heart disease without heart failure 12/02/2010  . Essential hypertension, benign 08/22/2010  . Prediabetes 04/28/2009  . Hyperlipidemia LDL goal <70 12/16/2008  . GERD 12/16/2008  . OSTEOARTHRITIS 12/16/2008    Current Outpatient Medications on File Prior to Visit  Medication Sig Dispense Refill  . acetaminophen (TYLENOL) 650 MG CR tablet Take 650 mg by mouth daily as needed for pain.     Marland Kitchen ALPRAZolam (XANAX) 0.5 MG tablet Take 0.25 mg by mouth at bedtime as needed for anxiety or sleep.     Marland Kitchen aspirin 325 MG tablet Take 1 tablet (325 mg total) by mouth daily. 30 tablet 0  . celecoxib (CELEBREX) 200 MG capsule Take 200 mg by mouth daily as needed for mild pain.    . cetirizine (ZYRTEC) 10 MG tablet Take 10 mg by mouth every morning.     . clopidogrel (PLAVIX) 75 MG tablet Take 1 tablet (75 mg total) by mouth daily. 30 tablet 2  . cycloSPORINE (RESTASIS) 0.05 % ophthalmic emulsion 1 drop 2 (two) times daily.    Marland Kitchen diltiazem (CARDIZEM CD) 180 MG 24 hr capsule Take 1 capsule (180 mg total) by mouth daily. 90 capsule 1  . diltiazem (DILACOR XR) 180 MG 24 hr capsule Take 180 mg by mouth at bedtime.    Marland Kitchen estradiol (VIVELLE-DOT) 0.05 MG/24HR patch Place 0.25 patches onto the skin once  a week.   3  . famotidine (PEPCID) 40 MG tablet Take 40 mg by mouth at bedtime.     . furosemide (LASIX) 20 MG tablet TAKE 1 TABLET BY MOUTH EVERY DAY 90 tablet 2  . IRON PO Take 65 mg by mouth daily.    Marland Kitchen loperamide (IMODIUM A-D) 2 MG tablet Take 2 mg by mouth daily.    Marland Kitchen losartan (COZAAR) 50 MG tablet Take 50 mg by mouth daily.  3  . Magnesium 250 MG TABS Take 250 mg by mouth daily.    . metoprolol tartrate (LOPRESSOR) 50 MG tablet Take 50 mg by mouth daily as needed (SVT symptoms).    . Omega-3 Fatty Acids (FISH OIL) 1200 MG CAPS Take by mouth.    . potassium chloride (K-DUR) 10 MEQ tablet Take 10 mEq by mouth daily.    . rosuvastatin (CRESTOR) 20 MG tablet Take 20 mg by mouth at bedtime.     .  SUMAtriptan (IMITREX) 50 MG tablet Take 50 mg by mouth every 2 (two) hours as needed for migraine. May repeat in 2 hours if headache persists or recurs.    . traZODone (DESYREL) 100 MG tablet Take 50 mg by mouth at bedtime.     Marland Kitchen UNABLE TO FIND 10 mg. Eye Vit C with Leutin    . nitroGLYCERIN (NITROSTAT) 0.4 MG SL tablet Place 1 tablet (0.4 mg total) under the tongue every 5 (five) minutes as needed for chest pain. 25 tablet 3   No current facility-administered medications on file prior to visit.     Past Medical History:  Diagnosis Date  . Anxiety   . Arthritis   . Bleeding tendency ([place]) 05/16/2012   Hx post op bleeding (epidural hematoma s/p ACDF '10; LLQ hematoma due to superficial vessel fascial bleed post colon resection; Normal platelet count; saw Dr. [name] '14  . CAD in native artery    a. mod to mod-severe CAD by cath 10/2016, med rx.  . Carotid stenosis, bilateral 08/31/2016   1-39% right and 40-59% left ICA stenosis by dopplers 05/2016  . Chronic diastolic CHF (congestive heart failure) ([place])   . Colon polyp   . Diverticulitis   . Family history of cardiovascular disease   . Fracture of right fibula   . GERD (gastroesophageal reflux disease)   . Headache(784.0)    mirgraines - history of  . HOH (hard of hearing)    wears hearing aids  . Hyperlipidemia   . Hypertension   . IBS (irritable bowel syndrome)    followed by Dr. [name]  . Liver cyst   . Melanoma ([place])    removed from back 04/2013  . Shortness of breath dyspnea    with exertion  . Stroke ([place]) 06/2012   mild  . SVT (supraventricular tachycardia) ([place])   . Urinary urgency   . UTI (lower urinary tract infection)    x 2 since June 2016    Past Surgical History:  Procedure Laterality Date  . ABDOMINAL HYSTERECTOMY    . ANGIOPLASTY     stent in subclavian LEFT 2016, angioplasty of stent 2017  . APPENDECTOMY    . AUGMENTATION MAMMAPLASTY    . BREAST EXCISIONAL BIOPSY Left    benign  . BREAST SURGERY      BIL breast augmentation  . CHOLECYSTECTOMY    . COLON SURGERY     Sigmoid Colectomy, returned for post op bleeding  . COLONOSCOPY W/ POLYPECTOMY    . COLONOSCOPY WITH  PROPOFOL N/A 05/05/2016   Procedure: COLONOSCOPY WITH PROPOFOL;  Surgeon: Alphonsa Overall, MD;  Location: [facility];  Service: General;  Laterality: N/A;  . ESOPHAGOGASTRODUODENOSCOPY (EGD) WITH PROPOFOL N/A 05/05/2016   Procedure: ESOPHAGOGASTRODUODENOSCOPY (EGD) WITH PROPOFOL;  Surgeon: Alphonsa Overall, MD;  Location: [facility];  Service: General;  Laterality: N/A;  . EYE SURGERY     eyelid drooping fixed  . IR GENERIC HISTORICAL  12/24/2015   IR ANGIO INTRA EXTRACRAN SEL COM CAROTID INNOMINATE BILAT MOD SED 12/24/2015 Luanne Bras, MD [facility]  . IR GENERIC HISTORICAL  12/24/2015   IR ANGIOGRAM EXTREMITY LEFT 12/24/2015 Luanne Bras, MD [facility]  . IR GENERIC HISTORICAL  12/24/2015   IR ANGIO VERTEBRAL SEL VERTEBRAL UNI R MOD SED 12/24/2015 Luanne Bras, MD [facility]  . IR GENERIC HISTORICAL  01/05/2016   IR PTA NON CORO-LOWER EXTREM 01/05/2016 Luanne Bras, MD [facility]  . IR GENERIC HISTORICAL  03/07/2016   IR RADIOLOGIST EVAL & MGMT 03/07/2016 [facility]  . JOINT REPLACEMENT Left    thumb  . LEFT HEART CATH AND CORONARY ANGIOGRAPHY N/A 11/10/2016   Procedure: Left Heart Cath and Coronary Angiography;  Surgeon: Belva Crome, MD;  Location: [facility];  Service: Cardiovascular;  Laterality: N/A;  . MASTOID DEBRIDEMENT    . MASTOIDECTOMY REVISION    . RADIOLOGY WITH ANESTHESIA N/A 11/09/2014   Procedure: STENT PLACEMENT;  Surgeon: Luanne Bras, MD;  Location: [facility];  Service: Radiology;  Laterality: N/A;  . RADIOLOGY WITH ANESTHESIA N/A 05/31/2015   Procedure: RADIOLOGY WITH ANESTHESIA;  Surgeon: Luanne Bras, MD;  Location: [facility];  Service: Radiology;  Laterality: N/A;  . RADIOLOGY WITH ANESTHESIA N/A 01/05/2016   Procedure: RADIOLOGY WITH ANESTHESIA ANGIOPLASTY WITH  STENTNG;  Surgeon: Luanne Bras, MD;  Location: [facility];  Service: Radiology;  Laterality: N/A;  . skin cancer excised  04/2013   Melonoma  . SPINE SURGERY     cervical fusion, returned to OR for post op  bleeding  . TONSILLECTOMY    . TONSILLECTOMY    . TUBAL LIGATION    . VAGINA SURGERY     anterior posterior repair    Social History   Socioeconomic History  . Marital status: Divorced    Spouse name: Not on file  . Number of children: 2  . Years of education: 102  . Highest education level: Not on file  Occupational History  . 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 Financial resource strain: Not on file  . Food insecurity:    Worry: Not on file    Inability: Not on file  . Transportation needs:    Medical: Not on file    Non-medical: Not on file  Tobacco Use  . Smoking status: Former Smoker    Packs/day: 2.00    Years: 22.00    Pack years: 44.00    Types: Cigarettes    Last attempt to quit: 04/23/1985    Years since quitting: 32.4  . Smokeless tobacco: Never Used  Substance and Sexual Activity  . Alcohol use: Yes    Alcohol/week: 2.4 oz    Types: 4 Glasses of wine per week    Comment: 1 drink a  day  . Drug use: No  . Sexual activity: Yes    Birth control/protection: Surgical  Lifestyle  . Physical activity:    Days per week: Not on  file    Minutes per session: Not on file  . Stress: Not on file  Relationships  . Social connections:    Talks on phone: Not on file    Gets together: Not on file    Attends religious service: Not on file    Active member of club or organization: Not on file    Attends meetings of clubs or organizations: Not on file    Relationship status: Not on file  Other Topics Concern  . Not on file  Social History Narrative   ** Merged History Encounter **       Domestic partner   Regular exercise-no   Right handed   Caffeine use-- 2 cups coffee daily,  rare soda    Family History  Problem Relation Age of Onset  . Hyperlipidemia Other   . Hypertension Other   . Cancer Other        lung, esophagus, stomach  . Stroke Other   . Heart disease Father   . Heart disease Sister   . Heart attack Sister   . Heart disease Brother   . Heart attack Brother   . Stroke Son     Review of Systems  Constitutional: Negative for chills and fever.  Respiratory: Positive for shortness of breath. Negative for cough and wheezing.   Cardiovascular: Negative for chest pain, palpitations and leg swelling.  Gastrointestinal: Negative for abdominal pain, blood in stool, constipation and diarrhea.       GERD 2/week  Musculoskeletal: Positive for arthralgias and neck pain (intermittent).  Neurological: Positive for weakness (right hand from hematoma after surgery) and headaches (tension). Negative for dizziness and light-headedness.  Hematological: Bruises/bleeds easily.  Psychiatric/Behavioral: Negative for dysphoric mood. The patient is nervous/anxious (situational only).        Objective:   Vitals:   10/09/17 1508  BP: 138/62  Pulse: 78  Resp: 16  Temp: 98.1 F (36.7 C)  SpO2: 98%   BP Readings from Last 3 Encounters:  10/09/17 138/62  02/21/17 137/62  02/20/17 (!) 136/50   Wt Readings from Last 3 Encounters:  10/09/17 160 lb (72.6 kg)  02/21/17 161 lb (73 kg)  02/20/17 159 lb 9.6 oz (72.4 kg)   Body mass index is 28.34 kg/m.   Physical Exam    Constitutional: Appears well-developed and well-nourished. No distress.  HENT:  Head: Normocephalic and atraumatic.  Neck: Neck supple. No tracheal deviation present. No thyromegaly present.  No cervical lymphadenopathy Cardiovascular: Normal rate, regular rhythm and normal heart sounds.   No murmur heard. No carotid bruit .  No edema Pulmonary/Chest: Effort normal and breath sounds normal. No respiratory distress. No has no wheezes. No rales.  Abdomen: soft, NT, ND, no HSM Skin: Skin is  warm and dry. Not diaphoretic.  Psychiatric: Normal mood and affect. Behavior is normal.      Assessment & Plan:    See Problem List for Assessment and Plan of chronic medical problems.

## 2017-10-09 ENCOUNTER — Encounter: Payer: Self-pay | Admitting: Internal Medicine

## 2017-10-09 ENCOUNTER — Other Ambulatory Visit (INDEPENDENT_AMBULATORY_CARE_PROVIDER_SITE_OTHER): Payer: PPO

## 2017-10-09 ENCOUNTER — Ambulatory Visit (INDEPENDENT_AMBULATORY_CARE_PROVIDER_SITE_OTHER): Payer: PPO | Admitting: Internal Medicine

## 2017-10-09 VITALS — BP 138/62 | HR 78 | Temp 98.1°F | Resp 16 | Ht 63.0 in | Wt 160.0 lb

## 2017-10-09 DIAGNOSIS — I10 Essential (primary) hypertension: Secondary | ICD-10-CM

## 2017-10-09 DIAGNOSIS — R7303 Prediabetes: Secondary | ICD-10-CM

## 2017-10-09 DIAGNOSIS — N951 Menopausal and female climacteric states: Secondary | ICD-10-CM | POA: Diagnosis not present

## 2017-10-09 DIAGNOSIS — I471 Supraventricular tachycardia, unspecified: Secondary | ICD-10-CM

## 2017-10-09 DIAGNOSIS — I5032 Chronic diastolic (congestive) heart failure: Secondary | ICD-10-CM

## 2017-10-09 DIAGNOSIS — E785 Hyperlipidemia, unspecified: Secondary | ICD-10-CM

## 2017-10-09 DIAGNOSIS — G43909 Migraine, unspecified, not intractable, without status migrainosus: Secondary | ICD-10-CM | POA: Insufficient documentation

## 2017-10-09 DIAGNOSIS — M47812 Spondylosis without myelopathy or radiculopathy, cervical region: Secondary | ICD-10-CM | POA: Insufficient documentation

## 2017-10-09 DIAGNOSIS — I639 Cerebral infarction, unspecified: Secondary | ICD-10-CM

## 2017-10-09 DIAGNOSIS — N3289 Other specified disorders of bladder: Secondary | ICD-10-CM | POA: Insufficient documentation

## 2017-10-09 DIAGNOSIS — G4700 Insomnia, unspecified: Secondary | ICD-10-CM

## 2017-10-09 DIAGNOSIS — I6523 Occlusion and stenosis of bilateral carotid arteries: Secondary | ICD-10-CM | POA: Diagnosis not present

## 2017-10-09 DIAGNOSIS — F418 Other specified anxiety disorders: Secondary | ICD-10-CM

## 2017-10-09 DIAGNOSIS — I771 Stricture of artery: Secondary | ICD-10-CM

## 2017-10-09 DIAGNOSIS — G43109 Migraine with aura, not intractable, without status migrainosus: Secondary | ICD-10-CM | POA: Diagnosis not present

## 2017-10-09 DIAGNOSIS — K219 Gastro-esophageal reflux disease without esophagitis: Secondary | ICD-10-CM

## 2017-10-09 DIAGNOSIS — K588 Other irritable bowel syndrome: Secondary | ICD-10-CM

## 2017-10-09 DIAGNOSIS — K589 Irritable bowel syndrome without diarrhea: Secondary | ICD-10-CM | POA: Insufficient documentation

## 2017-10-09 LAB — COMPREHENSIVE METABOLIC PANEL
ALBUMIN: 4.3 g/dL (ref 3.5–5.2)
ALT: 14 U/L (ref 0–35)
AST: 17 U/L (ref 0–37)
Alkaline Phosphatase: 48 U/L (ref 39–117)
BUN: 15 mg/dL (ref 6–23)
CALCIUM: 9.6 mg/dL (ref 8.4–10.5)
CHLORIDE: 101 meq/L (ref 96–112)
CO2: 29 meq/L (ref 19–32)
CREATININE: 1.12 mg/dL (ref 0.40–1.20)
GFR: 50.61 mL/min — AB (ref >60.00)
Glucose, Bld: 108 mg/dL — ABNORMAL HIGH (ref 70–99)
POTASSIUM: 4.5 meq/L (ref 3.5–5.1)
Sodium: 137 mEq/L (ref 135–145)
Total Bilirubin: 0.4 mg/dL (ref 0.2–1.2)
Total Protein: 6.7 g/dL (ref 6.0–8.3)

## 2017-10-09 LAB — LIPID PANEL
CHOL/HDL RATIO: 2
Cholesterol: 161 mg/dL (ref 0–200)
HDL: 72.7 mg/dL (ref >39.00)
LDL Cholesterol: 51 mg/dL (ref 0–99)
NONHDL: 88.39
TRIGLYCERIDES: 189 mg/dL — AB (ref 0.0–149.0)
VLDL: 37.8 mg/dL (ref 0.0–40.0)

## 2017-10-09 LAB — CBC WITH DIFFERENTIAL/PLATELET
BASOS ABS: 0 10*3/uL (ref 0.0–0.1)
Basophils Relative: 0.6 % (ref 0.0–3.0)
Eosinophils Absolute: 0.1 10*3/uL (ref 0.0–0.7)
Eosinophils Relative: 2.1 % (ref 0.0–5.0)
HCT: 34.8 % — ABNORMAL LOW (ref 36.0–46.0)
Hemoglobin: 11.9 g/dL — ABNORMAL LOW (ref 12.0–15.0)
LYMPHS ABS: 1 10*3/uL (ref 0.7–4.0)
Lymphocytes Relative: 18.1 % (ref 12.0–46.0)
MCHC: 34.3 g/dL (ref 30.0–36.0)
MCV: 99.1 fl (ref 78.0–100.0)
MONOS PCT: 8.4 % (ref 3.0–12.0)
Monocytes Absolute: 0.5 10*3/uL (ref 0.1–1.0)
NEUTROS PCT: 70.8 % (ref 43.0–77.0)
Neutro Abs: 3.8 10*3/uL (ref 1.4–7.7)
Platelets: 262 10*3/uL (ref 150.0–400.0)
RBC: 3.51 Mil/uL — AB (ref 3.87–5.11)
RDW: 12.9 % (ref 11.5–15.5)
WBC: 5.4 10*3/uL (ref 4.0–10.5)

## 2017-10-09 LAB — HEMOGLOBIN A1C: Hgb A1c MFr Bld: 6.2 % (ref 4.6–6.5)

## 2017-10-09 LAB — TSH: TSH: 1.11 u[IU]/mL (ref 0.35–4.50)

## 2017-10-09 NOTE — Assessment & Plan Note (Signed)
On estradiol patch-1 patch per week Efforts to go off in the past has caused hot flashes and bladder spasms that significantly affects her quality of life Feels the benefits outweighs the risks We will continue

## 2017-10-09 NOTE — Assessment & Plan Note (Signed)
Following with cardiology Has shortness of breath and 1-2 times a week feels that she needs an extra dose of Lasix because she feels fluid overloaded

## 2017-10-09 NOTE — Assessment & Plan Note (Signed)
Very rarely takes alprazolam We will continue

## 2017-10-09 NOTE — Assessment & Plan Note (Signed)
Controlled with trazodone nightly We will continue

## 2017-10-09 NOTE — Assessment & Plan Note (Signed)
Takes pepcid daily Has GERD about twice a week

## 2017-10-09 NOTE — Assessment & Plan Note (Signed)
Symptoms controlled with Imodium daily Continue

## 2017-10-09 NOTE — Assessment & Plan Note (Signed)
Following with Dr. Leonie Man Taking Plavix and full aspirin daily Taking statin Blood pressure controlled

## 2017-10-09 NOTE — Assessment & Plan Note (Signed)
Controlled with diltiazem daily Following with cardiology

## 2017-10-09 NOTE — Assessment & Plan Note (Signed)
Check lipid panel  Continue daily statin Regular exercise and healthy diet encouraged  

## 2017-10-09 NOTE — Patient Instructions (Addendum)
  Test(s) ordered today. Your results will be released to MyChart (or called to you) after review, usually within 72hours after test completion. If any changes need to be made, you will be notified at that same time.  Medications reviewed and updated.  No changes recommended at this time.    Please followup in 6 months   

## 2017-10-09 NOTE — Assessment & Plan Note (Signed)
BP well controlled Current regimen effective and well tolerated Continue current medications at current doses cmp  

## 2017-10-09 NOTE — Assessment & Plan Note (Signed)
Check a1c Low sugar / carb diet Stressed regular exercise, weight loss  

## 2017-10-09 NOTE — Assessment & Plan Note (Signed)
For the most part migraines resolved at age 72, but still has occasional aura Very rarely takes Imitrex Some headaches secondary to arthritis and neck for which she takes Celebrex or Tylenol

## 2017-10-09 NOTE — Assessment & Plan Note (Signed)
Following with Dr. Estanislado Pandy

## 2017-10-09 NOTE — Assessment & Plan Note (Signed)
Monitored by cardiology and interventional radiology

## 2017-10-11 ENCOUNTER — Encounter: Payer: Self-pay | Admitting: Internal Medicine

## 2017-10-19 DIAGNOSIS — H10023 Other mucopurulent conjunctivitis, bilateral: Secondary | ICD-10-CM | POA: Diagnosis not present

## 2017-10-25 DIAGNOSIS — H16293 Other keratoconjunctivitis, bilateral: Secondary | ICD-10-CM | POA: Diagnosis not present

## 2017-10-25 DIAGNOSIS — H02002 Unspecified entropion of right lower eyelid: Secondary | ICD-10-CM | POA: Diagnosis not present

## 2017-10-25 DIAGNOSIS — H1132 Conjunctival hemorrhage, left eye: Secondary | ICD-10-CM | POA: Diagnosis not present

## 2017-10-25 NOTE — Telephone Encounter (Signed)
Signing past encounter of attempted phone call 

## 2017-11-12 ENCOUNTER — Other Ambulatory Visit: Payer: Self-pay | Admitting: Internal Medicine

## 2017-11-12 NOTE — Telephone Encounter (Signed)
Famotidine refill last on 01/04/16 (expired) Plavix last refill on 06/01/15 (expired) Losartan last refill on 11/30/16 (expired) K-Dur last refill on 11/07/16 (expired) Crestor last refill on 11/07/16 (expired) Trazodone last refill 10/09/17 historical provider Last OV:10/09/17 NGI:[id number] Pharmacy: [id number] - [place], [place] [phone] (Phone) [phone] (Fax)

## 2017-11-12 NOTE — Telephone Encounter (Signed)
Copied from [place] [id number]. Topic: Quick Communication - Rx Refill/Question >> Nov 12, 2017 10:10 AM Alfredia Ferguson R wrote: Medication: famotidine (PEPCID) 40 MG tablet , rosuvastatin (CRESTOR) 20 MG tablet , clopidogrel (PLAVIX) 75 MG tablet , traZODone (DESYREL) 50 MG tablet , potassium chloride (K-DUR) 10 MEQ tablet , losartan (COZAAR) 50 MG tablet  Has the patient contacted their pharmacy? No, Previous PCPs name is still on it  Preferred Pharmacy (with phone number or street name): [id number] [place], [place] - [address] [phone] (Phone) [phone] (Fax)

## 2017-11-13 MED ORDER — LOSARTAN POTASSIUM 50 MG PO TABS: 50.0000 mg | ORAL_TABLET | Freq: Every day | ORAL | 1 refills | Status: DC

## 2017-11-13 MED ORDER — CLOPIDOGREL BISULFATE 75 MG PO TABS: 75.0000 mg | ORAL_TABLET | Freq: Every day | ORAL | 1 refills | Status: DC

## 2017-11-13 MED ORDER — TRAZODONE HCL 50 MG PO TABS: ORAL_TABLET | ORAL | 1 refills | Status: DC

## 2017-11-13 MED ORDER — FAMOTIDINE 40 MG PO TABS: 40.0000 mg | ORAL_TABLET | Freq: Every day | ORAL | 1 refills | Status: DC

## 2017-11-13 MED ORDER — POTASSIUM CHLORIDE ER 10 MEQ PO TBCR: 10.0000 meq | EXTENDED_RELEASE_TABLET | Freq: Every day | ORAL | 1 refills | Status: DC

## 2017-11-13 MED ORDER — ROSUVASTATIN CALCIUM 20 MG PO TABS: 20.0000 mg | ORAL_TABLET | Freq: Every day | ORAL | 1 refills | Status: DC

## 2017-11-13 NOTE — Telephone Encounter (Signed)
Reviewed chart pt is up-to-date sent refills to [facility].../lmb  

## 2017-11-23 ENCOUNTER — Other Ambulatory Visit (HOSPITAL_COMMUNITY): Payer: Self-pay | Admitting: Interventional Radiology

## 2017-11-23 DIAGNOSIS — I771 Stricture of artery: Secondary | ICD-10-CM

## 2017-11-23 DIAGNOSIS — H2511 Age-related nuclear cataract, right eye: Secondary | ICD-10-CM | POA: Diagnosis not present

## 2017-11-23 DIAGNOSIS — H40013 Open angle with borderline findings, low risk, bilateral: Secondary | ICD-10-CM | POA: Diagnosis not present

## 2017-11-23 DIAGNOSIS — H353131 Nonexudative age-related macular degeneration, bilateral, early dry stage: Secondary | ICD-10-CM | POA: Diagnosis not present

## 2017-11-23 DIAGNOSIS — H2513 Age-related nuclear cataract, bilateral: Secondary | ICD-10-CM | POA: Diagnosis not present

## 2017-11-30 ENCOUNTER — Ambulatory Visit (HOSPITAL_COMMUNITY)
Admission: RE | Admit: 2017-11-30 | Discharge: 2017-11-30 | Disposition: A | Discharge status: Home / Self Care | Payer: PPO | Source: Ambulatory Visit | Attending: Interventional Radiology | Admitting: Interventional Radiology

## 2017-11-30 DIAGNOSIS — I771 Stricture of artery: Secondary | ICD-10-CM | POA: Insufficient documentation

## 2017-11-30 NOTE — Progress Notes (Signed)
*  Preliminary Results* Carotid artery duplex has been completed. Bilateral internal carotid arteries are 1-39%. Left ICA velocities may underestimate degree of stenosis due to more proximal obstruction.  Left ECA >50% stenosis.  Vertebral arteries are patent with antegrade flow.  11/30/2017 2:24 PM  Susan Gillespie Dawna Part

## 2017-12-04 ENCOUNTER — Telehealth (HOSPITAL_COMMUNITY): Payer: Self-pay

## 2017-12-04 NOTE — Telephone Encounter (Signed)
Left message for pt to f/u in 6 months. AW  

## 2017-12-21 ENCOUNTER — Telehealth: Payer: Self-pay

## 2017-12-21 NOTE — Telephone Encounter (Signed)
   [facility] Pre-operative Risk Assessment    Request for surgical clearance:  1. What type of surgery is being performed? Cataract Extraction with Intraocular Lens implantation of the R/L eye   2. When is this surgery scheduled?  01/21/18   3. What type of clearance is required (medical clearance vs. Pharmacy clearance to hold med vs. Both)?  medical  4. Are there any medications that need to be held prior to surgery and how long?   5. Practice name and name of physician performing surgery? [facility]/ Dr Lucita Ferrara  6. What is your office phone number [phone]    7.   What is your office fax number [phone]  8.   Anesthesia type (None, local, MAC, general) ?    Susan Gillespie  Susan Gillespie 12/21/2017, 11:49 AM  _________________________________________________________________   (provider comments below)

## 2017-12-21 NOTE — Telephone Encounter (Signed)
   Primary Cardiologist: Fransico Him, MD  Chart reviewed as part of pre-operative protocol coverage. Based on ACC/AHA guidelines, Susan Gillespie not required medical clearance for low risk cataract Extraction with Intraocular Lens implantation. She is cleared at acceptable risk.   I will route this recommendation to the requesting party via Epic fax function and remove from pre-op pool.  Please call with questions.  [place], [place] 12/21/2017, 1:29 PM

## 2018-01-21 DIAGNOSIS — H2511 Age-related nuclear cataract, right eye: Secondary | ICD-10-CM | POA: Diagnosis not present

## 2018-01-22 DIAGNOSIS — H2512 Age-related nuclear cataract, left eye: Secondary | ICD-10-CM | POA: Diagnosis not present

## 2018-02-01 ENCOUNTER — Other Ambulatory Visit: Payer: Self-pay | Admitting: Physician Assistant

## 2018-02-04 ENCOUNTER — Other Ambulatory Visit: Payer: Self-pay | Admitting: Cardiology

## 2018-02-04 ENCOUNTER — Other Ambulatory Visit: Payer: Self-pay | Admitting: Physician Assistant

## 2018-02-06 DIAGNOSIS — L821 Other seborrheic keratosis: Secondary | ICD-10-CM | POA: Diagnosis not present

## 2018-02-06 DIAGNOSIS — Z08 Encounter for follow-up examination after completed treatment for malignant neoplasm: Secondary | ICD-10-CM | POA: Diagnosis not present

## 2018-02-06 DIAGNOSIS — Z85820 Personal history of malignant melanoma of skin: Secondary | ICD-10-CM | POA: Diagnosis not present

## 2018-02-06 DIAGNOSIS — Z1283 Encounter for screening for malignant neoplasm of skin: Secondary | ICD-10-CM | POA: Diagnosis not present

## 2018-02-08 ENCOUNTER — Ambulatory Visit (INDEPENDENT_AMBULATORY_CARE_PROVIDER_SITE_OTHER): Payer: PPO | Admitting: *Deleted

## 2018-02-08 ENCOUNTER — Other Ambulatory Visit: Payer: Self-pay | Admitting: Physician Assistant

## 2018-02-08 DIAGNOSIS — Z23 Encounter for immunization: Secondary | ICD-10-CM | POA: Diagnosis not present

## 2018-02-08 NOTE — Telephone Encounter (Signed)
Outpatient Medication Detail    Disp Refills Start End   furosemide (LASIX) 20 MG tablet 90 tablet 0 02/04/2018    Sig - Route: Take 1 tablet (20 mg total) by mouth daily. Please keep upcoming appt in November with Dr. [name] for future refills. Thank you - Oral   Sent to pharmacy as: furosemide (LASIX) 20 MG tablet   E-Prescribing Status: Receipt confirmed by pharmacy (02/04/2018 4:06 PM EDT)   Pharmacy   [id number] - [place], [place] - [address]

## 2018-02-11 ENCOUNTER — Encounter: Payer: Self-pay | Admitting: Cardiology

## 2018-02-11 DIAGNOSIS — H2512 Age-related nuclear cataract, left eye: Secondary | ICD-10-CM | POA: Diagnosis not present

## 2018-02-12 ENCOUNTER — Telehealth: Payer: Self-pay | Admitting: Internal Medicine

## 2018-02-12 MED ORDER — LOSARTAN POTASSIUM 25 MG PO TABS: 50.0000 mg | ORAL_TABLET | Freq: Every day | ORAL | 5 refills | Status: DC

## 2018-02-12 NOTE — Telephone Encounter (Signed)
Ok to change to losartan 25 mg - take 2 daily.  New rx sent to [facility]

## 2018-02-12 NOTE — Telephone Encounter (Signed)
Pt aware.

## 2018-02-12 NOTE — Telephone Encounter (Signed)
Copied from [place] [id number]. Topic: Quick Communication - See Telephone Encounter >> Feb 12, 2018 10:17 AM Rutherford Nail, NT wrote: CRM for notification. See Telephone encounter for: 02/12/18. Patient calling and states pharmacy advised her to call office regarding her losartan (COZAAR) 50 MG tablet. States that the 50mg  is on back order and they are not sure when they will get it is. States that they have to 25mg  and the 100mg . Please advise. [id number] - [place], [place]

## 2018-02-18 ENCOUNTER — Other Ambulatory Visit: Payer: Self-pay | Admitting: Cardiology

## 2018-02-21 ENCOUNTER — Encounter: Payer: Self-pay | Admitting: Neurology

## 2018-02-21 ENCOUNTER — Ambulatory Visit (INDEPENDENT_AMBULATORY_CARE_PROVIDER_SITE_OTHER): Payer: PPO | Admitting: Neurology

## 2018-02-21 VITALS — BP 143/69 | HR 74 | Ht 63.0 in | Wt 160.6 lb

## 2018-02-21 DIAGNOSIS — H8112 Benign paroxysmal vertigo, left ear: Secondary | ICD-10-CM | POA: Diagnosis not present

## 2018-02-21 NOTE — Patient Instructions (Signed)
I had a long discussion with the patient with her symptoms of positional vertigo which likely represent benign paroxysmal positional vertigo.  I recommend she do vestibular stabilization exercises and will refer her to vestibular rehabilitation for the same.  She will continue participation in cognitively challenging activities like solving crossword puzzles, playing bridge or sodoku.  She will return for follow-up in the future only if necessary and no routine schedule appointment was made.   Benign Positional Vertigo Vertigo is the feeling that you or your surroundings are moving when they are not. Benign positional vertigo is the most common form of vertigo. The cause of this condition is not serious (is benign). This condition is triggered by certain movements and positions (is positional). This condition can be dangerous if it occurs while you are doing something that could endanger you or others, such as driving. What are the causes? In many cases, the cause of this condition is not known. It may be caused by a disturbance in an area of the inner ear that helps your brain to sense movement and balance. This disturbance can be caused by a viral infection (labyrinthitis), head injury, or repetitive motion. What increases the risk? This condition is more likely to develop in:  Women.  People who are 72 years of age or older.  What are the signs or symptoms? Symptoms of this condition usually happen when you move your head or your eyes in different directions. Symptoms may start suddenly, and they usually last for less than a minute. Symptoms may include:  Loss of balance and falling.  Feeling like you are spinning or moving.  Feeling like your surroundings are spinning or moving.  Nausea and vomiting.  Blurred vision.  Dizziness.  Involuntary eye movement (nystagmus).  Symptoms can be mild and cause only slight annoyance, or they can be severe and interfere with daily life. Episodes  of benign positional vertigo may return (recur) over time, and they may be triggered by certain movements. Symptoms may improve over time. How is this diagnosed? This condition is usually diagnosed by medical history and a physical exam of the head, neck, and ears. You may be referred to a health care provider who specializes in ear, nose, and throat (ENT) problems (otolaryngologist) or a provider who specializes in disorders of the nervous system (neurologist). You may have additional testing, including:  MRI.  A CT scan.  Eye movement tests. Your health care provider may ask you to change positions quickly while he or she watches you for symptoms of benign positional vertigo, such as nystagmus. Eye movement may be tested with an electronystagmogram (ENG), caloric stimulation, the Dix-Hallpike test, or the roll test.  An electroencephalogram (EEG). This records electrical activity in your brain.  Hearing tests.  How is this treated? Usually, your health care provider will treat this by moving your head in specific positions to adjust your inner ear back to normal. Surgery may be needed in severe cases, but this is rare. In some cases, benign positional vertigo may resolve on its own in 2-4 weeks. Follow these instructions at home: Safety  Move slowly.Avoid sudden body or head movements.  Avoid driving.  Avoid operating heavy machinery.  Avoid doing any tasks that would be dangerous to you or others if a vertigo episode would occur.  If you have trouble walking or keeping your balance, try using a cane for stability. If you feel dizzy or unstable, sit down right away.  Return to your normal activities as  told by your health care provider. Ask your health care provider what activities are safe for you. General instructions  Take over-the-counter and prescription medicines only as told by your health care provider.  Avoid certain positions or movements as told by your health care  provider.  Drink enough fluid to keep your urine clear or pale yellow.  Keep all follow-up visits as told by your health care provider. This is important. Contact a health care provider if:  You have a fever.  Your condition gets worse or you develop new symptoms.  Your family or friends notice any behavioral changes.  Your nausea or vomiting gets worse.  You have numbness or a "pins and needles" sensation. Get help right away if:  You have difficulty speaking or moving.  You are always dizzy.  You faint.  You develop severe headaches.  You have weakness in your legs or arms.  You have changes in your hearing or vision.  You develop a stiff neck.  You develop sensitivity to light. This information is not intended to replace advice given to you by your health care provider. Make sure you discuss any questions you have with your health care provider. Document Released: 01/09/2006 Document Revised: 09/09/2015 Document Reviewed: 07/27/2014 Elsevier Interactive Patient Education  [employer].

## 2018-02-21 NOTE — Progress Notes (Addendum)
[facility] [address] [place]. [place] [address] [phone]       OFFICE FOLLOW UP VISIT NOTE  Ms. Jodi Mourning Zirbes Date of Birth:  [birth year] Medical Record Number:  [id number]   Referring MD:  [name]  Reason for Referral:  Headache  HPI: Ms Stump is a pleasant 72 year old lady whose had intermittent recurrent stereotypical episodes of right-sided head abnormal pulsation or rumbling sensation on the right side of the head. This started 3 months ago and she's had multiple episodes in the last episode was 2 weeks ago. This may last for variable period of time from 5-6 minutes to up to 5-6 hours. There are no specific triggers and the episodes can occur at any time or with any activities. She finds annoying though she states it is not painful. She feels the sensation is similar to horses galloping. In only 2 of these episodes she has had headache later but not during the episode. She does have remote history of migraine headaches but feels that she outgrew them during her menopause. She's had however 2 episodes of migraines in the last few months but feels they were unrelated to these other episodes. She denies any competing symptoms in the, speech difficulty, vision problems, vertigo, diplopia, gait, balance problems or focal weakness or numbness. She feels she is not anxious during these episodes though she does take Xanax when necessary about twice a week for an unrelated anxiety. She has history of SVTs but feels she has taken her pulse during these episodes and it is not high. 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 Carotid ultrasound and transthoracic echo were unremarkable. Vascular risk factors identified included hypertension and hyperlipidemia. She had previously been on aspirin which was changed to Plavix but presently  she is on aspirin 81 mg due to easy bruising. She states her blood pressure is well controlled though it is slightly elevated at 148/66 in office today. She is on aspirin 81 mg which is tolerating well. She is also on Crestor and had lipid profile checked a few months ago and it was fine. She is not having any significant myalgias or arthralgias. Update 12/01/2014 : She returns for follow-up after initial consultation with me 2 months ago. She had MRI scan of the brain on 09/25/14 which I personally reviewed showed no acute abnormality. MRA of the brain showed no large vessel intracranial stenosis. 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 These changes were much advanced compared with previous MRA from June 2014. 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 After discussion of risks benefits patient agreed to angioplasty stenting and hence was referred to Dr. Estanislado Pandy who perform this on 11/09/14 uneventfully. Patient has been on aspirin and Plavix since then and is tolerating it well with only minor bruising. She states that hers symptoms of right hemicranial pulsations have since disappeared and she's had no episodes since her stenting. She saw Dr. Estanislado Pandy on 11/24/14 and had platelet reactivity testing done but the results are pending. She was advised to have repeat carotid and subclavian ultrasound in 4 months. Update 02/23/2016 : She returns for follow-up after last visit a year ago. She was doing well until September when she started  having   dizziness  when she went to have her hair washed and also felt left upper extremity was fatiguedand found her left radial pulse to be diminished. She was seen by Dr. 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 She underwent elective angioplasty 01/10/16 for restenosis did well   Initially but a week later she developed positional vertigo again which lasted about a month and has now gradually improved. She  is on aspirin and Plavix and having bruising but no major bleeding. She is also having some bladder spasms which occasionally awaken her from sleep. She states her blood pressure is good today it is 145/73. She does occasionally feel off balance but and leans to the right but has had no falls or injuries.  Update 02/21/2017 ; she returns for follow-up after last visit a year ago.  She is accompanied by her daughter.  Patient states she is doing well she did not had an recurrent stroke or TIA symptoms.  She has seen a cardiologist couple of times and been started on Lasix for congestive heart failure.  She is also been started on diltiazem for SVT.  She has not new complaint of memory loss which she has had for a year or so.  She feels it may be slowly getting worse.  At times she has trouble remembering names but may remember later.  She can get easily distracted.  She is still functionally fully independent and managing her own affairs.  She does bruise and bleed easily and remains on dual antiplatelet therapy.  She plans to see Dr. Estanislado Pandy and discuss if she can come off the aspirin. Update 02/21/2018 : She returns for follow-up after last visit exactly a year ago.  She continues to do well.  She does have mild short-term memory difficulties but she keeps herself quite active.  She reads a lot as well as salts crossword puzzles and does mentally challenging games on her iPhone.  She has a new complaint today of positional vertigo.  This happens mostly in bed when she rolls to the left side.  This is transient.  There is no nausea vomiting or other accompanying symptoms.  She does have prior history of similar vertigo a few years ago after her cervical spine surgery.  She is still on both aspirin and Plavix and has not discussed with Dr. Estanislado Pandy if she could stop 1 of them.  She does  get carotid ultrasound 6 monthly in his office. ROS:   14 system review of systems is positive for   memory loss easy bruising , dizziness, allergies and all other systems negative  PMH:  Past Medical History:  Diagnosis Date  . Anxiety   . Arthritis   . Bleeding tendency ([place]) 05/16/2012   Hx post op bleeding (epidural hematoma s/p ACDF '10; LLQ hematoma due to superficial vessel fascial bleed post colon resection; Normal platelet count; saw Dr. [name] '14  . CAD in native artery    a. mod to mod-severe CAD by cath 10/2016, med rx.  . Carotid stenosis, bilateral 08/31/2016   1-39% right and 40-59% left ICA stenosis by dopplers 05/2016  . Chronic diastolic CHF (congestive heart failure) ([place])   . Colon polyp   . Diverticulitis   . Family history of cardiovascular disease   . Fracture of right fibula   . GERD (gastroesophageal reflux disease)   . Headache(784.0)    mirgraines - history of  . HOH (  hard of hearing)    wears hearing aids  . Hyperlipidemia   . Hypertension   . IBS (irritable bowel syndrome)    followed by Dr. [name]  . Liver cyst   . Melanoma ([place])    removed from back 04/2013  . Shortness of breath dyspnea    with exertion  . Stroke ([place]) 06/2012   mild  . SVT (supraventricular tachycardia) ([place])   . Urinary urgency   . UTI (lower urinary tract infection)    x 2 since June 2016    Social History:  Social History   Socioeconomic History  . Marital status: Divorced    Spouse name: Not on file  . Number of children: 2  . Years of education: 12  . Highest education level: Not on file  Occupational History  . 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 Financial resource strain: Not on file  . Food insecurity:    Worry: Not on file    Inability: Not on file  . Transportation needs:    Medical: Not on file    Non-medical: Not on file  Tobacco Use  . Smoking status: Former  Smoker    Packs/day: 2.00    Years: 22.00    Pack years: 44.00    Types: Cigarettes    Last attempt to quit: 04/23/1985    Years since quitting: 32.8  . Smokeless tobacco: Never Used  Substance and Sexual Activity  . Alcohol use: Yes    Alcohol/week: 4.0 standard drinks    Types: 4 Glasses of wine per week    Comment: 1 drink a  day  . Drug use: No  . Sexual activity: Yes    Birth control/protection: Surgical  Lifestyle  . Physical activity:    Days per week: Not on file    Minutes per session: Not on file  . Stress: Not on file  Relationships  . Social connections:    Talks on phone: Not on file    Gets together: Not on file    Attends religious service: Not on file    Active member of club or organization: Not on file    Attends meetings of clubs or organizations: Not on file    Relationship status: Not on file  . Intimate partner violence:    Fear of current or ex partner: Not on file    Emotionally abused: Not on file    Physically abused: Not on file    Forced sexual activity: Not on file  Other Topics Concern  . Not on file  Social History Narrative   ** Merged History Encounter **       Domestic partner   Regular exercise-no   Right handed   Caffeine use-- 2 cups coffee daily, rare soda    Medications:   Current Outpatient Medications on File Prior to Visit  Medication Sig Dispense Refill  . acetaminophen (TYLENOL) 650 MG CR tablet Take 650 mg by mouth daily as needed for pain.     Marland Kitchen ALPRAZolam (XANAX) 0.5 MG tablet Take 0.25 mg by mouth at bedtime as needed for anxiety or sleep.     Marland Kitchen aspirin 325 MG tablet Take 1 tablet (325 mg total) by mouth daily. 30 tablet 0  . celecoxib (CELEBREX) 200 MG capsule Take 200 mg by mouth daily as needed for mild pain.    . cetirizine (ZYRTEC) 10  MG tablet Take 10 mg by mouth every morning.     . clopidogrel (PLAVIX) 75 MG tablet Take 1 tablet (75 mg total) by mouth daily. 90 tablet 1  . cycloSPORINE (RESTASIS) 0.05 %  ophthalmic emulsion 1 drop 2 (two) times daily.    Marland Kitchen diltiazem (CARDIZEM CD) 180 MG 24 hr capsule TAKE 1 CAPSULE BY MOUTH EVERY DAY 90 capsule 0  . estradiol (VIVELLE-DOT) 0.05 MG/24HR patch Place 0.25 patches onto the skin once a week.   3  . famotidine (PEPCID) 40 MG tablet Take 1 tablet (40 mg total) by mouth at bedtime. 90 tablet 1  . furosemide (LASIX) 20 MG tablet Take 1 tablet (20 mg total) by mouth daily. Please keep upcoming appt in November with Dr. [name] for future refills. Thank you 90 tablet 0  . IRON PO Take 65 mg by mouth daily.    Marland Kitchen loperamide (IMODIUM A-D) 2 MG tablet Take 2 mg by mouth daily.    Marland Kitchen losartan (COZAAR) 25 MG tablet Take 2 tablets (50 mg total) by mouth daily. 60 tablet 5  . Magnesium 250 MG TABS Take 250 mg by mouth daily.    . metoprolol tartrate (LOPRESSOR) 50 MG tablet Take 50 mg by mouth daily as needed (SVT symptoms).    . Omega-3 Fatty Acids (FISH OIL) 1200 MG CAPS Take by mouth.    . potassium chloride (K-DUR) 10 MEQ tablet Take 1 tablet (10 mEq total) by mouth daily. 90 tablet 1  . rosuvastatin (CRESTOR) 20 MG tablet Take 1 tablet (20 mg total) by mouth at bedtime. 90 tablet 1  . SUMAtriptan (IMITREX) 50 MG tablet Take 50 mg by mouth every 2 (two) hours as needed for migraine. May repeat in 2 hours if headache persists or recurs.    . traZODone (DESYREL) 100 MG tablet Take 50 mg by mouth at bedtime.     . traZODone (DESYREL) 50 MG tablet TAKE 1 TABLET BY MOUTH EVERYDAY AT BEDTIME 90 tablet 1  . UNABLE TO FIND 10 mg. Eye Vit C with Leutin    . nitroGLYCERIN (NITROSTAT) 0.4 MG SL tablet Place 1 tablet (0.4 mg total) under the tongue every 5 (five) minutes as needed for chest pain. 25 tablet 3   No current facility-administered medications on file prior to visit.     Allergies:   Allergies  Allergen Reactions  . Daypro [Oxaprozin] Swelling    facial  . Lisinopril     cough  . Other Itching    narcotics  . Simvastatin     REACTION: myalgias and  fatigue    Physical Exam General: well developed, well nourished middle-aged Caucasian lady, seated, in no evident distress Head: head normocephalic and atraumatic.   Neck: supple with no carotid or supraclavicular bruits Cardiovascular: regular rate and rhythm, no murmurs Musculoskeletal: no deformity Skin:  no rash/petichiae.   Vascular:  Normal pulses all extremities. Adson test negative  Neurologic Exam Mental Status: Awake and fully alert. Oriented to place and time. Recent and remote memory intact. Attention span, concentration and fund of knowledge appropriate. Mood and affect appropriate.  Recall 3/3.  Animal naming test 15.  Clock drawing 4/4.  Able to copy intersecting pentagons. Cranial Nerves: Fundoscopic exam reveals sharp disc margins. Pupils equal, briskly reactive to light. Extraocular movements full without nystagmus. Visual fields full to confrontation. Hearing diminished on the right despite a hearing aid.. Facial sensation intact. Face, tongue, palate moves normally and symmetrically.  Motor: Normal bulk and  tone. Normal strength in all tested extremity muscles. Sensory.: intact to touch , pinprick , position and vibratory sensation.  Coordination: Rapid alternating movements normal in all extremities. Finger-to-nose and heel-to-shin performed accurately bilaterally.  Head shaking test is negative.  Positive Fukuda test with patient taking several steps of the base and rotating to the left. Gait and Station: Arises from chair without difficulty. Stance is normal. Gait demonstrates normal stride length and balance . Able to heel, toe and tandem walk without difficulty.  Reflexes: 1+ and symmetric. Toes downgoing.      ASSESSMENT: 44 year lady with transient recurrent episodes of right hemicranial pulsations, rumbling sensation of unclear etiology. She was found to have severe left subclavian origin stenosis status post endovascular revascularization of severe symptomatic  stenosis of the left subclavian artery with stent assisted angioplasty  11/09/14  with recurrent symptoms and restenosis requiring repeat angioplasty in September 2017 .  She has mild subjective memory difficulties which appear stable.  New complaints of intermittent dizziness likely due to benign paroxysmal positional vertigo.  PLAN: I had a long discussion with the patient with her symptoms of positional vertigo which likely represent benign paroxysmal positional vertigo.  I recommend she do vestibular stabilization exercises and will refer her to vestibular rehabilitation for the same.  She will continue participation in cognitively challenging activities like solving crossword puzzles, playing bridge or sodoku.  She will return for follow-up in the future only if necessary and no routine schedule appointment was made.I again advised the patient to consider stopping Plavix and staying on aspirin alone as it has been several years since her subclavian stent and there is no long-term need for dual antiplatelet therapy in my opinion.  Continue regular surveillance carotid Dopplers for her subclavian stent in Dr. Arlean Hopping office.  Greater than 50% time during this 25 minute visit was spent on counseling and coordination of care about vertebrobasilar steal, stroke and TIA .   Antony Contras, MD Note: This document was prepared with digital dictation and possible smart phrase technology. Any transcriptional errors that result from this process are unintentional.

## 2018-03-05 ENCOUNTER — Ambulatory Visit: Payer: PPO | Admitting: Cardiology

## 2018-03-05 DIAGNOSIS — H95112 Chronic inflammation of postmastoidectomy cavity, left ear: Secondary | ICD-10-CM | POA: Diagnosis not present

## 2018-03-05 DIAGNOSIS — H906 Mixed conductive and sensorineural hearing loss, bilateral: Secondary | ICD-10-CM | POA: Diagnosis not present

## 2018-03-13 ENCOUNTER — Encounter: Payer: Self-pay | Admitting: Cardiology

## 2018-03-13 ENCOUNTER — Ambulatory Visit: Payer: PPO | Admitting: Cardiology

## 2018-03-13 VITALS — BP 130/60 | HR 67 | Ht 63.0 in | Wt 158.8 lb

## 2018-03-13 DIAGNOSIS — I471 Supraventricular tachycardia, unspecified: Secondary | ICD-10-CM

## 2018-03-13 DIAGNOSIS — I10 Essential (primary) hypertension: Secondary | ICD-10-CM | POA: Diagnosis not present

## 2018-03-13 DIAGNOSIS — E785 Hyperlipidemia, unspecified: Secondary | ICD-10-CM | POA: Diagnosis not present

## 2018-03-13 DIAGNOSIS — I5032 Chronic diastolic (congestive) heart failure: Secondary | ICD-10-CM

## 2018-03-13 DIAGNOSIS — I2510 Atherosclerotic heart disease of native coronary artery without angina pectoris: Secondary | ICD-10-CM

## 2018-03-13 DIAGNOSIS — I771 Stricture of artery: Secondary | ICD-10-CM

## 2018-03-13 DIAGNOSIS — I6523 Occlusion and stenosis of bilateral carotid arteries: Secondary | ICD-10-CM

## 2018-03-13 MED ORDER — FUROSEMIDE 20 MG PO TABS: 20.0000 mg | ORAL_TABLET | Freq: Every day | ORAL | 3 refills | Status: DC

## 2018-03-13 NOTE — Addendum Note (Signed)
Addended by: Sarina Ill on: 03/13/2018 10:50 AM   Modules accepted: Orders

## 2018-03-13 NOTE — Progress Notes (Signed)
Cardiology Office Note:    Date:  03/13/2018   ID:  Susan Gillespie, DOB 1945/12/08, MRN [id number]  PCP:  Binnie Rail, MD  Cardiologist:  Fransico Him, MD    Referring MD: Binnie Rail, MD   Chief Complaint  Patient presents with  . Coronary Artery Disease  . Hyperlipidemia    History of Present Illness:    Susan Gillespie is a 72 y.o. female with a hx of paroxysmal supraventricular tachycardia. She also has a history of left subclavian steal. She underwent stenting by Dr. Judi Cong and then repeat angioplasty on 01/05/2016 down to 80-85% patency.Dopplers 05/2017 with > 50% stenosis with patent stent in the left subclavian artery.  Repeat dopplers in 8/ 2019 showed 1-39% bilateral carotid stenosis with no mention of subclavian artery flow.   Cardiac CT for SOB 10/26/16 showed calcium score 92nd percentile for age/sex, significant calcific 3V disease with possibly hemodynamically significant stenosis in mid RCA and mLAD; subsequent FFR overread showed functionally significant D1 disease. She underwent LHC 11/10/16 showing moderate to moderately severe 3v calcific disease with anatomy and obstruction similar to that reported on cor CTA - Proximal 50-60% LAD, 70% proximal first diagonal, 50-60% prox-mid Cx ostial 60% and mid 50% RCa, normal LV function with elevated EDP c/w chronic diastolic CHF.  She is now on medical management.    She is here today for followup and is doing well.  She denies any chest pain or pressure, SOB, DOE, PND, orthopnea, LE edema, dizziness, palpitations or syncope. She is compliant with his meds and is tolerating meds with no SE.    Past Medical History:  Diagnosis Date  . Anxiety   . Arthritis   . Bleeding tendency ([place]) 05/16/2012   Hx post op bleeding (epidural hematoma s/p ACDF '10; LLQ hematoma due to superficial vessel fascial bleed post colon resection; Normal platelet count; saw Dr. [name] '14  . CAD in native artery    a. mod to mod-severe CAD  by cath 10/2016, med rx.  . Carotid stenosis, bilateral 08/31/2016   1-39% right and 40-59% left ICA stenosis by dopplers 05/2016  . Chronic diastolic CHF (congestive heart failure) ([place])   . Colon polyp   . Diverticulitis   . Family history of cardiovascular disease   . Fracture of right fibula   . GERD (gastroesophageal reflux disease)   . Headache(784.0)    mirgraines - history of  . HOH (hard of hearing)    wears hearing aids  . Hyperlipidemia   . Hypertension   . IBS (irritable bowel syndrome)    followed by Dr. [name]  . Liver cyst   . Melanoma ([place])    removed from back 04/2013  . Shortness of breath dyspnea    with exertion  . Stroke ([place]) 06/2012   mild  . SVT (supraventricular tachycardia) ([place])   . Urinary urgency   . UTI (lower urinary tract infection)    x 2 since June 2016    Past Surgical History:  Procedure Laterality Date  . ABDOMINAL HYSTERECTOMY    . ANGIOPLASTY     stent in subclavian LEFT 2016, angioplasty of stent 2017  . APPENDECTOMY    . AUGMENTATION MAMMAPLASTY    . BREAST EXCISIONAL BIOPSY Left    benign  . BREAST SURGERY     BIL breast augmentation  . CHOLECYSTECTOMY    . COLON SURGERY     Sigmoid Colectomy, returned for post op bleeding  . COLONOSCOPY W/  POLYPECTOMY    . COLONOSCOPY WITH PROPOFOL N/A 05/05/2016   Procedure: COLONOSCOPY WITH PROPOFOL;  Surgeon: Alphonsa Overall, MD;  Location: [facility];  Service: General;  Laterality: N/A;  . ESOPHAGOGASTRODUODENOSCOPY (EGD) WITH PROPOFOL N/A 05/05/2016   Procedure: ESOPHAGOGASTRODUODENOSCOPY (EGD) WITH PROPOFOL;  Surgeon: Alphonsa Overall, MD;  Location: [facility];  Service: General;  Laterality: N/A;  . EYE SURGERY     eyelid drooping fixed  . IR GENERIC HISTORICAL  12/24/2015   IR ANGIO INTRA EXTRACRAN SEL COM CAROTID INNOMINATE BILAT MOD SED 12/24/2015 Luanne Bras, MD [facility]  . IR GENERIC HISTORICAL  12/24/2015   IR ANGIOGRAM EXTREMITY LEFT 12/24/2015 Luanne Bras, MD [facility]  . IR GENERIC HISTORICAL  12/24/2015   IR ANGIO VERTEBRAL SEL VERTEBRAL UNI R MOD SED 12/24/2015 Luanne Bras, MD [facility]  . IR GENERIC HISTORICAL  01/05/2016   IR PTA NON CORO-LOWER EXTREM 01/05/2016 Luanne Bras, MD [facility]  . IR GENERIC HISTORICAL  03/07/2016   IR RADIOLOGIST EVAL & MGMT 03/07/2016 [facility]  . JOINT REPLACEMENT Left    thumb  . LEFT HEART CATH AND CORONARY ANGIOGRAPHY N/A 11/10/2016   Procedure: Left Heart Cath and Coronary Angiography;  Surgeon: Belva Crome, MD;  Location: [facility];  Service: Cardiovascular;  Laterality: N/A;  . MASTOID DEBRIDEMENT    . MASTOIDECTOMY REVISION    . RADIOLOGY WITH ANESTHESIA N/A 11/09/2014   Procedure: STENT PLACEMENT;  Surgeon: Luanne Bras, MD;  Location: [facility];  Service: Radiology;  Laterality: N/A;  . RADIOLOGY WITH ANESTHESIA N/A 05/31/2015   Procedure: RADIOLOGY WITH ANESTHESIA;  Surgeon: Luanne Bras, MD;  Location: [facility];  Service: Radiology;  Laterality: N/A;  . RADIOLOGY WITH ANESTHESIA N/A 01/05/2016   Procedure: RADIOLOGY WITH ANESTHESIA ANGIOPLASTY WITH STENTNG;  Surgeon: Luanne Bras, MD;  Location: [facility];  Service: Radiology;  Laterality: N/A;  . skin cancer excised  04/2013   Melonoma  . SPINE SURGERY     cervical fusion, returned to OR for post op  bleeding  . TONSILLECTOMY    . TONSILLECTOMY    . TUBAL LIGATION    . VAGINA SURGERY     anterior posterior repair    Current Medications: Current Meds  Medication Sig  . acetaminophen (TYLENOL) 650 MG CR tablet Take 650 mg by mouth daily as needed for pain.   Marland Kitchen ALPRAZolam (XANAX) 0.5 MG tablet Take 0.25 mg by mouth at bedtime as needed for anxiety or sleep.   Marland Kitchen aspirin 325 MG tablet Take 1 tablet (325 mg total) by mouth daily.  . celecoxib (CELEBREX) 200 MG capsule Take 200 mg by mouth daily as needed for mild pain.  . cetirizine (ZYRTEC) 10 MG tablet Take 10 mg by mouth every morning.   . clopidogrel (PLAVIX) 75 MG  tablet Take 1 tablet (75 mg total) by mouth daily.  . cycloSPORINE (RESTASIS) 0.05 % ophthalmic emulsion 1 drop 2 (two) times daily.  Marland Kitchen diltiazem (CARDIZEM CD) 180 MG 24 hr capsule TAKE 1 CAPSULE BY MOUTH EVERY DAY  . famotidine (PEPCID) 40 MG tablet Take 1 tablet (40 mg total) by mouth at bedtime.  . furosemide (LASIX) 20 MG tablet Take 1 tablet (20 mg total) by mouth daily. Please keep upcoming appt in November with Dr. [name] for future refills. Thank you  . IRON PO Take 65 mg by mouth daily.  Marland Kitchen loperamide (IMODIUM A-D) 2 MG tablet Take 2 mg by mouth daily.  Marland Kitchen losartan (COZAAR) 25  MG tablet Take 2 tablets (50 mg total) by mouth daily.  . Magnesium 250 MG TABS Take 250 mg by mouth daily.  . metoprolol tartrate (LOPRESSOR) 50 MG tablet Take 50 mg by mouth daily as needed (SVT symptoms).  . Omega-3 Fatty Acids (FISH OIL) 1200 MG CAPS Take by mouth.  . potassium chloride (K-DUR) 10 MEQ tablet Take 1 tablet (10 mEq total) by mouth daily.  . rosuvastatin (CRESTOR) 20 MG tablet Take 1 tablet (20 mg total) by mouth at bedtime.  . SUMAtriptan (IMITREX) 50 MG tablet Take 50 mg by mouth every 2 (two) hours as needed for migraine. May repeat in 2 hours if headache persists or recurs.  . traZODone (DESYREL) 100 MG tablet Take 50 mg by mouth at bedtime.   . traZODone (DESYREL) 50 MG tablet TAKE 1 TABLET BY MOUTH EVERYDAY AT BEDTIME  . UNABLE TO FIND 10 mg. Eye Vit C with Leutin     Allergies:   Daypro [oxaprozin]; Lisinopril; Other; and Simvastatin   Social History   Socioeconomic History  . Marital status: Divorced    Spouse name: Not on file  . Number of children: 2  . Years of education: 32  . Highest education level: Not on file  Occupational History  . 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 Financial resource strain: Not on file  . Food insecurity:    Worry: Not on file    Inability: Not on file  .  Transportation needs:    Medical: Not on file    Non-medical: Not on file  Tobacco Use  . Smoking status: Former Smoker    Packs/day: 2.00    Years: 22.00    Pack years: 44.00    Types: Cigarettes    Last attempt to quit: 04/23/1985    Years since quitting: 32.9  . Smokeless tobacco: Never Used  Substance and Sexual Activity  . Alcohol use: Yes    Alcohol/week: 4.0 standard drinks    Types: 4 Glasses of wine per week    Comment: 1 drink a  day  . Drug use: No  . Sexual activity: Yes    Birth control/protection: Surgical  Lifestyle  . Physical activity:    Days per week: Not on file    Minutes per session: Not on file  . Stress: Not on file  Relationships  . Social connections:    Talks on phone: Not on file    Gets together: Not on file    Attends religious service: Not on file    Active member of club or organization: Not on file    Attends meetings of clubs or organizations: Not on file    Relationship status: Not on file  Other Topics Concern  . Not on file  Social History Narrative   ** Merged History Encounter **       Domestic partner   Regular exercise-no   Right handed   Caffeine use-- 2 cups coffee daily, rare soda     Family History: The patient's family history includes Cancer in her other; Heart attack in her brother and sister; Heart disease in her brother, father, and sister; Hyperlipidemia in her other; Hypertension in her other; Stroke in her other and son.  ROS:   Please see the history of present illness.    ROS  All other systems reviewed and negative.   EKGs/Labs/Other Studies Reviewed:  The following studies were reviewed today: none  EKG:  EKG is  ordered today.  The ekg ordered today demonstrates NSR at 67bpm with no ST changes  Recent Labs: 10/09/2017: ALT 14; BUN 15; Creatinine, Ser 1.12; Hemoglobin 11.9; Platelets 262.0; Potassium 4.5; Sodium 137; TSH 1.11   Recent Lipid Panel    Component Value Date/Time   CHOL 161 10/09/2017  1604   CHOL 147 08/31/2016 0912   TRIG 189.0 (H) 10/09/2017 1604   HDL 72.70 10/09/2017 1604   HDL 78 08/31/2016 0912   CHOLHDL 2 10/09/2017 1604   VLDL 37.8 10/09/2017 1604   LDLCALC 51 10/09/2017 1604   LDLCALC 38 08/31/2016 0912    Physical Exam:    VS:  BP 130/60   Pulse 67   Ht 5\' 3"  (1.6 m)   Wt 158 lb 12.8 oz (72 kg)   LMP  (LMP Unknown)   SpO2 97%   BMI 28.13 kg/m     Wt Readings from Last 3 Encounters:  03/13/18 158 lb 12.8 oz (72 kg)  02/21/18 160 lb 9.6 oz (72.8 kg)  10/09/17 160 lb (72.6 kg)     GEN:  Well nourished, well developed in no acute distress HEENT: Normal NECK: No JVD; Right carotid bruit LYMPHATICS: No lymphadenopathy CARDIAC: RRR, no murmurs, rubs, gallops RESPIRATORY:  Clear to auscultation without rales, wheezing or rhonchi  ABDOMEN: Soft, non-tender, non-distended MUSCULOSKELETAL:  No edema; No deformity  SKIN: Warm and dry NEUROLOGIC:  Alert and oriented x 3 PSYCHIATRIC:  Normal affect   ASSESSMENT:    1. Coronary artery disease involving native coronary artery of native heart without angina pectoris   2. Chronic diastolic CHF (congestive heart failure), NYHA class 1 (HCC)   3. Carotid stenosis, bilateral   4. Paroxysmal supraventricular tachycardia ([place])   5. Essential hypertension, benign   6. 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H[facility])   7. Hyperlipidemia LDL goal <70    PLAN:    In order of problems listed above:  1.  ASCAD -  LHC 11/10/16 showed moderate to moderately severe 3v calcific disease with proximal 50-60% LAD, 70% proximal first diagonal, 50-60% prox-mid Cx ostial 60% and mid 50% RCA.  She denies any anginal sx.  She will continue on ASA 81mg  daily, Plavix 75mg  daily and statin.   2.  Chronic diastolic CHF - she appears euvolemic on exam today. She will continue on Lasix 20mg  daily.  K+ was 4.5 in June 2019.   3.  Carotid stenosis bilaterally - 1-39% stenosis bilaterally on dopplers 11/2017.  Continue statin and ASA.  4.  PSVT  - she denies any palpitations.  She will continue on Cardizem CD 180mg  daily and Lopressor PRN for breakthrough palpitations.   5.  HTN - BP is controlled on exam today.  She will continue on Cardizem CF 180mg  daily, Losartan 50mg  daily.  Creatinine was stable at 1.12 on 09/2017.    6.  Subclavian artery stenosis -  She has a history of left subclavian steal. She underwent stenting by Dr. Judi Cong and then repeat angioplasty on 01/05/2016 down to 80-85% patency. Carotid dopplers 2018 showed > 50% stenosis with patent stent in left subclavian.   Continue ASA, Plavix and statin.   7.  Hyperlipidemia - LDL goal is < 70.  Her LDL was 51 on 10/09/2017 and ALT normal at 14. She will continue on Crestor 20mg  daily.    Medication Adjustments/Labs and Tests Ordered: Current medicines are reviewed at length with the patient today.  Concerns regarding medicines are outlined above.  Orders Placed This Encounter  Procedures  . EKG 12-Lead   No orders of the defined types were placed in this encounter.   Signed, Fransico Him, MD  03/13/2018 10:45 AM    [place]

## 2018-03-13 NOTE — Patient Instructions (Signed)
Medication Instructions:  Your physician recommends that you continue on your current medications as directed. Please refer to the Current Medication list given to you today.  If you need a refill on your cardiac medications before your next appointment, please call your pharmacy.   Lab work:  If you have labs (blood work) drawn today and your tests are completely normal, you will receive your results only by: . MyChart Message (if you have MyChart) OR . A paper copy in the mail If you have any lab test that is abnormal or we need to change your treatment, we will call you to review the results.  Follow-Up: At [facility], you and your health needs are our priority.  As part of our continuing mission to provide you with exceptional heart care, we have created designated Provider Care Teams.  These Care Teams include your primary Cardiologist (physician) and Advanced Practice Providers (APPs -  Physician Assistants and Nurse Practitioners) who all work together to provide you with the care you need, when you need it. You will need a follow up appointment in 1 years.  Please call our office 2 months in advance to schedule this appointment.  You may see Traci Turner, MD or one of the following Advanced Practice Providers on your designated Care Team:   Brittainy Simmons, PA-C Dayna Dunn, PA-C . Michele Lenze, PA-C    

## 2018-03-31 NOTE — Patient Instructions (Addendum)
  Tests ordered today. Your results will be released to MyChart (or called to you) after review, usually within 72hours after test completion. If any changes need to be made, you will be notified at that same time.  Medications reviewed and updated.  Changes include :   none      Please followup in 6 months   

## 2018-03-31 NOTE — Progress Notes (Signed)
Subjective:    Patient ID: Susan Gillespie, female    DOB: [birth year], 72 y.o.   MRN: [id number]  HPI The patient is here for follow up.   CAD, CHF, Hypertension, SVT: She is taking her medication daily. She is compliant with a low sodium diet.  She has shortness of breath.  She denies chest pain, palpitations, edema  and regular headaches. She is active, but not exercising regularly.  She does monitor her blood pressure at home - it is usually 130's.   She takes an extra lasix as needed.  She has not had any leg swelling.  Prediabetes:  She is compliant with a low sugar/carbohydrate diet.  She is active but not exercising regularly.  Neck pain, OA:  She takes celebrex as needed.   IBS:  She takes imodium as needed for frequent BM's.   HRT: she was able to get off the estrogen patch.  This helped to prevent bladder spasms.  She has had bladder spasms and occasional hot flashes.  At this point she can tolerate the bladder spasms and does not want to take any other medication or tried vaginal estrogen.  She will monitor for now.  Insomnia:  She is taking trazodone nightly.    Anxiety:  She is taking xanax as needed for anxiety.  It does help when she takes it - it helps shut off her mind.   Medications and allergies reviewed with patient and updated if appropriate.  Patient Active Problem List   Diagnosis Date Noted  . Post menopausal syndrome 10/09/2017  . Migraine without status migrainosus, not intractable 10/09/2017  . Spondylosis of cervical region without myelopathy or radiculopathy 10/09/2017  . Insomnia 10/09/2017  . IBS (irritable bowel syndrome) 10/09/2017  . Chronic diastolic CHF (congestive heart failure), NYHA class 1 ([place]) 02/20/2017  . CAD in native artery 11/10/2016  . Carotid stenosis, bilateral 08/31/2016  . SOB (shortness of breath) 08/31/2016  . 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[place]) 01/05/2016  . Headache disorder 09/21/2014  . Pernicious anemia 09/15/2013  . B12  deficiency anemia 09/15/2013  . Situational anxiety 12/20/2012  . Spinal stenosis of lumbar region 07/05/2012  . Cerebral infarction ([place]) 06/28/2012  . Bleeding tendency ([place]) 05/16/2012  . Paroxysmal supraventricular tachycardia ([place]) 09/25/2011  . Essential hypertension, benign 08/22/2010  . Prediabetes 04/28/2009  . Hyperlipidemia LDL goal <70 12/16/2008  . GERD 12/16/2008  . OSTEOARTHRITIS 12/16/2008    Current Outpatient Medications on File Prior to Visit  Medication Sig Dispense Refill  . acetaminophen (TYLENOL) 650 MG CR tablet Take 650 mg by mouth daily as needed for pain.     Marland Kitchen aspirin 325 MG tablet Take 1 tablet (325 mg total) by mouth daily. 30 tablet 0  . celecoxib (CELEBREX) 200 MG capsule Take 200 mg by mouth daily as needed for mild pain.    . cetirizine (ZYRTEC) 10 MG tablet Take 10 mg by mouth every morning.     . clopidogrel (PLAVIX) 75 MG tablet Take 1 tablet (75 mg total) by mouth daily. 90 tablet 1  . cycloSPORINE (RESTASIS) 0.05 % ophthalmic emulsion 1 drop 2 (two) times daily.    Marland Kitchen diltiazem (CARDIZEM CD) 180 MG 24 hr capsule TAKE 1 CAPSULE BY MOUTH EVERY DAY 90 capsule 0  . famotidine (PEPCID) 40 MG tablet Take 1 tablet (40 mg total) by mouth at bedtime. 90 tablet 1  . furosemide (LASIX) 20 MG tablet Take 1 tablet (20 mg total) by mouth daily. Please keep  upcoming appt in November with Dr. [name] for future refills. Thank you 90 tablet 3  . IRON PO Take 65 mg by mouth daily.    Marland Kitchen loperamide (IMODIUM A-D) 2 MG tablet Take 2 mg by mouth daily.    Marland Kitchen losartan (COZAAR) 25 MG tablet Take 2 tablets (50 mg total) by mouth daily. 60 tablet 5  . Magnesium 250 MG TABS Take 250 mg by mouth daily.    . metoprolol tartrate (LOPRESSOR) 50 MG tablet Take 50 mg by mouth daily as needed (SVT symptoms).    . Omega-3 Fatty Acids (FISH OIL) 1200 MG CAPS Take by mouth.    . potassium chloride (K-DUR) 10 MEQ tablet Take 1 tablet (10 mEq total) by mouth daily. 90 tablet 1  .  rosuvastatin (CRESTOR) 20 MG tablet Take 1 tablet (20 mg total) by mouth at bedtime. 90 tablet 1  . SUMAtriptan (IMITREX) 50 MG tablet Take 50 mg by mouth every 2 (two) hours as needed for migraine. May repeat in 2 hours if headache persists or recurs.    . traZODone (DESYREL) 50 MG tablet TAKE 1 TABLET BY MOUTH EVERYDAY AT BEDTIME 90 tablet 1  . UNABLE TO FIND 10 mg. Eye Vit C with Leutin    . nitroGLYCERIN (NITROSTAT) 0.4 MG SL tablet Place 1 tablet (0.4 mg total) under the tongue every 5 (five) minutes as needed for chest pain. 25 tablet 3   No current facility-administered medications on file prior to visit.     Past Medical History:  Diagnosis Date  . Anxiety   . Arthritis   . Bleeding tendency ([place]) 05/16/2012   Hx post op bleeding (epidural hematoma s/p ACDF '10; LLQ hematoma due to superficial vessel fascial bleed post colon resection; Normal platelet count; saw Dr. [name] '14  . CAD in native artery    a. mod to mod-severe CAD by cath 10/2016, med rx.  . Carotid stenosis, bilateral 08/31/2016   1-39% right and 40-59% left ICA stenosis by dopplers 05/2016  . Chronic diastolic CHF (congestive heart failure) ([place])   . Colon polyp   . Diverticulitis   . Family history of cardiovascular disease   . Fracture of right fibula   . GERD (gastroesophageal reflux disease)   . Headache(784.0)    mirgraines - history of  . HOH (hard of hearing)    wears hearing aids  . Hyperlipidemia   . Hypertension   . IBS (irritable bowel syndrome)    followed by Dr. [name]  . Liver cyst   . Melanoma ([place])    removed from back 04/2013  . Shortness of breath dyspnea    with exertion  . Stroke ([place]) 06/2012   mild  . SVT (supraventricular tachycardia) ([place])   . Urinary urgency   . UTI (lower urinary tract infection)    x 2 since June 2016    Past Surgical History:  Procedure Laterality Date  . ABDOMINAL HYSTERECTOMY    . ANGIOPLASTY     stent in subclavian LEFT 2016, angioplasty of stent  2017  . APPENDECTOMY    . AUGMENTATION MAMMAPLASTY    . BREAST EXCISIONAL BIOPSY Left    benign  . BREAST SURGERY     BIL breast augmentation  . CHOLECYSTECTOMY    . COLON SURGERY     Sigmoid Colectomy, returned for post op bleeding  . COLONOSCOPY W/ POLYPECTOMY    . COLONOSCOPY WITH PROPOFOL N/A 05/05/2016   Procedure: COLONOSCOPY WITH PROPOFOL;  Surgeon: Shanon Brow  Lucia Gaskins, MD;  Location: [facility];  Service: General;  Laterality: N/A;  . ESOPHAGOGASTRODUODENOSCOPY (EGD) WITH PROPOFOL N/A 05/05/2016   Procedure: ESOPHAGOGASTRODUODENOSCOPY (EGD) WITH PROPOFOL;  Surgeon: Alphonsa Overall, MD;  Location: [facility];  Service: General;  Laterality: N/A;  . EYE SURGERY     eyelid drooping fixed  . IR GENERIC HISTORICAL  12/24/2015   IR ANGIO INTRA EXTRACRAN SEL COM CAROTID INNOMINATE BILAT MOD SED 12/24/2015 Luanne Bras, MD [facility]  . IR GENERIC HISTORICAL  12/24/2015   IR ANGIOGRAM EXTREMITY LEFT 12/24/2015 Luanne Bras, MD [facility]  . IR GENERIC HISTORICAL  12/24/2015   IR ANGIO VERTEBRAL SEL VERTEBRAL UNI R MOD SED 12/24/2015 Luanne Bras, MD [facility]  . IR GENERIC HISTORICAL  01/05/2016   IR PTA NON CORO-LOWER EXTREM 01/05/2016 Luanne Bras, MD [facility]  . IR GENERIC HISTORICAL  03/07/2016   IR RADIOLOGIST EVAL & MGMT 03/07/2016 [facility]  . JOINT REPLACEMENT Left    thumb  . LEFT HEART CATH AND CORONARY ANGIOGRAPHY N/A 11/10/2016   Procedure: Left Heart Cath and Coronary Angiography;  Surgeon: Belva Crome, MD;  Location: [facility];  Service: Cardiovascular;  Laterality: N/A;  . MASTOID DEBRIDEMENT    . MASTOIDECTOMY REVISION    . RADIOLOGY WITH ANESTHESIA N/A 11/09/2014   Procedure: STENT PLACEMENT;  Surgeon: Luanne Bras, MD;  Location: [facility];  Service: Radiology;  Laterality: N/A;  . RADIOLOGY WITH ANESTHESIA N/A 05/31/2015   Procedure: RADIOLOGY WITH ANESTHESIA;  Surgeon: Luanne Bras, MD;  Location: [facility];  Service: Radiology;   Laterality: N/A;  . RADIOLOGY WITH ANESTHESIA N/A 01/05/2016   Procedure: RADIOLOGY WITH ANESTHESIA ANGIOPLASTY WITH STENTNG;  Surgeon: Luanne Bras, MD;  Location: [facility];  Service: Radiology;  Laterality: N/A;  . skin cancer excised  04/2013   Melonoma  . SPINE SURGERY     cervical fusion, returned to OR for post op  bleeding  . TONSILLECTOMY    . TONSILLECTOMY    . TUBAL LIGATION    . VAGINA SURGERY     anterior posterior repair    Social History   Socioeconomic History  . Marital status: Divorced    Spouse name: Not on file  . Number of children: 2  . Years of education: 91  . Highest education level: Not on file  Occupational History  . 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 Financial resource strain: Not on file  . Food insecurity:    Worry: Not on file    Inability: Not on file  . Transportation needs:    Medical: Not on file    Non-medical: Not on file  Tobacco Use  . Smoking status: Former Smoker    Packs/day: 2.00    Years: 22.00    Pack years: 44.00    Types: Cigarettes    Last attempt to quit: 04/23/1985    Years since quitting: 32.9  . Smokeless tobacco: Never Used  Substance and Sexual Activity  . Alcohol use: Yes    Alcohol/week: 4.0 standard drinks    Types: 4 Glasses of wine per week    Comment: 1 drink a  day  . Drug use: No  . Sexual activity: Yes    Birth control/protection: Surgical  Lifestyle  . Physical activity:    Days per week: Not on file    Minutes per session: Not on file  .  Stress: Not on file  Relationships  . Social connections:    Talks on phone: Not on file    Gets together: Not on file    Attends religious service: Not on file    Active member of club or organization: Not on file    Attends meetings of clubs or organizations: Not on file    Relationship status: Not on file  Other Topics Concern  . Not on file  Social History Narrative   **  Merged History Encounter **       Domestic partner   Regular exercise-no   Right handed   Caffeine use-- 2 cups coffee daily, rare soda    Family History  Problem Relation Age of Onset  . Hyperlipidemia Other   . Hypertension Other   . Cancer Other        lung, esophagus, stomach  . Stroke Other   . Heart disease Father   . Heart disease Sister   . Heart attack Sister   . Heart disease Brother   . Heart attack Brother   . Stroke Son     Review of Systems  Constitutional: Negative for chills and fever.  Respiratory: Negative for cough, shortness of breath and wheezing.   Cardiovascular: Negative for chest pain, palpitations and leg swelling.  Neurological: Positive for headaches (occasional). Negative for light-headedness.       Objective:   Vitals:   04/05/18 0939  BP: (!) 160/62  Pulse: 72  Resp: 16  Temp: 98.7 F (37.1 C)  SpO2: 98%   BP Readings from Last 3 Encounters:  04/05/18 (!) 160/62  03/13/18 130/60  02/21/18 (!) 143/69   Wt Readings from Last 3 Encounters:  04/05/18 159 lb (72.1 kg)  03/13/18 158 lb 12.8 oz (72 kg)  02/21/18 160 lb 9.6 oz (72.8 kg)   Body mass index is 28.17 kg/m.   Physical Exam    Constitutional: Appears well-developed and well-nourished. No distress.  HENT:  Head: Normocephalic and atraumatic.  Neck: Neck supple. No tracheal deviation present. No thyromegaly present.  No cervical lymphadenopathy Cardiovascular: Normal rate, regular rhythm and normal heart sounds.   2/6 systolic murmur heard.  Bilateral carotid bruit .  No edema Pulmonary/Chest: Effort normal and breath sounds normal. No respiratory distress. No has no wheezes. No rales.  Skin: Skin is warm and dry. Not diaphoretic.  Psychiatric: Normal mood and affect. Behavior is normal.      Assessment & Plan:    See Problem List for Assessment and Plan of chronic medical problems.

## 2018-04-05 ENCOUNTER — Other Ambulatory Visit (INDEPENDENT_AMBULATORY_CARE_PROVIDER_SITE_OTHER): Payer: PPO

## 2018-04-05 ENCOUNTER — Encounter: Payer: Self-pay | Admitting: Internal Medicine

## 2018-04-05 ENCOUNTER — Ambulatory Visit (INDEPENDENT_AMBULATORY_CARE_PROVIDER_SITE_OTHER): Payer: PPO | Admitting: Internal Medicine

## 2018-04-05 VITALS — BP 160/62 | HR 72 | Temp 98.7°F | Resp 16 | Ht 63.0 in | Wt 159.0 lb

## 2018-04-05 DIAGNOSIS — G4700 Insomnia, unspecified: Secondary | ICD-10-CM

## 2018-04-05 DIAGNOSIS — N951 Menopausal and female climacteric states: Secondary | ICD-10-CM

## 2018-04-05 DIAGNOSIS — I2510 Atherosclerotic heart disease of native coronary artery without angina pectoris: Secondary | ICD-10-CM

## 2018-04-05 DIAGNOSIS — R7303 Prediabetes: Secondary | ICD-10-CM

## 2018-04-05 DIAGNOSIS — D519 Vitamin B12 deficiency anemia, unspecified: Secondary | ICD-10-CM

## 2018-04-05 DIAGNOSIS — F418 Other specified anxiety disorders: Secondary | ICD-10-CM

## 2018-04-05 DIAGNOSIS — I5032 Chronic diastolic (congestive) heart failure: Secondary | ICD-10-CM

## 2018-04-05 DIAGNOSIS — M1991 Primary osteoarthritis, unspecified site: Secondary | ICD-10-CM

## 2018-04-05 DIAGNOSIS — I10 Essential (primary) hypertension: Secondary | ICD-10-CM

## 2018-04-05 LAB — COMPREHENSIVE METABOLIC PANEL
ALT: 12 U/L (ref 0–35)
AST: 16 U/L (ref 0–37)
Albumin: 4.3 g/dL (ref 3.5–5.2)
Alkaline Phosphatase: 38 U/L — ABNORMAL LOW (ref 39–117)
BILIRUBIN TOTAL: 0.3 mg/dL (ref 0.2–1.2)
BUN: 16 mg/dL (ref 6–23)
CO2: 28 mEq/L (ref 19–32)
Calcium: 9.6 mg/dL (ref 8.4–10.5)
Chloride: 103 mEq/L (ref 96–112)
Creatinine, Ser: 0.95 mg/dL (ref 0.40–1.20)
GFR: 61.12 mL/min (ref >60.00)
Glucose, Bld: 109 mg/dL — ABNORMAL HIGH (ref 70–99)
Potassium: 4 mEq/L (ref 3.5–5.1)
Sodium: 138 mEq/L (ref 135–145)
Total Protein: 6.8 g/dL (ref 6.0–8.3)

## 2018-04-05 LAB — CBC WITH DIFFERENTIAL/PLATELET
BASOS ABS: 0 10*3/uL (ref 0.0–0.1)
Basophils Relative: 0.8 % (ref 0.0–3.0)
Eosinophils Absolute: 0.2 10*3/uL (ref 0.0–0.7)
Eosinophils Relative: 4.6 % (ref 0.0–5.0)
HEMATOCRIT: 35.8 % — AB (ref 36.0–46.0)
Hemoglobin: 12.2 g/dL (ref 12.0–15.0)
LYMPHS PCT: 22.3 % (ref 12.0–46.0)
Lymphs Abs: 1.1 10*3/uL (ref 0.7–4.0)
MCHC: 34.1 g/dL (ref 30.0–36.0)
MCV: 98.7 fl (ref 78.0–100.0)
Monocytes Absolute: 0.6 10*3/uL (ref 0.1–1.0)
Monocytes Relative: 10.9 % (ref 3.0–12.0)
NEUTROS ABS: 3.2 10*3/uL (ref 1.4–7.7)
Neutrophils Relative %: 61.4 % (ref 43.0–77.0)
Platelets: 270 10*3/uL (ref 150.0–400.0)
RBC: 3.63 Mil/uL — ABNORMAL LOW (ref 3.87–5.11)
RDW: 12.6 % (ref 11.5–15.5)
WBC: 5.2 10*3/uL (ref 4.0–10.5)

## 2018-04-05 LAB — LIPID PANEL
Cholesterol: 148 mg/dL (ref 0–200)
HDL: 71 mg/dL (ref >39.00)
LDL CALC: 49 mg/dL (ref 0–99)
NonHDL: 76.61
Total CHOL/HDL Ratio: 2
Triglycerides: 140 mg/dL (ref 0.0–149.0)
VLDL: 28 mg/dL (ref 0.0–40.0)

## 2018-04-05 LAB — HEMOGLOBIN A1C: Hgb A1c MFr Bld: 6.1 % (ref 4.6–6.5)

## 2018-04-05 LAB — VITAMIN B12: Vitamin B-12: 215 pg/mL (ref 211–911)

## 2018-04-05 LAB — TSH: TSH: 1.52 u[IU]/mL (ref 0.35–4.50)

## 2018-04-05 MED ORDER — ALPRAZOLAM 0.5 MG PO TABS: 0.2500 mg | ORAL_TABLET | Freq: Every evening | ORAL | 0 refills | Status: DC | PRN

## 2018-04-05 NOTE — Assessment & Plan Note (Signed)
Has discontinued estrogen patch Has had occasional hot flashes and bladder spasms, but both tolerable She would like to just monitor for now Discussed that we can consider transvaginal estrogen if needed

## 2018-04-05 NOTE — Assessment & Plan Note (Signed)
Takes Xanax as needed only Well-tolerated, effective Continue

## 2018-04-05 NOTE — Assessment & Plan Note (Signed)
Euvolemic on exam Denies leg swelling, shortness of breath Continue current medication-can take extra Lasix if needed CMP

## 2018-04-05 NOTE — Assessment & Plan Note (Signed)
Blood pressure typically well controlled-she does monitor it at home Continue current medications at current doses CMP

## 2018-04-05 NOTE — Assessment & Plan Note (Signed)
She denies chest pain, palpitations and shortness of breath Continue current medications Following with cardiology CBC, CMP, lipid, TSH

## 2018-04-05 NOTE — Assessment & Plan Note (Signed)
History of B12 deficiency Not currently taking B12 Check level

## 2018-04-05 NOTE — Assessment & Plan Note (Signed)
Takes Celebrex as needed

## 2018-04-05 NOTE — Assessment & Plan Note (Signed)
Check A1c Low sugar/carbohydrate diet Continue increased activity

## 2018-04-05 NOTE — Assessment & Plan Note (Signed)
Taking trazodone nightly Effective Continue

## 2018-04-11 ENCOUNTER — Ambulatory Visit: Payer: PPO | Admitting: Internal Medicine

## 2018-04-26 ENCOUNTER — Other Ambulatory Visit: Payer: Self-pay | Admitting: Internal Medicine

## 2018-04-29 ENCOUNTER — Other Ambulatory Visit: Payer: Self-pay | Admitting: Physician Assistant

## 2018-05-01 ENCOUNTER — Other Ambulatory Visit: Payer: Self-pay | Admitting: Cardiology

## 2018-05-30 ENCOUNTER — Other Ambulatory Visit (HOSPITAL_COMMUNITY): Payer: Self-pay | Admitting: Interventional Radiology

## 2018-05-30 DIAGNOSIS — I771 Stricture of artery: Secondary | ICD-10-CM

## 2018-06-05 ENCOUNTER — Other Ambulatory Visit: Payer: Self-pay | Admitting: Internal Medicine

## 2018-06-06 NOTE — Telephone Encounter (Signed)
Done erx 

## 2018-06-06 NOTE — Telephone Encounter (Signed)
MD is out of the office pls advise on refill../lmb 

## 2018-06-12 ENCOUNTER — Ambulatory Visit (HOSPITAL_COMMUNITY)
Admission: RE | Admit: 2018-06-12 | Discharge: 2018-06-12 | Disposition: A | Discharge status: Home / Self Care | Payer: PPO | Source: Ambulatory Visit | Attending: Interventional Radiology | Admitting: Interventional Radiology

## 2018-06-12 DIAGNOSIS — I771 Stricture of artery: Secondary | ICD-10-CM | POA: Diagnosis not present

## 2018-06-13 ENCOUNTER — Other Ambulatory Visit (HOSPITAL_COMMUNITY): Payer: Self-pay | Admitting: Interventional Radiology

## 2018-06-13 DIAGNOSIS — I771 Stricture of artery: Secondary | ICD-10-CM

## 2018-06-25 ENCOUNTER — Ambulatory Visit (HOSPITAL_COMMUNITY)
Admission: RE | Admit: 2018-06-25 | Discharge: 2018-06-25 | Disposition: A | Discharge status: Home / Self Care | Payer: PPO | Source: Ambulatory Visit | Attending: Interventional Radiology | Admitting: Interventional Radiology

## 2018-06-25 DIAGNOSIS — I771 Stricture of artery: Secondary | ICD-10-CM | POA: Diagnosis not present

## 2018-06-25 DIAGNOSIS — Z9889 Other specified postprocedural states: Secondary | ICD-10-CM | POA: Diagnosis not present

## 2018-06-25 DIAGNOSIS — R030 Elevated blood-pressure reading, without diagnosis of hypertension: Secondary | ICD-10-CM | POA: Diagnosis not present

## 2018-06-25 DIAGNOSIS — I6522 Occlusion and stenosis of left carotid artery: Secondary | ICD-10-CM | POA: Diagnosis not present

## 2018-06-25 NOTE — Progress Notes (Signed)
Chief Complaint: Patient was seen in consultation today for left subclavian artery stenosis s/p revascularization.  Referring Physician(s): Garvin Fila  Supervising Physician: Luanne Bras  Patient Status: [facility] - Out-pt  History of Present Illness: Susan Gillespie is a 73 y.o. female with a past medical history as below, with pertinent past medical history including hypertension, hyperlipidemia, SVT, chronic diastolic HF, CAD, CVA 05/7060, migraines, melanoma, and anxiety. She is known to [facility] and has been followed by Dr. Estanislado Pandy since 09/2014. She first presented to our department at the request of Dr. Leonie Man for management of vertebrobasilar insufficiency/right vertebral artery stenosis/subclavian steal syndrome. She first consulted with Dr. Estanislado Pandy 10/13/2014 to discuss management options. She underwent an image-guided cerebral arteriogram with revascularization of her left subclavian artery stenosis using stent assisted angioplasty 11/09/2014 by Dr. Estanislado Pandy. She was followed with routine imaging scans to monitor for changes and was unfortunately found to have intra-stent stenosis. She then underwent an image-guided cerebral arteriogram with revascularization of left subclavian artery intra-stent stenosis using balloon angioplasty 01/05/2016 by Dr. Estanislado Pandy. She has since been followed with routine imaging scans to monitor for changes.  US carotids 06/12/2018: 1. Right Carotid: Velocities in the right ICA are consistent with a 1-39% stenosis. 2. Left Carotid: Velocities in the left ICA are consistent with a 40-59% stenosis. The ECA appears >50% stenosed. 3. Vertebrals: Right vertebral artery demonstrates antegrade flow. Left vertebral artery demonstrates retrograde flow. 4. Subclavians: Left subclavian artery was stenotic. Normal flow hemodynamics were seen in the right subclavian artery.  Patient presents today to discuss findings of her recent US carotids 06/12/2018. Patient awake  and alert sitting in chair. Accompanied by husband. Complains of left arm fatigue when washing hair. Denies headache, weakness, numbness/tingling, dizziness, vision changes, hearing changes, tinnitus, or speech difficulty.  Patient is currently taking Plavix 75 mg once daily and Aspirin 325 mg once daily.   Past Medical History:  Diagnosis Date  . Anxiety   . Arthritis   . Bleeding tendency ([place]) 05/16/2012   Hx post op bleeding (epidural hematoma s/p ACDF '10; LLQ hematoma due to superficial vessel fascial bleed post colon resection; Normal platelet count; saw Dr. [name] '14  . CAD in native artery    a. mod to mod-severe CAD by cath 10/2016, med rx.  . Carotid stenosis, bilateral 08/31/2016   1-39% right and 40-59% left ICA stenosis by dopplers 05/2016  . Chronic diastolic CHF (congestive heart failure) ([place])   . Colon polyp   . Diverticulitis   . Family history of cardiovascular disease   . Fracture of right fibula   . GERD (gastroesophageal reflux disease)   . Headache(784.0)    mirgraines - history of  . HOH (hard of hearing)    wears hearing aids  . Hyperlipidemia   . Hypertension   . IBS (irritable bowel syndrome)    followed by Dr. [name]  . Liver cyst   . Melanoma ([place])    removed from back 04/2013  . Shortness of breath dyspnea    with exertion  . Stroke ([place]) 06/2012   mild  . SVT (supraventricular tachycardia) ([place])   . Urinary urgency   . UTI (lower urinary tract infection)    x 2 since June 2016    Past Surgical History:  Procedure Laterality Date  . ABDOMINAL HYSTERECTOMY    . ANGIOPLASTY     stent in subclavian LEFT 2016, angioplasty of stent 2017  . APPENDECTOMY    . AUGMENTATION MAMMAPLASTY    .  BREAST EXCISIONAL BIOPSY Left    benign  . BREAST SURGERY     BIL breast augmentation  . CHOLECYSTECTOMY    . COLON SURGERY     Sigmoid Colectomy, returned for post op bleeding  . COLONOSCOPY W/ POLYPECTOMY    . COLONOSCOPY WITH PROPOFOL N/A  05/05/2016   Procedure: COLONOSCOPY WITH PROPOFOL;  Surgeon: Alphonsa Overall, MD;  Location: [facility];  Service: General;  Laterality: N/A;  . ESOPHAGOGASTRODUODENOSCOPY (EGD) WITH PROPOFOL N/A 05/05/2016   Procedure: ESOPHAGOGASTRODUODENOSCOPY (EGD) WITH PROPOFOL;  Surgeon: Alphonsa Overall, MD;  Location: [facility];  Service: General;  Laterality: N/A;  . EYE SURGERY     eyelid drooping fixed  . IR GENERIC HISTORICAL  12/24/2015   IR ANGIO INTRA EXTRACRAN SEL COM CAROTID INNOMINATE BILAT MOD SED 12/24/2015 Luanne Bras, MD [facility]  . IR GENERIC HISTORICAL  12/24/2015   IR ANGIOGRAM EXTREMITY LEFT 12/24/2015 Luanne Bras, MD [facility]  . IR GENERIC HISTORICAL  12/24/2015   IR ANGIO VERTEBRAL SEL VERTEBRAL UNI R MOD SED 12/24/2015 Luanne Bras, MD [facility]  . IR GENERIC HISTORICAL  01/05/2016   IR PTA NON CORO-LOWER EXTREM 01/05/2016 Luanne Bras, MD [facility]  . IR GENERIC HISTORICAL  03/07/2016   IR RADIOLOGIST EVAL & MGMT 03/07/2016 [facility]  . JOINT REPLACEMENT Left    thumb  . LEFT HEART CATH AND CORONARY ANGIOGRAPHY N/A 11/10/2016   Procedure: Left Heart Cath and Coronary Angiography;  Surgeon: Belva Crome, MD;  Location: [facility];  Service: Cardiovascular;  Laterality: N/A;  . MASTOID DEBRIDEMENT    . MASTOIDECTOMY REVISION    . RADIOLOGY WITH ANESTHESIA N/A 11/09/2014   Procedure: STENT PLACEMENT;  Surgeon: Luanne Bras, MD;  Location: [place];  Service: Radiology;  Laterality: N/A;  . RADIOLOGY WITH ANESTHESIA N/A 05/31/2015   Procedure: RADIOLOGY WITH ANESTHESIA;  Surgeon: Luanne Bras, MD;  Location: [place];  Service: Radiology;  Laterality: N/A;  . RADIOLOGY WITH ANESTHESIA N/A 01/05/2016   Procedure: RADIOLOGY WITH ANESTHESIA ANGIOPLASTY WITH STENTNG;  Surgeon: Luanne Bras, MD;  Location: [place];  Service: Radiology;  Laterality: N/A;  . skin cancer excised  04/2013   Melonoma  . SPINE SURGERY     cervical fusion, returned to  OR for post op  bleeding  . TONSILLECTOMY    . TONSILLECTOMY    . TUBAL LIGATION    . VAGINA SURGERY     anterior posterior repair    Allergies: Daypro [oxaprozin]; Lisinopril; Other; and Simvastatin  Medications: Prior to Admission medications   Medication Sig Start Date End Date Taking? Authorizing Provider  acetaminophen (TYLENOL) 650 MG CR tablet Take 650 mg by mouth daily as needed for pain.     [provider]  ALPRAZolam Duanne Moron) 0.5 MG tablet TAKE 0.5 TABLETS (0.25 MG TOTAL) BY MOUTH AT BEDTIME AS NEEDED FOR ANXIETY OR SLEEP. 06/06/18   Biagio Borg, MD  aspirin 325 MG tablet Take 1 tablet (325 mg total) by mouth daily. 11/10/14   Tsosie Billing D, PA-C  celecoxib (CELEBREX) 200 MG capsule Take 200 mg by mouth daily as needed for mild pain.    [provider]  cetirizine (ZYRTEC) 10 MG tablet Take 10 mg by mouth every morning.     [provider]  clopidogrel (PLAVIX) 75 MG tablet TAKE 1 TABLET BY MOUTH EVERY DAY 04/26/18   Burns, Claudina Lick, MD  cycloSPORINE (RESTASIS) 0.05 % ophthalmic emulsion 1 drop 2 (two) times daily.  [provider]  diltiazem (CARDIZEM CD) 180 MG 24 hr capsule TAKE 1 CAPSULE BY MOUTH EVERY DAY 05/01/18   Camnitz, Ocie Doyne, MD  famotidine (PEPCID) 40 MG tablet Take 1 tablet (40 mg total) by mouth at bedtime. 11/13/17   Binnie Rail, MD  furosemide (LASIX) 20 MG tablet TAKE 1 TABLET BY MOUTH EVERY DAY 04/29/18   Dunn, Nedra Hai, PA-C  IRON PO Take 65 mg by mouth daily.    [provider]  loperamide (IMODIUM A-D) 2 MG tablet Take 2 mg by mouth daily.    [provider]  losartan (COZAAR) 25 MG tablet Take 2 tablets (50 mg total) by mouth daily. 02/12/18   Binnie Rail, MD  Magnesium 250 MG TABS Take 250 mg by mouth daily.    [provider]  metoprolol tartrate (LOPRESSOR) 50 MG tablet Take 50 mg by mouth daily as needed (SVT symptoms).    [provider]  nitroGLYCERIN (NITROSTAT) 0.4  MG SL tablet Place 1 tablet (0.4 mg total) under the tongue every 5 (five) minutes as needed for chest pain. 11/10/16 02/21/17  Eileen Stanford, PA-C  Omega-3 Fatty Acids (FISH OIL) 1200 MG CAPS Take by mouth.    [provider]  potassium chloride (K-DUR) 10 MEQ tablet Take 1 tablet (10 mEq total) by mouth daily. 11/13/17   Binnie Rail, MD  rosuvastatin (CRESTOR) 20 MG tablet Take 1 tablet (20 mg total) by mouth at bedtime. 11/13/17   Binnie Rail, MD  SUMAtriptan (IMITREX) 50 MG tablet Take 50 mg by mouth every 2 (two) hours as needed for migraine. May repeat in 2 hours if headache persists or recurs.    [provider]  traZODone (DESYREL) 50 MG tablet TAKE 1 TABLET BY MOUTH EVERYDAY AT BEDTIME 11/13/17   Burns, Claudina Lick, MD  UNABLE TO FIND 10 mg. Eye Vit C with Leutin    [provider]     Family History  Problem Relation Age of Onset  . Hyperlipidemia Other   . Hypertension Other   . Cancer Other        lung, esophagus, stomach  . Stroke Other   . Heart disease Father   . Heart disease Sister   . Heart attack Sister   . Heart disease Brother   . Heart attack Brother   . Stroke Son     Social History   Socioeconomic History  . Marital status: Divorced    Spouse name: Not on file  . Number of children: 2  . Years of education: 74  . Highest education level: Not on file  Occupational History  . 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 Financial resource strain: Not on file  . Food insecurity:    Worry: Not on file    Inability: Not on file  . Transportation needs:    Medical: Not on file    Non-medical: Not on file  Tobacco Use  . Smoking status: Former Smoker    Packs/day: 2.00    Years: 22.00    Pack years: 44.00    Types: Cigarettes    Last attempt to quit: 04/23/1985    Years since quitting: 33.1  . Smokeless tobacco: Never Used  Substance and Sexual  Activity  . Alcohol use: Yes    Alcohol/week: 4.0 standard drinks    Types: 4 Glasses of  wine per week    Comment: 1 drink a  day  . Drug use: No  . Sexual activity: Yes    Birth control/protection: Surgical  Lifestyle  . Physical activity:    Days per week: Not on file    Minutes per session: Not on file  . Stress: Not on file  Relationships  . Social connections:    Talks on phone: Not on file    Gets together: Not on file    Attends religious service: Not on file    Active member of club or organization: Not on file    Attends meetings of clubs or organizations: Not on file    Relationship status: Not on file  Other Topics Concern  . Not on file  Social History Narrative   ** Merged History Encounter **       Domestic partner   Regular exercise-no   Right handed   Caffeine use-- 2 cups coffee daily, rare soda     Review of Systems: A 12 point ROS discussed and pertinent positives are indicated in the HPI above.  All other systems are negative.  Review of Systems  Constitutional: Positive for fatigue. Negative for chills and fever.  HENT: Negative for hearing loss and tinnitus.   Eyes: Negative for visual disturbance.  Respiratory: Negative for shortness of breath and wheezing.   Cardiovascular: Negative for chest pain and palpitations.  Neurological: Negative for dizziness, speech difficulty, weakness, numbness and headaches.  Psychiatric/Behavioral: Negative for behavioral problems and confusion.    Vital Signs: BP (!) 148/74 (BP Location: Right Arm)   LMP  (LMP Unknown)   Physical Exam Constitutional:      General: She is not in acute distress.    Appearance: Normal appearance.  Pulmonary:     Effort: Pulmonary effort is normal. No respiratory distress.  Skin:    General: Skin is warm and dry.  Neurological:     Mental Status: She is alert and oriented to person, place, and time.  Psychiatric:        Mood and Affect: Mood normal.        Behavior:  Behavior normal.        Thought Content: Thought content normal.        Judgment: Judgment normal.      Imaging: Vas US Carotid  Result Date: 06/12/2018 Carotid Arterial Duplex Study Indications:       Carotid artery disease. Comparison Study:  11/30/17 Performing Technologist: June Leap RDMS, RVT  Examination Guidelines: A complete evaluation includes B-mode imaging, spectral Doppler, color Doppler, and power Doppler as needed of all accessible portions of each vessel. Bilateral testing is considered an integral part of a complete examination. Limited examinations for reoccurring indications may be performed as noted.  Right Carotid Findings: +----------+--------+--------+--------+--------+-------------------------------+           PSV cm/sEDV cm/sStenosisDescribeComments                        +----------+--------+--------+--------+--------+-------------------------------+ CCA Prox  64      12                                                      +----------+--------+--------+--------+--------+-------------------------------+ CCA Distal94      16                                                      +----------+--------+--------+--------+--------+-------------------------------+  ICA Prox  108     23      1-39%   calcificvelocities may be                                                         underestimated due to acoustic                                            shadowing                       +----------+--------+--------+--------+--------+-------------------------------+ ICA Distal58      17                                                      +----------+--------+--------+--------+--------+-------------------------------+ ECA       135     20                                                      +----------+--------+--------+--------+--------+-------------------------------+ +----------+--------+-------+----------------+-------------------+            PSV cm/sEDV cmsDescribe        Arm Pressure (mmHG) +----------+--------+-------+----------------+-------------------+ [id number]            Multiphasic, WNL                    +----------+--------+-------+----------------+-------------------+ +---------+--------+--+--------+--+---------+ VertebralPSV cm/s82EDV cm/s13Antegrade +---------+--------+--+--------+--+---------+  Left Carotid Findings: +----------+--------+--------+--------+--------+-------------------------------+           PSV cm/sEDV cm/sStenosisDescribeComments                        +----------+--------+--------+--------+--------+-------------------------------+ CCA Prox  97      19                                                      +----------+--------+--------+--------+--------+-------------------------------+ CCA Distal73      21                                                      +----------+--------+--------+--------+--------+-------------------------------+ ICA Prox  240     56      40-59%          velocities may be                                                         underestimated due to acoustic  shadowing                       +----------+--------+--------+--------+--------+-------------------------------+ ICA Distal86      13                                                      +----------+--------+--------+--------+--------+-------------------------------+ ECA       464     69      >50%                                            +----------+--------+--------+--------+--------+-------------------------------+ +----------+--------+--------+--------+-------------------+ SubclavianPSV cm/sEDV cm/sDescribeArm Pressure (mmHG) +----------+--------+--------+--------+-------------------+           448     27      Stenotic                    +----------+--------+--------+--------+-------------------+  +---------+--------+--+--------+----------+ VertebralPSV cm/s31EDV cm/sRetrograde +---------+--------+--+--------+----------+  Summary: Right Carotid: Velocities in the right ICA are consistent with a 1-39% stenosis. Left Carotid: Velocities in the left ICA are consistent with a 40-59% stenosis.               The ECA appears >50% stenosed. Vertebrals:  Right vertebral artery demonstrates antegrade flow. Left vertebral              artery demonstrates retrograde flow. Subclavians: Left subclavian artery was stenotic. Normal flow hemodynamics were              seen in the right subclavian artery. *See table(s) above for measurements and observations.  Electronically signed by Curt Jews MD on 06/12/2018 at 6:27:00 PM.    Final     Labs:  CBC: Recent Labs    10/09/17 1604 04/05/18 1051  WBC 5.4 5.2  HGB 11.9* 12.2  HCT 34.8* 35.8*  PLT 262.0 270.0    BMP: Recent Labs    10/09/17 1604 04/05/18 1051  NA 137 138  K 4.5 4.0  CL 101 103  CO2 29 28  GLUCOSE 108* 109*  BUN 15 16  CALCIUM 9.6 9.6  CREATININE 1.12 0.95    LIVER FUNCTION TESTS: Recent Labs    10/09/17 1604 04/05/18 1051  BILITOT 0.4 0.3  AST 17 16  ALT 14 12  ALKPHOS 2 38*  PROT 6.7 6.8  ALBUMIN 4.3 4.3     Assessment and Plan:  Left subclavian artery stenosis s/p revascularization using stent assisted angioplasty (two stents) 11/09/2014 by Dr. Estanislado Pandy; left subclavian artery intra-stent stenosis s/p revascularization using balloon angioplasty 01/05/2016 by Dr. Estanislado Pandy.  Dr. Estanislado Pandy was present for consultation. Reviewed imaging with patient and husband. Brought to their attention was patient's left subclavian artery stenosis, both prior to and following endovascular intervention. Next discussed patient's recent carotid ultrasound. Explained that the results of this test demonstrated probable stenosis of her left subclavian artery (left vertebral artery demonstrates retrograde flow). In addition, BP  today revealed higher pressure on right arm (148/74) compared to left arm (122/80) which further indicates probable stenosis of left subclavian artery. Explained that the best course of management for patient at this time is with imaging scans to evaluate patient's left subclavian artery/stents.  Plan for follow-up with CTA head/neck (with contrast) ASAP. Informed  patient that our schedulers will call her to set up this imaging scan. Instructed patient to continue taking Plavix 75 mg once daily and Aspirin 325 mg once daily.  All questions answered and concerns addressed. Patient conveys understanding and agrees with plan.  Thank you for this interesting consult.  I greatly enjoyed meeting Susan Gillespie and look forward to participating in their care.  A copy of this report was sent to the requesting provider on this date.  Electronically Signed: Earley Abide, PA-C 06/25/2018, 2:35 PM   I spent a total of 40 Minutes in face to face in clinical consultation, greater than 50% of which was counseling/coordinating care for left subclavian artery stenosis s/p revascularization.

## 2018-07-18 ENCOUNTER — Telehealth (HOSPITAL_COMMUNITY): Payer: Self-pay

## 2018-07-18 NOTE — Telephone Encounter (Signed)
Pt is due for a f/u cta head/neck. Due to [place] restrictions, we are waiting until the beginning of May to schedule. Pt agreed with this plan. AW

## 2018-07-20 ENCOUNTER — Encounter: Payer: Self-pay | Admitting: Internal Medicine

## 2018-07-20 ENCOUNTER — Other Ambulatory Visit: Payer: Self-pay | Admitting: Internal Medicine

## 2018-07-21 MED ORDER — TRAZODONE HCL 50 MG PO TABS: ORAL_TABLET | ORAL | 1 refills | Status: DC

## 2018-08-03 ENCOUNTER — Other Ambulatory Visit: Payer: Self-pay | Admitting: Internal Medicine

## 2018-08-28 DIAGNOSIS — H02002 Unspecified entropion of right lower eyelid: Secondary | ICD-10-CM | POA: Diagnosis not present

## 2018-08-28 DIAGNOSIS — H353112 Nonexudative age-related macular degeneration, right eye, intermediate dry stage: Secondary | ICD-10-CM | POA: Diagnosis not present

## 2018-08-28 DIAGNOSIS — H353121 Nonexudative age-related macular degeneration, left eye, early dry stage: Secondary | ICD-10-CM | POA: Diagnosis not present

## 2018-09-11 ENCOUNTER — Other Ambulatory Visit (HOSPITAL_COMMUNITY): Payer: Self-pay | Admitting: Interventional Radiology

## 2018-09-11 DIAGNOSIS — I6523 Occlusion and stenosis of bilateral carotid arteries: Secondary | ICD-10-CM

## 2018-09-24 DIAGNOSIS — L570 Actinic keratosis: Secondary | ICD-10-CM | POA: Diagnosis not present

## 2018-09-24 DIAGNOSIS — Z1283 Encounter for screening for malignant neoplasm of skin: Secondary | ICD-10-CM | POA: Diagnosis not present

## 2018-09-24 DIAGNOSIS — L820 Inflamed seborrheic keratosis: Secondary | ICD-10-CM | POA: Diagnosis not present

## 2018-09-24 DIAGNOSIS — Z08 Encounter for follow-up examination after completed treatment for malignant neoplasm: Secondary | ICD-10-CM | POA: Diagnosis not present

## 2018-09-24 DIAGNOSIS — X32XXXD Exposure to sunlight, subsequent encounter: Secondary | ICD-10-CM | POA: Diagnosis not present

## 2018-09-24 DIAGNOSIS — D225 Melanocytic nevi of trunk: Secondary | ICD-10-CM | POA: Diagnosis not present

## 2018-09-24 DIAGNOSIS — Z85820 Personal history of malignant melanoma of skin: Secondary | ICD-10-CM | POA: Diagnosis not present

## 2018-09-30 ENCOUNTER — Encounter (HOSPITAL_COMMUNITY): Payer: Self-pay

## 2018-09-30 ENCOUNTER — Ambulatory Visit (HOSPITAL_COMMUNITY)
Admission: RE | Admit: 2018-09-30 | Discharge: 2018-09-30 | Disposition: A | Discharge status: Home / Self Care | Payer: PPO | Source: Ambulatory Visit | Attending: Interventional Radiology | Admitting: Interventional Radiology

## 2018-09-30 ENCOUNTER — Ambulatory Visit (HOSPITAL_COMMUNITY): Payer: PPO

## 2018-09-30 ENCOUNTER — Other Ambulatory Visit: Payer: Self-pay

## 2018-09-30 DIAGNOSIS — I771 Stricture of artery: Secondary | ICD-10-CM | POA: Diagnosis not present

## 2018-09-30 DIAGNOSIS — I6523 Occlusion and stenosis of bilateral carotid arteries: Secondary | ICD-10-CM

## 2018-09-30 DIAGNOSIS — I6522 Occlusion and stenosis of left carotid artery: Secondary | ICD-10-CM | POA: Diagnosis not present

## 2018-09-30 LAB — POCT I-STAT CREATININE: Creatinine, Ser: 1 mg/dL (ref 0.44–1.00)

## 2018-09-30 MED ORDER — IOHEXOL 350 MG/ML SOLN: 75.0000 mL | Freq: Once | INTRAVENOUS | Status: DC | PRN

## 2018-09-30 MED ORDER — IOHEXOL 350 MG/ML SOLN
75.0000 mL | Freq: Once | INTRAVENOUS | Status: AC | PRN
  Administered 2018-09-30: 75 mL via INTRAVENOUS

## 2018-10-01 ENCOUNTER — Telehealth (HOSPITAL_COMMUNITY): Payer: Self-pay

## 2018-10-01 NOTE — Telephone Encounter (Signed)
Pt would like to schedule treatment in 3 months. She will call to schedule sooner if she develops sx. AW

## 2018-10-02 DIAGNOSIS — M79641 Pain in right hand: Secondary | ICD-10-CM | POA: Insufficient documentation

## 2018-10-02 DIAGNOSIS — G5602 Carpal tunnel syndrome, left upper limb: Secondary | ICD-10-CM | POA: Diagnosis not present

## 2018-10-02 DIAGNOSIS — G458 Other transient cerebral ischemic attacks and related syndromes: Secondary | ICD-10-CM | POA: Insufficient documentation

## 2018-10-02 DIAGNOSIS — G5603 Carpal tunnel syndrome, bilateral upper limbs: Secondary | ICD-10-CM | POA: Diagnosis not present

## 2018-10-02 DIAGNOSIS — M13849 Other specified arthritis, unspecified hand: Secondary | ICD-10-CM | POA: Diagnosis not present

## 2018-10-02 DIAGNOSIS — G5601 Carpal tunnel syndrome, right upper limb: Secondary | ICD-10-CM | POA: Diagnosis not present

## 2018-10-02 DIAGNOSIS — M19049 Primary osteoarthritis, unspecified hand: Secondary | ICD-10-CM | POA: Insufficient documentation

## 2018-10-02 DIAGNOSIS — M79642 Pain in left hand: Secondary | ICD-10-CM | POA: Diagnosis not present

## 2018-10-07 ENCOUNTER — Telehealth (HOSPITAL_COMMUNITY): Payer: Self-pay

## 2018-10-07 NOTE — Telephone Encounter (Signed)
Returned pt's call regarding procedure scheduling, no answer, left vm. AW

## 2018-10-10 ENCOUNTER — Other Ambulatory Visit (HOSPITAL_COMMUNITY): Payer: Self-pay | Admitting: Interventional Radiology

## 2018-10-10 DIAGNOSIS — I771 Stricture of artery: Secondary | ICD-10-CM

## 2018-10-11 ENCOUNTER — Encounter: Payer: PPO | Admitting: Internal Medicine

## 2018-10-14 ENCOUNTER — Other Ambulatory Visit (HOSPITAL_COMMUNITY)
Admission: RE | Admit: 2018-10-14 | Discharge: 2018-10-14 | Disposition: A | Discharge status: Home / Self Care | Payer: PPO | Source: Ambulatory Visit | Attending: Interventional Radiology | Admitting: Interventional Radiology

## 2018-10-14 DIAGNOSIS — Z01812 Encounter for preprocedural laboratory examination: Secondary | ICD-10-CM | POA: Insufficient documentation

## 2018-10-14 DIAGNOSIS — Z1159 Encounter for screening for other viral diseases: Secondary | ICD-10-CM | POA: Diagnosis not present

## 2018-10-14 LAB — SARS CORONAVIRUS 2 (TAT 6-24 HRS): SARS Coronavirus 2: NEGATIVE

## 2018-10-15 ENCOUNTER — Other Ambulatory Visit: Payer: Self-pay

## 2018-10-15 ENCOUNTER — Other Ambulatory Visit: Payer: Self-pay | Admitting: Student

## 2018-10-15 ENCOUNTER — Encounter (HOSPITAL_COMMUNITY): Payer: Self-pay | Admitting: Certified Registered Nurse Anesthetist

## 2018-10-15 NOTE — Anesthesia Preprocedure Evaluation (Addendum)
Anesthesia Evaluation  Patient identified by MRN, date of birth, ID band  Reviewed: Allergy & Precautions, NPO status , Patient's Chart, lab work & pertinent test results  Airway Mallampati: II  TM Distance: >3 FB     Dental   Pulmonary former smoker,    breath sounds clear to auscultation       Cardiovascular hypertension, +CHF   Rhythm:Regular Rate:Normal     Neuro/Psych    GI/Hepatic Neg liver ROS, GERD  ,  Endo/Other  negative endocrine ROS  Renal/GU negative Renal ROS     Musculoskeletal   Abdominal   Peds  Hematology  (+) anemia ,   Anesthesia Other Findings   Reproductive/Obstetrics                            Anesthesia Physical Anesthesia Plan  ASA: III  Anesthesia Plan: MAC   Post-op Pain Management:    Induction: Intravenous  PONV Risk Score and Plan:   Airway Management Planned: Simple Face Mask  Additional Equipment:   Intra-op Plan:   Post-operative Plan:   Informed Consent: I have reviewed the patients History and Physical, chart, labs and discussed the procedure including the risks, benefits and alternatives for the proposed anesthesia with the patient or authorized representative who has indicated his/her understanding and acceptance.     Dental advisory given  Plan Discussed with:   Anesthesia Plan Comments: (Follows with Dr. [name] for CAD, HFpEF, PSVT. LHC 11/10/16 showed moderate to moderately severe 3v calcific disease with anatomy and obstruction similar to that reported on cor CTA - Proximal 50-60% LAD, 70% proximal first diagonal, 50-60% prox-mid Cx ostial 60% and mid 50% RCa, normal LV function with elevated EDP c/w chronic diastolic CHF. Medical therapy was recommended. Last seen by Dr. [name] 03/13/18, stable from CV standpoint at that time.   Carotid US 06/12/18: Right Carotid: Velocities in the right ICA are consistent with a 1-39%  stenosis.  Left Carotid: Velocities in the left ICA are consistent with a 40-59% stenosis. The ECA appears >50% stenosed.  Vertebrals:  Right vertebral artery demonstrates antegrade flow. Left vertebral artery demonstrates retrograde flow. Subclavians: Left subclavian artery was stenotic. Normal flow hemodynamics were seen in the right subclavian artery.   TTE 11/27/16: - Left ventricle: The cavity size was normal. Wall thickness was   normal. Systolic function was normal. The estimated ejection   fraction was in the range of 60% to 65%. Wall motion was normal;   there were no regional wall motion abnormalities. Features are   consistent with a pseudonormal left ventricular filling pattern,   with concomitant abnormal relaxation and increased filling   pressure (grade 2 diastolic dysfunction). - Mitral valve: Valve area by continuity equation (using LVOT   flow): 2.07 cm^2.)     Anesthesia Quick Evaluation

## 2018-10-15 NOTE — Progress Notes (Signed)
Pt denies SOB and chest pain. Pt stated that she is under the care of Dr. Fransico Him, Cardiology. Pt stated that she is unsure in she had a chest x ray within the last year. Pt denies labs after 09/30/18. Pt made aware to stop taking vitamins, fish oil and herbal medications. Do not take any NSAIDs ie: Ibuprofen, Advil, Naproxen (Aleve), Motrin, Celebrex, BC and Goody Powder. Pt stated that she tkes her Cardizem at [facility].  Pt denies that she and family members tested positive for COVID-19.   Pt denies that she experienced the following symptoms:  Cough yes/no: No Fever (>100.39F)  yes/no: No Runny nose yes/no: No Sore throat yes/no: No Difficulty breathing/shortness of breath  yes/no: No  Have you or a family member traveled in the last 14 days and where? yes/no: No  Pt reminded that hospital visitation restrictions are in effect and the importance of the restrictions.   Pt verbalized understanding of all pre-op instructions.  Karoline Caldwell, PA, Anesthesiology, made aware of order for consult.

## 2018-10-16 ENCOUNTER — Encounter (HOSPITAL_COMMUNITY)
Admission: RE | Disposition: A | Discharge status: Home / Self Care | Payer: Self-pay | Source: Home / Self Care | Attending: Interventional Radiology

## 2018-10-16 ENCOUNTER — Ambulatory Visit (HOSPITAL_COMMUNITY): Payer: PPO | Admitting: Certified Registered Nurse Anesthetist

## 2018-10-16 ENCOUNTER — Other Ambulatory Visit: Payer: Self-pay

## 2018-10-16 ENCOUNTER — Ambulatory Visit (HOSPITAL_COMMUNITY)
Admission: RE | Admit: 2018-10-16 | Discharge: 2018-10-16 | Disposition: A | Discharge status: Home / Self Care | Payer: PPO | Source: Ambulatory Visit | Attending: Interventional Radiology | Admitting: Interventional Radiology

## 2018-10-16 ENCOUNTER — Ambulatory Visit (HOSPITAL_COMMUNITY)
Admission: RE | Admit: 2018-10-16 | Discharge: 2018-10-16 | Disposition: A | Discharge status: Home / Self Care | Payer: PPO | Attending: Interventional Radiology | Admitting: Interventional Radiology

## 2018-10-16 ENCOUNTER — Encounter (HOSPITAL_COMMUNITY): Payer: Self-pay

## 2018-10-16 DIAGNOSIS — I110 Hypertensive heart disease with heart failure: Secondary | ICD-10-CM | POA: Diagnosis not present

## 2018-10-16 DIAGNOSIS — Z8673 Personal history of transient ischemic attack (TIA), and cerebral infarction without residual deficits: Secondary | ICD-10-CM | POA: Diagnosis not present

## 2018-10-16 DIAGNOSIS — Z823 Family history of stroke: Secondary | ICD-10-CM | POA: Insufficient documentation

## 2018-10-16 DIAGNOSIS — R3915 Urgency of urination: Secondary | ICD-10-CM | POA: Insufficient documentation

## 2018-10-16 DIAGNOSIS — Z8249 Family history of ischemic heart disease and other diseases of the circulatory system: Secondary | ICD-10-CM | POA: Insufficient documentation

## 2018-10-16 DIAGNOSIS — Z96692 Finger-joint replacement of left hand: Secondary | ICD-10-CM | POA: Diagnosis not present

## 2018-10-16 DIAGNOSIS — Z79899 Other long term (current) drug therapy: Secondary | ICD-10-CM | POA: Diagnosis not present

## 2018-10-16 DIAGNOSIS — I6329 Cerebral infarction due to unspecified occlusion or stenosis of other precerebral arteries: Secondary | ICD-10-CM | POA: Diagnosis not present

## 2018-10-16 DIAGNOSIS — Z87891 Personal history of nicotine dependence: Secondary | ICD-10-CM | POA: Insufficient documentation

## 2018-10-16 DIAGNOSIS — Z7902 Long term (current) use of antithrombotics/antiplatelets: Secondary | ICD-10-CM | POA: Diagnosis not present

## 2018-10-16 DIAGNOSIS — Z95820 Peripheral vascular angioplasty status with implants and grafts: Secondary | ICD-10-CM | POA: Insufficient documentation

## 2018-10-16 DIAGNOSIS — I6522 Occlusion and stenosis of left carotid artery: Secondary | ICD-10-CM | POA: Diagnosis not present

## 2018-10-16 DIAGNOSIS — I471 Supraventricular tachycardia: Secondary | ICD-10-CM | POA: Diagnosis not present

## 2018-10-16 DIAGNOSIS — I5032 Chronic diastolic (congestive) heart failure: Secondary | ICD-10-CM | POA: Diagnosis not present

## 2018-10-16 DIAGNOSIS — Z888 Allergy status to other drugs, medicaments and biological substances status: Secondary | ICD-10-CM | POA: Diagnosis not present

## 2018-10-16 DIAGNOSIS — Z90710 Acquired absence of both cervix and uterus: Secondary | ICD-10-CM | POA: Diagnosis not present

## 2018-10-16 DIAGNOSIS — I2510 Atherosclerotic heart disease of native coronary artery without angina pectoris: Secondary | ICD-10-CM | POA: Insufficient documentation

## 2018-10-16 DIAGNOSIS — K589 Irritable bowel syndrome without diarrhea: Secondary | ICD-10-CM | POA: Diagnosis not present

## 2018-10-16 DIAGNOSIS — E785 Hyperlipidemia, unspecified: Secondary | ICD-10-CM | POA: Diagnosis not present

## 2018-10-16 DIAGNOSIS — I120 Hypertensive chronic kidney disease with stage 5 chronic kidney disease or end stage renal disease: Secondary | ICD-10-CM | POA: Diagnosis not present

## 2018-10-16 DIAGNOSIS — I771 Stricture of artery: Secondary | ICD-10-CM | POA: Insufficient documentation

## 2018-10-16 DIAGNOSIS — K219 Gastro-esophageal reflux disease without esophagitis: Secondary | ICD-10-CM | POA: Diagnosis not present

## 2018-10-16 DIAGNOSIS — Z7982 Long term (current) use of aspirin: Secondary | ICD-10-CM | POA: Diagnosis not present

## 2018-10-16 DIAGNOSIS — I658 Occlusion and stenosis of other precerebral arteries: Secondary | ICD-10-CM | POA: Diagnosis not present

## 2018-10-16 HISTORY — DX: Presence of spectacles and contact lenses: Z97.3

## 2018-10-16 HISTORY — PX: IR ANGIO VERTEBRAL SEL SUBCLAVIAN INNOMINATE UNI L MOD SED: IMG5364

## 2018-10-16 HISTORY — PX: IR ANGIO INTRA EXTRACRAN SEL COM CAROTID INNOMINATE BILAT MOD SED: IMG5360

## 2018-10-16 HISTORY — PX: IR ANGIO VERTEBRAL SEL VERTEBRAL UNI R MOD SED: IMG5368

## 2018-10-16 HISTORY — PX: RADIOLOGY WITH ANESTHESIA: SHX6223

## 2018-10-16 HISTORY — DX: Stricture of artery: I77.1

## 2018-10-16 HISTORY — DX: Presence of external hearing-aid: Z97.4

## 2018-10-16 LAB — PLATELET INHIBITION P2Y12: Platelet Function  P2Y12: 120 [PRU] — ABNORMAL LOW (ref 182–335)

## 2018-10-16 LAB — CBC WITH DIFFERENTIAL/PLATELET
Abs Immature Granulocytes: 0.01 10*3/uL (ref 0.00–0.07)
Basophils Absolute: 0 10*3/uL (ref 0.0–0.1)
Basophils Relative: 1 %
Eosinophils Absolute: 0.2 10*3/uL (ref 0.0–0.5)
Eosinophils Relative: 4 %
HCT: 34.9 % — ABNORMAL LOW (ref 36.0–46.0)
Hemoglobin: 11.4 g/dL — ABNORMAL LOW (ref 12.0–15.0)
Immature Granulocytes: 0 %
Lymphocytes Relative: 22 %
Lymphs Abs: 0.9 10*3/uL (ref 0.7–4.0)
MCH: 32.8 pg (ref 26.0–34.0)
MCHC: 32.7 g/dL (ref 30.0–36.0)
MCV: 100.3 fL — ABNORMAL HIGH (ref 80.0–100.0)
Monocytes Absolute: 0.4 10*3/uL (ref 0.1–1.0)
Monocytes Relative: 11 %
Neutro Abs: 2.6 10*3/uL (ref 1.7–7.7)
Neutrophils Relative %: 62 %
Platelets: 241 10*3/uL (ref 150–400)
RBC: 3.48 MIL/uL — ABNORMAL LOW (ref 3.87–5.11)
RDW: 12.3 % (ref 11.5–15.5)
WBC: 4.1 10*3/uL (ref 4.0–10.5)
nRBC: 0 % (ref 0.0–0.2)

## 2018-10-16 LAB — URINALYSIS, ROUTINE W REFLEX MICROSCOPIC
Bacteria, UA: NONE SEEN
Bilirubin Urine: NEGATIVE
Glucose, UA: NEGATIVE mg/dL
Hgb urine dipstick: NEGATIVE
Ketones, ur: NEGATIVE mg/dL
Nitrite: NEGATIVE
Protein, ur: NEGATIVE mg/dL
Specific Gravity, Urine: 1.021 (ref 1.005–1.030)
pH: 5 (ref 5.0–8.0)

## 2018-10-16 LAB — BASIC METABOLIC PANEL
Anion gap: 7 (ref 5–15)
BUN: 15 mg/dL (ref 8–23)
CO2: 26 mmol/L (ref 22–32)
Calcium: 9.5 mg/dL (ref 8.9–10.3)
Chloride: 107 mmol/L (ref 98–111)
Creatinine, Ser: 1.01 mg/dL — ABNORMAL HIGH (ref 0.44–1.00)
GFR calc Af Amer: >60 mL/min (ref >60)
GFR calc non Af Amer: 55 mL/min — ABNORMAL LOW (ref >60)
Glucose, Bld: 110 mg/dL — ABNORMAL HIGH (ref 70–99)
Potassium: 3.8 mmol/L (ref 3.5–5.1)
Sodium: 140 mmol/L (ref 135–145)

## 2018-10-16 LAB — APTT: aPTT: 31 seconds (ref 24–36)

## 2018-10-16 LAB — PROTIME-INR
INR: 1 (ref 0.8–1.2)
Prothrombin Time: 13 seconds (ref 11.4–15.2)

## 2018-10-16 SURGERY — IR WITH ANESTHESIA
Anesthesia: Monitor Anesthesia Care

## 2018-10-16 MED ORDER — HEPARIN SODIUM (PORCINE) 1000 UNIT/ML IJ SOLN
INTRAMUSCULAR | Status: DC | PRN
  Administered 2018-10-16: 1000 [IU] via INTRAVENOUS

## 2018-10-16 MED ORDER — CEFAZOLIN SODIUM-DEXTROSE 2-4 GM/100ML-% IV SOLN
2.0000 g | INTRAVENOUS | Status: DC
  Filled 2018-10-16: qty 100

## 2018-10-16 MED ORDER — LIDOCAINE HCL 1 % IJ SOLN
INTRAMUSCULAR | Status: AC | PRN
  Administered 2018-10-16: 10 mL

## 2018-10-16 MED ORDER — SODIUM CHLORIDE 0.9 % IV SOLN: INTRAVENOUS | Status: AC

## 2018-10-16 MED ORDER — ASPIRIN EC 325 MG PO TBEC
325.0000 mg | DELAYED_RELEASE_TABLET | ORAL | Status: DC
  Filled 2018-10-16: qty 1

## 2018-10-16 MED ORDER — FENTANYL CITRATE (PF) 100 MCG/2ML IJ SOLN: 25.0000 ug | INTRAMUSCULAR | Status: DC | PRN

## 2018-10-16 MED ORDER — LACTATED RINGERS IV SOLN
INTRAVENOUS | Status: DC | PRN
  Administered 2018-10-16: 09:00:00 via INTRAVENOUS

## 2018-10-16 MED ORDER — MIDAZOLAM HCL 5 MG/5ML IJ SOLN
INTRAMUSCULAR | Status: DC | PRN
  Administered 2018-10-16: 1 mg via INTRAVENOUS

## 2018-10-16 MED ORDER — CEFAZOLIN SODIUM-DEXTROSE 2-4 GM/100ML-% IV SOLN
INTRAVENOUS | Status: AC
  Filled 2018-10-16: qty 100

## 2018-10-16 MED ORDER — IOHEXOL 300 MG/ML  SOLN
300.0000 mL | Freq: Once | INTRAMUSCULAR | Status: AC | PRN
  Administered 2018-10-16: 90 mL via INTRA_ARTERIAL

## 2018-10-16 MED ORDER — CLOPIDOGREL BISULFATE 75 MG PO TABS
75.0000 mg | ORAL_TABLET | ORAL | Status: DC
  Filled 2018-10-16: qty 1

## 2018-10-16 MED ORDER — SODIUM CHLORIDE 0.9 % IV SOLN: INTRAVENOUS | Status: DC

## 2018-10-16 MED ORDER — LIDOCAINE HCL 1 % IJ SOLN
INTRAMUSCULAR | Status: AC
  Filled 2018-10-16: qty 20

## 2018-10-16 MED ORDER — NIMODIPINE 30 MG PO CAPS
0.0000 mg | ORAL_CAPSULE | ORAL | Status: DC
  Filled 2018-10-16: qty 2

## 2018-10-16 MED ORDER — MIDAZOLAM HCL 2 MG/2ML IJ SOLN
INTRAMUSCULAR | Status: AC
  Filled 2018-10-16: qty 2

## 2018-10-16 MED ORDER — FENTANYL CITRATE (PF) 100 MCG/2ML IJ SOLN
INTRAMUSCULAR | Status: DC | PRN
  Administered 2018-10-16: 25 ug via INTRAVENOUS

## 2018-10-16 MED ORDER — FENTANYL CITRATE (PF) 100 MCG/2ML IJ SOLN
INTRAMUSCULAR | Status: AC
  Filled 2018-10-16: qty 2

## 2018-10-16 NOTE — Sedation Documentation (Signed)
34fr exoseal to right femoral artery placed by IR Tech.  Right groin sheath pulled by IR tech, pressure held to groin site.

## 2018-10-16 NOTE — Discharge Instructions (Signed)
Femoral Site Care This sheet gives you information about how to care for yourself after your procedure. Your health care provider may also give you more specific instructions. If you have problems or questions, contact your health care provider. What can I expect after the procedure? After the procedure, it is common to have:  Bruising that usually fades within 1-2 weeks.  Tenderness at the site. Follow these instructions at home: Wound care  Follow instructions from your health care provider about how to take care of your insertion site. Make sure you: ? Wash your hands with soap and water before you change your bandage (dressing). If soap and water are not available, use hand sanitizer. ? Change your dressing as told by your health care provider. ? Leave stitches (sutures), skin glue, or adhesive strips in place. These skin closures may need to stay in place for 2 weeks or longer. If adhesive strip edges start to loosen and curl up, you may trim the loose edges. Do not remove adhesive strips completely unless your health care provider tells you to do that.  Do not take baths, swim, or use a hot tub until your health care provider approves.  You may shower 24-48 hours after the procedure or as told by your health care provider. ? Gently wash the site with plain soap and water. ? Pat the area dry with a clean towel. ? Do not rub the site. This may cause bleeding.  Do not apply powder or lotion to the site. Keep the site clean and dry.  Check your femoral site every day for signs of infection. Check for: ? Redness, swelling, or pain. ? Fluid or blood. ? Warmth. ? Pus or a bad smell. Activity  For the first 2-3 days after your procedure, or as long as directed: ? Avoid climbing stairs as much as possible. ? Do not squat.  Do not lift anything that is heavier than 10 lb (4.5 kg), or the limit that you are told, until your health care provider says that it is safe.  Rest as  directed. ? Avoid sitting for a long time without moving. Get up to take short walks every 1-2 hours.  Do not drive for 24 hours if you were given a medicine to help you relax (sedative). General instructions  Take over-the-counter and prescription medicines only as told by your health care provider.  Keep all follow-up visits as told by your health care provider. This is important. Contact a health care provider if you have:  A fever or chills.  You have redness, swelling, or pain around your insertion site. Get help right away if:  The catheter insertion area swells very fast.  You pass out.  You suddenly start to sweat or your skin gets clammy.  The catheter insertion area is bleeding, and the bleeding does not stop when you hold steady pressure on the area.  The area near or just beyond the catheter insertion site becomes pale, cool, tingly, or numb. These symptoms may represent a serious problem that is an emergency. Do not wait to see if the symptoms will go away. Get medical help right away. Call your local emergency services (911 in the [place] Do not drive yourself to the hospital. Summary  After the procedure, it is common to have bruising that usually fades within 1-2 weeks.  Check your femoral site every day for signs of infection.  Do not lift anything that is heavier than 10 lb (4.5  kg), or the limit that you are told, until your health care provider says that it is safe. This information is not intended to replace advice given to you by your health care provider. Make sure you discuss any questions you have with your health care provider. Document Released: 12/05/2013 Document Revised: 04/16/2017 Document Reviewed: 04/16/2017 Elsevier Patient Education  2020 [place] Anesthesia, Adult, Care After This sheet gives you information about how to care for yourself after your procedure. Your health care provider may also give you more specific  instructions. If you have problems or questions, contact your health care provider. What can I expect after the procedure? After the procedure, the following side effects are common:  Pain or discomfort at the IV site.  Nausea.  Vomiting.  Sore throat.  Trouble concentrating.  Feeling cold or chills.  Weak or tired.  Sleepiness and fatigue.  Soreness and body aches. These side effects can affect parts of the body that were not involved in surgery. Follow these instructions at home:  For at least 24 hours after the procedure:  Have a responsible adult stay with you. It is important to have someone help care for you until you are awake and alert.  Rest as needed.  Do not: ? Participate in activities in which you could fall or become injured. ? Drive. ? Use heavy machinery. ? Drink alcohol. ? Take sleeping pills or medicines that cause drowsiness. ? Make important decisions or sign legal documents. ? Take care of children on your own. Eating and drinking  Follow any instructions from your health care provider about eating or drinking restrictions.  When you feel hungry, start by eating small amounts of foods that are soft and easy to digest (bland), such as toast. Gradually return to your regular diet.  Drink enough fluid to keep your urine pale yellow.  If you vomit, rehydrate by drinking water, juice, or clear broth. General instructions  If you have sleep apnea, surgery and certain medicines can increase your risk for breathing problems. Follow instructions from your health care provider about wearing your sleep device: ? Anytime you are sleeping, including during daytime naps. ? While taking prescription pain medicines, sleeping medicines, or medicines that make you drowsy.  Return to your normal activities as told by your health care provider. Ask your health care provider what activities are safe for you.  Take over-the-counter and prescription medicines only  as told by your health care provider.  If you smoke, do not smoke without supervision.  Keep all follow-up visits as told by your health care provider. This is important. Contact a health care provider if:  You have nausea or vomiting that does not get better with medicine.  You cannot eat or drink without vomiting.  You have pain that does not get better with medicine.  You are unable to pass urine.  You develop a skin rash.  You have a fever.  You have redness around your IV site that gets worse. Get help right away if:  You have difficulty breathing.  You have chest pain.  You have blood in your urine or stool, or you vomit blood. Summary  After the procedure, it is common to have a sore throat or nausea. It is also common to feel tired.  Have a responsible adult stay with you for the first 24 hours after general anesthesia. It is important to have someone help care for you until you are awake and alert.  When you feel hungry, start by eating small amounts of foods that are soft and easy to digest (bland), such as toast. Gradually return to your regular diet.  Drink enough fluid to keep your urine pale yellow.  Return to your normal activities as told by your health care provider. Ask your health care provider what activities are safe for you. This information is not intended to replace advice given to you by your health care provider. Make sure you discuss any questions you have with your health care provider. Document Released: 07/10/2000 Document Revised: 04/06/2017 Document Reviewed: 11/17/2016 Elsevier Patient Education  2020 Elsevier   [employer] as seen on TV

## 2018-10-16 NOTE — Anesthesia Procedure Notes (Signed)
Procedure Name: MAC Date/Time: 10/16/2018 8:55 AM Performed by: Candis Shine, CRNA Pre-anesthesia Checklist: Patient identified, Emergency Drugs available, Suction available and Patient being monitored Patient Re-evaluated:Patient Re-evaluated prior to induction Oxygen Delivery Method: Nasal cannula Dental Injury: Teeth and Oropharynx as per pre-operative assessment

## 2018-10-16 NOTE — Sedation Documentation (Signed)
No urinary catheter placement per Dr. Arlean Hopping verbal order

## 2018-10-16 NOTE — Progress Notes (Signed)
Up and walked and tolerated well; right groin stable no bleeding or hematoma 

## 2018-10-16 NOTE — Progress Notes (Signed)
[facility].  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 Estanislado Pandy.  However, procedure was stopped after diagnostic cerebral arteriogram due to lack of severity of stenosis (<70% stenotic). Recommended F/U per Dr. Estanislado Pandy- 3 month US carotids bilaterally. Message sent to schedulers to set up this imaging scan.  The above was discussed between Dr. Estanislado Pandy and patient. All questions answered and concerns addressed.  Please call [facility] with questions/concerns.   Bea Graff Louk, PA-C 10/16/2018, 11:36 AM

## 2018-10-16 NOTE — Anesthesia Postprocedure Evaluation (Signed)
Anesthesia Post Note  Patient: Susan Gillespie  Procedure(s) Performed: STENTING (N/A )     Patient location during evaluation: PACU Anesthesia Type: MAC Level of consciousness: awake Pain management: pain level controlled Vital Signs Assessment: post-procedure vital signs reviewed and stable Cardiovascular status: stable Postop Assessment: no apparent nausea or vomiting Anesthetic complications: no    Last Vitals:  Vitals:   10/16/18 1154 10/16/18 1209  BP: 127/60 (!) 127/59  Pulse: (!) 58 60  Resp: 13 11  Temp:    SpO2: 98% 98%    Last Pain:  Vitals:   10/16/18 1200  TempSrc:   PainSc: 0-No pain                 Ezariah Nace

## 2018-10-16 NOTE — Transfer of Care (Signed)
Immediate Anesthesia Transfer of Care Note  Patient: Susan Gillespie  Procedure(s) Performed: STENTING (N/A )  Patient Location: PACU  Anesthesia Type:MAC  Level of Consciousness: awake, alert  and oriented  Airway & Oxygen Therapy: Patient Spontanous Breathing  Post-op Assessment: Report given to RN and Post -op Vital signs reviewed and stable  Post vital signs: Reviewed and stable  Last Vitals:  Vitals Value Taken Time  BP 132/59 10/16/18 1024  Temp    Pulse 56 10/16/18 1026  Resp 18 10/16/18 1026  SpO2 100 % 10/16/18 1026  Vitals shown include unvalidated device data.  Last Pain:  Vitals:   10/16/18 0734  TempSrc:   PainSc: 0-No pain         Complications: No apparent anesthesia complications

## 2018-10-16 NOTE — H&P (Signed)
Chief Complaint: Patient was seen in consultation today for left ICA stenosis and left subclavian artery stenosis/endovascular revascularization.  Referring Physician(s): Garvin Fila  Supervising Physician: Luanne Bras  Patient Status: Susan Gillespie - Out-pt  History of Present Illness: Susan Gillespie is a 73 y.o. female with a past medical history of hypertension, hyperlipidemia, SVT, chronic diastolic HF, CAD, CVA 08/3612, migraines, melanoma, and anxiety. She is known to [facility] and has been followed by Dr. Estanislado Pandy since 09/2014. She first presented to our department at the request of Dr. Leonie Man for management of vertebrobasilar insufficiency/right vertebral artery stenosis/subclavian steal syndrome. She first consulted with Dr. Estanislado Pandy 10/13/2014 to discuss management options. She underwent an image-guided cerebral arteriogram with revascularization of her left subclavian artery stenosis using stent assisted angioplasty 11/09/2014 by Dr. Estanislado Pandy. She was followed with routine imaging scans to monitor for changes and was unfortunately found to have intra-stent stenosis. She then underwent an image-guided cerebral arteriogram with revascularization of left subclavian artery intra-stent stenosis using balloon angioplasty 01/05/2016 by Dr. Estanislado Pandy. She has since been followed with routine imaging scans to monitor for changes. At her most recent follow-up imaging (US carotids 06/12/2018), she was found to have left ICA stenosis and left subclavian artery stenosis. She consulted with Dr. Estanislado Pandy 06/25/2018 to discuss these findings and management options. At that time, patient decided to pursue endovascular revascularization for her left ICA/left subclavian artery stenosis.  US carotids 06/12/2018: 1. Right Carotid: Velocities in the right ICA are consistent with a 1-39% stenosis. 2. Left Carotid: Velocities in the left ICA are consistent with a 40-59% stenosis. The ECA appears >50% stenosed. 3.  Vertebrals: Right vertebral artery demonstrates antegrade flow. Left vertebral artery demonstrates retrograde flow. 4. Subclavians: Left subclavian artery was stenotic. Normal flow hemodynamics wereseen in the right subclavian artery.  Patient presents today for possible image-guided cerebral arteriogram with possible revascularization (angioplasty/stent placement) of left ICA stenosis versus left subclavian artery stenosis. Patient awake and alert laying in bed. Complains of episodes of intermittent dizziness. States that this occurs when she is laying on bed and turns on her right side. States that it goes away when she turns on her left side. Denies syncope associated with dizziness. Denies headaches, weakness, numbness/tingling, vision changes, hearing changes, tinnitus, or speech difficulty.  Patient is currently taking Plavix 75 mg once daily and Aspirin 325 mg once daily.   Past Medical History:  Diagnosis Date  . Anxiety   . Artery stenosis (HCC)    left internal carotid, left subclavian  . Arthritis   . Bleeding tendency ([place]) 05/16/2012   Hx post op bleeding (epidural hematoma s/p ACDF '10; LLQ hematoma due to superficial vessel fascial bleed post colon resection; Normal platelet count; saw Dr. [name] '14  . CAD in native artery    a. mod to mod-severe CAD by cath 10/2016, med rx.  . Carotid stenosis, bilateral 08/31/2016   1-39% right and 40-59% left ICA stenosis by dopplers 05/2016  . Chronic diastolic CHF (congestive heart failure) ([place])   . Colon polyp   . Diverticulitis   . Family history of cardiovascular disease   . Fracture of right fibula   . GERD (gastroesophageal reflux disease)   . Headache(784.0)    mirgraines - history of  . Hearing aid worn    B/L  . HOH (hard of hearing)    wears hearing aids  . Hyperlipidemia   . Hypertension   . IBS (irritable bowel syndrome)    followed by Dr.  Medoff  . Liver cyst   . Melanoma ([place])    removed from back 04/2013   . Shortness of breath dyspnea    with exertion  . Stroke ([place]) 06/2012   mild  . SVT (supraventricular tachycardia) ([place])   . Urinary urgency   . UTI (lower urinary tract infection)    x 2 since June 2016  . Wears glasses     Past Surgical History:  Procedure Laterality Date  . ABDOMINAL HYSTERECTOMY    . ANGIOPLASTY     stent in subclavian LEFT 2016, angioplasty of stent 2017  . APPENDECTOMY    . AUGMENTATION MAMMAPLASTY    . BREAST EXCISIONAL BIOPSY Left    benign  . BREAST SURGERY     BIL breast augmentation  . CHOLECYSTECTOMY    . COLON SURGERY     Sigmoid Colectomy, returned for post op bleeding  . COLONOSCOPY W/ POLYPECTOMY    . COLONOSCOPY WITH PROPOFOL N/A 05/05/2016   Procedure: COLONOSCOPY WITH PROPOFOL;  Surgeon: Alphonsa Overall, MD;  Location: [facility];  Service: General;  Laterality: N/A;  . ESOPHAGOGASTRODUODENOSCOPY (EGD) WITH PROPOFOL N/A 05/05/2016   Procedure: ESOPHAGOGASTRODUODENOSCOPY (EGD) WITH PROPOFOL;  Surgeon: Alphonsa Overall, MD;  Location: [facility];  Service: General;  Laterality: N/A;  . EYE SURGERY     eyelid drooping fixed  . IR GENERIC HISTORICAL  12/24/2015   IR ANGIO INTRA EXTRACRAN SEL COM CAROTID INNOMINATE BILAT MOD SED 12/24/2015 Luanne Bras, MD [facility]  . IR GENERIC HISTORICAL  12/24/2015   IR ANGIOGRAM EXTREMITY LEFT 12/24/2015 Luanne Bras, MD [facility]  . IR GENERIC HISTORICAL  12/24/2015   IR ANGIO VERTEBRAL SEL VERTEBRAL UNI R MOD SED 12/24/2015 Luanne Bras, MD [facility]  . IR GENERIC HISTORICAL  01/05/2016   IR PTA NON CORO-LOWER EXTREM 01/05/2016 Luanne Bras, MD [facility]  . IR GENERIC HISTORICAL  03/07/2016   IR RADIOLOGIST EVAL & MGMT 03/07/2016 [facility]  . JOINT REPLACEMENT Left    thumb  . LEFT HEART CATH AND CORONARY ANGIOGRAPHY N/A 11/10/2016   Procedure: Left Heart Cath and Coronary Angiography;  Surgeon: Belva Crome, MD;  Location: [place] [facility];  Service: Cardiovascular;   Laterality: N/A;  . MASTOID DEBRIDEMENT    . MASTOIDECTOMY REVISION    . RADIOLOGY WITH ANESTHESIA N/A 11/09/2014   Procedure: STENT PLACEMENT;  Surgeon: Luanne Bras, MD;  Location: [place];  Service: Radiology;  Laterality: N/A;  . RADIOLOGY WITH ANESTHESIA N/A 05/31/2015   Procedure: RADIOLOGY WITH ANESTHESIA;  Surgeon: Luanne Bras, MD;  Location: [place];  Service: Radiology;  Laterality: N/A;  . RADIOLOGY WITH ANESTHESIA N/A 01/05/2016   Procedure: RADIOLOGY WITH ANESTHESIA ANGIOPLASTY WITH STENTNG;  Surgeon: Luanne Bras, MD;  Location: [place];  Service: Radiology;  Laterality: N/A;  . skin cancer excised  04/2013   Melonoma  . SPINE SURGERY     cervical fusion, returned to OR for post op  bleeding  . TONSILLECTOMY    . TONSILLECTOMY    . TUBAL LIGATION    . VAGINA SURGERY     anterior posterior repair    Allergies: Phenergan [promethazine hcl], Daypro [oxaprozin], Lisinopril, Other, and Simvastatin  Medications: Prior to Admission medications   Medication Sig Start Date End Date Taking? Authorizing Provider  acetaminophen (TYLENOL) 650 MG CR tablet Take 1,300 mg by mouth every 8 (eight) hours as needed for pain.    Yes [provider]  ALPRAZolam Duanne Moron) 0.5 MG tablet TAKE 0.5  TABLETS (0.25 MG TOTAL) BY MOUTH AT BEDTIME AS NEEDED FOR ANXIETY OR SLEEP. 06/06/18  Yes Biagio Borg, MD  aspirin 325 MG tablet Take 1 tablet (325 mg total) by mouth daily. 11/10/14  Yes Tsosie Billing D, PA-C  celecoxib (CELEBREX) 200 MG capsule Take 200 mg by mouth daily as needed for mild pain.   Yes [provider]  cetirizine (ZYRTEC) 10 MG tablet Take 10 mg by mouth every morning.    Yes [provider]  clopidogrel (PLAVIX) 75 MG tablet TAKE 1 TABLET BY MOUTH EVERY DAY 04/26/18  Yes Burns, Claudina Lick, MD  diltiazem (CARDIZEM CD) 180 MG 24 hr capsule TAKE 1 CAPSULE BY MOUTH EVERY DAY 05/01/18  Yes Camnitz, Will Hassell Done, MD  famotidine (PEPCID) 40 MG tablet TAKE 1 TABLET  BY MOUTH EVERYDAY AT BEDTIME Patient taking differently: Take 40 mg by mouth at bedtime.  07/22/18  Yes Burns, Claudina Lick, MD  ferrous sulfate 325 (65 FE) MG tablet Take 325 mg by mouth daily with breakfast.   Yes [provider]  furosemide (LASIX) 20 MG tablet TAKE 1 TABLET BY MOUTH EVERY DAY 04/29/18  Yes Dunn, Dayna N, PA-C  loperamide (IMODIUM A-D) 2 MG tablet Take 2 mg by mouth daily.   Yes [provider]  losartan (COZAAR) 50 MG tablet Take 50 mg by mouth daily.   Yes [provider]  Magnesium 250 MG TABS Take 250 mg by mouth daily.   Yes [provider]  multivitamin-lutein (OCUVITE-LUTEIN) CAPS capsule Take 1 capsule by mouth daily.   Yes [provider]  nitroGLYCERIN (NITROSTAT) 0.4 MG SL tablet Place 1 tablet (0.4 mg total) under the tongue every 5 (five) minutes as needed for chest pain. 11/10/16 10/10/18 Yes Eileen Stanford, PA-C  potassium chloride (K-DUR) 10 MEQ tablet TAKE 1 TABLET BY MOUTH EVERY DAY 07/22/18  Yes Burns, Claudina Lick, MD  rosuvastatin (CRESTOR) 20 MG tablet TAKE 1 TABLET BY MOUTH EVERYDAY AT BEDTIME Patient taking differently: Take 20 mg by mouth at bedtime.  07/22/18  Yes Burns, Claudina Lick, MD  traZODone (DESYREL) 50 MG tablet TAKE 1 TABLET BY MOUTH EVERYDAY AT BEDTIME 07/21/18  Yes Burns, Claudina Lick, MD  vitamin B-12 (CYANOCOBALAMIN) 1000 MCG tablet Take 1,000 mcg by mouth daily.   Yes [provider]  metoprolol tartrate (LOPRESSOR) 50 MG tablet Take 50 mg by mouth daily as needed (SVT symptoms).    [provider]  SUMAtriptan (IMITREX) 50 MG tablet Take 50 mg by mouth every 2 (two) hours as needed for migraine. May repeat in 2 hours if headache persists or recurs.    [provider]     Family History  Problem Relation Age of Onset  . Hyperlipidemia Other   . Hypertension Other   . Cancer Other        lung, esophagus, stomach  . Stroke Other   . Heart disease Father   . Heart disease Sister   .  Heart attack Sister   . Heart disease Brother   . Heart attack Brother   . Stroke Son     Social History   Socioeconomic History  . Marital status: Divorced    Spouse name: Not on file  . Number of children: 2  . Years of education: 41  . Highest education level: Not on file  Occupational History  . 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 Financial resource strain: Not on file  . Food insecurity    Worry: Not on file    Inability: Not on file  . Transportation needs    Medical: Not on file    Non-medical: Not on file  Tobacco Use  . Smoking status: Former Smoker    Packs/day: 2.00    Years: 22.00    Pack years: 44.00    Types: Cigarettes    Quit date: 04/23/1985    Years since quitting: 33.5  . Smokeless tobacco: Never Used  Substance and Sexual Activity  . Alcohol use: Yes    Alcohol/week: 4.0 standard drinks    Types: 4 Glasses of wine per week    Comment: 1 drink a  day  . Drug use: No  . Sexual activity: Yes    Birth control/protection: Surgical  Lifestyle  . Physical activity    Days per week: Not on file    Minutes per session: Not on file  . Stress: Not on file  Relationships  . Social Herbalist on phone: Not on file    Gets together: Not on file    Attends religious service: Not on file    Active member of club or organization: Not on file    Attends meetings of clubs or organizations: Not on file    Relationship status: Not on file  Other Topics Concern  . Not on file  Social History Narrative   ** Merged History Encounter **       Domestic partner   Regular exercise-no   Right handed   Caffeine use-- 2 cups coffee daily, rare soda     Review of Systems: A 12 point ROS discussed and pertinent positives are indicated in the HPI above.  All other systems are negative.  Review of Systems  Constitutional: Negative for chills and fever.  HENT: Negative for  hearing loss and tinnitus.   Eyes: Negative for visual disturbance.  Respiratory: Negative for shortness of breath and wheezing.   Cardiovascular: Negative for chest pain and palpitations.  Neurological: Positive for dizziness. Negative for syncope, speech difficulty, weakness, numbness and headaches.  Psychiatric/Behavioral: Negative for behavioral problems and confusion.    Vital Signs: BP (!) 126/38   Pulse (!) 59   Temp 97.9 F (36.6 C) (Oral)   Resp 18   Ht 5\' 3"  (1.6 m)   Wt 156 lb (70.8 kg)   LMP  (LMP Unknown)   SpO2 97%   BMI 27.63 kg/m   Physical Exam Vitals signs and nursing note reviewed.  Constitutional:      General: She is not in acute distress.    Appearance: Normal appearance.  Cardiovascular:     Rate and Rhythm: Normal rate and regular rhythm.     Heart sounds: Normal heart sounds. No murmur.  Pulmonary:     Effort: Pulmonary effort is normal. No respiratory distress.     Breath sounds: Normal breath sounds. No wheezing.  Skin:    General: Skin is warm and dry.  Neurological:     Mental Status: She is alert and oriented to person, place, and time.  Psychiatric:        Mood and Affect: Mood normal.        Behavior: Behavior normal.        Thought Content: Thought content normal.        Judgment: Judgment normal.      MD Evaluation Airway: WNL Heart:  WNL Abdomen: WNL Chest/ Lungs: WNL ASA  Classification: 3 Mallampati/Airway Score: Two   Imaging: Ct Angio Head W Or Wo Contrast  Result Date: 09/30/2018 CLINICAL DATA:  73 y/o  F; EXAM: CT ANGIOGRAPHY HEAD AND NECK TECHNIQUE: Multidetector CT imaging of the head and neck was performed using the standard protocol during bolus administration of intravenous contrast. Multiplanar CT image reconstructions and MIPs were obtained to evaluate the vascular anatomy. Carotid stenosis measurements (when applicable) are obtained utilizing NASCET criteria, using the distal internal carotid diameter as the  denominator. CONTRAST:  23mL OMNIPAQUE IOHEXOL 350 MG/ML SOLN COMPARISON:  01/05/2016 angiogram. 09/23/2015 CT angiogram head and neck. FINDINGS: CT HEAD FINDINGS Brain: No evidence of acute infarction, hemorrhage, hydrocephalus, extra-axial collection or mass lesion/mass effect. Few nonspecific white matter hypodensities are compatible with mild chronic microvascular ischemic changes and there is mild volume loss of the brain. Vascular: As below. Skull: Normal. Negative for fracture or focal lesion. Sinuses: Imaged portions are clear. Orbits: No acute finding. Review of the MIP images confirms the above findings CTA NECK FINDINGS Aortic arch: Standard branching. Imaged portion shows no evidence of aneurysm or dissection. Aortic calcific atherosclerosis. Left subclavian artery stent. Suboptimal assessment of stent stenosis due to beam hardening artifact and atherosclerotic calcification. Right carotid system: No evidence of dissection, stenosis (50% or greater) or occlusion. Moderate calcified plaque of the right carotid bifurcation without significant stenosis. Left carotid system: 80% left proximal ICA stenosis with dense calcified plaque. No dissection or occlusion. Vertebral arteries: Codominant. No evidence of dissection, stenosis (50% or greater) or occlusion. The left V1 and V4 segments are avidly enhancing and there is hypoenhancement of the left V2 and V3 segments suggesting in flow from both the basilar and subclavian arteries and overall slow flow in the system. Skeleton: C5-C7 ACDF chronic postsurgical changes. C2-3 and C3-4 minimal grade 1 anterolisthesis. Advanced discogenic degenerative changes at C4-5. Other neck: Negative. Upper chest: Mild centrilobular emphysema. Aortic calcific atherosclerosis. Bilateral breast prostheses, partially visualized. Review of the MIP images confirms the above findings CTA HEAD FINDINGS Anterior circulation: No significant stenosis, proximal occlusion, aneurysm, or  vascular malformation. Mild non stenotic calcific atherosclerosis of the carotid siphons. Posterior circulation: No significant stenosis, proximal occlusion, aneurysm, or vascular malformation. Venous sinuses: As permitted by contrast timing, patent. Anatomic variants: Complete circle-of-Willis. Delayed phase: No abnormal intracranial enhancement. Review of the MIP images confirms the above findings IMPRESSION: 1. Left subclavian artery stent. Suboptimal assessment of stent stenosis due to beam hardening artifact and atherosclerotic calcification. 2. Avid enhancement of left vertebral artery V1 and V4 segments and hypoenhancement of V2 and V3 segments indicating a degree of inflow from both the basilar and subclavian arteries with overall slow flow in the system. 3. 80% left proximal ICA stenosis with dense calcific plaque. 4. No hemodynamically significant stenosis of the right carotid system. 5. Patent anterior and posterior intracranial circulation. No large vessel occlusion, aneurysm, or significant stenosis. 6. Negative stable noncontrast CT of the head. Electronically Signed   By: Kristine Garbe M.D.   On: 09/30/2018 22:53   Ct Angio Neck W Or Wo Contrast  Result Date: 09/30/2018 CLINICAL DATA:  73 y/o  F; EXAM: CT ANGIOGRAPHY HEAD AND NECK TECHNIQUE: Multidetector CT imaging of the head and neck was performed using the standard protocol during bolus administration of intravenous contrast. Multiplanar CT image reconstructions and MIPs were obtained to evaluate the vascular anatomy. Carotid stenosis measurements (when applicable) are obtained utilizing NASCET criteria, using the distal internal  carotid diameter as the denominator. CONTRAST:  14mL OMNIPAQUE IOHEXOL 350 MG/ML SOLN COMPARISON:  01/05/2016 angiogram. 09/23/2015 CT angiogram head and neck. FINDINGS: CT HEAD FINDINGS Brain: No evidence of acute infarction, hemorrhage, hydrocephalus, extra-axial collection or mass lesion/mass effect. Few  nonspecific white matter hypodensities are compatible with mild chronic microvascular ischemic changes and there is mild volume loss of the brain. Vascular: As below. Skull: Normal. Negative for fracture or focal lesion. Sinuses: Imaged portions are clear. Orbits: No acute finding. Review of the MIP images confirms the above findings CTA NECK FINDINGS Aortic arch: Standard branching. Imaged portion shows no evidence of aneurysm or dissection. Aortic calcific atherosclerosis. Left subclavian artery stent. Suboptimal assessment of stent stenosis due to beam hardening artifact and atherosclerotic calcification. Right carotid system: No evidence of dissection, stenosis (50% or greater) or occlusion. Moderate calcified plaque of the right carotid bifurcation without significant stenosis. Left carotid system: 80% left proximal ICA stenosis with dense calcified plaque. No dissection or occlusion. Vertebral arteries: Codominant. No evidence of dissection, stenosis (50% or greater) or occlusion. The left V1 and V4 segments are avidly enhancing and there is hypoenhancement of the left V2 and V3 segments suggesting in flow from both the basilar and subclavian arteries and overall slow flow in the system. Skeleton: C5-C7 ACDF chronic postsurgical changes. C2-3 and C3-4 minimal grade 1 anterolisthesis. Advanced discogenic degenerative changes at C4-5. Other neck: Negative. Upper chest: Mild centrilobular emphysema. Aortic calcific atherosclerosis. Bilateral breast prostheses, partially visualized. Review of the MIP images confirms the above findings CTA HEAD FINDINGS Anterior circulation: No significant stenosis, proximal occlusion, aneurysm, or vascular malformation. Mild non stenotic calcific atherosclerosis of the carotid siphons. Posterior circulation: No significant stenosis, proximal occlusion, aneurysm, or vascular malformation. Venous sinuses: As permitted by contrast timing, patent. Anatomic variants: Complete  circle-of-Willis. Delayed phase: No abnormal intracranial enhancement. Review of the MIP images confirms the above findings IMPRESSION: 1. Left subclavian artery stent. Suboptimal assessment of stent stenosis due to beam hardening artifact and atherosclerotic calcification. 2. Avid enhancement of left vertebral artery V1 and V4 segments and hypoenhancement of V2 and V3 segments indicating a degree of inflow from both the basilar and subclavian arteries with overall slow flow in the system. 3. 80% left proximal ICA stenosis with dense calcific plaque. 4. No hemodynamically significant stenosis of the right carotid system. 5. Patent anterior and posterior intracranial circulation. No large vessel occlusion, aneurysm, or significant stenosis. 6. Negative stable noncontrast CT of the head. Electronically Signed   By: Kristine Garbe M.D.   On: 09/30/2018 22:53    Labs:  CBC: Recent Labs    04/05/18 1051 10/16/18 0647  WBC 5.2 4.1  HGB 12.2 11.4*  HCT 35.8* 34.9*  PLT 270.0 241    COAGS: Recent Labs    10/16/18 0647  INR 1.0  APTT 31    BMP: Recent Labs    04/05/18 1051 09/30/18 1522 10/16/18 0647  NA 138  --  140  K 4.0  --  3.8  CL 103  --  107  CO2 28  --  26  GLUCOSE 109*  --  110*  BUN 16  --  15  CALCIUM 9.6  --  9.5  CREATININE 0.95 1.00 1.01*  GFRNONAA  --   --  55*  GFRAA  --   --  >60    LIVER FUNCTION TESTS: Recent Labs    04/05/18 1051  BILITOT 0.3  AST 16  ALT 12  ALKPHOS 38*  PROT  6.8  ALBUMIN 4.3     Assessment and Plan:  Left ICA stenosis and left subclavian artery stenosis. Plan for image-guided cerebral arteriogram with possible revascularization (angioplasty/stent placement) of left ICA stenosis versus left subclavian artery stenosis today with Dr. Estanislado Pandy. Patient is NPO. Afebrile and WBCs WNL. Ok to proceed with Plavix/Aspirin use. INR 1.0 today. P2Y12 120 PRU this AM.  Risks and benefits of cerebral arteriogram with  intervention were discussed with the patient including, but not limited to bleeding, infection, vascular injury, contrast induced renal failure, stroke or even death. This interventional procedure involves the use of X-rays and because of the nature of the planned procedure, it is possible that we will have prolonged use of X-ray fluoroscopy. Potential radiation risks to you include (but are not limited to) the following: - A slightly elevated risk for cancer  several years later in life. This risk is typically less than 0.5% percent. This risk is low in comparison to the normal incidence of human cancer, which is 33% for women and 50% for men according to the [place]. - Radiation induced injury can include skin redness, resembling a rash, tissue breakdown / ulcers and hair loss (which can be temporary or permanent).  The likelihood of either of these occurring depends on the difficulty of the procedure and whether you are sensitive to radiation due to previous procedures, disease, or genetic conditions.  IF your procedure requires a prolonged use of radiation, you will be notified and given written instructions for further action.  It is your responsibility to monitor the irradiated area for the 2 weeks following the procedure and to notify your physician if you are concerned that you have suffered a radiation induced injury.   All of the patient's questions were answered, patient is agreeable to proceed. Consent signed and in chart.   Thank you for this interesting consult.  I greatly enjoyed meeting Nomi Rudnicki Donati and look forward to participating in their care.  A copy of this report was sent to the requesting provider on this date.  Electronically Signed: Earley Abide, PA-C 10/16/2018, 8:41 AM   I spent a total of 40 Minutes in face to face in clinical consultation, greater than 50% of which was counseling/coordinating care for left ICA stenosis and left subclavian artery  stenosis/endovascular revascularization.

## 2018-10-16 NOTE — Sedation Documentation (Addendum)
Report given to Rachael Darby (PACU).  RLE and right groin site assessed with Arnette Norris

## 2018-10-16 NOTE — Procedures (Signed)
S/P 4 vessel cerebral arteriogram. RT CFA approach. Findings. 1.Approx 60 %  intrastent  stenosis  of prox LT subclavian with  no subclavian steal. 2.Approx 50 % stenosis of Lt ICA prox. 3. Bilateral Pcoms.Marland Kitchen S.Daemian Gahm MD

## 2018-10-17 ENCOUNTER — Other Ambulatory Visit: Payer: Self-pay | Admitting: Internal Medicine

## 2018-10-17 ENCOUNTER — Encounter (HOSPITAL_COMMUNITY): Payer: Self-pay | Admitting: Interventional Radiology

## 2018-10-19 ENCOUNTER — Other Ambulatory Visit: Payer: Self-pay | Admitting: Internal Medicine

## 2018-10-21 ENCOUNTER — Other Ambulatory Visit: Payer: Self-pay | Admitting: Internal Medicine

## 2018-10-21 DIAGNOSIS — Z1231 Encounter for screening mammogram for malignant neoplasm of breast: Secondary | ICD-10-CM

## 2018-10-22 ENCOUNTER — Encounter (HOSPITAL_COMMUNITY): Payer: Self-pay | Admitting: Interventional Radiology

## 2018-10-23 ENCOUNTER — Encounter (HOSPITAL_COMMUNITY): Payer: Self-pay

## 2018-10-29 NOTE — Progress Notes (Signed)
Subjective:    Patient ID: Susan Gillespie, female    DOB: 1945/10/03, 73 y.o.   MRN: [id number]  HPI She is here for a physical exam.     Medications and allergies reviewed with patient and updated if appropriate.  Patient Active Problem List   Diagnosis Date Noted  . Post menopausal syndrome 10/09/2017  . Migraine without status migrainosus, not intractable 10/09/2017  . Spondylosis of cervical region without myelopathy or radiculopathy 10/09/2017  . Insomnia 10/09/2017  . IBS (irritable bowel syndrome) 10/09/2017  . Chronic diastolic CHF (congestive heart failure), NYHA class 1 ([place]) 02/20/2017  . CAD in native artery 11/10/2016  . Carotid stenosis, bilateral 08/31/2016  . SOB (shortness of breath) 08/31/2016  . 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[place]) 01/05/2016  . Pernicious anemia 09/15/2013  . B12 deficiency anemia 09/15/2013  . Situational anxiety 12/20/2012  . Spinal stenosis of lumbar region 07/05/2012  . Cerebral infarction ([place]) 06/28/2012  . Bleeding tendency ([place]) 05/16/2012  . Paroxysmal supraventricular tachycardia ([place]) 09/25/2011  . Essential hypertension, benign 08/22/2010  . Prediabetes 04/28/2009  . Hyperlipidemia LDL goal <70 12/16/2008  . GERD 12/16/2008  . Osteoarthritis 12/16/2008    Current Outpatient Medications on File Prior to Visit  Medication Sig Dispense Refill  . acetaminophen (TYLENOL) 650 MG CR tablet Take 1,300 mg by mouth every 8 (eight) hours as needed for pain.     Marland Kitchen ALPRAZolam (XANAX) 0.5 MG tablet TAKE 0.5 TABLETS (0.25 MG TOTAL) BY MOUTH AT BEDTIME AS NEEDED FOR ANXIETY OR SLEEP. 30 tablet 0  . aspirin 325 MG tablet Take 1 tablet (325 mg total) by mouth daily. 30 tablet 0  . celecoxib (CELEBREX) 200 MG capsule Take 200 mg by mouth daily as needed for mild pain.    . cetirizine (ZYRTEC) 10 MG tablet Take 10 mg by mouth every morning.     . clopidogrel (PLAVIX) 75 MG tablet TAKE 1 TABLET BY MOUTH EVERY DAY 30 tablet 1  . diltiazem  (CARDIZEM CD) 180 MG 24 hr capsule TAKE 1 CAPSULE BY MOUTH EVERY DAY 90 capsule 3  . famotidine (PEPCID) 40 MG tablet TAKE 1 TABLET BY MOUTH EVERYDAY AT BEDTIME (Patient taking differently: Take 40 mg by mouth at bedtime. ) 90 tablet 1  . ferrous sulfate 325 (65 FE) MG tablet Take 325 mg by mouth daily with breakfast.    . furosemide (LASIX) 20 MG tablet TAKE 1 TABLET BY MOUTH EVERY DAY 90 tablet 3  . loperamide (IMODIUM A-D) 2 MG tablet Take 2 mg by mouth daily.    Marland Kitchen losartan (COZAAR) 50 MG tablet TAKE 1 TABLET BY MOUTH EVERY DAY 90 tablet 0  . Magnesium 250 MG TABS Take 250 mg by mouth daily.    . metoprolol tartrate (LOPRESSOR) 50 MG tablet Take 50 mg by mouth daily as needed (SVT symptoms).    . multivitamin-lutein (OCUVITE-LUTEIN) CAPS capsule Take 1 capsule by mouth daily.    . potassium chloride (K-DUR) 10 MEQ tablet TAKE 1 TABLET BY MOUTH EVERY DAY 90 tablet 1  . rosuvastatin (CRESTOR) 20 MG tablet TAKE 1 TABLET BY MOUTH EVERYDAY AT BEDTIME (Patient taking differently: Take 20 mg by mouth at bedtime. ) 90 tablet 1  . SUMAtriptan (IMITREX) 50 MG tablet Take 50 mg by mouth every 2 (two) hours as needed for migraine. May repeat in 2 hours if headache persists or recurs.    . traZODone (DESYREL) 50 MG tablet TAKE 1 TABLET BY MOUTH EVERYDAY AT BEDTIME  90 tablet 1  . vitamin B-12 (CYANOCOBALAMIN) 1000 MCG tablet Take 1,000 mcg by mouth daily.    . nitroGLYCERIN (NITROSTAT) 0.4 MG SL tablet Place 1 tablet (0.4 mg total) under the tongue every 5 (five) minutes as needed for chest pain. 25 tablet 3   No current facility-administered medications on file prior to visit.     Past Medical History:  Diagnosis Date  . Anxiety   . Artery stenosis (HCC)    left internal carotid, left subclavian  . Arthritis   . Bleeding tendency ([place]) 05/16/2012   Hx post op bleeding (epidural hematoma s/p ACDF '10; LLQ hematoma due to superficial vessel fascial bleed post colon resection; Normal platelet count; saw  Dr. [name] '14  . CAD in native artery    a. mod to mod-severe CAD by cath 10/2016, med rx.  . Carotid stenosis, bilateral 08/31/2016   1-39% right and 40-59% left ICA stenosis by dopplers 05/2016  . Chronic diastolic CHF (congestive heart failure) ([place])   . Colon polyp   . Diverticulitis   . Family history of cardiovascular disease   . Fracture of right fibula   . GERD (gastroesophageal reflux disease)   . Headache(784.0)    mirgraines - history of  . Hearing aid worn    B/L  . HOH (hard of hearing)    wears hearing aids  . Hyperlipidemia   . Hypertension   . IBS (irritable bowel syndrome)    followed by Dr. [name]  . Liver cyst   . Melanoma ([place])    removed from back 04/2013  . Shortness of breath dyspnea    with exertion  . Stroke ([place]) 06/2012   mild  . SVT (supraventricular tachycardia) ([place])   . Urinary urgency   . UTI (lower urinary tract infection)    x 2 since June 2016  . Wears glasses     Past Surgical History:  Procedure Laterality Date  . ABDOMINAL HYSTERECTOMY    . ANGIOPLASTY     stent in subclavian LEFT 2016, angioplasty of stent 2017  . APPENDECTOMY    . AUGMENTATION MAMMAPLASTY    . BREAST EXCISIONAL BIOPSY Left    benign  . BREAST SURGERY     BIL breast augmentation  . CHOLECYSTECTOMY    . COLON SURGERY     Sigmoid Colectomy, returned for post op bleeding  . COLONOSCOPY W/ POLYPECTOMY    . COLONOSCOPY WITH PROPOFOL N/A 05/05/2016   Procedure: COLONOSCOPY WITH PROPOFOL;  Surgeon: Alphonsa Overall, MD;  Location: [facility];  Service: General;  Laterality: N/A;  . ESOPHAGOGASTRODUODENOSCOPY (EGD) WITH PROPOFOL N/A 05/05/2016   Procedure: ESOPHAGOGASTRODUODENOSCOPY (EGD) WITH PROPOFOL;  Surgeon: Alphonsa Overall, MD;  Location: [facility];  Service: General;  Laterality: N/A;  . EYE SURGERY     eyelid drooping fixed  . IR ANGIO INTRA EXTRACRAN SEL COM CAROTID INNOMINATE BILAT MOD SED  10/16/2018  . IR ANGIO VERTEBRAL SEL SUBCLAVIAN INNOMINATE UNI L MOD  SED  10/16/2018  . IR ANGIO VERTEBRAL SEL VERTEBRAL UNI R MOD SED  10/16/2018  . IR GENERIC HISTORICAL  12/24/2015   IR ANGIO INTRA EXTRACRAN SEL COM CAROTID INNOMINATE BILAT MOD SED 12/24/2015 Luanne Bras, MD [facility]  . IR GENERIC HISTORICAL  12/24/2015   IR ANGIOGRAM EXTREMITY LEFT 12/24/2015 Luanne Bras, MD [facility]  . IR GENERIC HISTORICAL  12/24/2015   IR ANGIO VERTEBRAL SEL VERTEBRAL UNI R MOD SED 12/24/2015 Luanne Bras, MD [facility]  . IR GENERIC HISTORICAL  01/05/2016   IR PTA NON CORO-LOWER EXTREM 01/05/2016 Luanne Bras, MD [facility]  . IR GENERIC HISTORICAL  03/07/2016   IR RADIOLOGIST EVAL & MGMT 03/07/2016 [facility]  . JOINT REPLACEMENT Left    thumb  . LEFT HEART CATH AND CORONARY ANGIOGRAPHY N/A 11/10/2016   Procedure: Left Heart Cath and Coronary Angiography;  Surgeon: Belva Crome, MD;  Location: [facility];  Service: Cardiovascular;  Laterality: N/A;  . MASTOID DEBRIDEMENT    . MASTOIDECTOMY REVISION    . RADIOLOGY WITH ANESTHESIA N/A 11/09/2014   Procedure: STENT PLACEMENT;  Surgeon: Luanne Bras, MD;  Location: [facility];  Service: Radiology;  Laterality: N/A;  . RADIOLOGY WITH ANESTHESIA N/A 05/31/2015   Procedure: RADIOLOGY WITH ANESTHESIA;  Surgeon: Luanne Bras, MD;  Location: [facility];  Service: Radiology;  Laterality: N/A;  . RADIOLOGY WITH ANESTHESIA N/A 01/05/2016   Procedure: RADIOLOGY WITH ANESTHESIA ANGIOPLASTY WITH STENTNG;  Surgeon: Luanne Bras, MD;  Location: [facility];  Service: Radiology;  Laterality: N/A;  . RADIOLOGY WITH ANESTHESIA N/A 10/16/2018   Procedure: STENTING;  Surgeon: Luanne Bras, MD;  Location: [facility];  Service: Radiology;  Laterality: N/A;  . skin cancer excised  04/2013   Melonoma  . SPINE SURGERY     cervical fusion, returned to OR for post op  bleeding  . TONSILLECTOMY    . TONSILLECTOMY    . TUBAL LIGATION    . VAGINA SURGERY     anterior posterior repair    Social History    Socioeconomic History  . Marital status: Divorced    Spouse name: Not on file  . Number of children: 2  . Years of education: 63  . Highest education level: Not on file  Occupational History  . 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 Financial resource strain: Not on file  . Food insecurity    Worry: Not on file    Inability: Not on file  . Transportation needs    Medical: Not on file    Non-medical: Not on file  Tobacco Use  . Smoking status: Former Smoker    Packs/day: 2.00    Years: 22.00    Pack years: 44.00    Types: Cigarettes    Quit date: 04/23/1985    Years since quitting: 33.5  . Smokeless tobacco: Never Used  Substance and Sexual Activity  . Alcohol use: Yes    Alcohol/week: 4.0 standard drinks    Types: 4 Glasses of wine per week    Comment: 1 drink a  day  . Drug use: No  . Sexual activity: Yes    Birth control/protection: Surgical  Lifestyle  . Physical activity    Days per week: Not on file    Minutes per session: Not on file  . Stress: Not on file  Relationships  . Social Herbalist on phone: Not on file    Gets together: Not on file    Attends religious service: Not on file    Active member of club or organization: Not on file    Attends meetings of clubs or organizations: Not on file    Relationship status: Not on file  Other Topics Concern  . Not on file  Social History Narrative   ** Merged History Encounter **       Domestic partner   Regular exercise-no   Right  handed   Caffeine use-- 2 cups coffee daily, rare soda    Family History  Problem Relation Age of Onset  . Hyperlipidemia Other   . Hypertension Other   . Cancer Other        lung, esophagus, stomach  . Stroke Other   . Heart disease Father   . Heart disease Sister   . Heart attack Sister   . Heart disease Brother   . Heart attack Brother   . Stroke Son     Review of Systems   Constitutional: Negative for chills and fever.  Eyes: Negative for visual disturbance.  Respiratory: Positive for shortness of breath (sometimes with exertion). Negative for cough and wheezing.   Cardiovascular: Negative for chest pain, palpitations and leg swelling.  Gastrointestinal: Positive for diarrhea (IBS - imodium daily - controlled ). Negative for abdominal pain, blood in stool, constipation and nausea.       GERD  Genitourinary: Negative for dysuria and hematuria.  Musculoskeletal: Positive for arthralgias (mild arthritis).  Skin: Negative for color change and rash.  Neurological: Positive for dizziness (chronic, daily when lays down at night) and headaches (since angiogram). Negative for light-headedness.  Psychiatric/Behavioral: Positive for dysphoric mood (mild). The patient is nervous/anxious.        Objective:   Vitals:   10/30/18 0808  BP: 130/72  Pulse: 70  Temp: 98.1 F (36.7 C)   Filed Weights   10/30/18 0808  Weight: 154 lb 1.9 oz (69.9 kg)   Body mass index is 27.3 kg/m.  BP Readings from Last 3 Encounters:  10/30/18 130/72  10/16/18 (!) 130/52  10/16/18 (!) 144/54    Wt Readings from Last 3 Encounters:  10/30/18 154 lb 1.9 oz (69.9 kg)  10/16/18 156 lb (70.8 kg)  04/05/18 159 lb (72.1 kg)     Physical Exam Constitutional: She appears well-developed and well-nourished. No distress.  HENT:  Head: Normocephalic and atraumatic.  Right Ear: External ear normal. Normal ear canal and TM Left Ear: External ear normal.  Normal ear canal and TM Mouth/Throat: Oropharynx is clear and moist.  Eyes: Conjunctivae and EOM are normal.  Neck: Neck supple. No tracheal deviation present. No thyromegaly present. b/l carotid bruit  Cardiovascular: Normal rate, regular rhythm and normal heart sounds.   No murmur heard.  No edema. Pulmonary/Chest: Effort normal and breath sounds normal. No respiratory distress. She has no wheezes. She has no rales.  Breast:  deferred   Abdominal: Soft. She exhibits no distension. There is no tenderness.  Lymphadenopathy: She has no cervical adenopathy.  Skin: Skin is warm and dry. She is not diaphoretic.  Psychiatric: She has a normal mood and affect. Her behavior is normal.        Assessment & Plan:   Physical exam: Screening blood work    ordered Immunizations  Pneumovax done - she will get up record,  Discussed td, discussed shingrix Colonoscopy   Up to date  Mammogram   Scheduled for 8/14 Gyn   - no longer seeing Dexa   Due - will order Eye exams   Up to date  Exercise  Walking, water exercise Weight  Overweight  Skin sees derm - no concerns today Substance abuse   none    See Problem List for Assessment and Plan of chronic medical problems.   FU in 6 months

## 2018-10-29 NOTE — Patient Instructions (Addendum)
Tests ordered today. Your results will be released to [place] (or called to you) after review.  If any changes need to be made, you will be notified at that same time.  All other Health Maintenance issues reviewed.   All recommended immunizations and age-appropriate screenings are up-to-date or discussed.  No immunization administered today.   A bone density was ordered - [facility] and schedule the dexa.   Medications reviewed and updated.  Changes include :   Premarin vaginal cream  Your prescription(s) have been submitted to your pharmacy. Please take as directed and contact our office if you believe you are having problem(s) with the medication(s).   Please followup in 6 months    Health Maintenance, Female Adopting a healthy lifestyle and getting preventive care are important in promoting health and wellness. Ask your health care provider about:  The right schedule for you to have regular tests and exams.  Things you can do on your own to prevent diseases and keep yourself healthy. What should I know about diet, weight, and exercise? Eat a healthy diet   Eat a diet that includes plenty of vegetables, fruits, low-fat dairy products, and lean protein.  Do not eat a lot of foods that are high in solid fats, added sugars, or sodium. Maintain a healthy weight Body mass index (BMI) is used to identify weight problems. It estimates body fat based on height and weight. Your health care provider can help determine your BMI and help you achieve or maintain a healthy weight. Get regular exercise Get regular exercise. This is one of the most important things you can do for your health. Most adults should:  Exercise for at least 150 minutes each week. The exercise should increase your heart rate and make you sweat (moderate-intensity exercise).  Do strengthening exercises at least twice a week. This is in addition to the moderate-intensity exercise.  Spend less time  sitting. Even light physical activity can be beneficial. Watch cholesterol and blood lipids Have your blood tested for lipids and cholesterol at 75 years of age, then have this test every 5 years. Have your cholesterol levels checked more often if:  Your lipid or cholesterol levels are high.  You are older than 75 years of age.  You are at high risk for heart disease. What should I know about cancer screening? Depending on your health history and family history, you may need to have cancer screening at various ages. This may include screening for:  Breast cancer.  Cervical cancer.  Colorectal cancer.  Skin cancer.  Lung cancer. What should I know about heart disease, diabetes, and high blood pressure? Blood pressure and heart disease  High blood pressure causes heart disease and increases the risk of stroke. This is more likely to develop in people who have high blood pressure readings, are of African descent, or are overweight.  Have your blood pressure checked: ? Every 3-5 years if you are 43-52 years of age. ? Every year if you are 45 years old or older. Diabetes Have regular diabetes screenings. This checks your fasting blood sugar level. Have the screening done:  Once every three years after age 10 if you are at a normal weight and have a low risk for diabetes.  More often and at a younger age if you are overweight or have a high risk for diabetes. What should I know about preventing infection? Hepatitis B If you have a higher risk for hepatitis B, you should be  screened for this virus. Talk with your health care provider to find out if you are at risk for hepatitis B infection. Hepatitis C Testing is recommended for:  Everyone born from [birth year] through 1967.  Anyone with known risk factors for hepatitis C. Sexually transmitted infections (STIs)  Get screened for STIs, including gonorrhea and chlamydia, if: ? You are sexually active and are younger than 75 years of  age. ? You are older than 75 years of age and your health care provider tells you that you are at risk for this type of infection. ? Your sexual activity has changed since you were last screened, and you are at increased risk for chlamydia or gonorrhea. Ask your health care provider if you are at risk.  Ask your health care provider about whether you are at high risk for HIV. Your health care provider may recommend a prescription medicine to help prevent HIV infection. If you choose to take medicine to prevent HIV, you should first get tested for HIV. You should then be tested every 3 months for as long as you are taking the medicine. Pregnancy  If you are about to stop having your period (premenopausal) and you may become pregnant, seek counseling before you get pregnant.  Take 400 to 800 micrograms (mcg) of folic acid every day if you become pregnant.  Ask for birth control (contraception) if you want to prevent pregnancy. Osteoporosis and menopause Osteoporosis is a disease in which the bones lose minerals and strength with aging. This can result in bone fractures. If you are 9 years old or older, or if you are at risk for osteoporosis and fractures, ask your health care provider if you should:  Be screened for bone loss.  Take a calcium or vitamin D supplement to lower your risk of fractures.  Be given hormone replacement therapy (HRT) to treat symptoms of menopause. Follow these instructions at home: Lifestyle  Do not use any products that contain nicotine or tobacco, such as cigarettes, e-cigarettes, and chewing tobacco. If you need help quitting, ask your health care provider.  Do not use street drugs.  Do not share needles.  Ask your health care provider for help if you need support or information about quitting drugs. Alcohol use  Do not drink alcohol if: ? Your health care provider tells you not to drink. ? You are pregnant, may be pregnant, or are planning to become  pregnant.  If you drink alcohol: ? Limit how much you use to 0-1 drink a day. ? Limit intake if you are breastfeeding.  Be aware of how much alcohol is in your drink. In the [place] one drink equals one 12 oz bottle of beer (355 mL), one 5 oz glass of wine (148 mL), or one 1 oz glass of hard liquor (44 mL). General instructions  Schedule regular health, dental, and eye exams.  Stay current with your vaccines.  Tell your health care provider if: ? You often feel depressed. ? You have ever been abused or do not feel safe at home. Summary  Adopting a healthy lifestyle and getting preventive care are important in promoting health and wellness.  Follow your health care provider's instructions about healthy diet, exercising, and getting tested or screened for diseases.  Follow your health care provider's instructions on monitoring your cholesterol and blood pressure. This information is not intended to replace advice given to you by your health care provider. Make sure you discuss any questions you have with your health  care provider. Document Released: 10/17/2010 Document Revised: 03/27/2018 Document Reviewed: 03/27/2018 Elsevier Patient Education  2020 [employer].

## 2018-10-30 ENCOUNTER — Encounter: Payer: Self-pay | Admitting: Internal Medicine

## 2018-10-30 ENCOUNTER — Ambulatory Visit (INDEPENDENT_AMBULATORY_CARE_PROVIDER_SITE_OTHER): Payer: PPO | Admitting: Internal Medicine

## 2018-10-30 ENCOUNTER — Other Ambulatory Visit: Payer: Self-pay

## 2018-10-30 ENCOUNTER — Other Ambulatory Visit (INDEPENDENT_AMBULATORY_CARE_PROVIDER_SITE_OTHER): Payer: PPO

## 2018-10-30 VITALS — BP 130/72 | HR 70 | Temp 98.1°F | Ht 63.0 in | Wt 154.1 lb

## 2018-10-30 DIAGNOSIS — R7303 Prediabetes: Secondary | ICD-10-CM

## 2018-10-30 DIAGNOSIS — Z13820 Encounter for screening for osteoporosis: Secondary | ICD-10-CM

## 2018-10-30 DIAGNOSIS — G43109 Migraine with aura, not intractable, without status migrainosus: Secondary | ICD-10-CM | POA: Diagnosis not present

## 2018-10-30 DIAGNOSIS — Z0000 Encounter for general adult medical examination without abnormal findings: Secondary | ICD-10-CM

## 2018-10-30 DIAGNOSIS — K219 Gastro-esophageal reflux disease without esophagitis: Secondary | ICD-10-CM | POA: Diagnosis not present

## 2018-10-30 DIAGNOSIS — I10 Essential (primary) hypertension: Secondary | ICD-10-CM

## 2018-10-30 DIAGNOSIS — D510 Vitamin B12 deficiency anemia due to intrinsic factor deficiency: Secondary | ICD-10-CM | POA: Diagnosis not present

## 2018-10-30 DIAGNOSIS — I6523 Occlusion and stenosis of bilateral carotid arteries: Secondary | ICD-10-CM | POA: Diagnosis not present

## 2018-10-30 DIAGNOSIS — F418 Other specified anxiety disorders: Secondary | ICD-10-CM | POA: Diagnosis not present

## 2018-10-30 DIAGNOSIS — G4700 Insomnia, unspecified: Secondary | ICD-10-CM | POA: Diagnosis not present

## 2018-10-30 DIAGNOSIS — N951 Menopausal and female climacteric states: Secondary | ICD-10-CM | POA: Diagnosis not present

## 2018-10-30 DIAGNOSIS — E785 Hyperlipidemia, unspecified: Secondary | ICD-10-CM | POA: Diagnosis not present

## 2018-10-30 DIAGNOSIS — I5032 Chronic diastolic (congestive) heart failure: Secondary | ICD-10-CM

## 2018-10-30 LAB — COMPREHENSIVE METABOLIC PANEL
ALT: 12 U/L (ref 0–35)
AST: 14 U/L (ref 0–37)
Albumin: 4.5 g/dL (ref 3.5–5.2)
Alkaline Phosphatase: 71 U/L (ref 39–117)
BUN: 13 mg/dL (ref 6–23)
CO2: 27 mEq/L (ref 19–32)
Calcium: 9.8 mg/dL (ref 8.4–10.5)
Chloride: 104 mEq/L (ref 96–112)
Creatinine, Ser: 1.02 mg/dL (ref 0.40–1.20)
GFR: 52.89 mL/min — ABNORMAL LOW (ref >60.00)
Glucose, Bld: 111 mg/dL — ABNORMAL HIGH (ref 70–99)
Potassium: 4 mEq/L (ref 3.5–5.1)
Sodium: 139 mEq/L (ref 135–145)
Total Bilirubin: 0.5 mg/dL (ref 0.2–1.2)
Total Protein: 6.8 g/dL (ref 6.0–8.3)

## 2018-10-30 LAB — LIPID PANEL
Cholesterol: 150 mg/dL (ref 0–200)
HDL: 63.3 mg/dL (ref >39.00)
NonHDL: 86.23
Total CHOL/HDL Ratio: 2
Triglycerides: 223 mg/dL — ABNORMAL HIGH (ref 0.0–149.0)
VLDL: 44.6 mg/dL — ABNORMAL HIGH (ref 0.0–40.0)

## 2018-10-30 LAB — HEMOGLOBIN A1C: Hgb A1c MFr Bld: 6 % (ref 4.6–6.5)

## 2018-10-30 LAB — VITAMIN B12: Vitamin B-12: >1500 pg/mL — ABNORMAL HIGH (ref 211–911)

## 2018-10-30 LAB — LDL CHOLESTEROL, DIRECT: Direct LDL: 50 mg/dL

## 2018-10-30 MED ORDER — PREMARIN 0.625 MG/GM VA CREA: TOPICAL_CREAM | VAGINAL | 12 refills | Status: DC

## 2018-10-30 NOTE — Assessment & Plan Note (Signed)
euvolemic  Following with cardiology

## 2018-10-30 NOTE — Assessment & Plan Note (Signed)
Following with IR

## 2018-10-30 NOTE — Assessment & Plan Note (Signed)
Still having hotflashes and bladder spasms Will try premarin vaginal cream - if no improvement consider urology consult

## 2018-10-30 NOTE — Assessment & Plan Note (Signed)
Controlled Takes imitrex prn

## 2018-10-30 NOTE — Assessment & Plan Note (Signed)
continue xanax prn  °

## 2018-10-30 NOTE — Assessment & Plan Note (Signed)
Taking B12 orally Check B12 level

## 2018-10-30 NOTE — Assessment & Plan Note (Signed)
Check a1c Low sugar / carb diet Stressed regular exercise   

## 2018-10-30 NOTE — Assessment & Plan Note (Signed)
Taking trazodone Controlled, stable Continue current dose of medication

## 2018-10-30 NOTE — Assessment & Plan Note (Signed)
Check lipid panel  Continue daily statin Regular exercise and healthy diet encouraged  

## 2018-10-30 NOTE — Assessment & Plan Note (Signed)
Fairly Continue daily medication

## 2018-10-30 NOTE — Assessment & Plan Note (Signed)
BP well controlled Current regimen effective and well tolerated Continue current medications at current doses cmp  

## 2018-11-04 ENCOUNTER — Other Ambulatory Visit: Payer: Self-pay | Admitting: Internal Medicine

## 2018-11-06 ENCOUNTER — Ambulatory Visit (INDEPENDENT_AMBULATORY_CARE_PROVIDER_SITE_OTHER): Payer: PPO

## 2018-11-06 DIAGNOSIS — Z299 Encounter for prophylactic measures, unspecified: Secondary | ICD-10-CM

## 2018-11-06 DIAGNOSIS — Z23 Encounter for immunization: Secondary | ICD-10-CM | POA: Diagnosis not present

## 2018-11-06 NOTE — Telephone Encounter (Signed)
Last OV 10/30/18 Next OV 05/02/19 Last RF 06/06/18

## 2018-11-12 ENCOUNTER — Other Ambulatory Visit: Payer: Self-pay | Admitting: Internal Medicine

## 2018-11-29 ENCOUNTER — Ambulatory Visit: Payer: PPO

## 2018-12-24 ENCOUNTER — Emergency Department (HOSPITAL_COMMUNITY): Payer: PPO

## 2018-12-24 ENCOUNTER — Encounter (HOSPITAL_COMMUNITY): Payer: Self-pay

## 2018-12-24 ENCOUNTER — Emergency Department (HOSPITAL_COMMUNITY)
Admission: EM | Admit: 2018-12-24 | Discharge: 2018-12-24 | Disposition: A | Discharge status: Home / Self Care | Payer: PPO | Attending: Emergency Medicine | Admitting: Emergency Medicine

## 2018-12-24 ENCOUNTER — Other Ambulatory Visit: Payer: Self-pay

## 2018-12-24 DIAGNOSIS — Z87891 Personal history of nicotine dependence: Secondary | ICD-10-CM | POA: Diagnosis not present

## 2018-12-24 DIAGNOSIS — Z85820 Personal history of malignant melanoma of skin: Secondary | ICD-10-CM | POA: Insufficient documentation

## 2018-12-24 DIAGNOSIS — M25572 Pain in left ankle and joints of left foot: Secondary | ICD-10-CM | POA: Diagnosis not present

## 2018-12-24 DIAGNOSIS — Z7901 Long term (current) use of anticoagulants: Secondary | ICD-10-CM | POA: Diagnosis not present

## 2018-12-24 DIAGNOSIS — I5032 Chronic diastolic (congestive) heart failure: Secondary | ICD-10-CM | POA: Diagnosis not present

## 2018-12-24 DIAGNOSIS — I110 Hypertensive heart disease with heart failure: Secondary | ICD-10-CM | POA: Insufficient documentation

## 2018-12-24 DIAGNOSIS — I2510 Atherosclerotic heart disease of native coronary artery without angina pectoris: Secondary | ICD-10-CM | POA: Insufficient documentation

## 2018-12-24 DIAGNOSIS — M25551 Pain in right hip: Secondary | ICD-10-CM | POA: Diagnosis not present

## 2018-12-24 DIAGNOSIS — R109 Unspecified abdominal pain: Secondary | ICD-10-CM | POA: Diagnosis not present

## 2018-12-24 DIAGNOSIS — Z7982 Long term (current) use of aspirin: Secondary | ICD-10-CM | POA: Insufficient documentation

## 2018-12-24 DIAGNOSIS — R52 Pain, unspecified: Secondary | ICD-10-CM | POA: Diagnosis not present

## 2018-12-24 DIAGNOSIS — Z79899 Other long term (current) drug therapy: Secondary | ICD-10-CM | POA: Diagnosis not present

## 2018-12-24 DIAGNOSIS — S79911A Unspecified injury of right hip, initial encounter: Secondary | ICD-10-CM | POA: Diagnosis not present

## 2018-12-24 DIAGNOSIS — M5489 Other dorsalgia: Secondary | ICD-10-CM | POA: Diagnosis not present

## 2018-12-24 MED ORDER — OXYCODONE-ACETAMINOPHEN 5-325 MG PO TABS
2.0000 | ORAL_TABLET | Freq: Once | ORAL | Status: AC
  Administered 2018-12-24: 2 via ORAL
  Filled 2018-12-24: qty 2

## 2018-12-24 MED ORDER — OXYCODONE-ACETAMINOPHEN 5-325 MG PO TABS: 1.0000 | ORAL_TABLET | Freq: Four times a day (QID) | ORAL | 0 refills | Status: DC | PRN

## 2018-12-24 MED ORDER — CYCLOBENZAPRINE HCL 10 MG PO TABS: 10.0000 mg | ORAL_TABLET | Freq: Three times a day (TID) | ORAL | 0 refills | Status: DC | PRN

## 2018-12-24 MED ORDER — CYCLOBENZAPRINE HCL 10 MG PO TABS
10.0000 mg | ORAL_TABLET | Freq: Once | ORAL | Status: AC
  Administered 2018-12-24: 10 mg via ORAL
  Filled 2018-12-24: qty 1

## 2018-12-24 MED ORDER — HYDROMORPHONE HCL 1 MG/ML IJ SOLN
1.0000 mg | Freq: Once | INTRAMUSCULAR | Status: AC
  Administered 2018-12-24: 1 mg via INTRAMUSCULAR
  Filled 2018-12-24: qty 1

## 2018-12-24 NOTE — ED Notes (Signed)
Patient has ambulated to the restroom and back with assistance x 1 person.

## 2018-12-24 NOTE — ED Provider Notes (Signed)
[facility] DEPT Provider Note   CSN: [id number] Arrival date & time: 12/24/18  1708     History   Chief Complaint Chief Complaint  Patient presents with  . Back Pain    HPI Susan Gillespie is a 73 y.o. female.     Patient is a 73 year old female with history of coronary artery disease, hypertension, irritable bowel.  She presents today for evaluation of pain in her right hip/buttock.  Patient states that she was cleaning the inside of the car when she backed her hip into an unknown object.  She complains of severe, stabbing pain to the back of her right hip.  She was able to ambulate to the house, but pain worsened and she was unable to walk.  She was brought here by EMS.  Pain is worse with palpation and movement.  There are no alleviating factors.  The history is provided by the patient.    Past Medical History:  Diagnosis Date  . Anxiety   . Artery stenosis (HCC)    left internal carotid, left subclavian  . Arthritis   . Bleeding tendency ([place]) 05/16/2012   Hx post op bleeding (epidural hematoma s/p ACDF '10; LLQ hematoma due to superficial vessel fascial bleed post colon resection; Normal platelet count; saw Dr. [name] '14  . CAD in native artery    a. mod to mod-severe CAD by cath 10/2016, med rx.  . Carotid stenosis, bilateral 08/31/2016   1-39% right and 40-59% left ICA stenosis by dopplers 05/2016  . Chronic diastolic CHF (congestive heart failure) ([place])   . Colon polyp   . Diverticulitis   . Family history of cardiovascular disease   . Fracture of right fibula   . GERD (gastroesophageal reflux disease)   . Headache(784.0)    mirgraines - history of  . Hearing aid worn    B/L  . HOH (hard of hearing)    wears hearing aids  . Hyperlipidemia   . Hypertension   . IBS (irritable bowel syndrome)    followed by Dr. [name]  . Liver cyst   . Melanoma ([place])    removed from back 04/2013  . Shortness of breath dyspnea    with exertion   . Stroke ([place]) 06/2012   mild  . SVT (supraventricular tachycardia) ([place])   . Urinary urgency   . UTI (lower urinary tract infection)    x 2 since June 2016  . Wears glasses     Patient Active Problem List   Diagnosis Date Noted  . Post menopausal syndrome 10/09/2017  . Migraine without status migrainosus, not intractable 10/09/2017  . Spondylosis of cervical region without myelopathy or radiculopathy 10/09/2017  . Insomnia 10/09/2017  . IBS (irritable bowel syndrome) 10/09/2017  . Chronic diastolic CHF (congestive heart failure), NYHA class 1 ([place]) 02/20/2017  . CAD in native artery 11/10/2016  . Carotid stenosis, bilateral 08/31/2016  . SOB (shortness of breath) 08/31/2016  . 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 Pernicious anemia 09/15/2013  . B12 deficiency anemia 09/15/2013  . Situational anxiety 12/20/2012  . Spinal stenosis of lumbar region 07/05/2012  . Cerebral infarction ([place]) 06/28/2012  . Bleeding tendency ([place]) 05/16/2012  . Paroxysmal supraventricular tachycardia ([place]) 09/25/2011  . Essential hypertension, benign 08/22/2010  . Prediabetes 04/28/2009  . Hyperlipidemia LDL goal <70 12/16/2008  . GERD 12/16/2008  . Osteoarthritis 12/16/2008    Past Surgical History:  Procedure Laterality Date  . ABDOMINAL HYSTERECTOMY    . ANGIOPLASTY  stent in subclavian LEFT 2016, angioplasty of stent 2017  . APPENDECTOMY    . AUGMENTATION MAMMAPLASTY    . BREAST EXCISIONAL BIOPSY Left    benign  . BREAST SURGERY     BIL breast augmentation  . CHOLECYSTECTOMY    . COLON SURGERY     Sigmoid Colectomy, returned for post op bleeding  . COLONOSCOPY W/ POLYPECTOMY    . COLONOSCOPY WITH PROPOFOL N/A 05/05/2016   Procedure: COLONOSCOPY WITH PROPOFOL;  Surgeon: Alphonsa Overall, MD;  Location: [facility];  Service: General;  Laterality: N/A;  . ESOPHAGOGASTRODUODENOSCOPY (EGD) WITH PROPOFOL N/A 05/05/2016   Procedure: ESOPHAGOGASTRODUODENOSCOPY (EGD) WITH PROPOFOL;   Surgeon: Alphonsa Overall, MD;  Location: [facility];  Service: General;  Laterality: N/A;  . EYE SURGERY     eyelid drooping fixed  . IR ANGIO INTRA EXTRACRAN SEL COM CAROTID INNOMINATE BILAT MOD SED  10/16/2018  . IR ANGIO VERTEBRAL SEL SUBCLAVIAN INNOMINATE UNI L MOD SED  10/16/2018  . IR ANGIO VERTEBRAL SEL VERTEBRAL UNI R MOD SED  10/16/2018  . IR GENERIC HISTORICAL  12/24/2015   IR ANGIO INTRA EXTRACRAN SEL COM CAROTID INNOMINATE BILAT MOD SED 12/24/2015 Luanne Bras, MD [facility]  . IR GENERIC HISTORICAL  12/24/2015   IR ANGIOGRAM EXTREMITY LEFT 12/24/2015 Luanne Bras, MD [facility]  . IR GENERIC HISTORICAL  12/24/2015   IR ANGIO VERTEBRAL SEL VERTEBRAL UNI R MOD SED 12/24/2015 Luanne Bras, MD [facility]  . IR GENERIC HISTORICAL  01/05/2016   IR PTA NON CORO-LOWER EXTREM 01/05/2016 Luanne Bras, MD [facility]  . IR GENERIC HISTORICAL  03/07/2016   IR RADIOLOGIST EVAL & MGMT 03/07/2016 [facility]  . JOINT REPLACEMENT Left    thumb  . LEFT HEART CATH AND CORONARY ANGIOGRAPHY N/A 11/10/2016   Procedure: Left Heart Cath and Coronary Angiography;  Surgeon: Belva Crome, MD;  Location: [facility];  Service: Cardiovascular;  Laterality: N/A;  . MASTOID DEBRIDEMENT    . MASTOIDECTOMY REVISION    . RADIOLOGY WITH ANESTHESIA N/A 11/09/2014   Procedure: STENT PLACEMENT;  Surgeon: Luanne Bras, MD;  Location: [facility];  Service: Radiology;  Laterality: N/A;  . RADIOLOGY WITH ANESTHESIA N/A 05/31/2015   Procedure: RADIOLOGY WITH ANESTHESIA;  Surgeon: Luanne Bras, MD;  Location: [facility];  Service: Radiology;  Laterality: N/A;  . RADIOLOGY WITH ANESTHESIA N/A 01/05/2016   Procedure: RADIOLOGY WITH ANESTHESIA ANGIOPLASTY WITH STENTNG;  Surgeon: Luanne Bras, MD;  Location: [facility];  Service: Radiology;  Laterality: N/A;  . RADIOLOGY WITH ANESTHESIA N/A 10/16/2018   Procedure: STENTING;  Surgeon: Luanne Bras, MD;  Location: [facility];  Service: Radiology;   Laterality: N/A;  . skin cancer excised  04/2013   Melonoma  . SPINE SURGERY     cervical fusion, returned to OR for post op  bleeding  . TONSILLECTOMY    . TONSILLECTOMY    . TUBAL LIGATION    . VAGINA SURGERY     anterior posterior repair     OB History   No obstetric history on file.      Home Medications    Prior to Admission medications   Medication Sig Start Date End Date Taking? Authorizing Provider  acetaminophen (TYLENOL) 650 MG CR tablet Take 1,300 mg by mouth every 8 (eight) hours as needed for pain.     [provider]  ALPRAZolam (XANAX) 0.5 MG tablet TAKE 0.5 TABLETS (0.25 MG TOTAL) BY MOUTH AT BEDTIME AS NEEDED FOR ANXIETY OR SLEEP. 11/06/18  Binnie Rail, MD  aspirin 325 MG tablet Take 1 tablet (325 mg total) by mouth daily. 11/10/14   Tsosie Billing D, PA-C  celecoxib (CELEBREX) 200 MG capsule Take 200 mg by mouth daily as needed for mild pain.    [provider]  cetirizine (ZYRTEC) 10 MG tablet Take 10 mg by mouth every morning.     [provider]  clopidogrel (PLAVIX) 75 MG tablet TAKE 1 TABLET BY MOUTH EVERY DAY 10/17/18   Binnie Rail, MD  conjugated estrogens (PREMARIN) vaginal cream 1 applicator full into vagina twice a week 10/30/18   Binnie Rail, MD  diltiazem (CARDIZEM CD) 180 MG 24 hr capsule TAKE 1 CAPSULE BY MOUTH EVERY DAY 05/01/18   Camnitz, Ocie Doyne, MD  famotidine (PEPCID) 40 MG tablet TAKE 1 TABLET BY MOUTH EVERYDAY AT BEDTIME Patient taking differently: Take 40 mg by mouth at bedtime.  07/22/18   Binnie Rail, MD  ferrous sulfate 325 (65 FE) MG tablet Take 325 mg by mouth daily with breakfast.    [provider]  furosemide (LASIX) 20 MG tablet TAKE 1 TABLET BY MOUTH EVERY DAY 04/29/18   Dunn, Nedra Hai, PA-C  loperamide (IMODIUM A-D) 2 MG tablet Take 2 mg by mouth daily.    [provider]  losartan (COZAAR) 50 MG tablet TAKE 1 TABLET BY MOUTH EVERY DAY 10/21/18   Binnie Rail, MD  Magnesium 250 MG  TABS Take 250 mg by mouth daily.    [provider]  metoprolol tartrate (LOPRESSOR) 50 MG tablet Take 50 mg by mouth daily as needed (SVT symptoms).    [provider]  multivitamin-lutein (OCUVITE-LUTEIN) CAPS capsule Take 1 capsule by mouth daily.    [provider]  nitroGLYCERIN (NITROSTAT) 0.4 MG SL tablet Place 1 tablet (0.4 mg total) under the tongue every 5 (five) minutes as needed for chest pain. 11/10/16 10/10/18  Eileen Stanford, PA-C  potassium chloride (K-DUR) 10 MEQ tablet TAKE 1 TABLET BY MOUTH EVERY DAY 07/22/18   Burns, Claudina Lick, MD  rosuvastatin (CRESTOR) 20 MG tablet TAKE 1 TABLET BY MOUTH EVERYDAY AT BEDTIME Patient taking differently: Take 20 mg by mouth at bedtime.  07/22/18   Binnie Rail, MD  SUMAtriptan (IMITREX) 50 MG tablet Take 50 mg by mouth every 2 (two) hours as needed for migraine. May repeat in 2 hours if headache persists or recurs.    [provider]  traZODone (DESYREL) 50 MG tablet TAKE 1 TABLET BY MOUTH EVERYDAY AT BEDTIME 07/21/18   Burns, Claudina Lick, MD  vitamin B-12 (CYANOCOBALAMIN) 1000 MCG tablet Take 1,000 mcg by mouth daily.    [provider]    Family History Family History  Problem Relation Age of Onset  . Hyperlipidemia Other   . Hypertension Other   . Cancer Other        lung, esophagus, stomach  . Stroke Other   . Heart disease Father   . Heart disease Sister   . Heart attack Sister   . Heart disease Brother   . Heart attack Brother   . Stroke Son     Social History Social History   Tobacco Use  . Smoking status: Former Smoker    Packs/day: 2.00    Years: 22.00    Pack years: 44.00    Types: Cigarettes    Quit date: 04/23/1985    Years since quitting: 33.6  . Smokeless tobacco: Never Used  Substance Use  Topics  . Alcohol use: Yes    Alcohol/week: 4.0 standard drinks    Types: 4 Glasses of wine per week    Comment: 1 drink a  day  . Drug use: No     Allergies   Phenergan  [promethazine hcl], Daypro [oxaprozin], Lisinopril, Other, Simvastatin, and Tramadol   Review of Systems Review of Systems  All other systems reviewed and are negative.    Physical Exam Updated Vital Signs BP (!) 136/51 (BP Location: Right Arm)   Pulse 65   Temp 98.3 F (36.8 C) (Oral)   Resp 16   Ht 5' 3.25" (1.607 m)   Wt 70.3 kg   LMP  (LMP Unknown)   SpO2 100%   BMI 27.24 kg/m   Physical Exam Vitals signs and nursing note reviewed.  Constitutional:      General: She is not in acute distress.    Appearance: Normal appearance. She is not ill-appearing, toxic-appearing or diaphoretic.  HENT:     Head: Normocephalic and atraumatic.  Pulmonary:     Effort: Pulmonary effort is normal.  Musculoskeletal:     Comments: There is exquisite tenderness to the posterior aspect of the right upper buttock/posterior hip.  There is no obvious trauma or deformity.  There is no shortening or rotation of the leg.  Distal sensation and DP pulses are intact.  Skin:    General: Skin is warm and dry.  Neurological:     Mental Status: She is alert.      ED Treatments / Results  Labs (all labs ordered are listed, but only abnormal results are displayed) Labs Reviewed - No data to display  EKG None  Radiology No results found.  Procedures Procedures (including critical care time)  Medications Ordered in ED Medications  oxyCODONE-acetaminophen (PERCOCET/ROXICET) 5-325 MG per tablet 2 tablet (has no administration in time range)     Initial Impression / Assessment and Plan / ED Course  I have reviewed the triage vital signs and the nursing notes.  Pertinent labs & imaging results that were available during my care of the patient were reviewed by me and considered in my medical decision making (see chart for details).  Patient presenting here with complaints of severe pain in her right posterior hip that began suddenly while she was cleaning out her car.  Patient describes  severe pain in this area that is worse when she attempts to lift her leg, stand, and ambulate.  The leg is neurovascularly intact with no deficits.  There is a tender spot to the soft tissues just above her right buttock.  X-rays show no evidence for fracture.  Patient with ongoing pain despite Percocet.  CT scans were obtained of the abdomen and pelvis to rule out other pathology such as possible retroperitoneal hematoma, renal calculus, or other issues.  This was performed and was also unremarkable.  Patient's pain appears to be musculoskeletal in nature.  She was able to ambulate to the bathroom, but continues with significant discomfort.  She is requesting to go home.  She will be prescribed pain medication and muscle relaxers and is to follow-up with primary doctor if she is not improving.  Final Clinical Impressions(s) / ED Diagnoses   Final diagnoses:  None    ED Discharge Orders    None       Veryl Speak, MD 12/24/18 2230

## 2018-12-24 NOTE — Discharge Instructions (Addendum)
Percocet and Flexeril as prescribed as needed for pain.  Follow-up with your primary doctor if symptoms or not improving in the next week, and return to the ER if symptoms significantly worsen or change.

## 2018-12-24 NOTE — ED Triage Notes (Signed)
Pt BIBA from home. Pt was cleaning the car and backed hip into car. Pt c/o sharp pain in right hip. Does not spread. No numbness/tingling. Pt ambulatory to bed.

## 2018-12-24 NOTE — ED Notes (Signed)
Patient transported to X-ray 

## 2018-12-24 NOTE — ED Notes (Signed)
Patient in CT

## 2018-12-30 ENCOUNTER — Other Ambulatory Visit: Payer: Self-pay | Admitting: Internal Medicine

## 2018-12-30 NOTE — Telephone Encounter (Signed)
Requested medications are due for refill today?  Yes  Requested medications are on the active medication list?  Yes  Last refill - 12/24/2018, #15, 0 refills   Future visit scheduled?  Yes- 05/02/2019  Notes to clinic-  Requested Prescriptions  Pending Prescriptions Disp Refills   cyclobenzaprine (FLEXERIL) 10 MG tablet 15 tablet 0    Sig: Take 1 tablet (10 mg total) by mouth 3 (three) times daily as needed for muscle spasms.     Not Delegated - Analgesics:  Muscle Relaxants Failed - 12/30/2018  3:54 PM      Failed - This refill cannot be delegated      Passed - Valid encounter within last 6 months    Recent Outpatient Visits          2 months ago Preventative health care   [place], MD   8 months ago Essential hypertension, benign   [place], MD   1 year ago Hyperlipidemia LDL goal <70   [place], MD   5 years ago Prediabetes   [place], MD   6 years ago Low back pain radiating to both legs   [place], MD      Future Appointments            In 4 months Burns, Claudina Lick, MD [place]

## 2018-12-30 NOTE — Telephone Encounter (Signed)
Copied from [facility] [id number]. Topic: Quick Communication - Rx Refill/Question >> Dec 30, 2018  3:44 PM Rainey Pines A wrote: Medication: cyclobenzaprine (FLEXERIL) 5 MG tablet   Has the patient contacted their pharmacy? Yes (Agent: If no, request that the patient contact the pharmacy for the refill.) (Agent: If yes, when and what did the pharmacy advise?)Contact PCP  Preferred Pharmacy (with phone number or street name): [id number] - [place], [address] [phone] (Phone) [phone] (Fax)    Agent: Please be advised that RX refills may take up to 3 business days. We ask that you follow-up with your pharmacy.

## 2018-12-31 MED ORDER — CYCLOBENZAPRINE HCL 5 MG PO TABS: 5.0000 mg | ORAL_TABLET | Freq: Three times a day (TID) | ORAL | 0 refills | Status: DC | PRN

## 2018-12-31 NOTE — Telephone Encounter (Signed)
Wants an rx for 5 mg of flexeril instead of 10 mg. Please advise. Was given in the ED when she went recently.

## 2019-01-07 ENCOUNTER — Other Ambulatory Visit (HOSPITAL_COMMUNITY): Payer: Self-pay | Admitting: Interventional Radiology

## 2019-01-07 DIAGNOSIS — I771 Stricture of artery: Secondary | ICD-10-CM

## 2019-01-07 DIAGNOSIS — R42 Dizziness and giddiness: Secondary | ICD-10-CM

## 2019-01-08 ENCOUNTER — Telehealth (HOSPITAL_COMMUNITY): Payer: Self-pay

## 2019-01-08 ENCOUNTER — Other Ambulatory Visit: Payer: Self-pay | Admitting: Internal Medicine

## 2019-01-08 NOTE — Telephone Encounter (Signed)
Called to schedule us carotid, no answer, left vm. AW  

## 2019-01-09 ENCOUNTER — Ambulatory Visit (HOSPITAL_COMMUNITY)
Admission: RE | Admit: 2019-01-09 | Discharge: 2019-01-09 | Disposition: A | Discharge status: Home / Self Care | Payer: PPO | Source: Ambulatory Visit | Attending: Interventional Radiology | Admitting: Interventional Radiology

## 2019-01-09 ENCOUNTER — Other Ambulatory Visit: Payer: Self-pay

## 2019-01-09 DIAGNOSIS — R42 Dizziness and giddiness: Secondary | ICD-10-CM | POA: Diagnosis not present

## 2019-01-09 DIAGNOSIS — I771 Stricture of artery: Secondary | ICD-10-CM

## 2019-01-09 NOTE — Telephone Encounter (Signed)
[place] Controlled Database Checked Last filled: 11/07/18 #30 LOV w/you:10/30/18 Next appt w/you: 05/01/18

## 2019-01-13 ENCOUNTER — Ambulatory Visit
Admission: RE | Admit: 2019-01-13 | Discharge: 2019-01-13 | Disposition: A | Discharge status: Home / Self Care | Payer: PPO | Source: Ambulatory Visit | Attending: Internal Medicine | Admitting: Internal Medicine

## 2019-01-13 ENCOUNTER — Other Ambulatory Visit: Payer: Self-pay

## 2019-01-13 DIAGNOSIS — Z1231 Encounter for screening mammogram for malignant neoplasm of breast: Secondary | ICD-10-CM

## 2019-01-13 DIAGNOSIS — Z13820 Encounter for screening for osteoporosis: Secondary | ICD-10-CM | POA: Diagnosis not present

## 2019-01-13 DIAGNOSIS — Z780 Asymptomatic menopausal state: Secondary | ICD-10-CM | POA: Diagnosis not present

## 2019-01-14 ENCOUNTER — Other Ambulatory Visit: Payer: Self-pay | Admitting: Internal Medicine

## 2019-01-14 ENCOUNTER — Encounter: Payer: Self-pay | Admitting: Internal Medicine

## 2019-01-14 ENCOUNTER — Other Ambulatory Visit (HOSPITAL_COMMUNITY): Payer: Self-pay | Admitting: Interventional Radiology

## 2019-01-14 DIAGNOSIS — I771 Stricture of artery: Secondary | ICD-10-CM

## 2019-01-14 MED ORDER — FAMOTIDINE 20 MG PO TABS: 20.0000 mg | ORAL_TABLET | Freq: Two times a day (BID) | ORAL | 1 refills | Status: DC

## 2019-01-18 ENCOUNTER — Encounter: Payer: Self-pay | Admitting: Internal Medicine

## 2019-01-28 ENCOUNTER — Ambulatory Visit (HOSPITAL_COMMUNITY)
Admission: RE | Admit: 2019-01-28 | Discharge: 2019-01-28 | Disposition: A | Discharge status: Home / Self Care | Payer: PPO | Source: Ambulatory Visit | Attending: Interventional Radiology | Admitting: Interventional Radiology

## 2019-01-28 ENCOUNTER — Other Ambulatory Visit: Payer: Self-pay

## 2019-01-28 DIAGNOSIS — I771 Stricture of artery: Secondary | ICD-10-CM

## 2019-01-28 DIAGNOSIS — I6522 Occlusion and stenosis of left carotid artery: Secondary | ICD-10-CM | POA: Diagnosis not present

## 2019-01-28 NOTE — Progress Notes (Signed)
Chief Complaint: Patient was seen in consultation today for left ICA stenosis and left subclavian artery stenosis s/p revascularization.  Referring Physician(s): Garvin Fila  Supervising Physician: Luanne Bras  Patient Status: [facility] - Out-[facility]  History of Present Illness: Susan Gillespie is a 73 y.o. female with a past medical history as below, with pertinent past medical history including hypertension, hyperlipidemia, SVT, chronic diastolic HF, CAD, CVA 0000000, migraines, melanoma, and anxiety. She is known to [facility] and has been followed by Dr. Estanislado Pandy since 09/2014. She first presented to our department at the request of Dr. Leonie Man for management of vertebrobasilar insufficiency/right vertebral artery stenosis/subclavian steal syndrome along with left ICA stenosis. She underwent an image-guided cerebral arteriogram with revascularization of her left subclavian artery stenosis using stent assisted angioplasty 11/09/2014 by Dr. Estanislado Pandy. She was followed with routine imaging scans to monitor for changes and was unfortunately found to have intra-stent stenosis. She then underwent an image-guided cerebral arteriogram with revascularization of left subclavian artery intra-stent stenosis using stent assisted angioplasty 05/31/2015 by Dr. Estanislado Pandy. She was again followed with routine imaging scans to monitor for changes and again was found to have intra-stent stenosis. She then underwent an image-guided cerebral arteriogram with revascularization of left subclavian artery intra-stent stenosis using balloon angioplasty 01/05/2016 by Dr. Estanislado Pandy. She has since been followed with routine imaging scans to monitor for changes.  Carotid US 01/09/2019: 1. Right Carotid: Velocities in the right ICA are consistent with a 1-39% stenosis. No significant change since study done 06/12/18. 2. Left Carotid: Velocities in the left ICA are consistent with a 60-79% stenosis. Velocities elevated since study done  06/12/18. Diastolic velocity now places stenosis in 60-79% range. 3. Vertebrals:  Right vertebral artery demonstrates antegrade flow. Left vertebral artery demonstrates retrograde flow. 4. Subclavians: Normal flow hemodynamics were seen in bilateral subclavian arteries. Could not match prior subclavian velocity.  Patient presents today for follow-up to discuss findings of recent carotid US 01/09/2019. Patient awake and alert sitting in chair. Complains of intermittent dizziness. States that she gets dizzy while rolling from her left to right side while laying in bed, unchanged (states it does not occur when she rolls from right to left). States that she has also had intermittent dizziness that lasts 3-5 seconds while sitting. States she sometimes has associated nausea with dizziness, no vomiting or syncope. States this "dizziness at rest" is new for her. Complains of intermittent lightheadedness when looking up, unchanged. Denies headache, weakness, numbness/tingling, vision changes, hearing changes, tinnitus, or speech difficulty.  Currently taking Plavix 75 mg once daily and Aspirin 325 mg once daily.   Past Medical History:  Diagnosis Date   Anxiety    Artery stenosis (HCC)    left internal carotid, left subclavian   Arthritis    Bleeding tendency ([place]) 05/16/2012   Hx post op bleeding (epidural hematoma s/p ACDF '10; LLQ hematoma due to superficial vessel fascial bleed post colon resection; Normal platelet count; saw Dr. [name] '14   CAD in native artery    a. mod to mod-severe CAD by cath 10/2016, med rx.   Carotid stenosis, bilateral 08/31/2016   1-39% right and 40-59% left ICA stenosis by dopplers 05/2016   Chronic diastolic CHF (congestive heart failure) (HCC)    Colon polyp    Diverticulitis    Family history of cardiovascular disease    Fracture of right fibula    GERD (gastroesophageal reflux disease)    Headache(784.0)    mirgraines - history of  Hearing aid  worn    B/L   HOH (hard of hearing)    wears hearing aids   Hyperlipidemia    Hypertension    IBS (irritable bowel syndrome)    followed by Dr. [name]   Liver cyst    Melanoma ([place])    removed from back 04/2013   Shortness of breath dyspnea    with exertion   Stroke ([place]) 06/2012   mild   SVT (supraventricular tachycardia) ([place])    Urinary urgency    UTI (lower urinary tract infection)    x 2 since June 2016   Wears glasses     Past Surgical History:  Procedure Laterality Date   ABDOMINAL HYSTERECTOMY     ANGIOPLASTY     stent in subclavian LEFT 2016, angioplasty of stent 2017   APPENDECTOMY     AUGMENTATION MAMMAPLASTY     BREAST EXCISIONAL BIOPSY Left    benign   BREAST SURGERY     BIL breast augmentation   CHOLECYSTECTOMY     COLON SURGERY     Sigmoid Colectomy, returned for post op bleeding   COLONOSCOPY W/ POLYPECTOMY     COLONOSCOPY WITH PROPOFOL N/A 05/05/2016   Procedure: COLONOSCOPY WITH PROPOFOL;  Surgeon: Alphonsa Overall, MD;  Location: [facility];  Service: General;  Laterality: N/A;   ESOPHAGOGASTRODUODENOSCOPY (EGD) WITH PROPOFOL N/A 05/05/2016   Procedure: ESOPHAGOGASTRODUODENOSCOPY (EGD) WITH PROPOFOL;  Surgeon: Alphonsa Overall, MD;  Location: [facility];  Service: General;  Laterality: N/A;   EYE SURGERY     eyelid drooping fixed   IR ANGIO INTRA EXTRACRAN SEL COM CAROTID INNOMINATE BILAT MOD SED  10/16/2018   IR ANGIO VERTEBRAL SEL SUBCLAVIAN INNOMINATE UNI L MOD SED  10/16/2018   IR ANGIO VERTEBRAL SEL VERTEBRAL UNI R MOD SED  10/16/2018   IR GENERIC HISTORICAL  12/24/2015   IR ANGIO INTRA EXTRACRAN SEL COM CAROTID INNOMINATE BILAT MOD SED 12/24/2015 Luanne Bras, MD [facility]   IR GENERIC HISTORICAL  12/24/2015   IR ANGIOGRAM EXTREMITY LEFT 12/24/2015 Luanne Bras, MD [facility]   IR GENERIC HISTORICAL  12/24/2015   IR ANGIO VERTEBRAL SEL VERTEBRAL UNI R MOD SED 12/24/2015 Luanne Bras, MD [facility]   IR GENERIC  HISTORICAL  01/05/2016   IR PTA NON CORO-LOWER EXTREM 01/05/2016 Luanne Bras, MD [facility]   IR GENERIC HISTORICAL  03/07/2016   IR RADIOLOGIST EVAL & MGMT 03/07/2016 [facility]   JOINT REPLACEMENT Left    thumb   LEFT HEART CATH AND CORONARY ANGIOGRAPHY N/A 11/10/2016   Procedure: Left Heart Cath and Coronary Angiography;  Surgeon: Belva Crome, MD;  Location: [facility];  Service: Cardiovascular;  Laterality: N/A;   MASTOID DEBRIDEMENT     MASTOIDECTOMY REVISION     RADIOLOGY WITH ANESTHESIA N/A 11/09/2014   Procedure: STENT PLACEMENT;  Surgeon: Luanne Bras, MD;  Location: [place];  Service: Radiology;  Laterality: N/A;   RADIOLOGY WITH ANESTHESIA N/A 05/31/2015   Procedure: RADIOLOGY WITH ANESTHESIA;  Surgeon: Luanne Bras, MD;  Location: [place];  Service: Radiology;  Laterality: N/A;   RADIOLOGY WITH ANESTHESIA N/A 01/05/2016   Procedure: RADIOLOGY WITH ANESTHESIA ANGIOPLASTY WITH STENTNG;  Surgeon: Luanne Bras, MD;  Location: [place];  Service: Radiology;  Laterality: N/A;   RADIOLOGY WITH ANESTHESIA N/A 10/16/2018   Procedure: STENTING;  Surgeon: Luanne Bras, MD;  Location: [place];  Service: Radiology;  Laterality: N/A;   skin cancer excised  04/2013   Melonoma   SPINE  SURGERY     cervical fusion, returned to OR for post op  bleeding   TONSILLECTOMY     TONSILLECTOMY     TUBAL LIGATION     VAGINA SURGERY     anterior posterior repair    Allergies: Phenergan [promethazine hcl], Daypro [oxaprozin], Lisinopril, Other, Simvastatin, and Tramadol  Medications: Prior to Admission medications   Medication Sig Start Date End Date Taking? Authorizing Provider  acetaminophen (TYLENOL) 650 MG CR tablet Take 1,300 mg by mouth every 8 (eight) hours as needed for pain.     [provider]  ALPRAZolam (XANAX) 0.5 MG tablet TAKE 0.5 TABLETS (0.25 MG TOTAL) BY MOUTH AT BEDTIME AS NEEDED FOR ANXIETY OR SLEEP. 01/09/19   Binnie Rail, MD    aspirin 325 MG tablet Take 1 tablet (325 mg total) by mouth daily. 11/10/14   Tsosie Billing D, PA-C  celecoxib (CELEBREX) 200 MG capsule Take 200 mg by mouth daily as needed for mild pain.    [provider]  cetirizine (ZYRTEC) 10 MG tablet Take 10 mg by mouth every morning.     [provider]  clopidogrel (PLAVIX) 75 MG tablet TAKE 1 TABLET BY MOUTH EVERY DAY Patient taking differently: Take 75 mg by mouth daily.  10/17/18   Binnie Rail, MD  conjugated estrogens (PREMARIN) vaginal cream 1 applicator full into vagina twice a week 10/30/18   Binnie Rail, MD  cyclobenzaprine (FLEXERIL) 5 MG tablet Take 1 tablet (5 mg total) by mouth 3 (three) times daily as needed for muscle spasms. 12/31/18   Binnie Rail, MD  diltiazem (CARDIZEM CD) 180 MG 24 hr capsule TAKE 1 CAPSULE BY MOUTH EVERY DAY Patient taking differently: Take 180 mg by mouth at bedtime.  05/01/18   Camnitz, Ocie Doyne, MD  famotidine (PEPCID) 20 MG tablet Take 1 tablet (20 mg total) by mouth 2 (two) times daily. 01/14/19   Binnie Rail, MD  famotidine (PEPCID) 40 MG tablet TAKE 1 TABLET BY MOUTH EVERYDAY AT BEDTIME Patient taking differently: Take 40 mg by mouth at bedtime.  07/22/18   Binnie Rail, MD  ferrous sulfate 325 (65 FE) MG tablet Take 325 mg by mouth daily with breakfast.    [provider]  furosemide (LASIX) 20 MG tablet TAKE 1 TABLET BY MOUTH EVERY DAY Patient taking differently: Take 20 mg by mouth daily.  04/29/18   Dunn, Nedra Hai, PA-C  loperamide (IMODIUM A-D) 2 MG tablet Take 2 mg by mouth daily.    [provider]  losartan (COZAAR) 50 MG tablet TAKE 1 TABLET BY MOUTH EVERY DAY 01/09/19   Binnie Rail, MD  Magnesium 250 MG TABS Take 250 mg by mouth daily.    [provider]  multivitamin-lutein (OCUVITE-LUTEIN) CAPS capsule Take 1 capsule by mouth daily.    [provider]  nitroGLYCERIN (NITROSTAT) 0.4 MG SL tablet Place 1 tablet (0.4 mg total) under the  tongue every 5 (five) minutes as needed for chest pain. 11/10/16 10/10/18  Eileen Stanford, PA-C  oxyCODONE-acetaminophen (PERCOCET) 5-325 MG tablet Take 1-2 tablets by mouth every 6 (six) hours as needed. 12/24/18   Veryl Speak, MD  potassium chloride (K-DUR) 10 MEQ tablet TAKE 1 TABLET BY MOUTH EVERY DAY 01/09/19   Binnie Rail, MD  rosuvastatin (CRESTOR) 20 MG tablet TAKE 1 TABLET BY MOUTH EVERYDAY AT BEDTIME 01/09/19   Burns, Claudina Lick, MD  SUMAtriptan (IMITREX) 50 MG tablet Take 50 mg by  mouth every 2 (two) hours as needed for migraine. May repeat in 2 hours if headache persists or recurs.    [provider]  traZODone (DESYREL) 50 MG tablet TAKE 1 TABLET BY MOUTH EVERYDAY AT BEDTIME 01/09/19   Burns, Claudina Lick, MD  vitamin B-12 (CYANOCOBALAMIN) 1000 MCG tablet Take 1,000 mcg by mouth daily.    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[employer], retired  Scientist, product/process development strain: Not on file   Food insecurity    Worry: Not on file    Inability: Not on Lexicographer needs    Medical: Not on file    Non-medical: Not on file  Tobacco Use   Smoking status: Former Smoker    Packs/day: 2.00    Years: 22.00    Pack years: 44.00    Types: Cigarettes    Quit date: 04/23/1985    Years since quitting: 33.7   Smokeless tobacco: Never Used  Substance and Sexual Activity    Alcohol use: Yes    Alcohol/week: 4.0 standard drinks    Types: 4 Glasses of wine per week    Comment: 1 drink a  day   Drug use: No   Sexual activity: Yes    Birth control/protection: Surgical  Lifestyle   Physical activity    Days per week: Not on file    Minutes per session: Not on file   Stress: Not on file  Relationships   Social connections    Talks on phone: Not on file    Gets together: Not on file    Attends religious service: Not on file    Active member of club or organization: Not on file    Attends meetings of clubs or organizations: Not on file    Relationship status: Not on file  Other Topics Concern   Not on file  Social History Narrative   ** Merged History Encounter **       Domestic partner   Regular exercise-no   Right handed   Caffeine use-- 2 cups coffee daily, rare soda     Review of Systems: A 12 point ROS discussed and pertinent positives are indicated in the HPI above.  All other systems are negative.  Review of Systems  Constitutional: Negative for chills and fever.  HENT: Negative for hearing loss and tinnitus.   Eyes: Negative for visual disturbance.  Respiratory: Negative for shortness of breath and wheezing.   Cardiovascular: Negative for chest pain and palpitations.  Gastrointestinal: Positive for nausea. Negative for vomiting.  Neurological: Positive for dizziness and light-headedness. Negative for syncope, speech difficulty, weakness, numbness and headaches.  Psychiatric/Behavioral: Negative for behavioral problems and confusion.  Vital Signs: BP 132/62 (BP Location: Right Arm, Sitting) BP 120/70 (BP Location: Left Arm, Sitting) BP 142/70 (BP Location: Right Arm, Standing) BP 128/78 (BP Location: Left Arm, Standing) LMP  (LMP Unknown)    Physical Exam Vitals signs reviewed.  Constitutional:      General: She is not in acute distress.    Appearance: Normal appearance.  Cardiovascular:     Comments: Radial pulse 2+  on right and 1+ on left. Pulmonary:     Effort: Pulmonary effort is normal. No respiratory distress.  Skin:    General: Skin is warm and dry.  Neurological:     Mental Status: She is alert and oriented to person, place, and time.  Psychiatric:        Mood and Affect: Mood normal.        Behavior: Behavior normal.        Thought Content: Thought content normal.        Judgment: Judgment normal.      Imaging: Dg Bone Density  Result Date: 01/13/2019 EXAM: DUAL X-RAY ABSORPTIOMETRY (DXA) FOR BONE MINERAL DENSITY IMPRESSION: Referring Physician:  [place] Your patient completed a BMD test using Lunar IDXA DXA system ( analysis version: 16 ) manufactured by [employer]. Technologist: AW PATIENT: Name: Zariyha, Cholico Patient ID: [id number] Birth Date: [birth year] Height: 63.0 in. Sex: Female Measured: 01/13/2019 Weight: 154.8 lbs. Indications: Advanced Age, Caucasian, Desyrel, Estrogen Deficient, Family Hist. (Parent hip fracture), Hysterectomy, Pepcid, Postmenopausal Fractures: Tibia Treatments: Premarin vaginal cream ASSESSMENT: The BMD measured at AP Spine L1-L4 is 1.100 g/cm2 with a T-score of -0.7. This patient is considered normal according to [facility]) criteria. The scan quality is good. Site Region Measured Date Measured Age YA BMD Significant CHANGE T-score AP Spine  L1-L4      01/13/2019    [age]         -0.5    1.100 g/cm2 DualFemur Neck Left  01/13/2019    74.7         -0.3    0.997 g/cm2 DualFemur Total Mean 01/13/2019    74.7         0.3     1.050 g/cm2 World Health Organization [facility]) criteria for post-menopausal, Caucasian Women: Normal       T-score at or above -1 SD Osteopenia   T-score between -1 and -2.5 SD Osteoporosis T-score at or below -2.5 SD RECOMMENDATION: 1. All patients should optimize calcium and vitamin D intake. 2. Consider FDA approved medical therapies in postmenopausal women and men aged 33 years and older, based on the following: a. A hip or  vertebral (clinical or morphometric) fracture b. T- score < or = -2.5 at the femoral neck or spine after appropriate evaluation to exclude secondary causes c. Low bone mass (T-score between -1.0 and -2.5 at the femoral neck or spine) and a 10 year probability of a hip fracture > or = 3% or a 10 year probability of a major osteoporosis-related fracture > or = 20% based on the [place]-adapted WHO algorithm d. Clinician judgment and/or patient preferences may indicate treatment for people with 10-year fracture probabilities above or below these levels FOLLOW-UP: Patients with diagnosis of osteoporosis or at high risk for fracture should have regular bone mineral density tests. For patients eligible for Medicare, routine testing is allowed once every 2 years. The testing frequency can be increased to one year for patients who have rapidly progressing disease, those who are receiving or discontinuing medical  therapy to restore bone mass, or have additional risk factors. I have reviewed this report and agree with the above findings. [facility] Radiology Electronically Signed   By: Lowella Grip III M.D.   On: 01/13/2019 15:11   Mm 3d Screen Breast W/implant Bilateral  Result Date: 01/14/2019 CLINICAL DATA:  Screening. EXAM: DIGITAL SCREENING BILATERAL MAMMOGRAM WITH IMPLANTS, CAD AND TOMO The patient has retropectoral implants. Standard and implant displaced views were performed. COMPARISON:  Previous exam(s). ACR Breast Density Category c: The breast tissue is heterogeneously dense, which may obscure small masses. FINDINGS: There are no findings suspicious for malignancy. Images were processed with CAD. IMPRESSION: No mammographic evidence of malignancy. A result letter of this screening mammogram will be mailed directly to the patient. RECOMMENDATION: Screening mammogram in one year. (Code:SM-B-01Y) BI-RADS CATEGORY  1:  Negative. Electronically Signed   By: Lajean Manes M.D.   On: 01/14/2019 08:20   Vas US  Carotid  Result Date: 01/11/2019 Carotid Arterial Duplex Study Risk Factors:      Hypertension, hyperlipidemia, prior CVA. Other Factors:     History of carotid and subclavian stenosis. Stent to left                    subclavian. Comparison Study:  Prior study on file from 06/12/18 is on file for comparison Performing Technologist: Sharion Dove RVS  Examination Guidelines: A complete evaluation includes B-mode imaging, spectral Doppler, color Doppler, and power Doppler as needed of all accessible portions of each vessel. Bilateral testing is considered an integral part of a complete examination. Limited examinations for reoccurring indications may be performed as noted.  Right Carotid Findings: +----------+--------+--------+--------+------------------+------------------+             PSV cm/s EDV cm/s Stenosis Plaque Description Comments            +----------+--------+--------+--------+------------------+------------------+  CCA Prox   58       12                                   intimal thickening  +----------+--------+--------+--------+------------------+------------------+  CCA Distal 79       14                                   intimal thickening  +----------+--------+--------+--------+------------------+------------------+  ICA Prox   140      35       1-39%    calcific           Shadowing           +----------+--------+--------+--------+------------------+------------------+  ICA Mid    121      31                                                       +----------+--------+--------+--------+------------------+------------------+  ICA Distal 109      28                                                       +----------+--------+--------+--------+------------------+------------------+  ECA  136      20                                                       +----------+--------+--------+--------+------------------+------------------+ +----------+--------+-------+--------+-------------------+              PSV cm/s EDV cms Describe Arm Pressure (mmHG)  +----------+--------+-------+--------+-------------------+  Subclavian 171                                            +----------+--------+-------+--------+-------------------+ +---------+--------+--+--------+--+---------+  Vertebral PSV cm/s 78 EDV cm/s 18 Antegrade  +---------+--------+--+--------+--+---------+  Left Carotid Findings: +----------+--------+--------+--------+------------------+------------------+             PSV cm/s EDV cm/s Stenosis Plaque Description Comments            +----------+--------+--------+--------+------------------+------------------+  CCA Prox   95       18                                   intimal thickening  +----------+--------+--------+--------+------------------+------------------+  CCA Distal 73       17                                   intimal thickening  +----------+--------+--------+--------+------------------+------------------+  ICA Prox   276      66       60-79%                      Shadowing           +----------+--------+--------+--------+------------------+------------------+  ICA Mid    216      38                                                       +----------+--------+--------+--------+------------------+------------------+  ICA Distal 119      27                                                       +----------+--------+--------+--------+------------------+------------------+  ECA        519      63                calcific                               +----------+--------+--------+--------+------------------+------------------+ +----------+--------+--------+--------+-------------------+             PSV cm/s EDV cm/s Describe Arm Pressure (mmHG)  +----------+--------+--------+--------+-------------------+  Subclavian 113                                             +----------+--------+--------+--------+-------------------+ +---------+--------+--------+----------+  Vertebral PSV cm/s EDV cm/s Retrograde   +---------+--------+--------+----------+ Could not match prior subclavian velocity Summary: Right Carotid: Velocities in the right ICA are consistent with a 1-39% stenosis.                No significant change since study done 06/12/18. Left Carotid: Velocities in the left ICA are consistent with a 60-79% stenosis.               Velocities elevated since study done 06/12/18. Diastolic velocity               now places stenosis in 60-79% range. Vertebrals:  Right vertebral artery demonstrates antegrade flow. Left vertebral              artery demonstrates retrograde flow. Subclavians: Normal flow hemodynamics were seen in bilateral subclavian              arteries. Could not match prior subclavian velocity. *See table(s) above for measurements and observations.  Electronically signed by Curt Jews MD on 01/11/2019 at 4:08:02 AM.    Final     Labs:  CBC: Recent Labs    04/05/18 1051 10/16/18 0647  WBC 5.2 4.1  HGB 12.2 11.4*  HCT 35.8* 34.9*  PLT 270.0 241    COAGS: Recent Labs    10/16/18 0647  INR 1.0  APTT 31    BMP: Recent Labs    04/05/18 1051 09/30/18 1522 10/16/18 0647 10/30/18 0852  NA 138  --  140 139  K 4.0  --  3.8 4.0  CL 103  --  107 104  CO2 28  --  26 27  GLUCOSE 109*  --  110* 111*  BUN 16  --  15 13  CALCIUM 9.6  --  9.5 9.8  CREATININE 0.95 1.00 1.01* 1.02  GFRNONAA  --   --  55*  --   GFRAA  --   --  >60  --     LIVER FUNCTION TESTS: Recent Labs    04/05/18 1051 10/30/18 0852  BILITOT 0.3 0.5  AST 16 14  ALT 12 12  ALKPHOS 38* 71  PROT 6.8 6.8  ALBUMIN 4.3 4.5     Assessment and Plan:  Left ICA stenosis and left subclavian artery stenosis. Left subclavian artery stenosis s/p revascularization using stent assisted angioplasty (two stents) 11/09/2014 by Dr. Estanislado Pandy; left subclavian artery intra-stent stenosis s/p revascularization using balloon angioplasty 01/05/2016 by Dr. Estanislado Pandy.  Dr. Estanislado Pandy was present for consultation. Discussed  results of recent carotid US 01/09/2019. Explained that this is an indirect test to evaluate stenosis, but based on results it is indicative of worsening stenosis of her left ICA. Discussed patient's current symptoms. Patient states that her symptoms are stable, however she is worried that she has intermittent dizziness at rest. Informed patient that based on symptoms/radial pulse check (less prominent on left side), it could be indicative of worsening left subclavian intra-stent stenosis. Explained that there are two management options moving forward- either continued conservative management including DAPT (Plavix and Aspirin) and routine imaging scans to monitor for changes, or with an image-guided diagnostic cerebral arteriogram with intent to treat left ICA stenosis OR left subclavian intra-stent stenosis (depending which stenosis is more severe on cerebral arteriogram). Based on patient's symptoms, recommend continued conservative management at this time (DAPT and carotid US in 3 months). Instructed patient to call us if symptoms change/worsen- will proceed with possible image-guided diagnostic cerebral arteriogram at that time.  Plan for  follow-up with carotid US 3 months from prior 01/09/2019. Informed patient that our schedulers will call her to set up this imaging scan. Instructed patient to continue taking Plavix 75 mg once daily and Aspirin 325 mg once daily.  All questions answered and concerns addressed. Patient conveys understanding and agrees with plan.  Thank you for this interesting consult.  I greatly enjoyed meeting Wreatha Jong Breault and look forward to participating in their care.  A copy of this report was sent to the requesting provider on this date.  Electronically Signed: Earley Abide, PA-C 01/28/2019, 11:57 AM   I spent a total of 40 Minutes in face to face in clinical consultation, greater than 50% of which was counseling/coordinating care for left ICA stenosis and left  subclavian artery stenosis s/p revascularization.

## 2019-02-01 ENCOUNTER — Other Ambulatory Visit: Payer: Self-pay | Admitting: Internal Medicine

## 2019-03-11 DIAGNOSIS — H353121 Nonexudative age-related macular degeneration, left eye, early dry stage: Secondary | ICD-10-CM | POA: Diagnosis not present

## 2019-03-11 DIAGNOSIS — H52223 Regular astigmatism, bilateral: Secondary | ICD-10-CM | POA: Diagnosis not present

## 2019-03-11 DIAGNOSIS — H353112 Nonexudative age-related macular degeneration, right eye, intermediate dry stage: Secondary | ICD-10-CM | POA: Diagnosis not present

## 2019-03-19 ENCOUNTER — Other Ambulatory Visit: Payer: Self-pay | Admitting: Internal Medicine

## 2019-03-19 MED ORDER — PREMARIN 0.625 MG/GM VA CREA: TOPICAL_CREAM | VAGINAL | 1 refills | Status: DC

## 2019-03-19 NOTE — Telephone Encounter (Signed)
Copied from [place] [id number]. Topic: Quick Communication - Rx Refill/Question >> Mar 19, 2019  9:48 AM Rainey Pines A wrote: Medication: conjugated estrogens (PREMARIN) vaginal cream (Patient requesting 3 month supply due to insurance.)  Has the patient contacted their pharmacy? {Yes (Agent: If no, request that the patient contact the pharmacy for the refill.) (Agent: If yes, when and what did the pharmacy advise?)Contact PCP  Preferred Pharmacy (with phone number or street name): [id number] - [place], [address] [phone] (Phone) [phone] (Fax)    Agent: Please be advised that RX refills may take up to 3 business days. We ask that you follow-up with your pharmacy.

## 2019-03-19 NOTE — Telephone Encounter (Signed)
Requested medication (s) are due for refill today: no  Requested medication (s) are on the active medication list: yes  Last refill:  10/30/2018  Future visit scheduled: yes  Notes to clinic:  Requesting a 3 month supply because of insurance    Requested Prescriptions  Pending Prescriptions Disp Refills   conjugated estrogens (PREMARIN) vaginal cream 30 g 1    Sig: 1 applicator full into vagina twice a week     OB/GYN:  Estrogens Passed - 03/19/2019 10:03 AM      Passed - Mammogram is up-to-date per Health Maintenance      Passed - Last BP in normal range    BP Readings from Last 1 Encounters:  01/28/19 128/78         Passed - Valid encounter within last 12 months    Recent Outpatient Visits          4 months ago Preventative health care   [place], MD   11 months ago Essential hypertension, benign   [place], MD   1 year ago Hyperlipidemia LDL goal <70   [place], Claudina Lick, MD   5 years ago Prediabetes   [place], Thomas L, MD   6 years ago Low back pain radiating to both legs   [place], MD      Future Appointments            In 1 week Turner, Eber Hong, MD [place], [facility]   In 1 month Burns, Claudina Lick, MD [place], [place]

## 2019-04-01 ENCOUNTER — Encounter: Payer: Self-pay | Admitting: Internal Medicine

## 2019-04-01 ENCOUNTER — Ambulatory Visit: Payer: PPO | Admitting: Cardiology

## 2019-04-01 NOTE — Progress Notes (Deleted)
Cardiology Office Note:    Date:  04/01/2019   ID:  Susan Gillespie, DOB 08-18-1945, MRN [id number]  PCP:  Binnie Rail, MD  Cardiologist:  Fransico Him, MD    Referring MD: Binnie Rail, MD   No chief complaint on file.   History of Present Illness:    Susan Gillespie is a 73 y.o. female with a hx of  paroxysmal supraventricular tachycardia. She also has a history of left subclavian steal. She underwent stenting by Dr. Judi Cong and then repeat angioplasty on 01/05/2016 down to 80-85% patency.Dopplers 05/2017 with > 50% stenosis with patent stent in the left subclavian artery.  Repeat dopplers in 8/ 2019 showed 1-39% bilateral carotid stenosis with no mention of subclavian artery flow.    Cardiac CTfor SOB7/12/18 showed calcium score 92nd percentile for age/sex, significant calcific 3V disease with possibly hemodynamically significant stenosis in mid RCA and mLAD; subsequent FFR overread showed functionally significant D1 disease. She underwent LHC 11/10/16 showing moderate to moderately severe 3v calcific disease with anatomy and obstruction similar to that reported on cor CTA - Proximal 50-60% LAD, 70% proximal first diagonal, 50-60% prox-mid Cx ostial 60% and mid 50% RCa, normal LV function with elevated EDP c/w chronic diastolic CHF. She is now on medical management.   She is here today for followup and is doing well.  She denies any chest pain or pressure, SOB, DOE, PND, orthopnea, LE edema, dizziness, palpitations or syncope. She is compliant with her meds and is tolerating meds with no SE.     Past Medical History:  Diagnosis Date  . Anxiety   . Artery stenosis (HCC)    left internal carotid, left subclavian  . Arthritis   . Bleeding tendency ([place]) 05/16/2012   Hx post op bleeding (epidural hematoma s/p ACDF '10; LLQ hematoma due to superficial vessel fascial bleed post colon resection; Normal platelet count; saw Dr. [name] '14  . CAD in native artery    a. mod to  mod-severe CAD by cath 10/2016, med rx.  . Carotid stenosis, bilateral 08/31/2016   1-39% right and 40-59% left ICA stenosis by dopplers 05/2016  . Chronic diastolic CHF (congestive heart failure) ([place])   . Colon polyp   . Diverticulitis   . Family history of cardiovascular disease   . Fracture of right fibula   . GERD (gastroesophageal reflux disease)   . Headache(784.0)    mirgraines - history of  . Hearing aid worn    B/L  . HOH (hard of hearing)    wears hearing aids  . Hyperlipidemia   . Hypertension   . IBS (irritable bowel syndrome)    followed by Dr. [name]  . Liver cyst   . Melanoma ([place])    removed from back 04/2013  . Shortness of breath dyspnea    with exertion  . Stroke ([place]) 06/2012   mild  . SVT (supraventricular tachycardia) ([place])   . Urinary urgency   . UTI (lower urinary tract infection)    x 2 since June 2016  . Wears glasses     Past Surgical History:  Procedure Laterality Date  . ABDOMINAL HYSTERECTOMY    . ANGIOPLASTY     stent in subclavian LEFT 2016, angioplasty of stent 2017  . APPENDECTOMY    . AUGMENTATION MAMMAPLASTY    . BREAST EXCISIONAL BIOPSY Left    benign  . BREAST SURGERY     BIL breast augmentation  . CHOLECYSTECTOMY    . COLON  SURGERY     Sigmoid Colectomy, returned for post op bleeding  . COLONOSCOPY W/ POLYPECTOMY    . COLONOSCOPY WITH PROPOFOL N/A 05/05/2016   Procedure: COLONOSCOPY WITH PROPOFOL;  Surgeon: Alphonsa Overall, MD;  Location: [facility];  Service: General;  Laterality: N/A;  . ESOPHAGOGASTRODUODENOSCOPY (EGD) WITH PROPOFOL N/A 05/05/2016   Procedure: ESOPHAGOGASTRODUODENOSCOPY (EGD) WITH PROPOFOL;  Surgeon: Alphonsa Overall, MD;  Location: [facility];  Service: General;  Laterality: N/A;  . EYE SURGERY     eyelid drooping fixed  . IR ANGIO INTRA EXTRACRAN SEL COM CAROTID INNOMINATE BILAT MOD SED  10/16/2018  . IR ANGIO VERTEBRAL SEL SUBCLAVIAN INNOMINATE UNI L MOD SED  10/16/2018  . IR ANGIO VERTEBRAL SEL VERTEBRAL UNI R  MOD SED  10/16/2018  . IR GENERIC HISTORICAL  12/24/2015   IR ANGIO INTRA EXTRACRAN SEL COM CAROTID INNOMINATE BILAT MOD SED 12/24/2015 Luanne Bras, MD [facility]  . IR GENERIC HISTORICAL  12/24/2015   IR ANGIOGRAM EXTREMITY LEFT 12/24/2015 Luanne Bras, MD [facility]  . IR GENERIC HISTORICAL  12/24/2015   IR ANGIO VERTEBRAL SEL VERTEBRAL UNI R MOD SED 12/24/2015 Luanne Bras, MD [facility]  . IR GENERIC HISTORICAL  01/05/2016   IR PTA NON CORO-LOWER EXTREM 01/05/2016 Luanne Bras, MD [facility]  . IR GENERIC HISTORICAL  03/07/2016   IR RADIOLOGIST EVAL & MGMT 03/07/2016 [facility]  . JOINT REPLACEMENT Left    thumb  . LEFT HEART CATH AND CORONARY ANGIOGRAPHY N/A 11/10/2016   Procedure: Left Heart Cath and Coronary Angiography;  Surgeon: Belva Crome, MD;  Location: [place] [facility];  Service: Cardiovascular;  Laterality: N/A;  . MASTOID DEBRIDEMENT    . MASTOIDECTOMY REVISION    . RADIOLOGY WITH ANESTHESIA N/A 11/09/2014   Procedure: STENT PLACEMENT;  Surgeon: Luanne Bras, MD;  Location: [place];  Service: Radiology;  Laterality: N/A;  . RADIOLOGY WITH ANESTHESIA N/A 05/31/2015   Procedure: RADIOLOGY WITH ANESTHESIA;  Surgeon: Luanne Bras, MD;  Location: [facility];  Service: Radiology;  Laterality: N/A;  . RADIOLOGY WITH ANESTHESIA N/A 01/05/2016   Procedure: RADIOLOGY WITH ANESTHESIA ANGIOPLASTY WITH STENTNG;  Surgeon: Luanne Bras, MD;  Location: [place];  Service: Radiology;  Laterality: N/A;  . RADIOLOGY WITH ANESTHESIA N/A 10/16/2018   Procedure: STENTING;  Surgeon: Luanne Bras, MD;  Location: [place];  Service: Radiology;  Laterality: N/A;  . skin cancer excised  04/2013   Melonoma  . SPINE SURGERY     cervical fusion, returned to OR for post op  bleeding  . TONSILLECTOMY    . TONSILLECTOMY    . TUBAL LIGATION    . VAGINA SURGERY     anterior posterior repair    Current Medications: No outpatient medications have been marked as taking for the  04/01/19 encounter (Appointment) with Sueanne Margarita, MD.     Allergies:   Phenergan [promethazine hcl], Daypro [oxaprozin], Lisinopril, Other, Simvastatin, and Tramadol   Social History   Socioeconomic History  . Marital status: Divorced    Spouse name: Not on file  . Number of children: 2  . Years of education: 17  . Highest education level: Not on file  Occupational History  . 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 Smoking status: Former Smoker    Packs/day: 2.00    Years: 22.00    Pack years: 44.00    Types: Cigarettes  Quit date: 04/23/1985    Years since quitting: 33.9  . Smokeless tobacco: Never Used  Substance and Sexual Activity  . Alcohol use: Yes    Alcohol/week: 4.0 standard drinks    Types: 4 Glasses of wine per week    Comment: 1 drink a  day  . Drug use: No  . Sexual activity: Yes    Birth control/protection: Surgical  Other Topics Concern  . Not on file  Social History Narrative   ** Merged History Encounter **       Domestic partner   Regular exercise-no   Right handed   Caffeine use-- 2 cups coffee daily, rare soda   Social Determinants of Health   Financial Resource Strain:   . Difficulty of Paying Living Expenses: Not on file  Food Insecurity:   . Worried About Charity fundraiser in the Last Year: Not on file  . Ran Out of Food in the Last Year: Not on file  Transportation Needs:   . Lack of Transportation (Medical): Not on file  . Lack of Transportation (Non-Medical): Not on file  Physical Activity:   . Days of Exercise per Week: Not on file  . Minutes of Exercise per Session: Not on file  Stress:   . Feeling of Stress : Not on file  Social Connections:   . Frequency of Communication with Friends and Family: Not on file  . Frequency of Social Gatherings with Friends and Family: Not on file  . Attends Religious Services: Not on file  . Active Member of Clubs  or Organizations: Not on file  . Attends Archivist Meetings: Not on file  . Marital Status: Not on file     Family History: The patient's family history includes Cancer in an other family member; Heart attack in her brother and sister; Heart disease in her brother, father, and sister; Hyperlipidemia in an other family member; Hypertension in an other family member; Stroke in her son and another family member.  ROS:   Please see the history of present illness.    ROS  All other systems reviewed and negative.   EKGs/Labs/Other Studies Reviewed:    The following studies were reviewed today: none  EKG:  EKG is  ordered today.  The ekg ordered today demonstrates ***  Recent Labs: 04/05/2018: TSH 1.52 10/16/2018: Hemoglobin 11.4; Platelets 241 10/30/2018: ALT 12; BUN 13; Creatinine, Ser 1.02; Potassium 4.0; Sodium 139   Recent Lipid Panel    Component Value Date/Time   CHOL 150 10/30/2018 0852   CHOL 147 08/31/2016 0912   TRIG 223.0 (H) 10/30/2018 0852   HDL 63.30 10/30/2018 0852   HDL 78 08/31/2016 0912   CHOLHDL 2 10/30/2018 0852   VLDL 44.6 (H) 10/30/2018 0852   LDLCALC 49 04/05/2018 1051   LDLCALC 38 08/31/2016 0912   LDLDIRECT 50.0 10/30/2018 0852    Physical Exam:    VS:  LMP  (LMP Unknown)     Wt Readings from Last 3 Encounters:  12/24/18 155 lb (70.3 kg)  10/30/18 154 lb 1.9 oz (69.9 kg)  10/16/18 156 lb (70.8 kg)     GEN:  Well nourished, well developed in no acute distress HEENT: Normal NECK: No JVD; No carotid bruits LYMPHATICS: No lymphadenopathy CARDIAC: RRR, no murmurs, rubs, gallops RESPIRATORY:  Clear to auscultation without rales, wheezing or rhonchi  ABDOMEN: Soft, non-tender, non-distended MUSCULOSKELETAL:  No edema; No deformity  SKIN: Warm and dry NEUROLOGIC:  Alert and oriented x 3  PSYCHIATRIC:  Normal affect   ASSESSMENT:    1. CAD in native artery   2. Chronic diastolic CHF (congestive heart failure), NYHA class 1 (HCC)   3.  Carotid stenosis, bilateral   4. Paroxysmal supraventricular tachycardia ([place])   5. Essential hypertension, benign   6. 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HCC)   7. Hyperlipidemia LDL goal <70    PLAN:    In order of problems listed above:  1.  ASCAD -LHC 11/10/16 showed moderate to moderately severe 3v calcific disease with proximal 50-60% LAD, 70% proximal first diagonal, 50-60% prox-mid Cx ostial 60% and mid 50% RCA.  -she has not had any anginal sx -continue ASA 81mg  daily, Plavix 75mg  daily and statin  2.  Chronic diastolic CHF -her weight is stable and she has not SOB or LE edema -appears euvolemic on exam -continue Lasix 20mg  daily  3.  Carotid artery disease -dopplers 12/2018 showed 1-39% right and 60-79% left stenosis -repeat dopplers 12/2019 -continue statin and ASA  4.  PSVT -she has not had any palpitations since I saw her las -continue Cardizem CD 180mg  daily and lopressor PRN for breakthrough palpitations  5.  HTN -BP controlled -continue Cardizem CD 180mg  daily and Losartan 50mg  daily  6.  Subclavian artery stenosis -She has a history of left subclavian steal.  -s/p stenting by Dr. Judi Cong and then repeat angioplasty on 01/05/2016 down to 80-85% patency.  -carotid dopplers 12/2018 showed normal velocities in bilateral subclavians  7.  HLD -LDL goal < 70 -continue Crestor 20mg  daily   Medication Adjustments/Labs and Tests Ordered: Current medicines are reviewed at length with the patient today.  Concerns regarding medicines are outlined above.  No orders of the defined types were placed in this encounter.  No orders of the defined types were placed in this encounter.   Signed, Fransico Him, MD  04/01/2019 7:22 AM    [facility]

## 2019-04-02 ENCOUNTER — Other Ambulatory Visit: Payer: Self-pay

## 2019-04-03 ENCOUNTER — Other Ambulatory Visit: Payer: Self-pay | Admitting: Internal Medicine

## 2019-04-03 MED ORDER — PREMARIN 0.625 MG/GM VA CREA: TOPICAL_CREAM | VAGINAL | 1 refills | Status: DC

## 2019-04-08 ENCOUNTER — Other Ambulatory Visit (HOSPITAL_COMMUNITY): Payer: Self-pay | Admitting: Interventional Radiology

## 2019-04-08 ENCOUNTER — Telehealth (HOSPITAL_COMMUNITY): Payer: Self-pay

## 2019-04-08 DIAGNOSIS — I771 Stricture of artery: Secondary | ICD-10-CM

## 2019-04-08 NOTE — Telephone Encounter (Signed)
Called to schedule carotid ultrasound, no answer, left vm. AW

## 2019-04-23 ENCOUNTER — Other Ambulatory Visit: Payer: Self-pay

## 2019-04-23 ENCOUNTER — Ambulatory Visit (HOSPITAL_COMMUNITY)
Admission: RE | Admit: 2019-04-23 | Discharge: 2019-04-23 | Disposition: A | Discharge status: Home / Self Care | Payer: PPO | Source: Ambulatory Visit | Attending: Interventional Radiology | Admitting: Interventional Radiology

## 2019-04-23 DIAGNOSIS — I771 Stricture of artery: Secondary | ICD-10-CM | POA: Diagnosis not present

## 2019-04-23 NOTE — Progress Notes (Signed)
Carotid artery duplex completed.  Preliminary results can be found under CV proc under chart review.  04/23/2019 3:07 PM  Judeth Cornfield Kaysea Raya, RDMS, RVT

## 2019-04-28 ENCOUNTER — Telehealth (HOSPITAL_COMMUNITY): Payer: Self-pay

## 2019-04-28 NOTE — Telephone Encounter (Signed)
Pt agreed to f/u in 3 months with US carotid. Pt also wanted me to let Dr. Estanislado Pandy know that she has not had any dizzy spells or trouble when rolling over to her R side in the past 2 months. She was very excited on the phone call. I have sent a message to let Deveshwar/Ally know. AW

## 2019-05-01 NOTE — Patient Instructions (Addendum)
  Blood work was ordered.   ° ° °Medications reviewed and updated.  Changes include :   none ° ° ° °Please followup in 6 months ° ° °

## 2019-05-01 NOTE — Progress Notes (Signed)
Subjective:    Patient ID: Susan Gillespie, female    DOB: 1945/12/30, 74 y.o.   MRN: [id number]  HPI The patient is here for follow up of their chronic medical problems, including CAD, CHF, hypertension, hyperlipidemia, SVT, prediabetes, neck pain, OA, IBS, HRT, insomnia, anxiety.   She is taking all of her medications as prescribed.   She is not exercising regularly but is very active.  She tries to walk around neighborhood every Sunday.    She is doing well overall.  She has no concerns.     Medications and allergies reviewed with patient and updated if appropriate.  Patient Active Problem List   Diagnosis Date Noted  . Post menopausal syndrome 10/09/2017  . Migraine without status migrainosus, not intractable 10/09/2017  . Spondylosis of cervical region without myelopathy or radiculopathy 10/09/2017  . Insomnia 10/09/2017  . IBS (irritable bowel syndrome) 10/09/2017  . Chronic diastolic CHF (congestive heart failure), NYHA class 1 ([place]) 02/20/2017  . CAD in native artery 11/10/2016  . Carotid stenosis, bilateral 08/31/2016  . SOB (shortness of breath) 08/31/2016  . 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[place]) 01/05/2016  . Pernicious anemia 09/15/2013  . B12 deficiency anemia 09/15/2013  . Situational anxiety 12/20/2012  . Spinal stenosis of lumbar region 07/05/2012  . Cerebral infarction ([place]) 06/28/2012  . Bleeding tendency ([place]) 05/16/2012  . Paroxysmal supraventricular tachycardia ([place]) 09/25/2011  . Essential hypertension, benign 08/22/2010  . Prediabetes 04/28/2009  . Hyperlipidemia LDL goal <70 12/16/2008  . GERD 12/16/2008  . Osteoarthritis 12/16/2008    Current Outpatient Medications on File Prior to Visit  Medication Sig Dispense Refill  . acetaminophen (TYLENOL) 650 MG CR tablet Take 1,300 mg by mouth every 8 (eight) hours as needed for pain.     Marland Kitchen ALPRAZolam (XANAX) 0.5 MG tablet TAKE 0.5 TABLETS (0.25 MG TOTAL) BY MOUTH AT BEDTIME AS NEEDED FOR ANXIETY OR SLEEP.  30 tablet 0  . aspirin 325 MG tablet Take 1 tablet (325 mg total) by mouth daily. 30 tablet 0  . celecoxib (CELEBREX) 200 MG capsule Take 200 mg by mouth daily as needed for mild pain.    . cetirizine (ZYRTEC) 10 MG tablet Take 10 mg by mouth every morning.     . clopidogrel (PLAVIX) 75 MG tablet TAKE 1 TABLET BY MOUTH EVERY DAY 90 tablet 1  . conjugated estrogens (PREMARIN) vaginal cream 1 applicator full into vagina twice a week 90 g 1  . diltiazem (CARDIZEM CD) 180 MG 24 hr capsule TAKE 1 CAPSULE BY MOUTH EVERY DAY (Patient taking differently: Take 180 mg by mouth at bedtime. ) 90 capsule 3  . famotidine (PEPCID) 20 MG tablet Take 1 tablet (20 mg total) by mouth 2 (two) times daily. 180 tablet 1  . famotidine (PEPCID) 40 MG tablet TAKE 1 TABLET BY MOUTH EVERYDAY AT BEDTIME (Patient taking differently: Take 40 mg by mouth at bedtime. ) 90 tablet 1  . ferrous sulfate 325 (65 FE) MG tablet Take 325 mg by mouth daily with breakfast.    . furosemide (LASIX) 20 MG tablet TAKE 1 TABLET BY MOUTH EVERY DAY (Patient taking differently: Take 20 mg by mouth daily. ) 90 tablet 3  . loperamide (IMODIUM A-D) 2 MG tablet Take 2 mg by mouth daily.    Marland Kitchen losartan (COZAAR) 50 MG tablet TAKE 1 TABLET BY MOUTH EVERY DAY 90 tablet 0  . Magnesium 250 MG TABS Take 250 mg by mouth daily.    . multivitamin-lutein (OCUVITE-LUTEIN)  CAPS capsule Take 1 capsule by mouth daily.    . potassium chloride (K-DUR) 10 MEQ tablet TAKE 1 TABLET BY MOUTH EVERY DAY 90 tablet 1  . rosuvastatin (CRESTOR) 20 MG tablet TAKE 1 TABLET BY MOUTH EVERYDAY AT BEDTIME 90 tablet 1  . SUMAtriptan (IMITREX) 50 MG tablet Take 50 mg by mouth every 2 (two) hours as needed for migraine. May repeat in 2 hours if headache persists or recurs.    . traZODone (DESYREL) 50 MG tablet TAKE 1 TABLET BY MOUTH EVERYDAY AT BEDTIME 90 tablet 1  . vitamin B-12 (CYANOCOBALAMIN) 1000 MCG tablet Take 1,000 mcg by mouth daily.    . nitroGLYCERIN (NITROSTAT) 0.4 MG SL  tablet Place 1 tablet (0.4 mg total) under the tongue every 5 (five) minutes as needed for chest pain. 25 tablet 3   No current facility-administered medications on file prior to visit.    Past Medical History:  Diagnosis Date  . Anxiety   . Artery stenosis (HCC)    left internal carotid, left subclavian  . Arthritis   . Bleeding tendency ([place]) 05/16/2012   Hx post op bleeding (epidural hematoma s/p ACDF '10; LLQ hematoma due to superficial vessel fascial bleed post colon resection; Normal platelet count; saw Dr. [name] '14  . CAD in native artery    a. mod to mod-severe CAD by cath 10/2016, med rx.  . Carotid stenosis, bilateral 08/31/2016   1-39% right and 40-59% left ICA stenosis by dopplers 05/2016  . Chronic diastolic CHF (congestive heart failure) ([place])   . Colon polyp   . Diverticulitis   . Family history of cardiovascular disease   . Fracture of right fibula   . GERD (gastroesophageal reflux disease)   . Headache(784.0)    mirgraines - history of  . Hearing aid worn    B/L  . HOH (hard of hearing)    wears hearing aids  . Hyperlipidemia   . Hypertension   . IBS (irritable bowel syndrome)    followed by Dr. [name]  . Liver cyst   . Melanoma ([place])    removed from back 04/2013  . Shortness of breath dyspnea    with exertion  . Stroke ([place]) 06/2012   mild  . SVT (supraventricular tachycardia) ([place])   . Urinary urgency   . UTI (lower urinary tract infection)    x 2 since June 2016  . Wears glasses     Past Surgical History:  Procedure Laterality Date  . ABDOMINAL HYSTERECTOMY    . ANGIOPLASTY     stent in subclavian LEFT 2016, angioplasty of stent 2017  . APPENDECTOMY    . AUGMENTATION MAMMAPLASTY    . BREAST EXCISIONAL BIOPSY Left    benign  . BREAST SURGERY     BIL breast augmentation  . CHOLECYSTECTOMY    . COLON SURGERY     Sigmoid Colectomy, returned for post op bleeding  . COLONOSCOPY W/ POLYPECTOMY    . COLONOSCOPY WITH PROPOFOL N/A 05/05/2016    Procedure: COLONOSCOPY WITH PROPOFOL;  Surgeon: Alphonsa Overall, MD;  Location: [facility];  Service: General;  Laterality: N/A;  . ESOPHAGOGASTRODUODENOSCOPY (EGD) WITH PROPOFOL N/A 05/05/2016   Procedure: ESOPHAGOGASTRODUODENOSCOPY (EGD) WITH PROPOFOL;  Surgeon: Alphonsa Overall, MD;  Location: [facility];  Service: General;  Laterality: N/A;  . EYE SURGERY     eyelid drooping fixed  . IR ANGIO INTRA EXTRACRAN SEL COM CAROTID INNOMINATE BILAT MOD SED  10/16/2018  . IR ANGIO VERTEBRAL SEL SUBCLAVIAN INNOMINATE UNI  L MOD SED  10/16/2018  . IR ANGIO VERTEBRAL SEL VERTEBRAL UNI R MOD SED  10/16/2018  . IR GENERIC HISTORICAL  12/24/2015   IR ANGIO INTRA EXTRACRAN SEL COM CAROTID INNOMINATE BILAT MOD SED 12/24/2015 Luanne Bras, MD [facility]  . IR GENERIC HISTORICAL  12/24/2015   IR ANGIOGRAM EXTREMITY LEFT 12/24/2015 Luanne Bras, MD [facility]  . IR GENERIC HISTORICAL  12/24/2015   IR ANGIO VERTEBRAL SEL VERTEBRAL UNI R MOD SED 12/24/2015 Luanne Bras, MD [facility]  . IR GENERIC HISTORICAL  01/05/2016   IR PTA NON CORO-LOWER EXTREM 01/05/2016 Luanne Bras, MD [facility]  . IR GENERIC HISTORICAL  03/07/2016   IR RADIOLOGIST EVAL & MGMT 03/07/2016 [facility]  . JOINT REPLACEMENT Left    thumb  . LEFT HEART CATH AND CORONARY ANGIOGRAPHY N/A 11/10/2016   Procedure: Left Heart Cath and Coronary Angiography;  Surgeon: Belva Crome, MD;  Location: [facility];  Service: Cardiovascular;  Laterality: N/A;  . MASTOID DEBRIDEMENT    . MASTOIDECTOMY REVISION    . RADIOLOGY WITH ANESTHESIA N/A 11/09/2014   Procedure: STENT PLACEMENT;  Surgeon: Luanne Bras, MD;  Location: [facility];  Service: Radiology;  Laterality: N/A;  . RADIOLOGY WITH ANESTHESIA N/A 05/31/2015   Procedure: RADIOLOGY WITH ANESTHESIA;  Surgeon: Luanne Bras, MD;  Location: [facility];  Service: Radiology;  Laterality: N/A;  . RADIOLOGY WITH ANESTHESIA N/A 01/05/2016   Procedure: RADIOLOGY WITH ANESTHESIA  ANGIOPLASTY WITH STENTNG;  Surgeon: Luanne Bras, MD;  Location: [facility];  Service: Radiology;  Laterality: N/A;  . RADIOLOGY WITH ANESTHESIA N/A 10/16/2018   Procedure: STENTING;  Surgeon: Luanne Bras, MD;  Location: [facility];  Service: Radiology;  Laterality: N/A;  . skin cancer excised  04/2013   Melonoma  . SPINE SURGERY     cervical fusion, returned to OR for post op  bleeding  . TONSILLECTOMY    . TONSILLECTOMY    . TUBAL LIGATION    . VAGINA SURGERY     anterior posterior repair    Social History   Socioeconomic History  . Marital status: Divorced    Spouse name: Not on file  . Number of children: 2  . Years of education: 59  . Highest education level: Not on file  Occupational History  . 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 Smoking status: Former Smoker    Packs/day: 2.00    Years: 22.00    Pack years: 44.00    Types: Cigarettes    Quit date: 04/23/1985    Years since quitting: 34.0  . Smokeless tobacco: Never Used  Substance and Sexual Activity  . Alcohol use: Yes    Alcohol/week: 4.0 standard drinks    Types: 4 Glasses of wine per week    Comment: 1 drink a  day  . Drug use: No  . Sexual activity: Yes    Birth control/protection: Surgical  Other Topics Concern  . Not on file  Social History Narrative   ** Merged History Encounter **       Domestic partner   Regular exercise-no   Right handed   Caffeine use-- 2 cups coffee daily, rare soda   Social Determinants of Health   Financial Resource Strain:   . Difficulty of Paying Living Expenses: Not on file  Food Insecurity:   . Worried About Charity fundraiser in the Last  Year: Not on file  . Ran Out of Food in the Last Year: Not on file  Transportation Needs:   . Lack of Transportation (Medical): Not on file  . Lack of Transportation (Non-Medical): Not on file  Physical Activity:   . Days of Exercise per Week: Not on  file  . Minutes of Exercise per Session: Not on file  Stress:   . Feeling of Stress : Not on file  Social Connections:   . Frequency of Communication with Friends and Family: Not on file  . Frequency of Social Gatherings with Friends and Family: Not on file  . Attends Religious Services: Not on file  . Active Member of Clubs or Organizations: Not on file  . Attends Archivist Meetings: Not on file  . Marital Status: Not on file    Family History  Problem Relation Age of Onset  . Hyperlipidemia Other   . Hypertension Other   . Cancer Other        lung, esophagus, stomach  . Stroke Other   . Heart disease Father   . Heart disease Sister   . Heart attack Sister   . Heart disease Brother   . Heart attack Brother   . Stroke Son     Review of Systems  Constitutional: Negative for chills and fever.  Respiratory: Negative for cough, shortness of breath and wheezing.   Cardiovascular: Negative for chest pain, palpitations and leg swelling.  Neurological: Positive for dizziness (occ). Negative for light-headedness and headaches.       Objective:   Vitals:   05/02/19 0950  BP: 140/62  Pulse: 68  Resp: 16  Temp: 97.9 F (36.6 C)  SpO2: 95%   BP Readings from Last 3 Encounters:  05/02/19 140/62  01/28/19 128/78  12/24/18 (!) 132/57   Wt Readings from Last 3 Encounters:  05/02/19 152 lb 11.2 oz (69.3 kg)  12/24/18 155 lb (70.3 kg)  10/30/18 154 lb 1.9 oz (69.9 kg)   Body mass index is 26.84 kg/m.   Physical Exam    Constitutional: Appears well-developed and well-nourished. No distress.  HENT:  Head: Normocephalic and atraumatic.  Neck: Neck supple. No tracheal deviation present. No thyromegaly present.  No cervical lymphadenopathy Cardiovascular: Normal rate, regular rhythm and normal heart sounds.  No murmur heard. B/l carotid bruit .  No edema Pulmonary/Chest: Effort normal and breath sounds normal. No respiratory distress. No has no wheezes. No rales.   Skin: Skin is warm and dry. Not diaphoretic.  Psychiatric: Normal mood and affect. Behavior is normal.      Assessment & Plan:    See Problem List for Assessment and Plan of chronic medical problems.    This visit occurred during the SARS-CoV-2 public health emergency.  Safety protocols were in place, including screening questions prior to the visit, additional usage of staff PPE, and extensive cleaning of exam room while observing appropriate contact time as indicated for disinfecting solutions.

## 2019-05-02 ENCOUNTER — Encounter: Payer: Self-pay | Admitting: Internal Medicine

## 2019-05-02 ENCOUNTER — Ambulatory Visit (INDEPENDENT_AMBULATORY_CARE_PROVIDER_SITE_OTHER): Payer: PPO | Admitting: Internal Medicine

## 2019-05-02 ENCOUNTER — Other Ambulatory Visit: Payer: Self-pay

## 2019-05-02 ENCOUNTER — Ambulatory Visit: Payer: Medicare Other | Attending: Internal Medicine

## 2019-05-02 VITALS — BP 140/62 | HR 68 | Temp 97.9°F | Resp 16 | Ht 63.25 in | Wt 152.7 lb

## 2019-05-02 DIAGNOSIS — K219 Gastro-esophageal reflux disease without esophagitis: Secondary | ICD-10-CM | POA: Diagnosis not present

## 2019-05-02 DIAGNOSIS — G4700 Insomnia, unspecified: Secondary | ICD-10-CM | POA: Diagnosis not present

## 2019-05-02 DIAGNOSIS — F418 Other specified anxiety disorders: Secondary | ICD-10-CM | POA: Diagnosis not present

## 2019-05-02 DIAGNOSIS — E785 Hyperlipidemia, unspecified: Secondary | ICD-10-CM

## 2019-05-02 DIAGNOSIS — I5032 Chronic diastolic (congestive) heart failure: Secondary | ICD-10-CM

## 2019-05-02 DIAGNOSIS — M1991 Primary osteoarthritis, unspecified site: Secondary | ICD-10-CM

## 2019-05-02 DIAGNOSIS — I2510 Atherosclerotic heart disease of native coronary artery without angina pectoris: Secondary | ICD-10-CM | POA: Diagnosis not present

## 2019-05-02 DIAGNOSIS — K588 Other irritable bowel syndrome: Secondary | ICD-10-CM

## 2019-05-02 DIAGNOSIS — R7303 Prediabetes: Secondary | ICD-10-CM | POA: Diagnosis not present

## 2019-05-02 DIAGNOSIS — I10 Essential (primary) hypertension: Secondary | ICD-10-CM

## 2019-05-02 DIAGNOSIS — Z23 Encounter for immunization: Secondary | ICD-10-CM

## 2019-05-02 LAB — CBC WITH DIFFERENTIAL/PLATELET
Basophils Absolute: 0 10*3/uL (ref 0.0–0.1)
Basophils Relative: 0.8 % (ref 0.0–3.0)
Eosinophils Absolute: 0.1 10*3/uL (ref 0.0–0.7)
Eosinophils Relative: 2.6 % (ref 0.0–5.0)
HCT: 38.1 % (ref 36.0–46.0)
Hemoglobin: 12.7 g/dL (ref 12.0–15.0)
Lymphocytes Relative: 21.2 % (ref 12.0–46.0)
Lymphs Abs: 1.1 10*3/uL (ref 0.7–4.0)
MCHC: 33.3 g/dL (ref 30.0–36.0)
MCV: 99.2 fl (ref 78.0–100.0)
Monocytes Absolute: 0.5 10*3/uL (ref 0.1–1.0)
Monocytes Relative: 9.5 % (ref 3.0–12.0)
Neutro Abs: 3.5 10*3/uL (ref 1.4–7.7)
Neutrophils Relative %: 65.9 % (ref 43.0–77.0)
Platelets: 278 10*3/uL (ref 150.0–400.0)
RBC: 3.84 Mil/uL — ABNORMAL LOW (ref 3.87–5.11)
RDW: 13.4 % (ref 11.5–15.5)
WBC: 5.3 10*3/uL (ref 4.0–10.5)

## 2019-05-02 LAB — LIPID PANEL
Cholesterol: 171 mg/dL (ref 0–200)
HDL: 84.6 mg/dL (ref >39.00)
LDL Cholesterol: 49 mg/dL (ref 0–99)
NonHDL: 86.83
Total CHOL/HDL Ratio: 2
Triglycerides: 187 mg/dL — ABNORMAL HIGH (ref 0.0–149.0)
VLDL: 37.4 mg/dL (ref 0.0–40.0)

## 2019-05-02 LAB — COMPREHENSIVE METABOLIC PANEL
ALT: 15 U/L (ref 0–35)
AST: 17 U/L (ref 0–37)
Albumin: 4.5 g/dL (ref 3.5–5.2)
Alkaline Phosphatase: 76 U/L (ref 39–117)
BUN: 17 mg/dL (ref 6–23)
CO2: 29 mEq/L (ref 19–32)
Calcium: 10.1 mg/dL (ref 8.4–10.5)
Chloride: 102 mEq/L (ref 96–112)
Creatinine, Ser: 1.11 mg/dL (ref 0.40–1.20)
GFR: 47.91 mL/min — ABNORMAL LOW (ref >60.00)
Glucose, Bld: 113 mg/dL — ABNORMAL HIGH (ref 70–99)
Potassium: 4.4 mEq/L (ref 3.5–5.1)
Sodium: 138 mEq/L (ref 135–145)
Total Bilirubin: 0.4 mg/dL (ref 0.2–1.2)
Total Protein: 7 g/dL (ref 6.0–8.3)

## 2019-05-02 LAB — TSH: TSH: 1.28 u[IU]/mL (ref 0.35–4.50)

## 2019-05-02 LAB — HEMOGLOBIN A1C: Hgb A1c MFr Bld: 6.2 % (ref 4.6–6.5)

## 2019-05-02 NOTE — Assessment & Plan Note (Signed)
Chronic, intermittent only Controlled, stable Continue current dose of medication- xanax prn

## 2019-05-02 NOTE — Assessment & Plan Note (Signed)
Chronic BP well controlled Current regimen effective and well tolerated Continue current medications at current doses cmp  

## 2019-05-02 NOTE — Assessment & Plan Note (Signed)
Chronic Check lipid panel  Continue daily statin Regular exercise and healthy diet encouraged  

## 2019-05-02 NOTE — Assessment & Plan Note (Signed)
Chronic GERD controlled Continue daily medication  

## 2019-05-02 NOTE — Assessment & Plan Note (Signed)
Chronic No concerning symptoms Following with cardiology Continue current medications

## 2019-05-02 NOTE — Assessment & Plan Note (Signed)
Chronic Euvolemic, Controlled Following with cardiology Continue current medications Cbc, cmp

## 2019-05-02 NOTE — Assessment & Plan Note (Signed)
Chronic Controlled Imodium prn

## 2019-05-02 NOTE — Progress Notes (Signed)
   Covid-19 Vaccination Clinic  Name:  Susan Gillespie    MRN: [id number] DOB: 1945/11/29  05/02/2019  Susan Gillespie was observed post Covid-19 immunization for 15 minutes without incidence. She was provided with Vaccine Information Sheet and instruction to access the V-Safe system.   Susan Gillespie was instructed to call 911 with any severe reactions post vaccine: Marland Kitchen Difficulty breathing  . Swelling of your face and throat  . A fast heartbeat  . A bad rash all over your body  . Dizziness and weakness    Immunizations Administered    Name Date Dose VIS Date Route   Pfizer COVID-19 Vaccine 05/02/2019 12:00 PM 0.3 mL 03/28/2019 Intramuscular   Manufacturer: [place]   Lot: [id number]   [place]: [id number]

## 2019-05-02 NOTE — Assessment & Plan Note (Signed)
Chronic Controlled, stable Continue current dose of medication - trazodone 50 mg nightly, xanax on occasion

## 2019-05-02 NOTE — Assessment & Plan Note (Addendum)
Taking celebrex as needed only Can go a week w/o taking it - tries not to take it

## 2019-05-02 NOTE — Assessment & Plan Note (Signed)
Chronic Check a1c Low sugar / carb diet Stressed regular exercise  

## 2019-05-03 ENCOUNTER — Encounter: Payer: Self-pay | Admitting: Internal Medicine

## 2019-05-23 ENCOUNTER — Ambulatory Visit: Payer: PPO | Attending: Internal Medicine

## 2019-05-23 DIAGNOSIS — Z23 Encounter for immunization: Secondary | ICD-10-CM | POA: Insufficient documentation

## 2019-05-23 NOTE — Progress Notes (Signed)
   Covid-19 Vaccination Clinic  Name:  Susan Gillespie    MRN: [id number] DOB: 1946/01/06  05/23/2019  Susan Gillespie was observed post Covid-19 immunization for 15 minutes without incidence. She was provided with Vaccine Information Sheet and instruction to access the V-Safe system.   Susan Gillespie was instructed to call 911 with any severe reactions post vaccine: Marland Kitchen Difficulty breathing  . Swelling of your face and throat  . A fast heartbeat  . A bad rash all over your body  . Dizziness and weakness    Immunizations Administered    Name Date Dose VIS Date Route   Pfizer COVID-19 Vaccine 05/23/2019 12:15 PM 0.3 mL 03/28/2019 Intramuscular   Manufacturer: [place]   Lot: [id number]   [place]: [id number]

## 2019-05-29 ENCOUNTER — Encounter: Payer: Self-pay | Admitting: Cardiology

## 2019-05-29 ENCOUNTER — Ambulatory Visit: Payer: PPO | Admitting: Cardiology

## 2019-05-29 ENCOUNTER — Other Ambulatory Visit: Payer: Self-pay

## 2019-05-29 VITALS — BP 128/56 | HR 74 | Ht 63.25 in | Wt 157.0 lb

## 2019-05-29 DIAGNOSIS — I471 Supraventricular tachycardia: Secondary | ICD-10-CM | POA: Diagnosis not present

## 2019-05-29 DIAGNOSIS — I10 Essential (primary) hypertension: Secondary | ICD-10-CM | POA: Diagnosis not present

## 2019-05-29 DIAGNOSIS — I2510 Atherosclerotic heart disease of native coronary artery without angina pectoris: Secondary | ICD-10-CM | POA: Diagnosis not present

## 2019-05-29 DIAGNOSIS — I771 Stricture of artery: Secondary | ICD-10-CM | POA: Diagnosis not present

## 2019-05-29 DIAGNOSIS — E785 Hyperlipidemia, unspecified: Secondary | ICD-10-CM | POA: Diagnosis not present

## 2019-05-29 DIAGNOSIS — I5032 Chronic diastolic (congestive) heart failure: Secondary | ICD-10-CM

## 2019-05-29 DIAGNOSIS — I6523 Occlusion and stenosis of bilateral carotid arteries: Secondary | ICD-10-CM

## 2019-05-29 MED ORDER — DILTIAZEM HCL ER COATED BEADS 180 MG PO CP24: 180.0000 mg | ORAL_CAPSULE | Freq: Every day | ORAL | 3 refills | Status: DC

## 2019-05-29 MED ORDER — FUROSEMIDE 40 MG PO TABS: 20.0000 mg | ORAL_TABLET | Freq: Every day | ORAL | 3 refills | Status: DC

## 2019-05-29 NOTE — Patient Instructions (Signed)
Medication Instructions:  Your physician recommends that you continue on your current medications as directed. Please refer to the Current Medication list given to you today.  *If you need a refill on your cardiac medications before your next appointment, please call your pharmacy*  Lab Work: TODAY: Fasting lipids and ALT  If you have labs (blood work) drawn today and your tests are completely normal, you will receive your results only by: Marland Kitchen MyChart Message (if you have MyChart) OR . A paper copy in the mail If you have any lab test that is abnormal or we need to change your treatment, we will call you to review the results.  Follow-Up: At [facility], you and your health needs are our priority.  As part of our continuing mission to provide you with exceptional heart care, we have created designated Provider Care Teams.  These Care Teams include your primary Cardiologist (physician) and Advanced Practice Providers (APPs -  Physician Assistants and Nurse Practitioners) who all work together to provide you with the care you need, when you need it.  Your next appointment:   1 year(s)  The format for your next appointment:   In Person  Provider:   Fransico Him, MD

## 2019-05-29 NOTE — Progress Notes (Signed)
Cardiology Office Note:    Date:  05/29/2019   ID:  Susan Gillespie, DOB 1945-09-14, MRN [id number]  PCP:  Binnie Rail, MD  Cardiologist:  Fransico Him, MD    Referring MD: Binnie Rail, MD   Chief Complaint  Patient presents with  . Follow-up    SVT, CAD, CHF, HTN    History of Present Illness:    Susan Gillespie is a 74 y.o. female with a hx of paroxysmal supraventricular tachycardia. She also has a history of left subclavian steal. She underwent stenting by Dr. Judi Cong and then repeat angioplasty on 01/05/2016 down to 80-85% patency.Dopplers 05/2017 with > 50% stenosis with patent stent in the left subclavian artery.  Repeat dopplers in 8/ 2019 showed 1-39% bilateral carotid stenosis with no mention of subclavian artery flow.  Cardiac CTfor SOB7/12/18 showed calcium score 92nd percentile for age/sex, significant calcific 3V disease with possibly hemodynamically significant stenosis in mid RCA and mLAD; subsequent FFR overread showed functionally significant D1 disease. She underwent LHC 11/10/16 showing moderate to moderately severe 3v calcific disease with anatomy and obstruction similar to that reported on cor CTA - Proximal 50-60% LAD, 70% proximal first diagonal, 50-60% prox-mid Cx ostial 60% and mid 50% RCa, normal LV function with elevated EDP c/w chronic diastolic CHF. She is now on medical management.   She is here today for followup and is doing well.  She denies any chest pain or pressure, SOB, DOE, PND, orthopnea, LE edema, dizziness, palpitations or syncope. She is compliant with her meds and is tolerating meds with no SE.    Past Medical History:  Diagnosis Date  . Anxiety   . Artery stenosis (HCC)    left internal carotid, left subclavian  . Arthritis   . Bleeding tendency ([place]) 05/16/2012   Hx post op bleeding (epidural hematoma s/p ACDF '10; LLQ hematoma due to superficial vessel fascial bleed post colon resection; Normal platelet count; saw Dr. [name]  '14  . CAD in native artery    a. mod to mod-severe CAD by cath 10/2016, med rx.  . Carotid stenosis, bilateral 08/31/2016   1-39% right and 40-59% left ICA stenosis by dopplers 05/2016  . Chronic diastolic CHF (congestive heart failure) ([place])   . Colon polyp   . Diverticulitis   . Family history of cardiovascular disease   . Fracture of right fibula   . GERD (gastroesophageal reflux disease)   . Headache(784.0)    mirgraines - history of  . Hearing aid worn    B/L  . HOH (hard of hearing)    wears hearing aids  . Hyperlipidemia   . Hypertension   . IBS (irritable bowel syndrome)    followed by Dr. [name]  . Liver cyst   . Melanoma ([place])    removed from back 04/2013  . Shortness of breath dyspnea    with exertion  . Stroke ([place]) 06/2012   mild  . SVT (supraventricular tachycardia) ([place])   . Urinary urgency   . UTI (lower urinary tract infection)    x 2 since June 2016  . Wears glasses     Past Surgical History:  Procedure Laterality Date  . ABDOMINAL HYSTERECTOMY    . ANGIOPLASTY     stent in subclavian LEFT 2016, angioplasty of stent 2017  . APPENDECTOMY    . AUGMENTATION MAMMAPLASTY    . BREAST EXCISIONAL BIOPSY Left    benign  . BREAST SURGERY     BIL breast augmentation  .  CHOLECYSTECTOMY    . COLON SURGERY     Sigmoid Colectomy, returned for post op bleeding  . COLONOSCOPY W/ POLYPECTOMY    . COLONOSCOPY WITH PROPOFOL N/A 05/05/2016   Procedure: COLONOSCOPY WITH PROPOFOL;  Surgeon: Alphonsa Overall, MD;  Location: [facility];  Service: General;  Laterality: N/A;  . ESOPHAGOGASTRODUODENOSCOPY (EGD) WITH PROPOFOL N/A 05/05/2016   Procedure: ESOPHAGOGASTRODUODENOSCOPY (EGD) WITH PROPOFOL;  Surgeon: Alphonsa Overall, MD;  Location: [facility];  Service: General;  Laterality: N/A;  . EYE SURGERY     eyelid drooping fixed  . IR ANGIO INTRA EXTRACRAN SEL COM CAROTID INNOMINATE BILAT MOD SED  10/16/2018  . IR ANGIO VERTEBRAL SEL SUBCLAVIAN INNOMINATE UNI L MOD SED  10/16/2018   . IR ANGIO VERTEBRAL SEL VERTEBRAL UNI R MOD SED  10/16/2018  . IR GENERIC HISTORICAL  12/24/2015   IR ANGIO INTRA EXTRACRAN SEL COM CAROTID INNOMINATE BILAT MOD SED 12/24/2015 Luanne Bras, MD [facility]  . IR GENERIC HISTORICAL  12/24/2015   IR ANGIOGRAM EXTREMITY LEFT 12/24/2015 Luanne Bras, MD [facility]  . IR GENERIC HISTORICAL  12/24/2015   IR ANGIO VERTEBRAL SEL VERTEBRAL UNI R MOD SED 12/24/2015 Luanne Bras, MD [facility]  . IR GENERIC HISTORICAL  01/05/2016   IR PTA NON CORO-LOWER EXTREM 01/05/2016 Luanne Bras, MD [facility]  . IR GENERIC HISTORICAL  03/07/2016   IR RADIOLOGIST EVAL & MGMT 03/07/2016 [facility]  . JOINT REPLACEMENT Left    thumb  . LEFT HEART CATH AND CORONARY ANGIOGRAPHY N/A 11/10/2016   Procedure: Left Heart Cath and Coronary Angiography;  Surgeon: Belva Crome, MD;  Location: [facility];  Service: Cardiovascular;  Laterality: N/A;  . MASTOID DEBRIDEMENT    . MASTOIDECTOMY REVISION    . RADIOLOGY WITH ANESTHESIA N/A 11/09/2014   Procedure: STENT PLACEMENT;  Surgeon: Luanne Bras, MD;  Location: [facility];  Service: Radiology;  Laterality: N/A;  . RADIOLOGY WITH ANESTHESIA N/A 05/31/2015   Procedure: RADIOLOGY WITH ANESTHESIA;  Surgeon: Luanne Bras, MD;  Location: [facility];  Service: Radiology;  Laterality: N/A;  . RADIOLOGY WITH ANESTHESIA N/A 01/05/2016   Procedure: RADIOLOGY WITH ANESTHESIA ANGIOPLASTY WITH STENTNG;  Surgeon: Luanne Bras, MD;  Location: [facility];  Service: Radiology;  Laterality: N/A;  . RADIOLOGY WITH ANESTHESIA N/A 10/16/2018   Procedure: STENTING;  Surgeon: Luanne Bras, MD;  Location: [facility];  Service: Radiology;  Laterality: N/A;  . skin cancer excised  04/2013   Melonoma  . SPINE SURGERY     cervical fusion, returned to OR for post op  bleeding  . TONSILLECTOMY    . TONSILLECTOMY    . TUBAL LIGATION    . VAGINA SURGERY     anterior posterior repair    Current Medications: Current Meds   Medication Sig  . acetaminophen (TYLENOL) 650 MG CR tablet Take 1,300 mg by mouth every 8 (eight) hours as needed for pain.   Marland Kitchen ALPRAZolam (XANAX) 0.5 MG tablet TAKE 0.5 TABLETS (0.25 MG TOTAL) BY MOUTH AT BEDTIME AS NEEDED FOR ANXIETY OR SLEEP.  Marland Kitchen aspirin 325 MG tablet Take 1 tablet (325 mg total) by mouth daily.  . celecoxib (CELEBREX) 200 MG capsule Take 200 mg by mouth daily as needed for mild pain.  . cetirizine (ZYRTEC) 10 MG tablet Take 10 mg by mouth every morning.   . clopidogrel (PLAVIX) 75 MG tablet TAKE 1 TABLET BY MOUTH EVERY DAY  . conjugated estrogens (PREMARIN) vaginal cream 1 applicator full into vagina twice a  week  . diltiazem (CARDIZEM CD) 180 MG 24 hr capsule TAKE 1 CAPSULE BY MOUTH EVERY DAY (Patient taking differently: Take 180 mg by mouth at bedtime. )  . famotidine (PEPCID) 20 MG tablet Take 1 tablet (20 mg total) by mouth 2 (two) times daily.  . ferrous sulfate 325 (65 FE) MG tablet Take 325 mg by mouth daily with breakfast.  . furosemide (LASIX) 20 MG tablet TAKE 1 TABLET BY MOUTH EVERY DAY (Patient taking differently: Take 20 mg by mouth daily. )  . loperamide (IMODIUM A-D) 2 MG tablet Take 2 mg by mouth every other day.   . losartan (COZAAR) 50 MG tablet TAKE 1 TABLET BY MOUTH EVERY DAY  . Magnesium 250 MG TABS Take 250 mg by mouth daily.  . multivitamin-lutein (OCUVITE-LUTEIN) CAPS capsule Take 1 capsule by mouth daily.  . potassium chloride (K-DUR) 10 MEQ tablet TAKE 1 TABLET BY MOUTH EVERY DAY  . rosuvastatin (CRESTOR) 20 MG tablet TAKE 1 TABLET BY MOUTH EVERYDAY AT BEDTIME  . SUMAtriptan (IMITREX) 50 MG tablet Take 50 mg by mouth every 2 (two) hours as needed for migraine. May repeat in 2 hours if headache persists or recurs.  . traZODone (DESYREL) 50 MG tablet TAKE 1 TABLET BY MOUTH EVERYDAY AT BEDTIME  . vitamin B-12 (CYANOCOBALAMIN) 1000 MCG tablet Take 1,000 mcg by mouth daily.     Allergies:   Phenergan [promethazine hcl], Daypro [oxaprozin], Lisinopril,  Other, Simvastatin, and Tramadol   Social History   Socioeconomic History  . Marital status: Divorced    Spouse name: Not on file  . Number of children: 2  . Years of education: 22  . Highest education level: Not on file  Occupational History  . 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 Smoking status: Former Smoker    Packs/day: 2.00    Years: 22.00    Pack years: 44.00    Types: Cigarettes    Quit date: 04/23/1985    Years since quitting: 34.1  . Smokeless tobacco: Never Used  Substance and Sexual Activity  . Alcohol use: Yes    Alcohol/week: 4.0 standard drinks    Types: 4 Glasses of wine per week    Comment: 1 drink a  day  . Drug use: No  . Sexual activity: Yes    Birth control/protection: Surgical  Other Topics Concern  . Not on file  Social History Narrative   ** Merged History Encounter **       Domestic partner   Regular exercise-no   Right handed   Caffeine use-- 2 cups coffee daily, rare soda   Social Determinants of Health   Financial Resource Strain:   . Difficulty of Paying Living Expenses: Not on file  Food Insecurity:   . Worried About Charity fundraiser in the Last Year: Not on file  . Ran Out of Food in the Last Year: Not on file  Transportation Needs:   . Lack of Transportation (Medical): Not on file  . Lack of Transportation (Non-Medical): Not on file  Physical Activity:   . Days of Exercise per Week: Not on file  . Minutes of Exercise per Session: Not on file  Stress:   . Feeling of Stress : Not on file  Social Connections:   . Frequency of Communication with Friends and Family: Not on file  . Frequency of Social Gatherings with Friends and  Family: Not on file  . Attends Religious Services: Not on file  . Active Member of Clubs or Organizations: Not on file  . Attends Archivist Meetings: Not on file  . Marital Status: Not on file     Family  History: The patient's family history includes Cancer in an other family member; Heart attack in her brother and sister; Heart disease in her brother, father, and sister; Hyperlipidemia in an other family member; Hypertension in an other family member; Stroke in her son and another family member.  ROS:   Please see the history of present illness.    ROS  All other systems reviewed and negative.   EKGs/Labs/Other Studies Reviewed:    The following studies were reviewed today: [facility] labs from [facility]  EKG:  EKG is  ordered today.  The ekg ordered today demonstrates NSR with no ST changes  Recent Labs: 05/02/2019: ALT 15; BUN 17; Creatinine, Ser 1.11; Hemoglobin 12.7; Platelets 278.0; Potassium 4.4; Sodium 138; TSH 1.28   Recent Lipid Panel    Component Value Date/Time   CHOL 171 05/02/2019 1039   CHOL 147 08/31/2016 0912   TRIG 187.0 (H) 05/02/2019 1039   HDL 84.60 05/02/2019 1039   HDL 78 08/31/2016 0912   CHOLHDL 2 05/02/2019 1039   VLDL 37.4 05/02/2019 1039   LDLCALC 49 05/02/2019 1039   LDLCALC 38 08/31/2016 0912   LDLDIRECT 50.0 10/30/2018 0852    Physical Exam:    VS:  BP (!) 128/56   Pulse 74   Ht 5' 3.25" (1.607 m)   Wt 157 lb (71.2 kg)   LMP  (LMP Unknown)   BMI 27.59 kg/m     Wt Readings from Last 3 Encounters:  05/29/19 157 lb (71.2 kg)  05/02/19 152 lb 11.2 oz (69.3 kg)  12/24/18 155 lb (70.3 kg)     GEN:  Well nourished, well developed in no acute distress HEENT: Normal NECK: No JVD; carotid bruit audible on the left LYMPHATICS: No lymphadenopathy CARDIAC: RRR, no murmurs, rubs, gallops RESPIRATORY:  Clear to auscultation without rales, wheezing or rhonchi  ABDOMEN: Soft, non-tender, non-distended MUSCULOSKELETAL:  No edema; No deformity  SKIN: Warm and dry NEUROLOGIC:  Alert and oriented x 3 PSYCHIATRIC:  Normal affect   ASSESSMENT:    1. CAD in native artery   2. Chronic diastolic CHF (congestive heart failure), NYHA class 1 (HCC)   3.  Carotid stenosis, bilateral   4. Paroxysmal supraventricular tachycardia ([place])   5. Essential hypertension, benign   6. 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HCC)   7. Hyperlipidemia LDL goal <70    PLAN:    In order of problems listed above:  1.  ASCAD  - LHC 11/10/16 showed moderate to moderately severe 3v calcific disease with proximal 50-60% LAD, 70% proximal first diagonal, 50-60% prox-mid Cx ostial 60% and mid 50% RCA.   -she has not had any anginal sx since I saw her last -continue ASA 81mg  daily, Plavix 75mg  daily and statin  2.  Chronic diastolic CHF  -she appears euvolemic on exam today -weight is stable and she denies any SOB or LE edema -continue Lasix 20mg  daily -I told her it was fine to take an extra 20mg  Lasix PRN for weight gain or swelling -creatinine was normal at 1.11 and K+ 4.4 on review of outside labs on [facility] in Jan 2021 -refill Lasix  3.  Carotid stenosis bilaterally  -1-39% RICA and [id number] LICA stenosis by dopplers 04-2019 -continue statin and  ASA. -followed by Dr. Estanislado Pandy  4.  PSVT  -she has not had any palpitations -continue Cardizem CD 180mg  daily and Lopressor PRN for breakthrough  -refill cardizem  5.  HTN -BP controlled on exam -continue Cardizem CD 180mg  daily and Losartan 50mg  daily   6.  Subclavian artery stenosis -  - she has a history of left subclavian steal and is s/p  stenting by Dr. Judi Cong with repeat angioplasty on 01/05/2016 down to 80-85% patency.  -Dopplers 2018 showed > 50% stenosis with patent stent in left subclavian.    -continue ASA, Plavix and statin  7.  Hyperlipidemia   LDL goal is < 70.   -LDL was 91 in Nov 2020 -repeat FLp and if LDL>70 then will increase Crestor -continue Crestor 20mg  daily for now    Medication Adjustments/Labs and Tests Ordered: Current medicines are reviewed at length with the patient today.  Concerns regarding medicines are outlined above.  Orders Placed This Encounter  Procedures  . EKG  12-Lead   No orders of the defined types were placed in this encounter.   Signed, Fransico Him, MD  05/29/2019 1:46 PM    [place]

## 2019-05-30 LAB — LIPID PANEL
Chol/HDL Ratio: 2 ratio (ref 0.0–4.4)
Cholesterol, Total: 165 mg/dL (ref 100–199)
HDL: 84 mg/dL (ref >39)
LDL Chol Calc (NIH): 58 mg/dL (ref 0–99)
Triglycerides: 139 mg/dL (ref 0–149)
VLDL Cholesterol Cal: 23 mg/dL (ref 5–40)

## 2019-05-30 LAB — ALT: ALT: 12 IU/L (ref 0–32)

## 2019-06-19 ENCOUNTER — Other Ambulatory Visit: Payer: Self-pay | Admitting: Physician Assistant

## 2019-06-19 ENCOUNTER — Other Ambulatory Visit: Payer: Self-pay | Admitting: Internal Medicine

## 2019-07-15 ENCOUNTER — Encounter: Payer: Self-pay | Admitting: Internal Medicine

## 2019-07-17 DIAGNOSIS — M189 Osteoarthritis of first carpometacarpal joint, unspecified: Secondary | ICD-10-CM | POA: Diagnosis not present

## 2019-07-17 DIAGNOSIS — M79641 Pain in right hand: Secondary | ICD-10-CM | POA: Diagnosis not present

## 2019-07-17 DIAGNOSIS — G5601 Carpal tunnel syndrome, right upper limb: Secondary | ICD-10-CM | POA: Diagnosis not present

## 2019-07-17 DIAGNOSIS — G5603 Carpal tunnel syndrome, bilateral upper limbs: Secondary | ICD-10-CM | POA: Diagnosis not present

## 2019-07-17 DIAGNOSIS — G458 Other transient cerebral ischemic attacks and related syndromes: Secondary | ICD-10-CM | POA: Diagnosis not present

## 2019-07-17 DIAGNOSIS — M13841 Other specified arthritis, right hand: Secondary | ICD-10-CM | POA: Diagnosis not present

## 2019-07-17 DIAGNOSIS — M79642 Pain in left hand: Secondary | ICD-10-CM | POA: Diagnosis not present

## 2019-07-21 ENCOUNTER — Other Ambulatory Visit: Payer: Self-pay | Admitting: Internal Medicine

## 2019-07-24 ENCOUNTER — Other Ambulatory Visit: Payer: Self-pay | Admitting: Internal Medicine

## 2019-07-29 ENCOUNTER — Ambulatory Visit (INDEPENDENT_AMBULATORY_CARE_PROVIDER_SITE_OTHER): Payer: PPO

## 2019-07-29 VITALS — BP 149/65 | HR 65 | Ht 63.0 in | Wt 157.0 lb

## 2019-07-29 DIAGNOSIS — Z1283 Encounter for screening for malignant neoplasm of skin: Secondary | ICD-10-CM | POA: Diagnosis not present

## 2019-07-29 DIAGNOSIS — Z08 Encounter for follow-up examination after completed treatment for malignant neoplasm: Secondary | ICD-10-CM | POA: Diagnosis not present

## 2019-07-29 DIAGNOSIS — Z0000 Encounter for general adult medical examination without abnormal findings: Secondary | ICD-10-CM

## 2019-07-29 DIAGNOSIS — D225 Melanocytic nevi of trunk: Secondary | ICD-10-CM | POA: Diagnosis not present

## 2019-07-29 DIAGNOSIS — Z85820 Personal history of malignant melanoma of skin: Secondary | ICD-10-CM | POA: Diagnosis not present

## 2019-07-29 NOTE — Patient Instructions (Signed)
Susan Gillespie , Thank you for taking time to come for your Medicare Wellness Visit. I appreciate your ongoing commitment to your health goals. Please review the following plan we discussed and let me know if I can assist you in the future.   Screening recommendations/referrals: Colorectal Screening: last done on 05/05/2016; not recommended due to age 74: last done 01/13/2019 Bone Density: last done 01/13/2019  Vision and Dental Exams: Recommended annual ophthalmology exams for early detection of glaucoma and other disorders of the eye Recommended annual dental exams for proper oral hygiene  Vaccinations: Influenza vaccine: 01/18/2019 Pneumococcal vaccine: 01/12/2014, 11/06/2018 Tdap vaccine: 04/18/2007; Due every 10 years. Please call your insurance company to determine your out of pocket expense. You may also receive this vaccine at your local pharmacy or Health Dept. Shingles vaccine:01/18/2019, 04/18/2019  Advanced directives: Advance directives discussed with you today. Please bring a copy of your POA (Power of [place]) and/or Living Will to your next appointment.  Goals:  Recommend to drink at least 6-8 8oz glasses of water per day.  Recommend to exercise for at least 150 minutes per week.  Recommend to remove any items from the home that may cause slips or trips.  Recommend to decrease portion sizes by eating 3 small healthy meals and at least 2 healthy snacks per day.  Recommend to begin DASH diet as directed below  Recommend to continue efforts to reduce smoking habits until no longer smoking. Smoking Cessation literature is attached below.  Next appointment: Please schedule your Annual Wellness Visit with your Nurse Health Advisor in one year.  Preventive Care 50 Years and Older, Female Preventive care refers to lifestyle choices and visits with your health care provider that can promote health and wellness. What does preventive care include?  A yearly physical exam. This is  also called an annual well check.  Dental exams once or twice a year.  Routine eye exams. Ask your health care provider how often you should have your eyes checked.  Personal lifestyle choices, including:  Daily care of your teeth and gums.  Regular physical activity.  Eating a healthy diet.  Avoiding tobacco and drug use.  Limiting alcohol use.  Practicing safe sex.  Taking low-dose aspirin every day if recommended by your health care provider.  Taking vitamin and mineral supplements as recommended by your health care provider. What happens during an annual well check? The services and screenings done by your health care provider during your annual well check will depend on your age, overall health, lifestyle risk factors, and family history of disease. Counseling  Your health care provider may ask you questions about your:  Alcohol use.  Tobacco use.  Drug use.  Emotional well-being.  Home and relationship well-being.  Sexual activity.  Eating habits.  History of falls.  Memory and ability to understand (cognition).  Work and work Statistician.  Reproductive health. Screening  You may have the following tests or measurements:  Height, weight, and BMI.  Blood pressure.  Lipid and cholesterol levels. These may be checked every 5 years, or more frequently if you are over 63 years old.  Skin check.  Lung cancer screening. You may have this screening every year starting at age 48 if you have a 30-pack-year history of smoking and currently smoke or have quit within the past 15 years.  Fecal occult blood test (FOBT) of the stool. You may have this test every year starting at age 29.  Flexible sigmoidoscopy or colonoscopy. You may have  a sigmoidoscopy every 5 years or a colonoscopy every 10 years starting at age 86.  Hepatitis C blood test.  Hepatitis B blood test.  Sexually transmitted disease (STD) testing.  Diabetes screening. This is done by  checking your blood sugar (glucose) after you have not eaten for a while (fasting). You may have this done every 1-3 years.  Bone density scan. This is done to screen for osteoporosis. You may have this done starting at age 28.  Mammogram. This may be done every 1-2 years. Talk to your health care provider about how often you should have regular mammograms. Talk with your health care provider about your test results, treatment options, and if necessary, the need for more tests. Vaccines  Your health care provider may recommend certain vaccines, such as:  Influenza vaccine. This is recommended every year.  Tetanus, diphtheria, and acellular pertussis (Tdap, Td) vaccine. You may need a Td booster every 10 years.  Zoster vaccine. You may need this after age 42.  Pneumococcal 13-valent conjugate (PCV13) vaccine. One dose is recommended after age 70.  Pneumococcal polysaccharide (PPSV23) vaccine. One dose is recommended after age 62. Talk to your health care provider about which screenings and vaccines you need and how often you need them. This information is not intended to replace advice given to you by your health care provider. Make sure you discuss any questions you have with your health care provider. Document Released: 04/30/2015 Document Revised: 12/22/2015 Document Reviewed: 02/02/2015 Elsevier Interactive Patient Education  2017 [place] Prevention in the Home Falls can cause injuries. They can happen to people of all ages. There are many things you can do to make your home safe and to help prevent falls. What can I do on the outside of my home?  Regularly fix the edges of walkways and driveways and fix any cracks.  Remove anything that might make you trip as you walk through a door, such as a raised step or threshold.  Trim any bushes or trees on the path to your home.  Use bright outdoor lighting.  Clear any walking paths of anything that might make someone trip,  such as rocks or tools.  Regularly check to see if handrails are loose or broken. Make sure that both sides of any steps have handrails.  Any raised decks and porches should have guardrails on the edges.  Have any leaves, snow, or ice cleared regularly.  Use sand or salt on walking paths during winter.  Clean up any spills in your garage right away. This includes oil or grease spills. What can I do in the bathroom?  Use night lights.  Install grab bars by the toilet and in the tub and shower. Do not use towel bars as grab bars.  Use non-skid mats or decals in the tub or shower.  If you need to sit down in the shower, use a plastic, non-slip stool.  Keep the floor dry. Clean up any water that spills on the floor as soon as it happens.  Remove soap buildup in the tub or shower regularly.  Attach bath mats securely with double-sided non-slip rug tape.  Do not have throw rugs and other things on the floor that can make you trip. What can I do in the bedroom?  Use night lights.  Make sure that you have a light by your bed that is easy to reach.  Do not use any sheets or blankets that are too big for your bed.  They should not hang down onto the floor.  Have a firm chair that has side arms. You can use this for support while you get dressed.  Do not have throw rugs and other things on the floor that can make you trip. What can I do in the kitchen?  Clean up any spills right away.  Avoid walking on wet floors.  Keep items that you use a lot in easy-to-reach places.  If you need to reach something above you, use a strong step stool that has a grab bar.  Keep electrical cords out of the way.  Do not use floor polish or wax that makes floors slippery. If you must use wax, use non-skid floor wax.  Do not have throw rugs and other things on the floor that can make you trip. What can I do with my stairs?  Do not leave any items on the stairs.  Make sure that there are  handrails on both sides of the stairs and use them. Fix handrails that are broken or loose. Make sure that handrails are as long as the stairways.  Check any carpeting to make sure that it is firmly attached to the stairs. Fix any carpet that is loose or worn.  Avoid having throw rugs at the top or bottom of the stairs. If you do have throw rugs, attach them to the floor with carpet tape.  Make sure that you have a light switch at the top of the stairs and the bottom of the stairs. If you do not have them, ask someone to add them for you. What else can I do to help prevent falls?  Wear shoes that:  Do not have high heels.  Have rubber bottoms.  Are comfortable and fit you well.  Are closed at the toe. Do not wear sandals.  If you use a stepladder:  Make sure that it is fully opened. Do not climb a closed stepladder.  Make sure that both sides of the stepladder are locked into place.  Ask someone to hold it for you, if possible.  Clearly mark and make sure that you can see:  Any grab bars or handrails.  First and last steps.  Where the edge of each step is.  Use tools that help you move around (mobility aids) if they are needed. These include:  Canes.  Walkers.  Scooters.  Crutches.  Turn on the lights when you go into a dark area. Replace any light bulbs as soon as they burn out.  Set up your furniture so you have a clear path. Avoid moving your furniture around.  If any of your floors are uneven, fix them.  If there are any pets around you, be aware of where they are.  Review your medicines with your doctor. Some medicines can make you feel dizzy. This can increase your chance of falling. Ask your doctor what other things that you can do to help prevent falls. This information is not intended to replace advice given to you by your health care provider. Make sure you discuss any questions you have with your health care provider. Document Released: 01/28/2009  Document Revised: 09/09/2015 Document Reviewed: 05/08/2014 Elsevier Interactive Patient Education  2017 [employer].

## 2019-07-29 NOTE — Progress Notes (Signed)
Subjective:   Susan Gillespie is a 74 y.o. female who presents for Medicare Annual (Subsequent) preventive examination.  Review of Systems:  Medicare Wellness Visit Cardiac Risk Factors include: advanced age (>93men, >67 women);dyslipidemia;hypertension;obesity (BMI >30kg/m2) Sleep Patterns: No issues with falling sleep; feels rested on waking after 8 hours of sleep. Home Safety/Smoke Alarms: Feels safe in home; Smoke alarms in place. Seat Belt Safety/Bike Helmet: Wears seat belt.    Objective:     Vitals: BP (!) 149/65   Pulse 65   Ht 5\' 3"  (1.6 m)   Wt 157 lb (71.2 kg)   LMP  (LMP Unknown)   SpO2 98%   BMI 27.81 kg/m   Body mass index is 27.81 kg/m.  Advanced Directives 07/29/2019 12/24/2018 11/10/2016 05/09/2016 05/05/2016 05/02/2016 01/05/2016  Does Patient Have a Medical Advance Directive? Yes Yes Yes Yes - Yes Yes  Type of Advance Directive Living will [place];Living will [place];Living will Living will;Healthcare Power of [place];Living will  Does patient want to make changes to medical advance directive? - - No - Patient declined - - - No - Patient declined  Copy of Healthcare Power of Attorney in Chart? - - No - copy requested - No - copy requested No - copy requested No - copy requested  Pre-existing out of facility DNR order (yellow form or pink MOST form) - - - - - - -    Tobacco Social History   Tobacco Use  Smoking Status Former Smoker  . Packs/day: 2.00  . Years: 22.00  . Pack years: 44.00  . Types: Cigarettes  . Quit date: 04/23/1985  . 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 Anxiety   . Artery stenosis (HCC)    left internal carotid, left subclavian  . Arthritis   . Bleeding tendency ([place]) 05/16/2012   Hx post op bleeding (epidural hematoma s/p ACDF '10; LLQ hematoma due to superficial vessel fascial bleed post colon resection; Normal platelet count; saw Dr. [name] '14  . CAD in native artery    a. mod to mod-severe CAD by cath 10/2016, med rx.  . Carotid stenosis, bilateral 08/31/2016   1-39% right and 50-69% lefft ICA stenosis dopplers 04/2019  . Chronic diastolic CHF (congestive heart failure) ([place])   . Colon polyp   . Diverticulitis   . Family history of cardiovascular disease   . Fracture of right fibula   . GERD (gastroesophageal reflux disease)   . Headache(784.0)    mirgraines - history of  . Hearing aid worn    B/L  . HOH (hard of hearing)    wears hearing aids  . Hyperlipidemia   . Hypertension   . IBS (irritable bowel syndrome)    followed by Dr. [name]  . Liver cyst   . Melanoma ([place])    removed from back 04/2013  .  Shortness of breath dyspnea    with exertion  . Stroke ([place]) 06/2012   mild  . SVT (supraventricular tachycardia) ([place])   . Urinary urgency   . UTI (lower urinary tract infection)    x 2 since June 2016  . Wears glasses    Past Surgical History:  Procedure Laterality Date  . ABDOMINAL HYSTERECTOMY    . ANGIOPLASTY     stent in subclavian LEFT 2016, angioplasty of stent 2017  . APPENDECTOMY    . AUGMENTATION MAMMAPLASTY    . BREAST EXCISIONAL BIOPSY Left    benign  . BREAST SURGERY     BIL breast augmentation  . CHOLECYSTECTOMY    . COLON SURGERY     Sigmoid Colectomy, returned for post op bleeding  . COLONOSCOPY W/ POLYPECTOMY    . COLONOSCOPY WITH PROPOFOL N/A 05/05/2016     Procedure: COLONOSCOPY WITH PROPOFOL;  Surgeon: Alphonsa Overall, MD;  Location: [facility];  Service: General;  Laterality: N/A;  . ESOPHAGOGASTRODUODENOSCOPY (EGD) WITH PROPOFOL N/A 05/05/2016   Procedure: ESOPHAGOGASTRODUODENOSCOPY (EGD) WITH PROPOFOL;  Surgeon: Alphonsa Overall, MD;  Location: [facility];  Service: General;  Laterality: N/A;  . EYE SURGERY     eyelid drooping fixed  . IR ANGIO INTRA EXTRACRAN SEL COM CAROTID INNOMINATE BILAT MOD SED  10/16/2018  . IR ANGIO VERTEBRAL SEL SUBCLAVIAN INNOMINATE UNI L MOD SED  10/16/2018  . IR ANGIO VERTEBRAL SEL VERTEBRAL UNI R MOD SED  10/16/2018  . IR GENERIC HISTORICAL  12/24/2015   IR ANGIO INTRA EXTRACRAN SEL COM CAROTID INNOMINATE BILAT MOD SED 12/24/2015 Luanne Bras, MD [facility]  . IR GENERIC HISTORICAL  12/24/2015   IR ANGIOGRAM EXTREMITY LEFT 12/24/2015 Luanne Bras, MD [facility]  . IR GENERIC HISTORICAL  12/24/2015   IR ANGIO VERTEBRAL SEL VERTEBRAL UNI R MOD SED 12/24/2015 Luanne Bras, MD [facility]  . IR GENERIC HISTORICAL  01/05/2016   IR PTA NON CORO-LOWER EXTREM 01/05/2016 Luanne Bras, MD [facility]  . IR GENERIC HISTORICAL  03/07/2016   IR RADIOLOGIST EVAL & MGMT 03/07/2016 [facility]  . JOINT REPLACEMENT Left    thumb  . LEFT HEART CATH AND CORONARY ANGIOGRAPHY N/A 11/10/2016   Procedure: Left Heart Cath and Coronary Angiography;  Surgeon: Belva Crome, MD;  Location: [facility];  Service: Cardiovascular;  Laterality: N/A;  . MASTOID DEBRIDEMENT    . MASTOIDECTOMY REVISION    . RADIOLOGY WITH ANESTHESIA N/A 11/09/2014   Procedure: STENT PLACEMENT;  Surgeon: Luanne Bras, MD;  Location: [facility];  Service: Radiology;  Laterality: N/A;  . RADIOLOGY WITH ANESTHESIA N/A 05/31/2015   Procedure: RADIOLOGY WITH ANESTHESIA;  Surgeon: Luanne Bras, MD;  Location: [facility];  Service: Radiology;  Laterality: N/A;  . RADIOLOGY WITH ANESTHESIA N/A 01/05/2016   Procedure: RADIOLOGY WITH ANESTHESIA  ANGIOPLASTY WITH STENTNG;  Surgeon: Luanne Bras, MD;  Location: [facility];  Service: Radiology;  Laterality: N/A;  . RADIOLOGY WITH ANESTHESIA N/A 10/16/2018   Procedure: STENTING;  Surgeon: Luanne Bras, MD;  Location: [facility];  Service: Radiology;  Laterality: N/A;  . skin cancer excised  04/2013   Melonoma  . SPINE SURGERY     cervical fusion, returned to OR for post op  bleeding  . TONSILLECTOMY    . TONSILLECTOMY    . TUBAL LIGATION    . VAGINA SURGERY     anterior posterior repair   Family History  Problem Relation Age of Onset  . Hyperlipidemia Other   .  Hypertension Other   . Cancer Other        lung, esophagus, stomach  . Stroke Other   . Heart disease Father   . Heart disease Sister   . Heart attack Sister   . Heart disease Brother   . Heart attack Brother   . Stroke Son    Social History   Socioeconomic History  . Marital status: Divorced    Spouse name: Not on file  . Number of children: 2  . Years of education: 28  . Highest education level: Not on file  Occupational History  . 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 Smoking status: Former Smoker    Packs/day: 2.00    Years: 22.00    Pack years: 44.00    Types: Cigarettes    Quit date: 04/23/1985    Years since quitting: 34.2  . Smokeless tobacco: Never Used  Substance and Sexual Activity  . Alcohol use: Yes    Alcohol/week: 4.0 standard drinks    Types: 4 Glasses of wine per week    Comment: 1 drink a  day  . Drug use: No  . Sexual activity: Yes    Birth control/protection: Surgical  Other Topics Concern  . Not on file  Social History Narrative   ** Merged History Encounter **       Domestic partner   Regular exercise-no   Right handed   Caffeine use-- 2 cups coffee daily, rare soda   Social Determinants of Health   Financial Resource Strain:   . Difficulty of Paying Living Expenses:   Food Insecurity:   .  Worried About Charity fundraiser in the Last Year:   . Arboriculturist in the Last Year:   Transportation Needs:   . Film/video editor (Medical):   Marland Kitchen Lack of Transportation (Non-Medical):   Physical Activity:   . Days of Exercise per Week:   . Minutes of Exercise per Session:   Stress:   . Feeling of Stress :   Social Connections:   . Frequency of Communication with Friends and Family:   . Frequency of Social Gatherings with Friends and Family:   . Attends Religious Services:   . Active Member of Clubs or Organizations:   . Attends Archivist Meetings:   Marland Kitchen Marital Status:     Outpatient Encounter Medications as of 07/29/2019  Medication Sig  . acetaminophen (TYLENOL) 650 MG CR tablet Take 1,300 mg by mouth every 8 (eight) hours as needed for pain.   Marland Kitchen ALPRAZolam (XANAX) 0.5 MG tablet TAKE 0.5 TABLETS (0.25 MG TOTAL) BY MOUTH AT BEDTIME AS NEEDED FOR ANXIETY OR SLEEP.  Marland Kitchen aspirin 325 MG tablet Take 1 tablet (325 mg total) by mouth daily.  . celecoxib (CELEBREX) 200 MG capsule Take 200 mg by mouth daily as needed for mild pain.  . cetirizine (ZYRTEC) 10 MG tablet Take 10 mg by mouth every morning.   . clopidogrel (PLAVIX) 75 MG tablet TAKE 1 TABLET BY MOUTH EVERY DAY  . conjugated estrogens (PREMARIN) vaginal cream 1 applicator full into vagina twice a week  . diltiazem (CARDIZEM CD) 180 MG 24 hr capsule Take 1 capsule (180 mg total) by mouth at bedtime.  . famotidine (PEPCID) 20 MG tablet TAKE ONE TABLET BY MOUTH TWICE A DAY  . ferrous sulfate 325 (65 FE) MG tablet Take 325 mg  by mouth daily with breakfast.  . furosemide (LASIX) 40 MG tablet Take 0.5 tablets (20 mg total) by mouth daily. Take 0.5 tablets (20 mg) daily. Take and additional 0.5 tablet as needed for edema.  Marland Kitchen loperamide (IMODIUM A-D) 2 MG tablet Take 2 mg by mouth every other day.   . losartan (COZAAR) 50 MG tablet TAKE 1 TABLET BY MOUTH EVERY DAY  . Magnesium 250 MG TABS Take 250 mg by mouth daily.  .  multivitamin-lutein (OCUVITE-LUTEIN) CAPS capsule Take 1 capsule by mouth daily.  . nitroGLYCERIN (NITROSTAT) 0.4 MG SL tablet Place 1 tablet (0.4 mg total) under the tongue every 5 (five) minutes as needed for chest pain.  . potassium chloride (KLOR-CON) 10 MEQ tablet TAKE 1 TABLET BY MOUTH EVERY DAY  . rosuvastatin (CRESTOR) 20 MG tablet TAKE 1 TABLET BY MOUTH EVERYDAY AT BEDTIME  . SUMAtriptan (IMITREX) 50 MG tablet Take 50 mg by mouth every 2 (two) hours as needed for migraine. May repeat in 2 hours if headache persists or recurs.  . traZODone (DESYREL) 50 MG tablet TAKE 1 TABLET BY MOUTH EVERYDAY AT BEDTIME  . vitamin B-12 (CYANOCOBALAMIN) 1000 MCG tablet Take 1,000 mcg by mouth daily.   No facility-administered encounter medications on file as of 07/29/2019.    Activities of Daily Living In your present state of health, do you have any difficulty performing the following activities: 07/29/2019 10/16/2018  Hearing? Tempie Donning  Comment has hearing aids -  Vision? N N  Difficulty concentrating or making decisions? Y N  Comment at times -  Walking or climbing stairs? N N  Dressing or bathing? N N  Preparing Food and eating ? N -  Using the Toilet? N -  In the past six months, have you accidently leaked urine? Y -  Comment wears mini pad -  Do you have problems with loss of bowel control? N -  Managing your Medications? N -  Managing your Finances? N -  Housekeeping or managing your Housekeeping? N -  Some recent data might be hidden    Patient Care Team: Binnie Rail, MD as PCP - General (Internal Medicine) Sueanne Margarita, MD as PCP - Cardiology (Cardiology)    Assessment:   This is a routine wellness examination for [place].  Exercise Activities and Dietary recommendations Current Exercise Habits: Home exercise routine, Type of exercise: walking, Time (Minutes): 30, Frequency (Times/Week): 5, Weekly Exercise (Minutes/Week): 150, Intensity: Moderate, Exercise limited by: None  identified  Goals    . Patient Stated     Continue to stay active and healthy.       Fall Risk Fall Risk  07/29/2019 07/29/2019 05/02/2019 02/21/2017 09/19/2012  Falls in the past year? 0 0 1 No No  Number falls in past yr: 0 0 1 - -  Injury with Fall? 0 0 0 - -  Risk for fall due to : No Fall Risks No Fall Risks - - -  Follow up - Falls evaluation completed;Education provided - - -   Is the patient's home free of loose throw rugs in walkways, pet beds, electrical cords, etc?   yes      Grab bars in the bathroom? no      Handrails on the stairs?   yes      Adequate lighting?   yes  Depression Screen PHQ 2/9 Scores 07/29/2019 05/02/2019 04/05/2018 09/19/2012  PHQ - 2 Score 0 0 0 0     Cognitive Function  6CIT Screen 07/29/2019  What Year? 0 points  What month? 0 points  What time? 0 points  Count back from 20 0 points  Months in reverse 0 points  Repeat phrase 0 points  Total Score 0    Immunization History  Administered Date(s) Administered  . Influenza Whole 02/14/2012  . Influenza, High Dose Seasonal PF 01/19/2017, 02/08/2018  . Influenza, Quadrivalent, Recombinant, Inj, Pf 01/18/2019  . Influenza,inj,Quad PF,6+ Mos 01/06/2016  . Influenza,inj,quad, With Preservative 01/12/2014  . Influenza-Unspecified 01/15/2013, 02/03/2015, 01/19/2017  . PFIZER SARS-COV-2 Vaccination 05/02/2019, 05/23/2019  . Pneumococcal Conjugate-13 04/28/2009, 01/12/2014  . Pneumococcal Polysaccharide-23 11/06/2018  . Td 04/18/2007  . Zoster 04/28/2009  . Zoster Recombinat (Shingrix) 01/18/2019, 04/18/2019    Screening Tests Health Maintenance  Topic Date Due  . TETANUS/TDAP  04/17/2017  . Hepatitis C Screening  04/05/2029 (Originally [birth year])  . INFLUENZA VACCINE  11/16/2019  . DEXA SCAN  01/13/2024  . COLONOSCOPY  05/05/2026  . PNA vac Low Risk Adult  Completed    Cancer Screenings: Lung: Low Dose CT Chest recommended if Age 3-80 years, 30 pack-year currently smoking OR have  quit w/in 15years. Patient does not qualify. Breast:  Up to date on Mammogram? Yes   Up to date of Bone Density/Dexa? Yes Colorectal: Yes     Plan:    Reviewed health maintenance screenings with patient today and relevant education, vaccines, and/or referrals were provided.    Continue doing brain stimulating activities (puzzles, reading, adult coloring books, staying active) to keep memory sharp.    Continue to eat heart healthy diet (full of fruits, vegetables, whole grains, lean protein, water--limit salt, fat, and sugar intake) and increase physical activity as tolerated.  I have personally reviewed and noted the following in the patient's chart:   . Medical and social history . Use of alcohol, tobacco or illicit drugs  . Current medications and supplements . Functional ability and status . Nutritional status . Physical activity . Advanced directives . List of other physicians . Hospitalizations, surgeries, and ER visits in previous 12 months . Vitals . Screenings to include cognitive, depression, and falls . Referrals and appointments  In addition, I have reviewed and discussed with patient certain preventive protocols, quality metrics, and best practice recommendations. A written personalized care plan for preventive services as well as general preventive health recommendations were provided to patient.     Sheral Flow, LPN  [id number] Nurse Health Advisor, Embedded Care Coordination [place] [facility]  Site: [facility] [place]: [phone] Website: [email or web address]

## 2019-08-01 NOTE — Progress Notes (Signed)
This visit is being conducted via phone call due to the COVID-19 pandemic. This patient has given me verbal consent via phone to conduct this visit, patient states they are participating from their home address. Some vital signs may be absent or patient reported.   Patient identification: identified by name, DOB, and current address.  Location provider: [facility], Office Persons participating in the virtual visit: Lisette Abu, LPN, patient, Dr. Billey Gosling

## 2019-08-20 ENCOUNTER — Telehealth (HOSPITAL_COMMUNITY): Payer: Self-pay

## 2019-08-20 ENCOUNTER — Other Ambulatory Visit: Payer: Self-pay | Admitting: Internal Medicine

## 2019-08-20 ENCOUNTER — Other Ambulatory Visit (HOSPITAL_COMMUNITY): Payer: Self-pay | Admitting: Interventional Radiology

## 2019-08-20 DIAGNOSIS — I771 Stricture of artery: Secondary | ICD-10-CM

## 2019-08-20 NOTE — Telephone Encounter (Signed)
Called to schedule us carotid, no answer, left vm. AW  

## 2019-08-21 NOTE — Telephone Encounter (Signed)
Last OV 05/02/19 Next OV 10/30/19 Last RF 06/19/19

## 2019-08-28 ENCOUNTER — Other Ambulatory Visit: Payer: Self-pay

## 2019-08-28 ENCOUNTER — Ambulatory Visit (HOSPITAL_COMMUNITY)
Admission: RE | Admit: 2019-08-28 | Discharge: 2019-08-28 | Disposition: A | Discharge status: Home / Self Care | Payer: PPO | Source: Ambulatory Visit | Attending: Interventional Radiology | Admitting: Interventional Radiology

## 2019-08-28 DIAGNOSIS — I771 Stricture of artery: Secondary | ICD-10-CM | POA: Diagnosis not present

## 2019-08-28 NOTE — Progress Notes (Signed)
Carotid duplex has been completed.   Preliminary results in CV Proc.   Abram Sander 08/28/2019 1:25 PM

## 2019-09-01 ENCOUNTER — Telehealth (HOSPITAL_COMMUNITY): Payer: Self-pay

## 2019-09-01 NOTE — Telephone Encounter (Signed)
Pt agreed to f/u in 6 months with us carotid. AW 

## 2019-09-11 ENCOUNTER — Other Ambulatory Visit: Payer: Self-pay | Admitting: Internal Medicine

## 2019-10-08 ENCOUNTER — Encounter: Payer: Self-pay | Admitting: Internal Medicine

## 2019-10-30 ENCOUNTER — Ambulatory Visit: Payer: PPO | Admitting: Internal Medicine

## 2019-11-10 NOTE — Progress Notes (Signed)
Subjective:    Patient ID: Susan Gillespie, female    DOB: September 19, 1945, 74 y.o.   MRN: [id number]  HPI The patient is here for follow up of their chronic medical problems, including CAD, CHF, htn, hyperlipidemia, prediabetes, neck pain, OA, IBS, HRT, insomnia, anxiety  She is taking all of her medications as prescribed.   She is active, but not exercising regularly.     She has been having cold symptoms that started two weeks ago. She thinks it is better now and has gotten over it.    She fell last Sept.  She is still having right lower back pain.  She can go a day or two and is ok, but some days she has right lower back/hip pain which is painful when she first stands or is on her feet a lot.  celebrex helps for a couple of days.  When it comes it is intermittent for a day.   She should not take celebrex due to being on plavix/ asa.     Medications and allergies reviewed with patient and updated if appropriate.  Patient Active Problem List   Diagnosis Date Noted  . Lower back pain 11/11/2019  . Post menopausal syndrome 10/09/2017  . Migraine without status migrainosus, not intractable 10/09/2017  . Spondylosis of cervical region without myelopathy or radiculopathy 10/09/2017  . Insomnia 10/09/2017  . IBS (irritable bowel syndrome) 10/09/2017  . Chronic diastolic CHF (congestive heart failure), NYHA class 1 ([place]) 02/20/2017  . CAD in native artery 11/10/2016  . Carotid stenosis, bilateral 08/31/2016  . 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[place]) 01/05/2016  . Pernicious anemia 09/15/2013  . B12 deficiency anemia 09/15/2013  . Situational anxiety 12/20/2012  . Spinal stenosis of lumbar region 07/05/2012  . Cerebral infarction ([place]) 06/28/2012  . Bleeding tendency ([place]) 05/16/2012  . Paroxysmal supraventricular tachycardia ([place]) 09/25/2011  . Essential hypertension, benign 08/22/2010  . Prediabetes 04/28/2009  . Hyperlipidemia LDL goal <70 12/16/2008  . GERD 12/16/2008  . Osteoarthritis  12/16/2008    Current Outpatient Medications on File Prior to Visit  Medication Sig Dispense Refill  . acetaminophen (TYLENOL) 650 MG CR tablet Take 1,300 mg by mouth every 8 (eight) hours as needed for pain.     Marland Kitchen ALPRAZolam (XANAX) 0.5 MG tablet TAKE 0.5 TABLETS (0.25 MG TOTAL) BY MOUTH AT BEDTIME AS NEEDED FOR ANXIETY OR SLEEP. 30 tablet 0  . aspirin 325 MG tablet Take 1 tablet (325 mg total) by mouth daily. 30 tablet 0  . celecoxib (CELEBREX) 200 MG capsule Take 200 mg by mouth daily as needed for mild pain.    . cetirizine (ZYRTEC) 10 MG tablet Take 10 mg by mouth every morning.     . clopidogrel (PLAVIX) 75 MG tablet TAKE 1 TABLET BY MOUTH EVERY DAY 90 tablet 1  . conjugated estrogens (PREMARIN) vaginal cream 1 applicator full into vagina twice a week 90 g 1  . diltiazem (CARDIZEM CD) 180 MG 24 hr capsule Take 1 capsule (180 mg total) by mouth at bedtime. 90 capsule 3  . ferrous sulfate 325 (65 FE) MG tablet Take 325 mg by mouth daily with breakfast.    . furosemide (LASIX) 40 MG tablet Take 0.5 tablets (20 mg total) by mouth daily. Take 0.5 tablets (20 mg) daily. Take and additional 0.5 tablet as needed for edema. 90 tablet 3  . loperamide (IMODIUM A-D) 2 MG tablet Take 2 mg by mouth every other day.     . losartan (COZAAR)  50 MG tablet TAKE 1 TABLET BY MOUTH EVERY DAY 90 tablet 0  . Magnesium 250 MG TABS Take 250 mg by mouth daily.    . multivitamin-lutein (OCUVITE-LUTEIN) CAPS capsule Take 1 capsule by mouth daily.    . potassium chloride (KLOR-CON) 10 MEQ tablet TAKE 1 TABLET BY MOUTH EVERY DAY 90 tablet 1  . rosuvastatin (CRESTOR) 20 MG tablet TAKE 1 TABLET BY MOUTH EVERYDAY AT BEDTIME 90 tablet 1  . SUMAtriptan (IMITREX) 50 MG tablet Take 50 mg by mouth every 2 (two) hours as needed for migraine. May repeat in 2 hours if headache persists or recurs.    . traZODone (DESYREL) 50 MG tablet TAKE 1 TABLET BY MOUTH EVERYDAY AT BEDTIME 90 tablet 1  . vitamin B-12 (CYANOCOBALAMIN) 1000 MCG  tablet Take 1,000 mcg by mouth daily.    . nitroGLYCERIN (NITROSTAT) 0.4 MG SL tablet Place 1 tablet (0.4 mg total) under the tongue every 5 (five) minutes as needed for chest pain. 25 tablet 3   No current facility-administered medications on file prior to visit.    Past Medical History:  Diagnosis Date  . Anxiety   . Artery stenosis (H[facility])    left internal carotid, left subclavian  . Arthritis   . Bleeding tendency ([place]) 05/16/2012   Hx post op bleeding (epidural hematoma s/p ACDF '10; LLQ hematoma due to superficial vessel fascial bleed post colon resection; Normal platelet count; saw Dr. [name] '14  . CAD in native artery    a. mod to mod-severe CAD by cath 10/2016, med rx.  . Carotid stenosis, bilateral 08/31/2016   1-39% right and 50-69% lefft ICA stenosis dopplers 04/2019  . Chronic diastolic CHF (congestive heart failure) ([place])   . Colon polyp   . Diverticulitis   . Family history of cardiovascular disease   . Fracture of right fibula   . GERD (gastroesophageal reflux disease)   . Headache(784.0)    mirgraines - history of  . Hearing aid worn    B/L  . HOH (hard of hearing)    wears hearing aids  . Hyperlipidemia   . Hypertension   . IBS (irritable bowel syndrome)    followed by Dr. [name]  . Liver cyst   . Melanoma ([place])    removed from back 04/2013  . Shortness of breath dyspnea    with exertion  . Stroke ([place]) 06/2012   mild  . SVT (supraventricular tachycardia) ([place])   . Urinary urgency   . UTI (lower urinary tract infection)    x 2 since June 2016  . Wears glasses     Past Surgical History:  Procedure Laterality Date  . ABDOMINAL HYSTERECTOMY    . ANGIOPLASTY     stent in subclavian LEFT 2016, angioplasty of stent 2017  . APPENDECTOMY    . AUGMENTATION MAMMAPLASTY    . BREAST EXCISIONAL BIOPSY Left    benign  . BREAST SURGERY     BIL breast augmentation  . CHOLECYSTECTOMY    . COLON SURGERY     Sigmoid Colectomy, returned for post op bleeding    . COLONOSCOPY W/ POLYPECTOMY    . COLONOSCOPY WITH PROPOFOL N/A 05/05/2016   Procedure: COLONOSCOPY WITH PROPOFOL;  Surgeon: Alphonsa Overall, MD;  Location: [facility];  Service: General;  Laterality: N/A;  . ESOPHAGOGASTRODUODENOSCOPY (EGD) WITH PROPOFOL N/A 05/05/2016   Procedure: ESOPHAGOGASTRODUODENOSCOPY (EGD) WITH PROPOFOL;  Surgeon: Alphonsa Overall, MD;  Location: [facility];  Service: General;  Laterality: N/A;  . EYE SURGERY  eyelid drooping fixed  . IR ANGIO INTRA EXTRACRAN SEL COM CAROTID INNOMINATE BILAT MOD SED  10/16/2018  . IR ANGIO VERTEBRAL SEL SUBCLAVIAN INNOMINATE UNI L MOD SED  10/16/2018  . IR ANGIO VERTEBRAL SEL VERTEBRAL UNI R MOD SED  10/16/2018  . IR GENERIC HISTORICAL  12/24/2015   IR ANGIO INTRA EXTRACRAN SEL COM CAROTID INNOMINATE BILAT MOD SED 12/24/2015 Luanne Bras, MD [facility]  . IR GENERIC HISTORICAL  12/24/2015   IR ANGIOGRAM EXTREMITY LEFT 12/24/2015 Luanne Bras, MD [facility]  . IR GENERIC HISTORICAL  12/24/2015   IR ANGIO VERTEBRAL SEL VERTEBRAL UNI R MOD SED 12/24/2015 Luanne Bras, MD [facility]  . IR GENERIC HISTORICAL  01/05/2016   IR PTA NON CORO-LOWER EXTREM 01/05/2016 Luanne Bras, MD [facility]  . IR GENERIC HISTORICAL  03/07/2016   IR RADIOLOGIST EVAL & MGMT 03/07/2016 [facility]  . JOINT REPLACEMENT Left    thumb  . LEFT HEART CATH AND CORONARY ANGIOGRAPHY N/A 11/10/2016   Procedure: Left Heart Cath and Coronary Angiography;  Surgeon: Belva Crome, MD;  Location: [facility];  Service: Cardiovascular;  Laterality: N/A;  . MASTOID DEBRIDEMENT    . MASTOIDECTOMY REVISION    . RADIOLOGY WITH ANESTHESIA N/A 11/09/2014   Procedure: STENT PLACEMENT;  Surgeon: Luanne Bras, MD;  Location: [facility];  Service: Radiology;  Laterality: N/A;  . RADIOLOGY WITH ANESTHESIA N/A 05/31/2015   Procedure: RADIOLOGY WITH ANESTHESIA;  Surgeon: Luanne Bras, MD;  Location: [facility];  Service: Radiology;  Laterality: N/A;  . RADIOLOGY  WITH ANESTHESIA N/A 01/05/2016   Procedure: RADIOLOGY WITH ANESTHESIA ANGIOPLASTY WITH STENTNG;  Surgeon: Luanne Bras, MD;  Location: [facility];  Service: Radiology;  Laterality: N/A;  . RADIOLOGY WITH ANESTHESIA N/A 10/16/2018   Procedure: STENTING;  Surgeon: Luanne Bras, MD;  Location: [facility];  Service: Radiology;  Laterality: N/A;  . skin cancer excised  04/2013   Melonoma  . SPINE SURGERY     cervical fusion, returned to OR for post op  bleeding  . TONSILLECTOMY    . TONSILLECTOMY    . TUBAL LIGATION    . VAGINA SURGERY     anterior posterior repair    Social History   Socioeconomic History  . Marital status: Divorced    Spouse name: Not on file  . Number of children: 2  . Years of education: 56  . Highest education level: Not on file  Occupational History  . 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 Smoking status: Former Smoker    Packs/day: 2.00    Years: 22.00    Pack years: 44.00    Types: Cigarettes    Quit date: 04/23/1985    Years since quitting: 34.5  . Smokeless tobacco: Never Used  Vaping Use  . Vaping Use: Never used  Substance and Sexual Activity  . Alcohol use: Yes    Alcohol/week: 4.0 standard drinks    Types: 4 Glasses of wine per week    Comment: 1 drink a  day  . Drug use: No  . Sexual activity: Yes    Birth control/protection: Surgical  Other Topics Concern  . Not on file  Social History Narrative   ** Merged History Encounter **       Domestic partner   Regular exercise-no   Right handed   Caffeine use-- 2 cups coffee daily, rare soda  Social Determinants of Health   Financial Resource Strain:   . Difficulty of Paying Living Expenses:   Food Insecurity:   . Worried About Charity fundraiser in the Last Year:   . Arboriculturist in the Last Year:   Transportation Needs:   . Film/video editor (Medical):   Marland Kitchen Lack of Transportation (Non-Medical):     Physical Activity:   . Days of Exercise per Week:   . Minutes of Exercise per Session:   Stress:   . Feeling of Stress :   Social Connections:   . Frequency of Communication with Friends and Family:   . Frequency of Social Gatherings with Friends and Family:   . Attends Religious Services:   . Active Member of Clubs or Organizations:   . Attends Archivist Meetings:   Marland Kitchen Marital Status:     Family History  Problem Relation Age of Onset  . Hyperlipidemia Other   . Hypertension Other   . Cancer Other        lung, esophagus, stomach  . Stroke Other   . Heart disease Father   . Heart disease Sister   . Heart attack Sister   . Heart disease Brother   . Heart attack Brother   . Stroke Son     Review of Systems  Constitutional: Negative for chills and fever.  Respiratory: Negative for cough, shortness of breath and wheezing.   Cardiovascular: Positive for palpitations (one episode of SVT - took extra metopolol and it went away within 10 min). Negative for chest pain and leg swelling.  Neurological: Negative for light-headedness and headaches.       Objective:   Vitals:   11/11/19 0813  BP: (!) 138/60  Pulse: 60  Temp: 98 F (36.7 C)  SpO2: 96%   BP Readings from Last 3 Encounters:  11/11/19 (!) 138/60  07/29/19 (!) 149/65  05/29/19 (!) 128/56   Wt Readings from Last 3 Encounters:  11/11/19 157 lb (71.2 kg)  07/29/19 157 lb (71.2 kg)  05/29/19 157 lb (71.2 kg)   Body mass index is 27.81 kg/m.   Physical Exam    Constitutional: Appears well-developed and well-nourished. No distress.  HENT:  Head: Normocephalic and atraumatic.  Neck: Neck supple. No tracheal deviation present. No thyromegaly present.  No cervical lymphadenopathy Cardiovascular: Normal rate, regular rhythm and normal heart sounds.   No murmur heard. Left carotid bruit .  No edema Pulmonary/Chest: Effort normal and breath sounds normal. No respiratory distress. No has no wheezes. No  rales.  Msk: tenderness at R SI joint, no lateral hip pain or L spine pain Skin: Skin is warm and dry. Not diaphoretic.  Psychiatric: Normal mood and affect. Behavior is normal.      Assessment & Plan:    See Problem List for Assessment and Plan of chronic medical problems.    This visit occurred during the SARS-CoV-2 public health emergency.  Safety protocols were in place, including screening questions prior to the visit, additional usage of staff PPE, and extensive cleaning of exam room while observing appropriate contact time as indicated for disinfecting solutions.

## 2019-11-10 NOTE — Patient Instructions (Addendum)
  Blood work was ordered.     Medications reviewed and updated.  Changes include :   none  Your prescription(s) have been submitted to your pharmacy. Please take as directed and contact our office if you believe you are having problem(s) with the medication(s).   Please followup in 6 months   

## 2019-11-11 ENCOUNTER — Other Ambulatory Visit: Payer: Self-pay

## 2019-11-11 ENCOUNTER — Encounter: Payer: Self-pay | Admitting: Internal Medicine

## 2019-11-11 ENCOUNTER — Ambulatory Visit (INDEPENDENT_AMBULATORY_CARE_PROVIDER_SITE_OTHER): Payer: PPO | Admitting: Internal Medicine

## 2019-11-11 VITALS — BP 138/60 | HR 60 | Temp 98.0°F | Ht 63.0 in | Wt 157.0 lb

## 2019-11-11 DIAGNOSIS — R7303 Prediabetes: Secondary | ICD-10-CM

## 2019-11-11 DIAGNOSIS — K219 Gastro-esophageal reflux disease without esophagitis: Secondary | ICD-10-CM

## 2019-11-11 DIAGNOSIS — G8929 Other chronic pain: Secondary | ICD-10-CM

## 2019-11-11 DIAGNOSIS — N3289 Other specified disorders of bladder: Secondary | ICD-10-CM | POA: Diagnosis not present

## 2019-11-11 DIAGNOSIS — M545 Low back pain, unspecified: Secondary | ICD-10-CM

## 2019-11-11 DIAGNOSIS — G4709 Other insomnia: Secondary | ICD-10-CM

## 2019-11-11 DIAGNOSIS — K588 Other irritable bowel syndrome: Secondary | ICD-10-CM

## 2019-11-11 DIAGNOSIS — I10 Essential (primary) hypertension: Secondary | ICD-10-CM

## 2019-11-11 DIAGNOSIS — I5032 Chronic diastolic (congestive) heart failure: Secondary | ICD-10-CM | POA: Diagnosis not present

## 2019-11-11 DIAGNOSIS — I2510 Atherosclerotic heart disease of native coronary artery without angina pectoris: Secondary | ICD-10-CM | POA: Diagnosis not present

## 2019-11-11 DIAGNOSIS — F418 Other specified anxiety disorders: Secondary | ICD-10-CM | POA: Diagnosis not present

## 2019-11-11 DIAGNOSIS — E785 Hyperlipidemia, unspecified: Secondary | ICD-10-CM

## 2019-11-11 MED ORDER — FAMOTIDINE 40 MG PO TABS
40.0000 mg | ORAL_TABLET | Freq: Every day | ORAL | 3 refills | Status: DC
Start: 2019-11-11 — End: 2020-10-08

## 2019-11-11 NOTE — Assessment & Plan Note (Signed)
Chronic intermittent Takes xanax prn only - rarely takes it  continue

## 2019-11-11 NOTE — Assessment & Plan Note (Signed)
Chronic Check lipid panel  Continue daily statin Regular exercise and healthy diet encouraged  

## 2019-11-11 NOTE — Assessment & Plan Note (Addendum)
Chronic Right lower back pain since sept when she fell Tenderness at R SI joint  -- likely SI joint dysfunction or OA Taking celebrex intermittently - advised to keep to a minimum due to being on plavix and ASA Try ice, heat, topical meds Deferred sports med referral at this time

## 2019-11-11 NOTE — Assessment & Plan Note (Signed)
Chronic No concerning symptoms continue current meds Labs Following with cardio

## 2019-11-11 NOTE — Assessment & Plan Note (Signed)
Chronic Check a1c Low sugar / carb diet Stressed regular exercise  

## 2019-11-11 NOTE — Assessment & Plan Note (Signed)
Chronic BP well controlled Current regimen effective and well tolerated Continue current medications at current doses cmp  

## 2019-11-11 NOTE — Assessment & Plan Note (Signed)
Chronic Premarin cream has helped with the bladder spasms Will continue

## 2019-11-11 NOTE — Assessment & Plan Note (Signed)
Chronic Controlled, stable Continue current dose of medication Trazodone 50 mg

## 2019-11-11 NOTE — Assessment & Plan Note (Signed)
Chronic GERD controlled, rarely GERD Continue daily medication

## 2019-11-11 NOTE — Assessment & Plan Note (Signed)
Chronic Takes imodium most days controlled

## 2019-11-11 NOTE — Assessment & Plan Note (Signed)
Chronic euvolemic Management per cards

## 2019-11-12 LAB — LIPID PANEL
Cholesterol: 161 mg/dL (ref <200)
HDL: 66 mg/dL (ref >=50)
LDL Cholesterol (Calc): 65 mg/dL (calc)
Non-HDL Cholesterol (Calc): 95 mg/dL (calc) (ref <130)
Total CHOL/HDL Ratio: 2.4 (calc) (ref <5.0)
Triglycerides: 244 mg/dL — ABNORMAL HIGH (ref <150)

## 2019-11-12 LAB — HEMOGLOBIN A1C
Hgb A1c MFr Bld: 5.7 % of total Hgb — ABNORMAL HIGH (ref <5.7)
Mean Plasma Glucose: 117 (calc)
eAG (mmol/L): 6.5 (calc)

## 2019-11-12 LAB — CBC WITH DIFFERENTIAL/PLATELET
Absolute Monocytes: 540 cells/uL (ref 200–950)
Basophils Absolute: 22 cells/uL (ref 0–200)
Basophils Relative: 0.4 %
Eosinophils Absolute: 227 cells/uL (ref 15–500)
Eosinophils Relative: 4.2 %
HCT: 35 % (ref 35.0–45.0)
Hemoglobin: 11.8 g/dL (ref 11.7–15.5)
Lymphs Abs: 896 cells/uL (ref 850–3900)
MCH: 33.3 pg — ABNORMAL HIGH (ref 27.0–33.0)
MCHC: 33.7 g/dL (ref 32.0–36.0)
MCV: 98.9 fL (ref 80.0–100.0)
MPV: 9.8 fL (ref 7.5–12.5)
Monocytes Relative: 10 %
Neutro Abs: 3715 cells/uL (ref 1500–7800)
Neutrophils Relative %: 68.8 %
Platelets: 262 10*3/uL (ref 140–400)
RBC: 3.54 10*6/uL — ABNORMAL LOW (ref 3.80–5.10)
RDW: 12.3 % (ref 11.0–15.0)
Total Lymphocyte: 16.6 %
WBC: 5.4 10*3/uL (ref 3.8–10.8)

## 2019-11-12 LAB — COMPREHENSIVE METABOLIC PANEL
AG Ratio: 2.4 (calc) (ref 1.0–2.5)
ALT: 13 U/L (ref 6–29)
AST: 15 U/L (ref 10–35)
Albumin: 4.5 g/dL (ref 3.6–5.1)
Alkaline phosphatase (APISO): 57 U/L (ref 37–153)
BUN/Creatinine Ratio: 16 (calc) (ref 6–22)
BUN: 16 mg/dL (ref 7–25)
CO2: 29 mmol/L (ref 20–32)
Calcium: 9.9 mg/dL (ref 8.6–10.4)
Chloride: 103 mmol/L (ref 98–110)
Creat: 1.01 mg/dL — ABNORMAL HIGH (ref 0.60–0.93)
Globulin: 1.9 g/dL (calc) (ref 1.9–3.7)
Glucose, Bld: 117 mg/dL — ABNORMAL HIGH (ref 65–99)
Potassium: 4.1 mmol/L (ref 3.5–5.3)
Sodium: 140 mmol/L (ref 135–146)
Total Bilirubin: 0.4 mg/dL (ref 0.2–1.2)
Total Protein: 6.4 g/dL (ref 6.1–8.1)

## 2019-11-23 ENCOUNTER — Other Ambulatory Visit: Payer: Self-pay | Admitting: Internal Medicine

## 2019-12-10 DIAGNOSIS — I700 Atherosclerosis of aorta: Secondary | ICD-10-CM | POA: Diagnosis not present

## 2019-12-10 DIAGNOSIS — M533 Sacrococcygeal disorders, not elsewhere classified: Secondary | ICD-10-CM | POA: Diagnosis not present

## 2019-12-10 DIAGNOSIS — M545 Low back pain: Secondary | ICD-10-CM | POA: Diagnosis not present

## 2019-12-12 ENCOUNTER — Other Ambulatory Visit: Payer: Self-pay | Admitting: Internal Medicine

## 2019-12-16 DIAGNOSIS — M545 Low back pain: Secondary | ICD-10-CM | POA: Diagnosis not present

## 2019-12-19 DIAGNOSIS — M545 Low back pain: Secondary | ICD-10-CM | POA: Diagnosis not present

## 2020-01-07 ENCOUNTER — Encounter: Payer: Self-pay | Admitting: Internal Medicine

## 2020-01-07 ENCOUNTER — Other Ambulatory Visit: Payer: Self-pay | Admitting: Internal Medicine

## 2020-01-10 DIAGNOSIS — Z20822 Contact with and (suspected) exposure to covid-19: Secondary | ICD-10-CM | POA: Diagnosis not present

## 2020-01-13 DIAGNOSIS — R05 Cough: Secondary | ICD-10-CM | POA: Diagnosis not present

## 2020-01-22 ENCOUNTER — Other Ambulatory Visit: Payer: Self-pay | Admitting: Internal Medicine

## 2020-01-22 ENCOUNTER — Other Ambulatory Visit: Payer: Self-pay | Admitting: Family

## 2020-01-22 DIAGNOSIS — Z1231 Encounter for screening mammogram for malignant neoplasm of breast: Secondary | ICD-10-CM

## 2020-01-28 ENCOUNTER — Encounter: Payer: Self-pay | Admitting: Internal Medicine

## 2020-02-14 DIAGNOSIS — M533 Sacrococcygeal disorders, not elsewhere classified: Secondary | ICD-10-CM | POA: Insufficient documentation

## 2020-02-16 ENCOUNTER — Ambulatory Visit
Admission: RE | Admit: 2020-02-16 | Discharge: 2020-02-16 | Disposition: A | Discharge status: Home / Self Care | Payer: PPO | Source: Ambulatory Visit | Attending: Internal Medicine | Admitting: Internal Medicine

## 2020-02-16 ENCOUNTER — Other Ambulatory Visit: Payer: Self-pay

## 2020-02-16 ENCOUNTER — Other Ambulatory Visit: Payer: Self-pay | Admitting: Internal Medicine

## 2020-02-16 DIAGNOSIS — Z1231 Encounter for screening mammogram for malignant neoplasm of breast: Secondary | ICD-10-CM

## 2020-02-26 ENCOUNTER — Other Ambulatory Visit: Payer: Self-pay | Admitting: Internal Medicine

## 2020-03-03 DIAGNOSIS — M533 Sacrococcygeal disorders, not elsewhere classified: Secondary | ICD-10-CM | POA: Diagnosis not present

## 2020-04-16 DIAGNOSIS — G5601 Carpal tunnel syndrome, right upper limb: Secondary | ICD-10-CM | POA: Diagnosis not present

## 2020-04-16 DIAGNOSIS — Z4789 Encounter for other orthopedic aftercare: Secondary | ICD-10-CM | POA: Insufficient documentation

## 2020-04-16 DIAGNOSIS — G8918 Other acute postprocedural pain: Secondary | ICD-10-CM | POA: Diagnosis not present

## 2020-04-16 DIAGNOSIS — M13131 Monoarthritis, not elsewhere classified, right wrist: Secondary | ICD-10-CM | POA: Diagnosis not present

## 2020-04-16 DIAGNOSIS — M1811 Unilateral primary osteoarthritis of first carpometacarpal joint, right hand: Secondary | ICD-10-CM | POA: Diagnosis not present

## 2020-04-28 DIAGNOSIS — M79641 Pain in right hand: Secondary | ICD-10-CM | POA: Diagnosis not present

## 2020-04-28 DIAGNOSIS — M189 Osteoarthritis of first carpometacarpal joint, unspecified: Secondary | ICD-10-CM | POA: Diagnosis not present

## 2020-04-28 DIAGNOSIS — G5603 Carpal tunnel syndrome, bilateral upper limbs: Secondary | ICD-10-CM | POA: Diagnosis not present

## 2020-04-28 DIAGNOSIS — Z4789 Encounter for other orthopedic aftercare: Secondary | ICD-10-CM | POA: Diagnosis not present

## 2020-04-28 DIAGNOSIS — G5601 Carpal tunnel syndrome, right upper limb: Secondary | ICD-10-CM | POA: Diagnosis not present

## 2020-05-12 NOTE — Patient Instructions (Signed)
  Blood work was ordered.     Medications changes include :   Increase trazodone to 100 mg nightly  Your prescription(s) have been submitted to your pharmacy. Please take as directed and contact our office if you believe you are having problem(s) with the medication(s).      Please followup in 6 months

## 2020-05-12 NOTE — Progress Notes (Signed)
Subjective:    Patient ID: Susan Gillespie, female    DOB: January 02, 1946, 75 y.o.   MRN: [id number]  HPI The patient is here for follow up of their chronic medical problems, including CAD, CHF, htn, hyperlipidemia, prediabetes, neck pain, OA, IBS, HRT, insomnia, anxiety  She is active, but does not do formal exercise.  She is taking all of her medications as prescribed.    She di something to her back - saw Dr Ethelene Halamos.  Did PT which made it worse.  She had a couple of epidurals which helped.    If she gets up at night - vision is blurry.  In morning the eye is a little painful.     She is using a a sinex nasal spray nightly.    Medications and allergies reviewed with patient and updated if appropriate.  Patient Active Problem List   Diagnosis Date Noted  . Lower back pain 11/11/2019  . Bladder spasms 10/09/2017  . Migraine without status migrainosus, not intractable 10/09/2017  . Spondylosis of cervical region without myelopathy or radiculopathy 10/09/2017  . Insomnia 10/09/2017  . IBS (irritable bowel syndrome) 10/09/2017  . Chronic diastolic CHF (congestive heart failure), NYHA class 1 (HCC) 02/20/2017  . CAD in native artery 11/10/2016  . Carotid stenosis, bilateral 08/31/2016  . 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HCC) 01/05/2016  . Pernicious anemia 09/15/2013  . B12 deficiency anemia 09/15/2013  . Situational anxiety 12/20/2012  . Spinal stenosis of lumbar region 07/05/2012  . Cerebral infarction (HCC) 06/28/2012  . Bleeding tendency (HCC) 05/16/2012  . Paroxysmal supraventricular tachycardia (HCC) 09/25/2011  . Essential hypertension, benign 08/22/2010  . Prediabetes 04/28/2009  . Hyperlipidemia LDL goal <70 12/16/2008  . GERD 12/16/2008  . Osteoarthritis 12/16/2008    Current Outpatient Medications on File Prior to Visit  Medication Sig Dispense Refill  . acetaminophen (TYLENOL) 650 MG CR tablet Take 1,300 mg by mouth every 8 (eight) hours as needed for pain.     Marland Kitchen.  ALPRAZolam (XANAX) 0.5 MG tablet TAKE 0.5 TABLETS (0.25 MG TOTAL) BY MOUTH AT BEDTIME AS NEEDED FOR ANXIETY OR SLEEP. 30 tablet 0  . aspirin 325 MG tablet Take 1 tablet (325 mg total) by mouth daily. 30 tablet 0  . celecoxib (CELEBREX) 200 MG capsule Take 200 mg by mouth daily as needed for mild pain.    . cetirizine (ZYRTEC) 10 MG tablet Take 10 mg by mouth every morning.    . clopidogrel (PLAVIX) 75 MG tablet TAKE 1 TABLET BY MOUTH EVERY DAY 90 tablet 1  . diltiazem (CARDIZEM CD) 180 MG 24 hr capsule Take 1 capsule (180 mg total) by mouth at bedtime. 90 capsule 3  . famotidine (PEPCID) 40 MG tablet Take 1 tablet (40 mg total) by mouth daily. 90 tablet 3  . ferrous sulfate 325 (65 FE) MG tablet Take 325 mg by mouth daily with breakfast.    . furosemide (LASIX) 40 MG tablet Take 0.5 tablets (20 mg total) by mouth daily. Take 0.5 tablets (20 mg) daily. Take and additional 0.5 tablet as needed for edema. 90 tablet 3  . loperamide (IMODIUM A-D) 2 MG tablet Take 2 mg by mouth every other day.     . losartan (COZAAR) 50 MG tablet TAKE 1 TABLET BY MOUTH EVERY DAY 90 tablet 0  . Magnesium 250 MG TABS Take 250 mg by mouth daily.    . methocarbamol (ROBAXIN) 500 MG tablet Take by mouth.    . potassium chloride (KLOR-CON)  10 MEQ tablet TAKE 1 TABLET BY MOUTH EVERY DAY 90 tablet 1  . PREMARIN vaginal cream 1 APPLICATOR FULL INTO VAGINA TWICE A WEEK 90 g 4  . rosuvastatin (CRESTOR) 20 MG tablet TAKE 1 TABLET BY MOUTH EVERYDAY AT BEDTIME 90 tablet 1  . SUMAtriptan (IMITREX) 50 MG tablet Take 50 mg by mouth every 2 (two) hours as needed for migraine. May repeat in 2 hours if headache persists or recurs.    . vitamin B-12 (CYANOCOBALAMIN) 1000 MCG tablet Take 1,000 mcg by mouth daily.    . nitroGLYCERIN (NITROSTAT) 0.4 MG SL tablet Place 1 tablet (0.4 mg total) under the tongue every 5 (five) minutes as needed for chest pain. 25 tablet 3   No current facility-administered medications on file prior to visit.     Past Medical History:  Diagnosis Date  . Anxiety   . Artery stenosis (HCC)    left internal carotid, left subclavian  . Arthritis   . Bleeding tendency ([place]) 05/16/2012   Hx post op bleeding (epidural hematoma s/p ACDF '10; LLQ hematoma due to superficial vessel fascial bleed post colon resection; Normal platelet count; saw Dr. [name] '14  . CAD in native artery    a. mod to mod-severe CAD by cath 10/2016, med rx.  . Carotid stenosis, bilateral 08/31/2016   1-39% right and 50-69% lefft ICA stenosis dopplers 04/2019  . Chronic diastolic CHF (congestive heart failure) ([place])   . Colon polyp   . Diverticulitis   . Family history of cardiovascular disease   . Fracture of right fibula   . GERD (gastroesophageal reflux disease)   . Headache(784.0)    mirgraines - history of  . Hearing aid worn    B/L  . HOH (hard of hearing)    wears hearing aids  . Hyperlipidemia   . Hypertension   . IBS (irritable bowel syndrome)    followed by Dr. [name]  . Liver cyst   . Melanoma ([place])    removed from back 04/2013  . Shortness of breath dyspnea    with exertion  . Stroke ([place]) 06/2012   mild  . SVT (supraventricular tachycardia) ([place])   . Urinary urgency   . UTI (lower urinary tract infection)    x 2 since June 2016  . Wears glasses     Past Surgical History:  Procedure Laterality Date  . ABDOMINAL HYSTERECTOMY    . ANGIOPLASTY     stent in subclavian LEFT 2016, angioplasty of stent 2017  . APPENDECTOMY    . AUGMENTATION MAMMAPLASTY    . BREAST EXCISIONAL BIOPSY Left    benign  . BREAST SURGERY     BIL breast augmentation  . CHOLECYSTECTOMY    . COLON SURGERY     Sigmoid Colectomy, returned for post op bleeding  . COLONOSCOPY W/ POLYPECTOMY    . COLONOSCOPY WITH PROPOFOL N/A 05/05/2016   Procedure: COLONOSCOPY WITH PROPOFOL;  Surgeon: Alphonsa Overall, MD;  Location: [facility];  Service: General;  Laterality: N/A;  . ESOPHAGOGASTRODUODENOSCOPY (EGD) WITH PROPOFOL N/A  05/05/2016   Procedure: ESOPHAGOGASTRODUODENOSCOPY (EGD) WITH PROPOFOL;  Surgeon: Alphonsa Overall, MD;  Location: [facility];  Service: General;  Laterality: N/A;  . EYE SURGERY     eyelid drooping fixed  . IR ANGIO INTRA EXTRACRAN SEL COM CAROTID INNOMINATE BILAT MOD SED  10/16/2018  . IR ANGIO VERTEBRAL SEL SUBCLAVIAN INNOMINATE UNI L MOD SED  10/16/2018  . IR ANGIO VERTEBRAL SEL VERTEBRAL UNI R MOD SED  10/16/2018  .  IR GENERIC HISTORICAL  12/24/2015   IR ANGIO INTRA EXTRACRAN SEL COM CAROTID INNOMINATE BILAT MOD SED 12/24/2015 Luanne Bras, MD [facility]  . IR GENERIC HISTORICAL  12/24/2015   IR ANGIOGRAM EXTREMITY LEFT 12/24/2015 Luanne Bras, MD [facility]  . IR GENERIC HISTORICAL  12/24/2015   IR ANGIO VERTEBRAL SEL VERTEBRAL UNI R MOD SED 12/24/2015 Luanne Bras, MD [facility]  . IR GENERIC HISTORICAL  01/05/2016   IR PTA NON CORO-LOWER EXTREM 01/05/2016 Luanne Bras, MD [facility]  . IR GENERIC HISTORICAL  03/07/2016   IR RADIOLOGIST EVAL & MGMT 03/07/2016 [facility]  . JOINT REPLACEMENT Left    thumb  . LEFT HEART CATH AND CORONARY ANGIOGRAPHY N/A 11/10/2016   Procedure: Left Heart Cath and Coronary Angiography;  Surgeon: Belva Crome, MD;  Location: [facility];  Service: Cardiovascular;  Laterality: N/A;  . MASTOID DEBRIDEMENT    . MASTOIDECTOMY REVISION    . RADIOLOGY WITH ANESTHESIA N/A 11/09/2014   Procedure: STENT PLACEMENT;  Surgeon: Luanne Bras, MD;  Location: [place];  Service: Radiology;  Laterality: N/A;  . RADIOLOGY WITH ANESTHESIA N/A 05/31/2015   Procedure: RADIOLOGY WITH ANESTHESIA;  Surgeon: Luanne Bras, MD;  Location: [place];  Service: Radiology;  Laterality: N/A;  . RADIOLOGY WITH ANESTHESIA N/A 01/05/2016   Procedure: RADIOLOGY WITH ANESTHESIA ANGIOPLASTY WITH STENTNG;  Surgeon: Luanne Bras, MD;  Location: [facility];  Service: Radiology;  Laterality: N/A;  . RADIOLOGY WITH ANESTHESIA N/A 10/16/2018   Procedure: STENTING;  Surgeon:  Luanne Bras, MD;  Location: [facility];  Service: Radiology;  Laterality: N/A;  . skin cancer excised  04/2013   Melonoma  . SPINE SURGERY     cervical fusion, returned to OR for post op  bleeding  . TONSILLECTOMY    . TONSILLECTOMY    . TUBAL LIGATION    . VAGINA SURGERY     anterior posterior repair    Social History   Socioeconomic History  . Marital status: Divorced    Spouse name: Not on file  . Number of children: 2  . Years of education: 71  . Highest education level: Not on file  Occupational History  . 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 Smoking status: Former Smoker    Packs/day: 2.00    Years: 22.00    Pack years: 44.00    Types: Cigarettes    Quit date: 04/23/1985    Years since quitting: 35.0  . Smokeless tobacco: Never Used  Vaping Use  . Vaping Use: Never used  Substance and Sexual Activity  . Alcohol use: Yes    Alcohol/week: 4.0 standard drinks    Types: 4 Glasses of wine per week    Comment: 1 drink a  day  . Drug use: No  . Sexual activity: Yes    Birth control/protection: Surgical  Other Topics Concern  . Not on file  Social History Narrative   ** Merged History Encounter **       Domestic partner   Regular exercise-no   Right handed   Caffeine use-- 2 cups coffee daily, rare soda   Social Determinants of Health   Financial Resource Strain: Not on file  Food Insecurity: Not on file  Transportation Needs: Not on file  Physical Activity: Not on file  Stress: Not on file  Social Connections: Not on file    Family History  Problem Relation Age of Onset  . Hyperlipidemia Other   . Hypertension Other   . Cancer Other        lung, esophagus, stomach  . Stroke Other   . Heart disease Father   . Heart disease Sister   . Heart attack Sister   . Heart disease Brother   . Heart attack Brother   . Stroke Son     Review of Systems  Constitutional: Negative  for chills and fever.  Respiratory: Negative for cough, shortness of breath and wheezing.   Cardiovascular: Negative for chest pain, palpitations and leg swelling.  Neurological: Negative for light-headedness and headaches.       Objective:   Vitals:   05/13/20 1016  BP: 128/62  Pulse: 60  Temp: 98.1 F (36.7 C)  SpO2: 98%   BP Readings from Last 3 Encounters:  05/13/20 128/62  11/11/19 (!) 138/60  07/29/19 (!) 149/65   Wt Readings from Last 3 Encounters:  05/13/20 156 lb 9.6 oz (71 kg)  11/11/19 157 lb (71.2 kg)  07/29/19 157 lb (71.2 kg)   Body mass index is 27.74 kg/m.   Physical Exam    Constitutional: Appears well-developed and well-nourished. No distress.  HENT:  Head: Normocephalic and atraumatic.  Neck: Neck supple. No tracheal deviation present. No thyromegaly present.  No cervical lymphadenopathy Cardiovascular: Normal rate, regular rhythm and normal heart sounds.   No murmur heard. No carotid bruit .  No edema Pulmonary/Chest: Effort normal and breath sounds normal. No respiratory distress. No has no wheezes. No rales.  Skin: Skin is warm and dry. Not diaphoretic.  Psychiatric: Normal mood and affect. Behavior is normal.      Assessment & Plan:    See Problem List for Assessment and Plan of chronic medical problems.    This visit occurred during the SARS-CoV-2 public health emergency.  Safety protocols were in place, including screening questions prior to the visit, additional usage of staff PPE, and extensive cleaning of exam room while observing appropriate contact time as indicated for disinfecting solutions.

## 2020-05-13 ENCOUNTER — Ambulatory Visit (INDEPENDENT_AMBULATORY_CARE_PROVIDER_SITE_OTHER): Payer: PPO | Admitting: Internal Medicine

## 2020-05-13 ENCOUNTER — Other Ambulatory Visit: Payer: Self-pay

## 2020-05-13 ENCOUNTER — Encounter: Payer: Self-pay | Admitting: Internal Medicine

## 2020-05-13 VITALS — BP 128/62 | HR 60 | Temp 98.1°F | Ht 63.0 in | Wt 156.6 lb

## 2020-05-13 DIAGNOSIS — F418 Other specified anxiety disorders: Secondary | ICD-10-CM

## 2020-05-13 DIAGNOSIS — M189 Osteoarthritis of first carpometacarpal joint, unspecified: Secondary | ICD-10-CM | POA: Diagnosis not present

## 2020-05-13 DIAGNOSIS — I2510 Atherosclerotic heart disease of native coronary artery without angina pectoris: Secondary | ICD-10-CM

## 2020-05-13 DIAGNOSIS — I10 Essential (primary) hypertension: Secondary | ICD-10-CM

## 2020-05-13 DIAGNOSIS — M8949 Other hypertrophic osteoarthropathy, multiple sites: Secondary | ICD-10-CM

## 2020-05-13 DIAGNOSIS — G4709 Other insomnia: Secondary | ICD-10-CM

## 2020-05-13 DIAGNOSIS — D519 Vitamin B12 deficiency anemia, unspecified: Secondary | ICD-10-CM

## 2020-05-13 DIAGNOSIS — R7303 Prediabetes: Secondary | ICD-10-CM | POA: Diagnosis not present

## 2020-05-13 DIAGNOSIS — M159 Polyosteoarthritis, unspecified: Secondary | ICD-10-CM

## 2020-05-13 DIAGNOSIS — I5032 Chronic diastolic (congestive) heart failure: Secondary | ICD-10-CM

## 2020-05-13 DIAGNOSIS — G5603 Carpal tunnel syndrome, bilateral upper limbs: Secondary | ICD-10-CM | POA: Diagnosis not present

## 2020-05-13 DIAGNOSIS — M1811 Unilateral primary osteoarthritis of first carpometacarpal joint, right hand: Secondary | ICD-10-CM | POA: Diagnosis not present

## 2020-05-13 DIAGNOSIS — K588 Other irritable bowel syndrome: Secondary | ICD-10-CM

## 2020-05-13 DIAGNOSIS — Z4789 Encounter for other orthopedic aftercare: Secondary | ICD-10-CM | POA: Diagnosis not present

## 2020-05-13 DIAGNOSIS — M79641 Pain in right hand: Secondary | ICD-10-CM | POA: Diagnosis not present

## 2020-05-13 LAB — CBC WITH DIFFERENTIAL/PLATELET
Basophils Absolute: 0 10*3/uL (ref 0.0–0.1)
Basophils Relative: 0.8 % (ref 0.0–3.0)
Eosinophils Absolute: 0.2 10*3/uL (ref 0.0–0.7)
Eosinophils Relative: 3.8 % (ref 0.0–5.0)
HCT: 36.3 % (ref 36.0–46.0)
Hemoglobin: 12.1 g/dL (ref 12.0–15.0)
Lymphocytes Relative: 21.9 % (ref 12.0–46.0)
Lymphs Abs: 1 10*3/uL (ref 0.7–4.0)
MCHC: 33.4 g/dL (ref 30.0–36.0)
MCV: 99.3 fl (ref 78.0–100.0)
Monocytes Absolute: 0.4 10*3/uL (ref 0.1–1.0)
Monocytes Relative: 10 % (ref 3.0–12.0)
Neutro Abs: 2.8 10*3/uL (ref 1.4–7.7)
Neutrophils Relative %: 63.5 % (ref 43.0–77.0)
Platelets: 263 10*3/uL (ref 150.0–400.0)
RBC: 3.65 Mil/uL — ABNORMAL LOW (ref 3.87–5.11)
RDW: 13.4 % (ref 11.5–15.5)
WBC: 4.5 10*3/uL (ref 4.0–10.5)

## 2020-05-13 LAB — COMPREHENSIVE METABOLIC PANEL
ALT: 30 U/L (ref 0–35)
AST: 18 U/L (ref 0–37)
Albumin: 4.4 g/dL (ref 3.5–5.2)
Alkaline Phosphatase: 62 U/L (ref 39–117)
BUN: 16 mg/dL (ref 6–23)
CO2: 26 mEq/L (ref 19–32)
Calcium: 9.9 mg/dL (ref 8.4–10.5)
Chloride: 105 mEq/L (ref 96–112)
Creatinine, Ser: 0.99 mg/dL (ref 0.40–1.20)
GFR: 55.55 mL/min — ABNORMAL LOW (ref >60.00)
Glucose, Bld: 108 mg/dL — ABNORMAL HIGH (ref 70–99)
Potassium: 4.2 mEq/L (ref 3.5–5.1)
Sodium: 139 mEq/L (ref 135–145)
Total Bilirubin: 0.4 mg/dL (ref 0.2–1.2)
Total Protein: 6.8 g/dL (ref 6.0–8.3)

## 2020-05-13 LAB — LIPID PANEL
Cholesterol: 163 mg/dL (ref 0–200)
HDL: 78.8 mg/dL (ref >39.00)
LDL Cholesterol: 49 mg/dL (ref 0–99)
NonHDL: 84.01
Total CHOL/HDL Ratio: 2
Triglycerides: 176 mg/dL — ABNORMAL HIGH (ref 0.0–149.0)
VLDL: 35.2 mg/dL (ref 0.0–40.0)

## 2020-05-13 LAB — VITAMIN B12: Vitamin B-12: >1526 pg/mL — ABNORMAL HIGH (ref 211–911)

## 2020-05-13 LAB — HEMOGLOBIN A1C: Hgb A1c MFr Bld: 5.9 % (ref 4.6–6.5)

## 2020-05-13 MED ORDER — TRAZODONE HCL 100 MG PO TABS: 100.0000 mg | ORAL_TABLET | Freq: Every day | ORAL | 1 refills | Status: DC

## 2020-05-13 NOTE — Assessment & Plan Note (Signed)
Chronic Check a1c Low sugar / carb diet Stressed regular exercise  

## 2020-05-13 NOTE — Assessment & Plan Note (Signed)
Chronic Clinically euvolemic Following with cardiology Taking Lasix 40 mg three times a week and 20 mg ROW

## 2020-05-13 NOTE — Assessment & Plan Note (Signed)
Chronic Taking B12 daily Check B12 level 

## 2020-05-13 NOTE — Assessment & Plan Note (Signed)
Chronic BP well controlled Continue diltiazem 180 g daily, lasix 40 mg twice a week and 20 mg ROW, losartan 50 mg daily,  cmp

## 2020-05-13 NOTE — Assessment & Plan Note (Signed)
Chronic Following with cardiology GI symptoms suggestive of angina Continue current medication CBC, CMP, TSH, lipid panel

## 2020-05-13 NOTE — Assessment & Plan Note (Signed)
Chronic Situational Continue alprazolam 0.5 mg daily as needed

## 2020-05-13 NOTE — Assessment & Plan Note (Signed)
Chronic Trazodone 50 mg not working as well at the 100 mg  Will increase trazodone to 100 mg nightly

## 2020-05-13 NOTE — Assessment & Plan Note (Signed)
Chronic Taking celebrex daily prn only

## 2020-05-17 ENCOUNTER — Other Ambulatory Visit: Payer: Self-pay | Admitting: Cardiology

## 2020-05-17 DIAGNOSIS — T1501XA Foreign body in cornea, right eye, initial encounter: Secondary | ICD-10-CM | POA: Diagnosis not present

## 2020-05-17 DIAGNOSIS — H353121 Nonexudative age-related macular degeneration, left eye, early dry stage: Secondary | ICD-10-CM | POA: Diagnosis not present

## 2020-05-17 DIAGNOSIS — Z961 Presence of intraocular lens: Secondary | ICD-10-CM | POA: Diagnosis not present

## 2020-05-17 DIAGNOSIS — H353112 Nonexudative age-related macular degeneration, right eye, intermediate dry stage: Secondary | ICD-10-CM | POA: Diagnosis not present

## 2020-05-18 ENCOUNTER — Other Ambulatory Visit: Payer: Self-pay | Admitting: Cardiology

## 2020-05-23 ENCOUNTER — Other Ambulatory Visit: Payer: Self-pay | Admitting: Internal Medicine

## 2020-05-27 DIAGNOSIS — M25641 Stiffness of right hand, not elsewhere classified: Secondary | ICD-10-CM | POA: Diagnosis not present

## 2020-05-27 DIAGNOSIS — M79641 Pain in right hand: Secondary | ICD-10-CM | POA: Diagnosis not present

## 2020-05-27 DIAGNOSIS — M1811 Unilateral primary osteoarthritis of first carpometacarpal joint, right hand: Secondary | ICD-10-CM | POA: Diagnosis not present

## 2020-05-27 DIAGNOSIS — Z4789 Encounter for other orthopedic aftercare: Secondary | ICD-10-CM | POA: Diagnosis not present

## 2020-06-01 ENCOUNTER — Ambulatory Visit: Payer: PPO | Admitting: Cardiology

## 2020-06-01 ENCOUNTER — Encounter: Payer: Self-pay | Admitting: Cardiology

## 2020-06-01 ENCOUNTER — Other Ambulatory Visit: Payer: Self-pay

## 2020-06-01 VITALS — BP 132/54 | HR 66 | Ht 63.25 in | Wt 153.8 lb

## 2020-06-01 DIAGNOSIS — E785 Hyperlipidemia, unspecified: Secondary | ICD-10-CM | POA: Diagnosis not present

## 2020-06-01 DIAGNOSIS — I771 Stricture of artery: Secondary | ICD-10-CM

## 2020-06-01 DIAGNOSIS — I5032 Chronic diastolic (congestive) heart failure: Secondary | ICD-10-CM

## 2020-06-01 DIAGNOSIS — I471 Supraventricular tachycardia: Secondary | ICD-10-CM

## 2020-06-01 DIAGNOSIS — I10 Essential (primary) hypertension: Secondary | ICD-10-CM | POA: Diagnosis not present

## 2020-06-01 DIAGNOSIS — I2510 Atherosclerotic heart disease of native coronary artery without angina pectoris: Secondary | ICD-10-CM

## 2020-06-01 DIAGNOSIS — I6523 Occlusion and stenosis of bilateral carotid arteries: Secondary | ICD-10-CM | POA: Diagnosis not present

## 2020-06-01 NOTE — Progress Notes (Signed)
Cardiology Office Note:    Date:  06/01/2020   ID:  [name], DOB 09/26/1945, MRN [id number]  PCP:  Binnie Rail, MD  Cardiologist:  Fransico Him, MD    Referring MD: Binnie Rail, MD   Chief Complaint  Patient presents with  . Coronary Artery Disease  . Hypertension  . Hyperlipidemia  . Congestive Heart Failure    History of Present Illness:    Susan Gillespie is a 75 y.o. female with a hx of paroxysmal supraventricular tachycardia. She also has a history of left subclavian steal. She underwent stenting by Dr. Judi Cong and then repeat angioplasty on 01/05/2016 down to 80-85% patency.Dopplers 05/2017 with > 50% stenosis with patent stent in the left subclavian artery.  Repeat dopplers in 11/2017 showed 1-39% bilateral carotid stenosis with no mention of subclavian artery flow.    Cardiac CTfor SOB7/12/18 showed calcium score 92nd percentile for age/sex, significant calcific 3V disease with possibly hemodynamically significant stenosis in mid RCA and mLAD; subsequent FFR overread showed functionally significant D1 disease. She underwent LHC 11/10/16 showing moderate to moderately severe 3v calcific disease with anatomy and obstruction similar to that reported on cor CTA - Proximal 50-60% LAD, 70% proximal first diagonal, 50-60% prox-mid Cx ostial 60% and mid 50% RCa, normal LV function with elevated EDP c/w chronic diastolic CHF. She is now on medical management.   She is here today for followup and is doing well.  She denies any chest pain or pressure, SOB, DOE, PND, orthopnea, LE edema, dizziness or syncope. She has had 1 episode of SVT that occurred in the setting of a URI and resolved quickly.  She is compliant with her meds and is tolerating meds with no SE.    Past Medical History:  Diagnosis Date  . Anxiety   . Artery stenosis (HCC)    left internal carotid, left subclavian  . Arthritis   . Bleeding tendency ([place]) 05/16/2012   Hx post op bleeding (epidural hematoma  s/p ACDF '10; LLQ hematoma due to superficial vessel fascial bleed post colon resection; Normal platelet count; saw Dr. [name] '14  . CAD in native artery    a. mod to mod-severe CAD by cath 10/2016, med rx.  . Carotid stenosis, bilateral 08/31/2016   1-39% right and 50-69% lefft ICA stenosis dopplers 04/2019  . Chronic diastolic CHF (congestive heart failure) ([place])   . Colon polyp   . Diverticulitis   . Family history of cardiovascular disease   . Fracture of right fibula   . GERD (gastroesophageal reflux disease)   . Headache(784.0)    mirgraines - history of  . Hearing aid worn    B/L  . HOH (hard of hearing)    wears hearing aids  . Hyperlipidemia   . Hypertension   . IBS (irritable bowel syndrome)    followed by Dr. [name]  . Liver cyst   . Melanoma ([place])    removed from back 04/2013  . Shortness of breath dyspnea    with exertion  . Stroke ([place]) 06/2012   mild  . SVT (supraventricular tachycardia) ([place])   . Urinary urgency   . UTI (lower urinary tract infection)    x 2 since June 2016  . Wears glasses     Past Surgical History:  Procedure Laterality Date  . ABDOMINAL HYSTERECTOMY    . ANGIOPLASTY     stent in subclavian LEFT 2016, angioplasty of stent 2017  . APPENDECTOMY    . AUGMENTATION  MAMMAPLASTY    . BREAST EXCISIONAL BIOPSY Left    benign  . BREAST SURGERY     BIL breast augmentation  . CHOLECYSTECTOMY    . COLON SURGERY     Sigmoid Colectomy, returned for post op bleeding  . COLONOSCOPY W/ POLYPECTOMY    . COLONOSCOPY WITH PROPOFOL N/A 05/05/2016   Procedure: COLONOSCOPY WITH PROPOFOL;  Surgeon: Alphonsa Overall, MD;  Location: [facility];  Service: General;  Laterality: N/A;  . ESOPHAGOGASTRODUODENOSCOPY (EGD) WITH PROPOFOL N/A 05/05/2016   Procedure: ESOPHAGOGASTRODUODENOSCOPY (EGD) WITH PROPOFOL;  Surgeon: Alphonsa Overall, MD;  Location: [facility];  Service: General;  Laterality: N/A;  . EYE SURGERY     eyelid drooping fixed  . IR ANGIO INTRA  EXTRACRAN SEL COM CAROTID INNOMINATE BILAT MOD SED  10/16/2018  . IR ANGIO VERTEBRAL SEL SUBCLAVIAN INNOMINATE UNI L MOD SED  10/16/2018  . IR ANGIO VERTEBRAL SEL VERTEBRAL UNI R MOD SED  10/16/2018  . IR GENERIC HISTORICAL  12/24/2015   IR ANGIO INTRA EXTRACRAN SEL COM CAROTID INNOMINATE BILAT MOD SED 12/24/2015 Luanne Bras, MD [facility]  . IR GENERIC HISTORICAL  12/24/2015   IR ANGIOGRAM EXTREMITY LEFT 12/24/2015 Luanne Bras, MD [facility]  . IR GENERIC HISTORICAL  12/24/2015   IR ANGIO VERTEBRAL SEL VERTEBRAL UNI R MOD SED 12/24/2015 Luanne Bras, MD [facility]  . IR GENERIC HISTORICAL  01/05/2016   IR PTA NON CORO-LOWER EXTREM 01/05/2016 Luanne Bras, MD [facility]  . IR GENERIC HISTORICAL  03/07/2016   IR RADIOLOGIST EVAL & MGMT 03/07/2016 [facility]  . JOINT REPLACEMENT Left    thumb  . LEFT HEART CATH AND CORONARY ANGIOGRAPHY N/A 11/10/2016   Procedure: Left Heart Cath and Coronary Angiography;  Surgeon: Belva Crome, MD;  Location: [facility];  Service: Cardiovascular;  Laterality: N/A;  . MASTOID DEBRIDEMENT    . MASTOIDECTOMY REVISION    . RADIOLOGY WITH ANESTHESIA N/A 11/09/2014   Procedure: STENT PLACEMENT;  Surgeon: Luanne Bras, MD;  Location: [facility];  Service: Radiology;  Laterality: N/A;  . RADIOLOGY WITH ANESTHESIA N/A 05/31/2015   Procedure: RADIOLOGY WITH ANESTHESIA;  Surgeon: Luanne Bras, MD;  Location: [facility];  Service: Radiology;  Laterality: N/A;  . RADIOLOGY WITH ANESTHESIA N/A 01/05/2016   Procedure: RADIOLOGY WITH ANESTHESIA ANGIOPLASTY WITH STENTNG;  Surgeon: Luanne Bras, MD;  Location: [facility];  Service: Radiology;  Laterality: N/A;  . RADIOLOGY WITH ANESTHESIA N/A 10/16/2018   Procedure: STENTING;  Surgeon: Luanne Bras, MD;  Location: [facility];  Service: Radiology;  Laterality: N/A;  . skin cancer excised  04/2013   Melonoma  . SPINE SURGERY     cervical fusion, returned to OR for post op  bleeding  . TONSILLECTOMY     . TONSILLECTOMY    . TUBAL LIGATION    . VAGINA SURGERY     anterior posterior repair    Current Medications: Current Meds  Medication Sig  . acetaminophen (TYLENOL) 650 MG CR tablet Take 1,300 mg by mouth every 8 (eight) hours as needed for pain.   Marland Kitchen ALPRAZolam (XANAX) 0.5 MG tablet TAKE 0.5 TABLETS (0.25 MG TOTAL) BY MOUTH AT BEDTIME AS NEEDED FOR ANXIETY OR SLEEP.  Marland Kitchen aspirin 325 MG tablet Take 1 tablet (325 mg total) by mouth daily.  . celecoxib (CELEBREX) 200 MG capsule Take 200 mg by mouth daily as needed for mild pain.  . cetirizine (ZYRTEC) 10 MG tablet Take 10 mg by mouth every morning.  . clopidogrel (  PLAVIX) 75 MG tablet TAKE 1 TABLET BY MOUTH EVERY DAY  . diltiazem (CARDIZEM CD) 180 MG 24 hr capsule Take 1 capsule (180 mg total) by mouth at bedtime. Pt needs to make appt with provider for further refills.  . famotidine (PEPCID) 40 MG tablet Take 1 tablet (40 mg total) by mouth daily.  . ferrous sulfate 325 (65 FE) MG tablet Take 325 mg by mouth daily with breakfast.  . furosemide (LASIX) 40 MG tablet Take 1/2 tablet by mouth daily. May take additional 1/2 tablet as needed for Edema. Please make yearly appt with Dr. [name] for February 2022 before anymore refills. 1st attempt  . loperamide (IMODIUM A-D) 2 MG tablet Take 2 mg by mouth every other day.   . losartan (COZAAR) 50 MG tablet TAKE 1 TABLET BY MOUTH EVERY DAY  . Magnesium 250 MG TABS Take 250 mg by mouth daily.  . methocarbamol (ROBAXIN) 500 MG tablet Take by mouth.  . potassium chloride (KLOR-CON) 10 MEQ tablet TAKE 1 TABLET BY MOUTH EVERY DAY  . PREMARIN vaginal cream 1 APPLICATOR FULL INTO VAGINA TWICE A WEEK  . rosuvastatin (CRESTOR) 20 MG tablet TAKE 1 TABLET BY MOUTH EVERYDAY AT BEDTIME  . SUMAtriptan (IMITREX) 50 MG tablet Take 50 mg by mouth every 2 (two) hours as needed for migraine. May repeat in 2 hours if headache persists or recurs.  . traZODone (DESYREL) 100 MG tablet Take 1 tablet (100 mg total) by mouth  at bedtime.  . vitamin B-12 (CYANOCOBALAMIN) 1000 MCG tablet Take 1,000 mcg by mouth daily.     Allergies:   Phenergan [promethazine hcl], Daypro [oxaprozin], Lisinopril, Oxycodone, Simvastatin, and Tramadol   Social History   Socioeconomic History  . Marital status: Divorced    Spouse name: Not on file  . Number of children: 2  . Years of education: 51  . Highest education level: Not on file  Occupational History  . 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 Smoking status: Former Smoker    Packs/day: 2.00    Years: 22.00    Pack years: 44.00    Types: Cigarettes    Quit date: 04/23/1985    Years since quitting: 35.1  . Smokeless tobacco: Never Used  Vaping Use  . Vaping Use: Never used  Substance and Sexual Activity  . Alcohol use: Yes    Alcohol/week: 4.0 standard drinks    Types: 4 Glasses of wine per week    Comment: 1 drink a  day  . Drug use: No  . Sexual activity: Yes    Birth control/protection: Surgical  Other Topics Concern  . Not on file  Social History Narrative   ** Merged History Encounter **       Domestic partner   Regular exercise-no   Right handed   Caffeine use-- 2 cups coffee daily, rare soda   Social Determinants of Health   Financial Resource Strain: Not on file  Food Insecurity: Not on file  Transportation Needs: Not on file  Physical Activity: Not on file  Stress: Not on file  Social Connections: Not on file     Family History: The patient's family history includes Cancer in an other family member; Heart attack in her brother and sister; Heart disease in her brother, father, and sister; Hyperlipidemia in an other family member; Hypertension in an other family member; Stroke in her son and another  family member.  ROS:   Please see the history of present illness.    ROS  All other systems reviewed and negative.   EKGs/Labs/Other Studies Reviewed:    The  following studies were reviewed today: Outside labs from [facility]  EKG:  EKG is  ordered today and showed NSR with no ST changes  Recent Labs: 05/13/2020: ALT 30; BUN 16; Creatinine, Ser 0.99; Hemoglobin 12.1; Platelets 263.0; Potassium 4.2; Sodium 139   Recent Lipid Panel    Component Value Date/Time   CHOL 163 05/13/2020 1108   CHOL 165 05/29/2019 1402   TRIG 176.0 (H) 05/13/2020 1108   HDL 78.80 05/13/2020 1108   HDL 84 05/29/2019 1402   CHOLHDL 2 05/13/2020 1108   VLDL 35.2 05/13/2020 1108   LDLCALC 49 05/13/2020 1108   LDLCALC 65 11/11/2019 0856   LDLDIRECT 50.0 10/30/2018 0852    Physical Exam:    VS:  BP (!) 132/54   Pulse 66   Ht 5' 3.25" (1.607 m)   Wt 153 lb 12.8 oz (69.8 kg)   LMP  (LMP Unknown)   BMI 27.03 kg/m     Wt Readings from Last 3 Encounters:  06/01/20 153 lb 12.8 oz (69.8 kg)  05/13/20 156 lb 9.6 oz (71 kg)  11/11/19 157 lb (71.2 kg)     GEN: Well nourished, well developed in no acute distress HEENT: Normal NECK: No JVD; left carotid doppler LYMPHATICS: No lymphadenopathy CARDIAC:RRR, no murmurs, rubs, gallops RESPIRATORY:  Clear to auscultation without rales, wheezing or rhonchi  ABDOMEN: Soft, non-tender, non-distended MUSCULOSKELETAL:  No edema; No deformity  SKIN: Warm and dry NEUROLOGIC:  Alert and oriented x 3 PSYCHIATRIC:  Normal affect    ASSESSMENT:    1. CAD in native artery   2. Chronic diastolic CHF (congestive heart failure), NYHA class 1 (HCC)   3. Carotid stenosis, bilateral   4. Paroxysmal supraventricular tachycardia ([place])   5. Essential hypertension, benign   6. 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HCC)   7. Hyperlipidemia LDL goal <70    PLAN:    In order of problems listed above:  1.  ASCAD  - LHC 11/10/16 showed moderate to moderately severe 3v calcific disease with proximal 50-60% LAD, 70% proximal first diagonal, 50-60% prox-mid Cx ostial 60% and mid 50% RCA.   -she has been doing well with no angina -continue ASA 81mg   daily, Plavix 75mg  daily and statin  2.  Chronic diastolic CHF  -she does not appear volume overloaded on exam today -weight is stable and she denies any SOB or LE edema -continue Lasix 20mg  daily and 20mg  PRN for weight gain or LE edema -labs reviewed from 04/2020 with SCr 0.99 and K+ 4.2  3.  Carotid stenosis bilaterally  -1-39% RICA and 46-56% LICA stenosis by dopplers 04-2019 -continue statin and ASA. -followed by Dr. Estanislado Pandy  4.  PSVT  -she has 1 episode of palpitations and SVT last fall in setting of URI that resolved quickly -continue Cardizem CD 180mg  daily and Lopressor PRN for breakthrough   5.  HTN -BP is well controlled on exam today -continue Cardizem CD 180mg  daily and Losartan 50mg  daily   6.  Subclavian artery stenosis  - she has a history of left subclavian steal and is s/p  stenting by Dr. Judi Cong with repeat angioplasty on 01/05/2016 down to 80-85% patency.  -Dopplers 08/2019 with 60-79% left and 1-39% right carotid stenosis and retrograde flow in left vertebral artery -continue ASA, Plavix and statin -followed  by Dr. Judi Cong  7.  Hyperlipidemia   LDL goal is < 70.   -LDL was 49 in Jan 2022 -continue Crestor 20mg  daily for now  Medication Adjustments/Labs and Tests Ordered: Current medicines are reviewed at length with the patient today.  Concerns regarding medicines are outlined above.  Orders Placed This Encounter  Procedures  . EKG 12-Lead   No orders of the defined types were placed in this encounter.   Signed, Fransico Him, MD  06/01/2020 4:34 PM    [place]

## 2020-06-01 NOTE — Patient Instructions (Signed)

## 2020-06-04 ENCOUNTER — Other Ambulatory Visit: Payer: Self-pay | Admitting: Internal Medicine

## 2020-06-04 DIAGNOSIS — M25641 Stiffness of right hand, not elsewhere classified: Secondary | ICD-10-CM | POA: Diagnosis not present

## 2020-06-16 DIAGNOSIS — M25641 Stiffness of right hand, not elsewhere classified: Secondary | ICD-10-CM | POA: Diagnosis not present

## 2020-06-21 ENCOUNTER — Other Ambulatory Visit (HOSPITAL_COMMUNITY): Payer: Self-pay | Admitting: Interventional Radiology

## 2020-06-21 DIAGNOSIS — I771 Stricture of artery: Secondary | ICD-10-CM

## 2020-06-24 DIAGNOSIS — H40013 Open angle with borderline findings, low risk, bilateral: Secondary | ICD-10-CM | POA: Diagnosis not present

## 2020-06-24 DIAGNOSIS — H26493 Other secondary cataract, bilateral: Secondary | ICD-10-CM | POA: Diagnosis not present

## 2020-06-24 DIAGNOSIS — H26492 Other secondary cataract, left eye: Secondary | ICD-10-CM | POA: Diagnosis not present

## 2020-06-24 DIAGNOSIS — H353131 Nonexudative age-related macular degeneration, bilateral, early dry stage: Secondary | ICD-10-CM | POA: Diagnosis not present

## 2020-06-24 DIAGNOSIS — Z961 Presence of intraocular lens: Secondary | ICD-10-CM | POA: Diagnosis not present

## 2020-06-28 DIAGNOSIS — M79641 Pain in right hand: Secondary | ICD-10-CM | POA: Diagnosis not present

## 2020-06-28 DIAGNOSIS — Z4789 Encounter for other orthopedic aftercare: Secondary | ICD-10-CM | POA: Diagnosis not present

## 2020-06-28 DIAGNOSIS — M1811 Unilateral primary osteoarthritis of first carpometacarpal joint, right hand: Secondary | ICD-10-CM | POA: Diagnosis not present

## 2020-06-28 DIAGNOSIS — G5603 Carpal tunnel syndrome, bilateral upper limbs: Secondary | ICD-10-CM | POA: Diagnosis not present

## 2020-07-01 ENCOUNTER — Ambulatory Visit (HOSPITAL_COMMUNITY)
Admission: RE | Admit: 2020-07-01 | Discharge: 2020-07-01 | Disposition: A | Discharge status: Home / Self Care | Payer: PPO | Source: Ambulatory Visit | Attending: Interventional Radiology | Admitting: Interventional Radiology

## 2020-07-01 ENCOUNTER — Other Ambulatory Visit: Payer: Self-pay

## 2020-07-01 DIAGNOSIS — I771 Stricture of artery: Secondary | ICD-10-CM | POA: Diagnosis not present

## 2020-07-01 NOTE — Progress Notes (Signed)
Carotid duplex has been completed.   Preliminary results in CV Proc.   Abram Sander 07/01/2020 1:24 PM

## 2020-07-02 ENCOUNTER — Telehealth: Payer: Self-pay | Admitting: Internal Medicine

## 2020-07-02 NOTE — Progress Notes (Signed)
  Chronic Care Management   Outreach Note  07/02/2020 Name: Susan Gillespie MRN: [id number] DOB: 30-May-1945  Referred by: Binnie Rail, MD Reason for referral : No chief complaint on file.   An unsuccessful telephone outreach was attempted today. The patient was referred to the pharmacist for assistance with care management and care coordination.   Follow Up Plan:   Carley Perdue [employer] Scheduler

## 2020-07-02 NOTE — Progress Notes (Signed)
  Chronic Care Management   Note  07/02/2020 Name: SHARISA TOVES MRN: [id number] DOB: [birth year]  Jodi Mourning Anspach is a 75 y.o. year old female who is a primary care patient of Burns, Claudina Lick, MD. I reached out to Jodi Mourning Blakeney by phone today in response to a referral sent by Ms. Jodi Mourning Castell's PCP, Binnie Rail, MD.   Ms. Stanly was given information about Chronic Care Management services today including:  1. CCM service includes personalized support from designated clinical staff supervised by her physician, including individualized plan of care and coordination with other care providers 2. 24/7 contact phone numbers for assistance for urgent and routine care needs. 3. Service will only be billed when office clinical staff spend 20 minutes or more in a month to coordinate care. 4. Only one practitioner may furnish and bill the service in a calendar month. 5. The patient may stop CCM services at any time (effective at the end of the month) by phone call to the office staff.   Patient agreed to services and verbal consent obtained.   Follow up plan:   Carley Perdue [employer] Scheduler

## 2020-07-05 ENCOUNTER — Other Ambulatory Visit: Payer: Self-pay | Admitting: Internal Medicine

## 2020-07-06 ENCOUNTER — Telehealth (HOSPITAL_COMMUNITY): Payer: Self-pay

## 2020-07-06 NOTE — Telephone Encounter (Signed)
Pt agreed to f/u in 6 months with us carotid. AW 

## 2020-07-08 DIAGNOSIS — H02002 Unspecified entropion of right lower eyelid: Secondary | ICD-10-CM | POA: Diagnosis not present

## 2020-07-08 DIAGNOSIS — H26491 Other secondary cataract, right eye: Secondary | ICD-10-CM | POA: Diagnosis not present

## 2020-07-11 ENCOUNTER — Other Ambulatory Visit: Payer: Self-pay | Admitting: Cardiology

## 2020-07-12 DIAGNOSIS — H02002 Unspecified entropion of right lower eyelid: Secondary | ICD-10-CM | POA: Diagnosis not present

## 2020-07-12 DIAGNOSIS — H26491 Other secondary cataract, right eye: Secondary | ICD-10-CM | POA: Diagnosis not present

## 2020-07-12 MED ORDER — DILTIAZEM HCL ER COATED BEADS 180 MG PO CP24: 180.0000 mg | ORAL_CAPSULE | Freq: Every day | ORAL | 3 refills | Status: DC

## 2020-07-14 IMAGING — XA BILATERAL COMMON CAROTID AND INNOMINATE ANGIOGRAPHY
2 of 4 series · 11 of 24 positions shown · IV contrast (IODINE)
Comparison: CT angiogram of the head and neck of 09/30/2018.

CLINICAL DATA: Past history of vertebrobasilar insufficiency due to
a left subclavian steal. Abnormal recent CT angiogram of the head
and neck.

EXAM:
BILATERAL COMMON CAROTID AND INNOMINATE ANGIOGRAPHY
TECHNIQUE: Informed written consent was obtained from the patient after a
thorough discussion of the procedural risks, benefits and
alternatives. All questions were addressed. Maximal Sterile Barrier
Technique was utilized including caps, mask, sterile gowns, sterile
gloves, sterile drape, hand hygiene and skin antiseptic. A timeout
was performed prior to the initiation of the procedure.

[Series 12: cerebral · 1 of 1 slices shown]
[im 1/1]
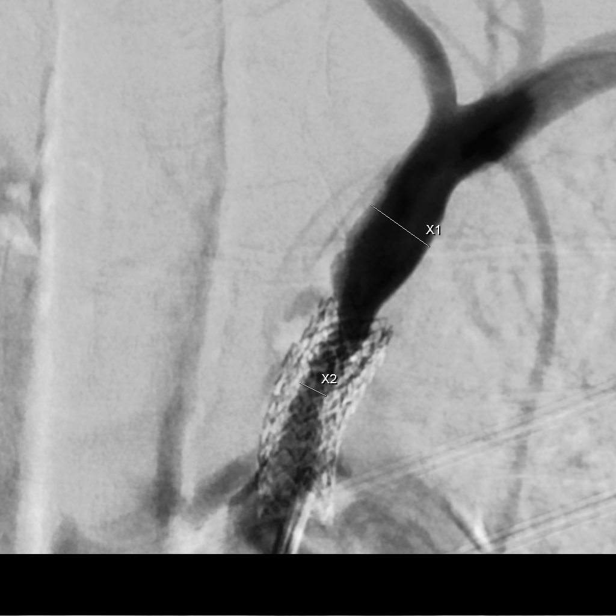

[Series 300: dr. (person_name) · 10 of 255 slices shown]
[im 1/255]
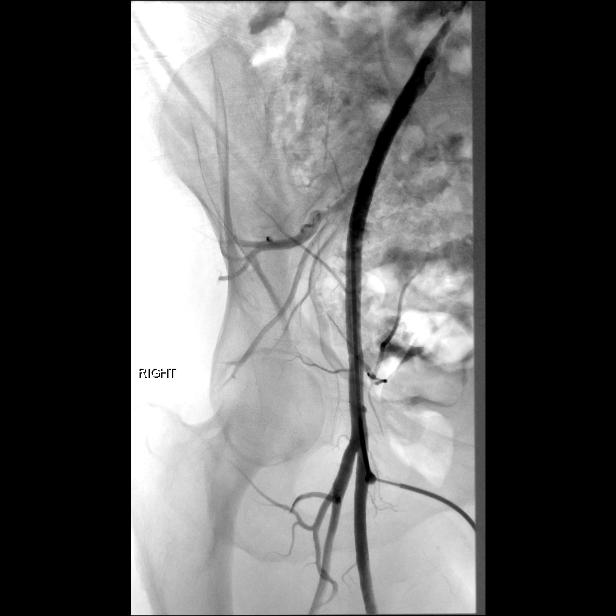
[im 26/255]
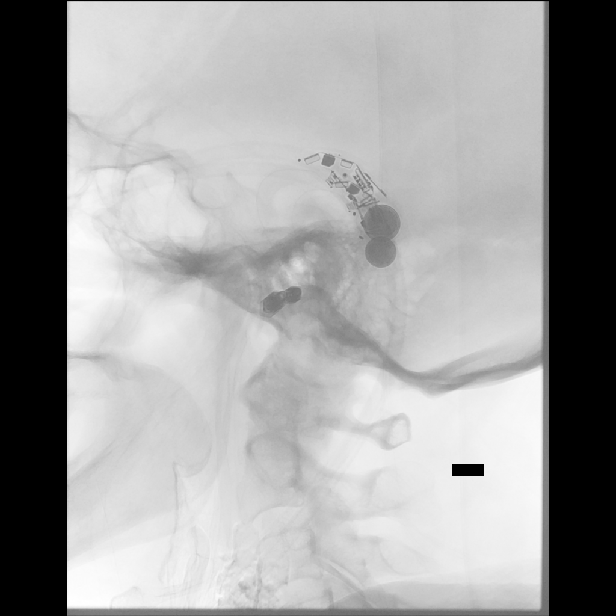
[im 51/255]
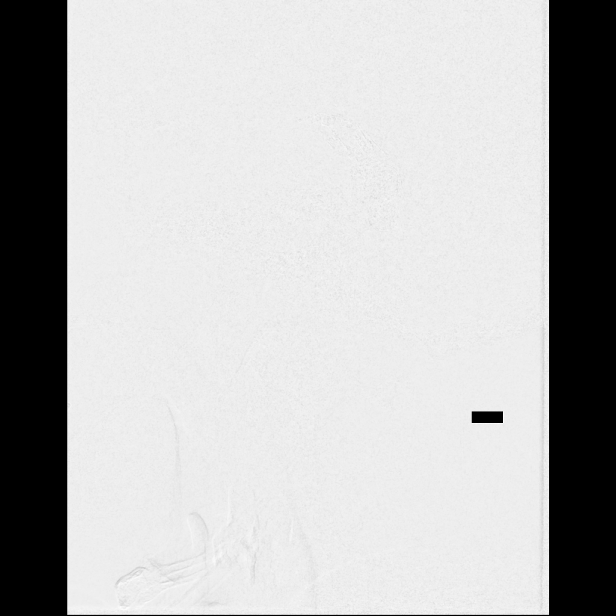
[im 77/255]
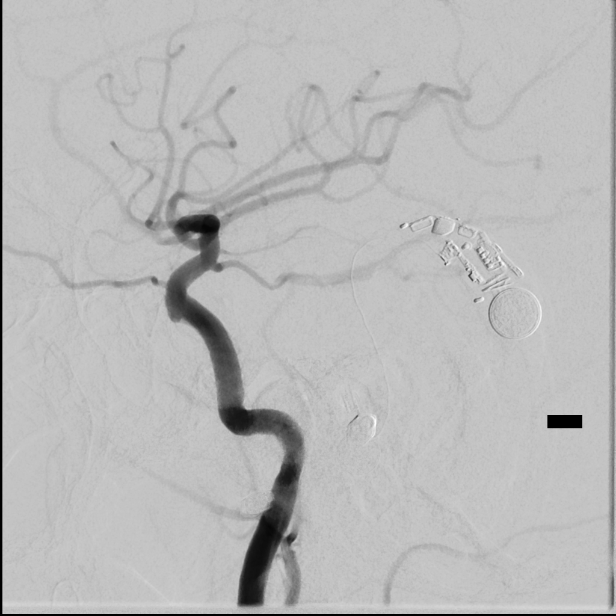
[im 115/255]
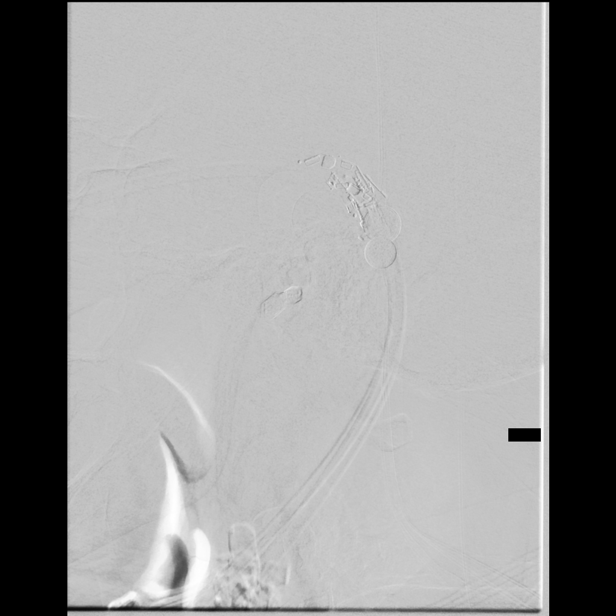
[im 140/255]
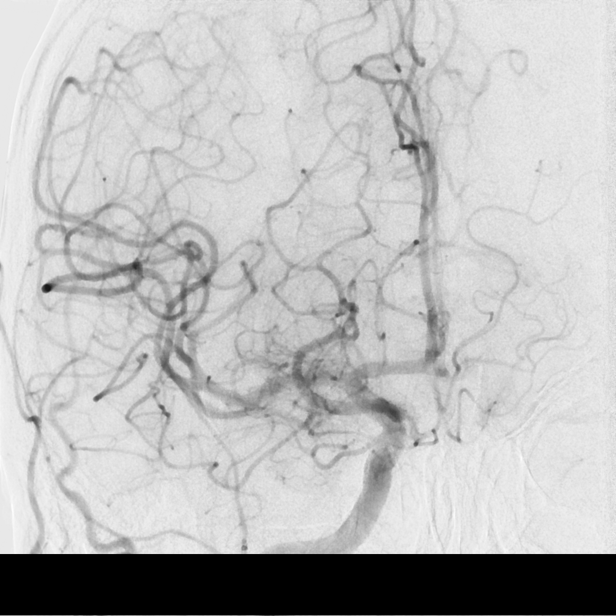
[im 166/255]
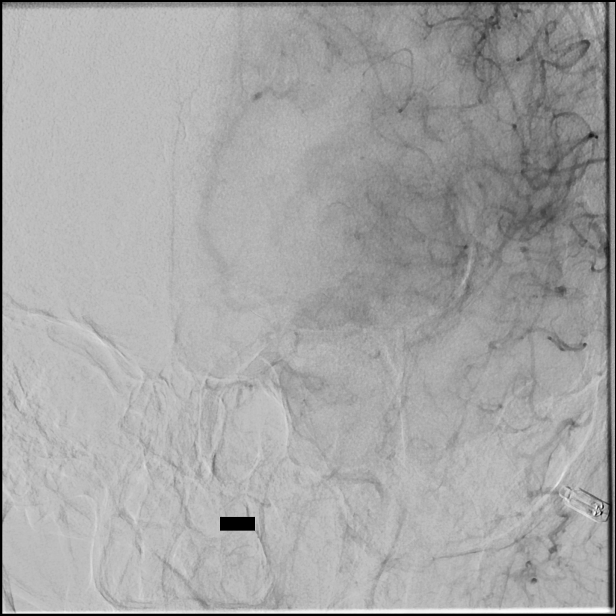
[im 191/255]
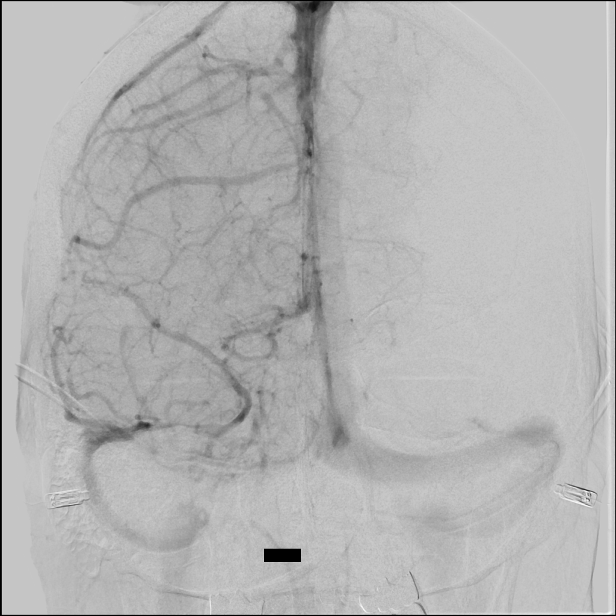
[im 216/255]
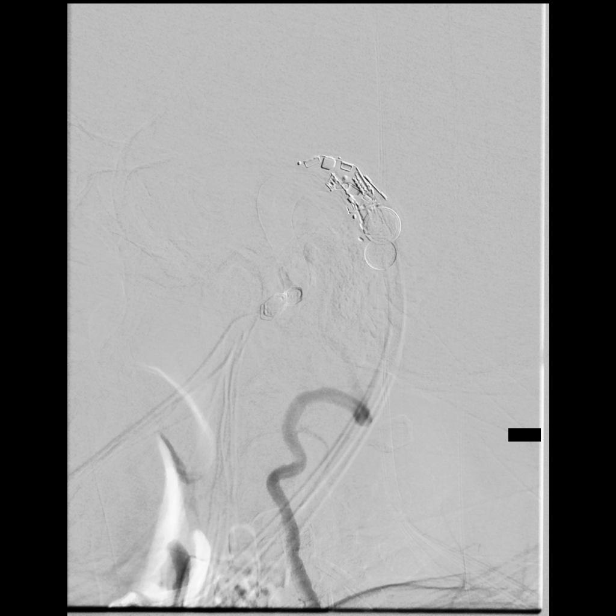
[im 242/255]
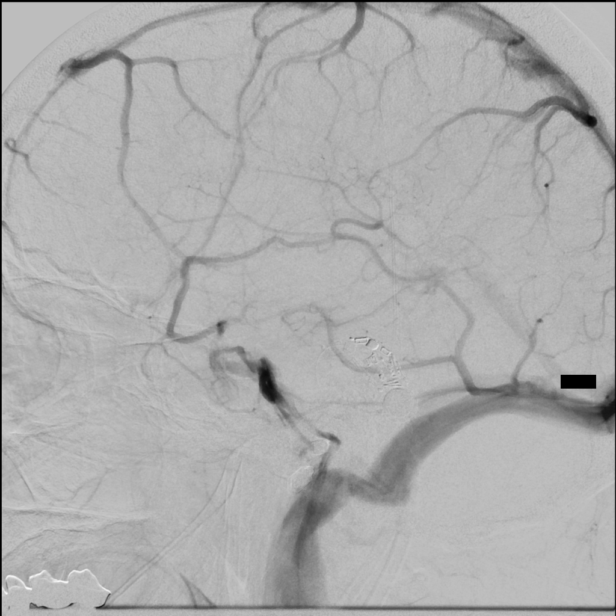

[11 of 24 positions shown; findings below may reference images not displayed]

MEDICATIONS:
Heparin 8333 units IV; no antibiotic was administered within 1 hour
of the procedure.

ANESTHESIA/SEDATION:
Mac anesthesia as per [REDACTED] at [REDACTED].

CONTRAST:  Isovue 300 approximately 60 mL.

FLUOROSCOPY TIME:  Fluoroscopy Time: 6 minutes 18 seconds (719 mGy).

COMPLICATIONS:
None immediate.
The right groin was prepped and draped in the usual sterile fashion.
Thereafter using modified Seldinger technique, transfemoral access
into the right common femoral artery was obtained without
difficulty. Over a 0.035 inch guidewire, a 5 French Pinnacle sheath
was inserted. Through this, and also over 0.035 inch guidewire, a 5
French JB 1 catheter was advanced to the aortic arch region and
selectively positioned in the right common carotid artery, the right
vertebral artery, the left common carotid artery and the left
subclavian artery.
FINDINGS: The right common carotid arteriogram demonstrates mild stenosis of
the right common carotid artery just proximal to the right common
carotid bifurcation. The right external carotid artery also
demonstrates mild stenosis at its origin. Its branches, however,
opacify normally.

The right internal carotid artery at the bulb has a smooth shallow
plaque along medial posterior wall resulting in a mild stenosis, and
without evidence of ulcerations or of intraluminal filling defects.

The junction of the proximal [DATE], and the middle [DATE] of the right
internal carotid artery demonstrate moderate tortuosity without
evidence of kinking.

Distal to this the right internal carotid artery is seen to opacify
to the cranial skull base. The petrous, cavernous and the
supraclinoid segments are widely patent.

A right posterior communicating artery is seen opacifying the right
posterior cerebral artery distribution, with transient retrograde
opacification of the distal basilar artery to the level of the
superior cerebellar arteries. The right middle cerebral artery and
the right anterior cerebral artery opacify into the capillary and
venous phases. Prompt cross filling via the anterior communicating
artery of the left anterior cerebral A2 segment and distally is
seen.

The origin of the right vertebral artery is widely patent. The
vessel opacifies to the cranial skull base unimpeded. Wide patency
is seen of the right vertebrobasilar junction and the right
posterior-inferior cerebellar artery.

The basilar artery, the left posterior cerebral artery, the superior
cerebellar arteries and the anterior-inferior cerebellar arteries
opacify normally into the capillary and venous phases. There is
transient filling of the right posterior cerebral artery P1 segment
with contrast washout from the anterior circulation via the
posterior communicating artery as described above. Also demonstrated
is retrograde opacification into the left vertebrobasilar junction
to the level of C1-C2 with slow antegrade flow clearance.

The left common carotid arteriogram demonstrates approximately 80%
stenosis of the proximal left external carotid artery. Its branches
opacify normally.

The left internal carotid artery at the bulb has approximately
50-60% stenosis at its maximum associated with a segmental
atherosclerotic plaque along the posterior wall. More distally the
left internal carotid artery is seen to opacify to the cranial skull
base. The petrous, the cavernous and the supraclinoid segments are
widely patent.

The left middle cerebral artery just proximal to the trifurcation
branches has a mild stenosis probably related to intracranial
arteriosclerosis. The trifurcation branches opacify into the
capillary and venous phases.

Left anterior cerebral artery opacifies into the capillary and
venous phases.

The left subclavian arteriogram demonstrates approximately 60% intra
stent segmental stenosis with antegrade flow noted into the distal
left vertebral artery without evidence of contrast stagnant.
IMPRESSION: Approximately 50-60% stenosis of the left internal carotid artery at
the bulb.

Approximately 60% intra stent stenosis of the proximal left
subclavian artery.

Approximately 80% stenosis of the proximal left external carotid
artery.

PLAN:
Angiographic findings were reviewed with the patient. The patient's
right arm pressure with the cuff was measured at 152/66 with the
left arm pressure being 134/64 at the same time.

There is a proximal gradient of approximately 18 mmHg.

Given the patient's clinical history of vertigo when turning her
head to the right, without associated symptoms of visual blurring,
diplopia, perioral numbness, or gait instability, and the
angiographic findings, it was decided to continue with conservative
management at this time.

Close follow-up with ultrasound of the carotids and left subclavian
artery will be obtained in approximately 3 months time. Patient was
advised that should she developed symptoms described above or
worsening of her vertigo, to call 911. Patient expressed
understanding and agreement with the above management plan.

## 2020-07-19 DIAGNOSIS — E261 Secondary hyperaldosteronism: Secondary | ICD-10-CM | POA: Diagnosis not present

## 2020-07-19 DIAGNOSIS — I509 Heart failure, unspecified: Secondary | ICD-10-CM | POA: Diagnosis not present

## 2020-07-19 DIAGNOSIS — I110 Hypertensive heart disease with heart failure: Secondary | ICD-10-CM | POA: Diagnosis not present

## 2020-08-09 ENCOUNTER — Other Ambulatory Visit: Payer: Self-pay | Admitting: Cardiology

## 2020-08-10 ENCOUNTER — Telehealth: Payer: Self-pay

## 2020-08-10 NOTE — Progress Notes (Signed)
Chronic Care Management Pharmacy Assistant   Name: Susan Gillespie  MRN: [id number] DOB: 1945-08-12   Reason for Encounter: Initial Questions Appointment: OV 08/11/20 9 am    Recent office visits:  05/13/20 Burns (PCP) - F/u Hypertension. Increase trazodone to 100 mg nightly. Follow up in 6 months.  Recent consult visits:       06/16/20 Janalyn Harder Med) - Stiffness of right hand   06/04/20 Roger Shelter (Occu Med) - Stiffness of right hand   06/01/20 Turner (Cardiology) - Seen for Coronary Artery Disease. Hypertension. Hyperlipidemia. Congestive Heart Failure. F/u in one year.  05/27/20 Gramig (Hand Surgeon) - Pain in right hand.  05/27/20 Bartels (Occu Med) - Stiffness of right hand.  05/17/20 Wood (Optometry) - Foreign body in cornea, right eye.  Hospital visits:  None in previous 6 months  Medications: Outpatient Encounter Medications as of 08/10/2020  Medication Sig  . acetaminophen (TYLENOL) 650 MG CR tablet Take 1,300 mg by mouth every 8 (eight) hours as needed for pain.   Marland Kitchen ALPRAZolam (XANAX) 0.5 MG tablet TAKE 0.5 TABLETS (0.25 MG TOTAL) BY MOUTH AT BEDTIME AS NEEDED FOR ANXIETY OR SLEEP.  Marland Kitchen aspirin 325 MG tablet Take 1 tablet (325 mg total) by mouth daily.  . celecoxib (CELEBREX) 200 MG capsule Take 200 mg by mouth daily as needed for mild pain.  . cetirizine (ZYRTEC) 10 MG tablet Take 10 mg by mouth every morning.  . clopidogrel (PLAVIX) 75 MG tablet TAKE 1 TABLET BY MOUTH EVERY DAY  . diltiazem (CARDIZEM CD) 180 MG 24 hr capsule Take 1 capsule (180 mg total) by mouth at bedtime.  . famotidine (PEPCID) 40 MG tablet Take 1 tablet (40 mg total) by mouth daily.  . ferrous sulfate 325 (65 FE) MG tablet Take 325 mg by mouth daily with breakfast.  . furosemide (LASIX) 40 MG tablet Take 1/2 tablet by mouth daily. May take additional 1/2 tablet as needed for Edema.  Marland Kitchen loperamide (IMODIUM A-D) 2 MG tablet Take 2 mg by mouth every other day.   . losartan (COZAAR) 50 MG tablet TAKE 1  TABLET BY MOUTH EVERY DAY  . Magnesium 250 MG TABS Take 250 mg by mouth daily.  . methocarbamol (ROBAXIN) 500 MG tablet Take by mouth.  . nitroGLYCERIN (NITROSTAT) 0.4 MG SL tablet Place 1 tablet (0.4 mg total) under the tongue every 5 (five) minutes as needed for chest pain.  . potassium chloride (KLOR-CON) 10 MEQ tablet TAKE 1 TABLET BY MOUTH EVERY DAY  . PREMARIN vaginal cream 1 APPLICATOR FULL INTO VAGINA TWICE A WEEK  . rosuvastatin (CRESTOR) 20 MG tablet TAKE 1 TABLET BY MOUTH EVERYDAY AT BEDTIME  . SUMAtriptan (IMITREX) 50 MG tablet Take 50 mg by mouth every 2 (two) hours as needed for migraine. May repeat in 2 hours if headache persists or recurs.  . traZODone (DESYREL) 100 MG tablet Take 1 tablet (100 mg total) by mouth at bedtime.  . vitamin B-12 (CYANOCOBALAMIN) 1000 MCG tablet Take 1,000 mcg by mouth daily.   No facility-administered encounter medications on file as of 08/10/2020.    Have you seen any other providers since your last visit?  Patient states she seen her Orthopedic and  eye doctor recently.  Any changes in your medications or health?  Patient states no changes in medications.  Any side effects from any medications?  Patient states no side effects at this time.  Do you have any symptoms or problems not managed by your medications?  Patient states no problems at this time.  Any concerns about your health right now?  Patient states she has been aching due to working in the yard.  Has your provider asked that you check blood pressure, blood sugar, or follow special diet at home?  Patient states she doesn't check her BP, a nurse from [place] comes every 6 months and does it and it runs in normal range. She also follows a low sodium diet.  Do you get any type of exercise on a regular basis?  Patient states she doesn't exercise but is very active.  Can you think of a goal you would like to reach for your health?  Patient states she would like to be less achy  when doing certain tasks.  Do you have any problems getting your medications?  Patient states no problems at this time.  Is there anything that you would like to discuss during the appointment?       Patient states not at this time.  Please bring medications and supplements to appointment   Star Rating Drugs: Losartan - last fill 02/27/20 90D  Rosuvastatin - last fill 06/04/20 90D  Patient stated she has plenty of Losartan on hand due to having dose change. She was given 25 mg and told to take 2 until 50 mg came back in stock and now she takes 50 mg again.  Orinda Kenner, [facility] Pharmacists Assistant [phone]  Time Spent: [phone]

## 2020-08-11 ENCOUNTER — Telehealth: Payer: Self-pay | Admitting: Pharmacist

## 2020-08-11 ENCOUNTER — Ambulatory Visit (INDEPENDENT_AMBULATORY_CARE_PROVIDER_SITE_OTHER): Payer: PPO | Admitting: Pharmacist

## 2020-08-11 ENCOUNTER — Other Ambulatory Visit: Payer: Self-pay

## 2020-08-11 DIAGNOSIS — I2510 Atherosclerotic heart disease of native coronary artery without angina pectoris: Secondary | ICD-10-CM

## 2020-08-11 DIAGNOSIS — I10 Essential (primary) hypertension: Secondary | ICD-10-CM

## 2020-08-11 DIAGNOSIS — I5032 Chronic diastolic (congestive) heart failure: Secondary | ICD-10-CM | POA: Diagnosis not present

## 2020-08-11 DIAGNOSIS — M8949 Other hypertrophic osteoarthropathy, multiple sites: Secondary | ICD-10-CM

## 2020-08-11 DIAGNOSIS — N3289 Other specified disorders of bladder: Secondary | ICD-10-CM

## 2020-08-11 DIAGNOSIS — G4709 Other insomnia: Secondary | ICD-10-CM

## 2020-08-11 DIAGNOSIS — E785 Hyperlipidemia, unspecified: Secondary | ICD-10-CM

## 2020-08-11 DIAGNOSIS — K219 Gastro-esophageal reflux disease without esophagitis: Secondary | ICD-10-CM

## 2020-08-11 DIAGNOSIS — M159 Polyosteoarthritis, unspecified: Secondary | ICD-10-CM

## 2020-08-11 NOTE — Progress Notes (Signed)
Chronic Care Management Pharmacy Note  08/11/2020 Name:  Susan Gillespie MRN:  [id number] DOB:  09-21-1945  Subjective: Susan Gillespie is an 75 y.o. year old female who is a primary patient of Burns, Claudina Lick, MD.  The CCM team was consulted for assistance with disease management and care coordination needs.    Engaged with patient face to face for initial visit in response to provider referral for pharmacy case management and/or care coordination services.   Consent to Services:  The patient was given the following information about Chronic Care Management services today, agreed to services, and gave verbal consent: 1. CCM service includes personalized support from designated clinical staff supervised by the primary care provider, including individualized plan of care and coordination with other care providers 2. 24/7 contact phone numbers for assistance for urgent and routine care needs. 3. Service will only be billed when office clinical staff spend 20 minutes or more in a month to coordinate care. 4. Only one practitioner may furnish and bill the service in a calendar month. 5.The patient may stop CCM services at any time (effective at the end of the month) by phone call to the office staff. 6. The patient will be responsible for cost sharing (co-pay) of up to 20% of the service fee (after annual deductible is met). Patient agreed to services and consent obtained.  Patient Care Team: Binnie Rail, MD as PCP - General (Internal Medicine) Sueanne Margarita, MD as PCP - Cardiology (Cardiology) Charlton Haws, [facility] as Pharmacist (Pharmacist)  Patient is from [place]. She lives alone in a condo. 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 She has 2 children living locally and 2 teenage grandchildren. She enjoys gardening and travelling with her boyfriend, they go to [place] a couple times a year.  Recent office visits: 05/13/20 Burns (PCP)  - F/u Hypertension. Increase trazodone to 100 mg nightly. Follow up in 6 months. B12 level high, may reduce B12 to TIW.   Recent consult visits: PT/OT for right hand pain/stiffness 06/01/20 Turner (Cardiology) - Seen for Coronary Artery Disease. Hypertension. Hyperlipidemia. Congestive Heart Failure. F/u in one year. 05/17/20 Wood (Optometry) - Foreign body in cornea, right eye.  Hospital visits: None in previous 6 months  Objective:  Lab Results  Component Value Date   CREATININE 0.99 05/13/2020   BUN 16 05/13/2020   GFR 55.55 (L) 05/13/2020   GFRNONAA 55 (L) 10/16/2018   GFRAA >60 10/16/2018   NA 139 05/13/2020   K 4.2 05/13/2020   CALCIUM 9.9 05/13/2020   CO2 26 05/13/2020   GLUCOSE 108 (H) 05/13/2020    Lab Results  Component Value Date/Time   HGBA1C 5.9 05/13/2020 11:08 AM   HGBA1C 5.7 (H) 11/11/2019 08:56 AM   GFR 55.55 (L) 05/13/2020 11:08 AM   GFR 47.91 (L) 05/02/2019 10:39 AM    Last diabetic Eye exam:  Lab Results  Component Value Date/Time   HMDIABEYEEXA normal by her report 01/03/2012 12:00 AM    Last diabetic Foot exam:  Lab Results  Component Value Date/Time   HMDIABFOOTEX done 09/19/2012 12:00 AM     Lab Results  Component Value Date   CHOL 163 05/13/2020   HDL 78.80 05/13/2020   LDLCALC 49 05/13/2020   LDLDIRECT 50.0 10/30/2018   TRIG 176.0 (H) 05/13/2020   CHOLHDL 2 05/13/2020    Hepatic Function Latest Ref Rng & Units 05/13/2020 11/11/2019 05/29/2019  Total Protein  6.0 - 8.3 g/dL 6.8 6.4 -  Albumin 3.5 - 5.2 g/dL 4.4 - -  AST 0 - 37 U/L 18 15 -  ALT 0 - 35 U/L _0 Alk Phosphatase 39 - 117 U/L 62 - -  Total Bilirubin 0.2 - 1.2 mg/dL 0.4 0.4 -  Bilirubin, Direct 0.00 - 0.40 mg/dL - - -    Lab Results  Component Value Date/Time   TSH 1.28 05/02/2019 10:39 AM   TSH 1.52 04/05/2018 10:51 AM   FREET4 0.82 02/05/2012 02:44 PM    CBC Latest Ref Rng & Units 05/13/2020 11/11/2019 05/02/2019  WBC 4.0 - 10.5 K/uL 4.5 5.4 5.3  Hemoglobin  12.0 - 15.0 g/dL 12.1 11.8 12.7  Hematocrit 36.0 - 46.0 % 36.3 35.0 38.1  Platelets 150.0 - 400.0 K/uL 263.0 262 278.0    No results found for: VD25OH  Clinical ASCVD: Yes  The 10-year ASCVD risk score Mikey Bussing DC Jr., et al., 2013) is: 24.8%   Values used to calculate the score:     Age: 18 years     Sex: Female     Is Non-Hispanic African American: No     Diabetic: No     Tobacco smoker: No     Systolic Blood Pressure: 124 mmHg     Is BP treated: Yes     HDL Cholesterol: 78.8 mg/dL     Total Cholesterol: 163 mg/dL    Depression screen [facility] 2/9 05/13/2020 11/11/2019 07/29/2019  Decreased Interest 0 0 0  Down, Depressed, Hopeless 0 0 0  PHQ - 2 Score 0 0 0  Altered sleeping 0 0 -  Tired, decreased energy 0 0 -  Change in appetite 0 0 -  Feeling bad or failure about yourself  0 0 -  Trouble concentrating 0 0 -  Moving slowly or fidgety/restless 0 0 -  Suicidal thoughts 0 0 -  PHQ-9 Score 0 0 -  Difficult doing work/chores Not difficult at all - -  Some recent data might be hidden    No flowsheet data found.  Social History   Tobacco Use  Smoking Status Former Smoker  . Packs/day: 2.00  . Years: 22.00  . Pack years: 44.00  . Types: Cigarettes  . Quit date: 04/23/1985  . Years since quitting: 35.3  Smokeless Tobacco Never Used   BP Readings from Last 3 Encounters:  06/01/20 (!) 132/54  05/13/20 128/62  11/11/19 (!) 138/60   Pulse Readings from Last 3 Encounters:  06/01/20 66  05/13/20 60  11/11/19 60   Wt Readings from Last 3 Encounters:  06/01/20 153 lb 12.8 oz (69.8 kg)  05/13/20 156 lb 9.6 oz (71 kg)  11/11/19 157 lb (71.2 kg)   BMI Readings from Last 3 Encounters:  06/01/20 27.03 kg/m  05/13/20 27.74 kg/m  11/11/19 27.81 kg/m    Assessment/Interventions: Review of patient past medical history, allergies, medications, health status, including review of consultants reports, laboratory and other test data, was performed as part of comprehensive evaluation  and provision of chronic care management services.   SDOH:  (Social Determinants of Health) assessments and interventions performed: Yes SDOH Interventions   Flowsheet Row Most Recent Value  SDOH Interventions   Financial Strain Interventions Intervention Not Indicated     SDOH Screenings   Alcohol Screen: Not on file  Depression (PHQ2-9): Low Risk   . PHQ-2 Score: 0  Financial Resource Strain: Low Risk   . Difficulty of Paying Living Expenses: Not hard  at all  Food Insecurity: Not on file  Housing: Not on file  Physical Activity: Not on file  Social Connections: Not on file  Stress: Not on file  Tobacco Use: Medium Risk  . Smoking Tobacco Use: Former Smoker  . Smokeless Tobacco Use: Never Used  Transportation Needs: Not on file    CCM Care Plan  Allergies  Allergen Reactions  . Phenergan [Promethazine Hcl] Other (See Comments)    IV suppressed breathing   . Daypro [Oxaprozin] Swelling    facial  . Lisinopril     cough  . Oxycodone Itching  . Simvastatin     REACTION: myalgias and fatigue  . Tramadol     Medications Reviewed Today    Reviewed by Charlton Haws, [facility] (Pharmacist) on 08/11/20 at 1046  Med List Status: <None>  Medication Order Taking? Sig Documenting Provider Last Dose Status Informant  acetaminophen (TYLENOL) 650 MG CR tablet [id number] Yes Take 1,300 mg by mouth every 8 (eight) hours as needed for pain.  [provider] Taking Active Self  ALPRAZolam Duanne Moron) 0.5 MG tablet [id number] Yes TAKE 0.5 TABLETS (0.25 MG TOTAL) BY MOUTH AT BEDTIME AS NEEDED FOR ANXIETY OR SLEEP. Binnie Rail, MD Taking Active   aspirin 325 MG tablet [id number] Yes Take 1 tablet (325 mg total) by mouth daily. Hedy Jacob, PA-C Taking Active Self           Med Note [facility], GREG A   Tue May 09, 2016  7:19 PM)    cetirizine (ZYRTEC) 10 MG tablet [id number] Yes Take 10 mg by mouth every morning. [provider] Taking Active Self  clopidogrel (PLAVIX) 75  MG tablet [id number] Yes TAKE 1 TABLET BY MOUTH EVERY DAY Burns, Claudina Lick, MD Taking Active   diltiazem (CARDIZEM CD) 180 MG 24 hr capsule [id number] Yes Take 1 capsule (180 mg total) by mouth at bedtime. Sueanne Margarita, MD Taking Active   famotidine (PEPCID) 40 MG tablet [id number] Yes Take 1 tablet (40 mg total) by mouth daily. Binnie Rail, MD Taking Active   ferrous sulfate 325 (65 FE) MG tablet [id number] Yes Take 325 mg by mouth daily with breakfast. [provider] Taking Active Self  fluticasone (FLONASE) 50 MCG/ACT nasal spray [id number] Yes Place into both nostrils daily. [provider] Taking Active   furosemide (LASIX) 40 MG tablet [id number] Yes Take 1/2 tablet by mouth daily. May take additional 1/2 tablet as needed for Edema. Sueanne Margarita, MD Taking Active   loperamide (IMODIUM A-D) 2 MG tablet [id number] Yes Take 2 mg by mouth every other day.  [provider] Taking Active Self  losartan (COZAAR) 50 MG tablet [id number] Yes TAKE 1 TABLET BY MOUTH EVERY DAY Burns, Claudina Lick, MD Taking Active   methocarbamol (ROBAXIN) 500 MG tablet [id number] Yes Take by mouth. [provider] Taking Active   nitroGLYCERIN (NITROSTAT) 0.4 MG SL tablet [id number]  Place 1 tablet (0.4 mg total) under the tongue every 5 (five) minutes as needed for chest pain. Eileen Stanford, PA-C  Expired 10/10/18 2359 Self  potassium chloride (KLOR-CON) 10 MEQ tablet [id number] Yes TAKE 1 TABLET BY MOUTH EVERY DAY Binnie Rail, MD Taking Active   PREMARIN vaginal cream [id number] Yes 1 APPLICATOR FULL INTO VAGINA TWICE A WEEK Burns, Claudina Lick, MD Taking Active   rosuvastatin (CRESTOR) 20 MG tablet [id number] Yes TAKE 1 TABLET BY MOUTH EVERYDAY AT BEDTIME Binnie Rail, MD Taking Active   SUMAtriptan (  IMITREX) 50 MG tablet [id number] Yes Take 50 mg by mouth every 2 (two) hours as needed for migraine. May repeat in 2 hours if headache persists or recurs. [provider] Taking Active  Self  traZODone (DESYREL) 100 MG tablet [id number] Yes Take 1 tablet (100 mg total) by mouth at bedtime. Binnie Rail, MD Taking Active   vitamin B-12 (CYANOCOBALAMIN) 500 MCG tablet [id number] Yes Take 500 mcg by mouth daily. [provider] Taking Active Self          Patient Active Problem List   Diagnosis Date Noted  . Lower back pain 11/11/2019  . Bladder spasms 10/09/2017  . Migraine without status migrainosus, not intractable 10/09/2017  . Spondylosis of cervical region without myelopathy or radiculopathy 10/09/2017  . Insomnia 10/09/2017  . IBS (irritable bowel syndrome) 10/09/2017  . Chronic diastolic CHF (congestive heart failure), NYHA class 1 ([place]) 02/20/2017  . CAD in native artery 11/10/2016  . Carotid stenosis, bilateral 08/31/2016  . 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[place]) 01/05/2016  . Pernicious anemia 09/15/2013  . B12 deficiency anemia 09/15/2013  . Situational anxiety 12/20/2012  . Spinal stenosis of lumbar region 07/05/2012  . Cerebral infarction ([place]) 06/28/2012  . Bleeding tendency ([place]) 05/16/2012  . Paroxysmal supraventricular tachycardia ([place]) 09/25/2011  . Essential hypertension, benign 08/22/2010  . Prediabetes 04/28/2009  . Hyperlipidemia LDL goal <70 12/16/2008  . GERD 12/16/2008  . Osteoarthritis 12/16/2008    Immunization History  Administered Date(s) Administered  . Influenza Whole 02/14/2012  . Influenza, High Dose Seasonal PF 01/19/2017, 02/08/2018, 01/28/2020  . Influenza, Quadrivalent, Recombinant, Inj, Pf 01/18/2019  . Influenza,inj,Quad PF,6+ Mos 01/06/2016  . Influenza,inj,quad, With Preservative 01/12/2014  . Influenza-Unspecified 01/15/2013, 02/03/2015, 01/19/2017  . PFIZER Comirnaty(Gray Top)Covid-19 Tri-Sucrose Vaccine 07/31/2020  . PFIZER(Purple Top)SARS-COV-2 Vaccination 05/02/2019, 05/23/2019, 02/21/2020  . Pneumococcal Conjugate-13 04/28/2009, 01/12/2014  . Pneumococcal Polysaccharide-23 11/06/2018  . Td 04/18/2007  .  Zoster 04/28/2009  . Zoster Recombinat (Shingrix) 01/18/2019, 04/18/2019    Conditions to be addressed/monitored:  Hypertension, Hyperlipidemia, Heart Failure, Coronary Artery Disease, GERD and Osteoarthritis, hx stroke, migraine  Care Plan : [place]  Updates made by Charlton Haws, [place] since 08/11/2020 12:00 AM    Problem: Hypertension, Hyperlipidemia, Heart Failure, Coronary Artery Disease, GERD, Osteoarthritis and Overactive Bladder, hx stroke, migraine   Priority: High    Long-Range Goal: Disease management   Start Date: 08/11/2020  Expected End Date: 08/11/2021  This Visit's Progress: On track  Priority: High  Note:   Current Barriers:  . Unable to independently monitor therapeutic efficacy . Unable to achieve control of overactive bladder   Pharmacist Clinical Goal(s):  Marland Kitchen Patient will achieve adherence to monitoring guidelines and medication adherence to achieve therapeutic efficacy . achieve control of OAB as evidenced by patient report through collaboration with PharmD and provider.   Interventions: . 1:1 collaboration with Binnie Rail, MD regarding development and update of comprehensive plan of care as evidenced by provider attestation and co-signature . Inter-disciplinary care team collaboration (see longitudinal plan of care) . Comprehensive medication review performed; medication list updated in electronic medical record  Hyperlipidemia / CAD: (LDL goal < 70) -Controlled - LDL is at goal; pt reports occasional muscle cramps but it is tolerable -cath 10/2016: moderate to moderate-severe 3 vessel disease, aggressive risk factor modification recommended -hx cerebral infarction 06/2012 -Current treatment: . Rosuvastatin 20 mg daily HS . Clopidogrel 75 mg daily AM . Nitroglycerin 0.4 mg SL prn - never used . Aspirin 325  mg -Educated on Cholesterol goals;  Benefits of statin for ASCVD risk reduction; -Recommended to continue current  medication  Heart Failure / HTN (Goal: BP goal < 130/80, prevent exacerbations) -Controlled - pt reports she takes 40 mg of lasix 3-4 days a week -hx paroxysmal SVT -Last ejection fraction: 60-65% (Date: 11/2016) -HF type: Diastolic -NYHA Class: I (no actitivty limitation) -Current treatment: . Losartan 50 mg daily HS . Furosemide 20 mg daily, extra 20 mg PRN wt gain/edema . Diltiazem CD 180 mg daily HS . Metoprolol PRN breakthrough palpitations?? -Current home BP/HR readings: not checking -Educated on Benefits of medications for managing symptoms and prolonging life Importance of weighing daily; if you gain more than 3 pounds in one day or 5 pounds in one week, take extra 1/2 of furosemide Proper diuretic administration and potassium supplementation -Recommended to continue current medication  Anxiety / Insomnia (Goal: manage symptoms) -Not ideally controlled - pt is getting up multiple times per night due to "bladder spasms" (see below); she asks if taking 1/2 tab of trazodone at bedtime and 1/2 tab when she wakes up a few hrs later will help -Current treatment: . Alprazolam 0.5 mg - 1/2 tab HS prn . Trazodone 100 mg HS -PHQ9: 0 -GAD7: not on file -Connected with PCP for mental health support -Counseled that splitting trazodone dose and taking bedtime then in middle of night is unlikely to help with her issues - addressing bladder issues is necessary -Recommended to continue current medication  Overactive bladder (Goal: reduce frequency/urgency) -Uncontrolled - pt reports for several years she has had "bladder spasms" at night that cause her to get up frequently to use bathroom; Premarin cream twice a week used to help but is not helping anymore; she remembers using a bladder pill in the past that caused significant dry mouth so she stopped -Current treatment  . Premarin vaginal cream twice a week -Medications previously tried: Vesicare (2017) -Counseled on benefits of B3 agonists  (Myrbetriq, Gemtesa) for control of OAB; discussed it will be Tier 3 ($40ish per month) and pt would like to try it. Counseled on potential to increase BP -Plan: Start Myrbetriq 25 mg daily. Monitor BP at home daily and call if BP is consistently > 140/90  GERD (Goal: manage symptoms) -Controlled  -Current treatment  . Famotidine 40 mg daily PM -Patient is satisfied with current regimen and denies issues -Recommended to continue current medication  IBS (Goal: manage symptoms) -Not ideally controlled -pt reports more issues with constipation than diarrhea; she uses loperamide more for frequent BM than diarrhea; she would like to know what to try to help with constipation/straining -Current treatment  . Loperamide 2 mg QOD -Counseled on benefits of Miralax, stool softener, probiotic, and fiber as well as hydration; pt is on fluid-restriction due to HF so does not want to overdo fluids -Recommended Miralax and stool softener PRN  Pain / Osteoarthritis (Goal: manage symptoms) -Controlled - pt reports methocarbamol was prescribed after hand procedure but has helped with back pain significantly; it was prescribed by hand surgeon but she would like to continue it  -Current treatment  . Methocarbamol 500 mg PRN . Tylenol 650 mg q8h PRN -Counseled on risk for oversedation/falls with muscle relaxers; pt takes methocarbamol sparingly and usually at night, denies side effects -Recommended to continue current medication - pt to contact PCP for refills if needed  Migraine (Goal: headache relief) -Controlled - pt reports she has not had a migraine in years -Current treatment  . Sumatriptan  50 mg PRN -Patient is satisfied with current regimen and denies issues -Continue to monitor  Health Maintenance -Vaccine gaps: TDAP -Current therapy:  Marland Kitchen Vitamin B12 500 mcg daily . Vitamin C 1000 mg daily . Vitamin D 2000 IU daily . Multivitamin . Ferrous sulfate 325 mg daily . Magnesium 250 mg  daily . Klor-Con 10 meq daily . Cetirizine 10 mg daily . Fluticasone nasal spray daily -Patient is satisfied with current therapy and denies issues -Pt wants to know which supplements she can discontinue; magnesium, cetirizine and Vitamin C are likely unnecessary/not beneficial -Recommend to continue current medication; may stop magnesium, Vitamin C, cetirizine if desired -Recommended TDAP booster at pharmacy  Patient Goals/Self-Care Activities . Patient will:  - take medications as prescribed focus on medication adherence by pill box check blood pressure daily, document, and provide at future appointments  -[facility] booster [facility] -Try Myrbetriq 25 mg at bedtime for bladder spasms -Try Miralax and/or stool softener -Utilize [facility] for medication synchronization, packaging and delivery  Follow Up Plan: Telephone follow up appointment with care management team member scheduled for: 6 months      Medication Assistance: None required.  Patient affirms current coverage meets needs.  Patient's preferred pharmacy is:  [facility] [place], [place] [address] Phone: [phone] Fax: [phone]  [facility] - [place], [place] - [address] [place] [address] Phone: [phone] Fax: [phone]  [id number] [place] [id number]: [phone]: [phone] Uses pill box? Yes Pt endorses 100% compliance  We discussed: Verbal consent obtained for [facility] enhanced pharmacy services (medication synchronization, adherence packaging, delivery coordination). A medication sync plan was created to allow patient to get all medications delivered once every 30 to 90 days per patient preference. Patient understands they have freedom to choose pharmacy and  clinical pharmacist will coordinate care between all prescribers and [facility].  Patient decided to: Utilize [facility] for medication synchronization, packaging and delivery  Care Plan and Follow Up Patient Decision:  Patient agrees to Care Plan and Follow-up.  Plan: Telephone follow up appointment with care management team member scheduled for:  6 months  LCharlene Brooke PharmD, BWillcox CPP Clinical Pharmacist [phone]

## 2020-08-11 NOTE — Patient Instructions (Addendum)
Visit Information  Phone number for Pharmacist: [phone]  Thank you for meeting with me to discuss your medications! I look forward to working with you to achieve your health care goals. Below is a summary of what we talked about during the visit:  Goals Addressed            This Visit's Progress   . Manage My Medicine       Timeframe:  Long-Range Goal Priority:  Medium Start Date:     08/11/20                        Expected End Date:  08/11/21                      Follow Up Date 02/13/21   - call for medicine refill 2 or 3 days before it runs out - call if I am sick and can't take my medicine - keep a list of all the medicines I take; vitamins and herbals too  -Get TDAP booster at [facility] -Try Myrbetriq 25 mg at bedtime for bladder spasms -Try Miralax and/or stool softener -Utilize [facility] for medication synchronization, packaging and delivery  Why is this important?   . These steps will help you keep on track with your medicines.   Notes:       Patient Care Plan: CCM Pharmacy Care Plan    Problem Identified: Hypertension, Hyperlipidemia, Heart Failure, Coronary Artery Disease, GERD, Osteoarthritis and Overactive Bladder, hx stroke, migraine   Priority: High    Long-Range Goal: Disease management   Start Date: 08/11/2020  Expected End Date: 08/11/2021  This Visit's Progress: On track  Priority: High  Note:   Current Barriers:  . Unable to independently monitor therapeutic efficacy . Unable to achieve control of overactive bladder   Pharmacist Clinical Goal(s):  Marland Kitchen Patient will achieve adherence to monitoring guidelines and medication adherence to achieve therapeutic efficacy . achieve control of OAB as evidenced by patient report through collaboration with PharmD and provider.   Interventions: . 1:1 collaboration with Binnie Rail, MD regarding development and update of comprehensive plan of care as evidenced by provider attestation and  co-signature . Inter-disciplinary care team collaboration (see longitudinal plan of care) . Comprehensive medication review performed; medication list updated in electronic medical record  Hyperlipidemia / CAD: (LDL goal < 70) -Controlled - LDL is at goal; pt reports occasional muscle cramps but it is tolerable -cath 10/2016: moderate to moderate-severe 3 vessel disease, aggressive risk factor modification recommended -hx cerebral infarction 06/2012 -Current treatment: . Rosuvastatin 20 mg daily HS . Clopidogrel 75 mg daily AM . Nitroglycerin 0.4 mg SL prn - never used . Aspirin 325 mg -Educated on Cholesterol goals;  Benefits of statin for ASCVD risk reduction; -Recommended to continue current medication  Heart Failure / HTN (Goal: BP goal < 130/80, prevent exacerbations) -Controlled - pt reports she takes 40 mg of lasix 3-4 days a week -hx paroxysmal SVT -Last ejection fraction: 60-65% (Date: 11/2016) -HF type: Diastolic -NYHA Class: I (no actitivty limitation) -Current treatment: . Losartan 50 mg daily HS . Furosemide 20 mg daily, extra 20 mg PRN wt gain/edema . Diltiazem CD 180 mg daily HS . Metoprolol PRN breakthrough palpitations?? -Current home BP/HR readings: not checking -Educated on Benefits of medications for managing symptoms and prolonging life Importance of weighing daily; if you gain more than 3 pounds in one day or 5 pounds in one week, take  extra 1/2 of furosemide Proper diuretic administration and potassium supplementation -Recommended to continue current medication  Anxiety / Insomnia (Goal: manage symptoms) -Not ideally controlled - pt is getting up multiple times per night due to "bladder spasms" (see below); she asks if taking 1/2 tab of trazodone at bedtime and 1/2 tab when she wakes up a few hrs later will help -Current treatment: . Alprazolam 0.5 mg - 1/2 tab HS prn . Trazodone 100 mg HS -PHQ9: 0 -GAD7: not on file -Connected with PCP for mental health  support -Counseled that splitting trazodone dose and taking bedtime then in middle of night is unlikely to help with her issues - addressing bladder issues is necessary -Recommended to continue current medication  Overactive bladder (Goal: reduce frequency/urgency) -Uncontrolled - pt reports for several years she has had "bladder spasms" at night that cause her to get up frequently to use bathroom; Premarin cream twice a week used to help but is not helping anymore; she remembers using a bladder pill in the past that caused significant dry mouth so she stopped -Current treatment  . Premarin vaginal cream twice a week -Medications previously tried: Vesicare (2017) -Counseled on benefits of B3 agonists (Myrbetriq, Gemtesa) for control of OAB; discussed it will be Tier 3 ($40ish per month) and pt would like to try it. Counseled on potential to increase BP -Plan: Start Myrbetriq 25 mg daily. Monitor BP at home daily and call if BP is consistently > 140/90  GERD (Goal: manage symptoms) -Controlled  -Current treatment  . Famotidine 40 mg daily PM -Patient is satisfied with current regimen and denies issues -Recommended to continue current medication  IBS (Goal: manage symptoms) -Not ideally controlled -pt reports more issues with constipation than diarrhea; she uses loperamide more for frequent BM than diarrhea; she would like to know what to try to help with constipation/straining -Current treatment  . Loperamide 2 mg QOD -Counseled on benefits of Miralax, stool softener, probiotic, and fiber as well as hydration; pt is on fluid-restriction due to HF so does not want to overdo fluids -Recommended Miralax and stool softener PRN  Pain / Osteoarthritis (Goal: manage symptoms) -Controlled - pt reports methocarbamol was prescribed after hand procedure but has helped with back pain significantly; it was prescribed by hand surgeon but she would like to continue it  -Current treatment   . Methocarbamol 500 mg PRN . Tylenol 650 mg q8h PRN -Counseled on risk for oversedation/falls with muscle relaxers; pt takes methocarbamol sparingly and usually at night, denies side effects -Recommended to continue current medication - pt to contact PCP for refills if needed  Migraine (Goal: headache relief) -Controlled - pt reports she has not had a migraine in years -Current treatment  . Sumatriptan 50 mg PRN -Patient is satisfied with current regimen and denies issues -Continue to monitor  Health Maintenance -Vaccine gaps: TDAP -Current therapy:  Marland Kitchen Vitamin B12 500 mcg daily . Vitamin C 1000 mg daily . Vitamin D 2000 IU daily . Multivitamin . Ferrous sulfate 325 mg daily . Magnesium 250 mg daily . Klor-Con 10 meq daily . Cetirizine 10 mg daily . Fluticasone nasal spray daily -Patient is satisfied with current therapy and denies issues -Pt wants to know which supplements she can discontinue; magnesium, cetirizine and Vitamin C are likely unnecessary/not beneficial -Recommend to continue current medication; may stop magnesium, Vitamin C, cetirizine if desired -Recommended TDAP booster at pharmacy  Patient Goals/Self-Care Activities . Patient will:  - take medications as prescribed focus on medication  adherence by pill box check blood pressure daily, document, and provide at future appointments  -[facility] -Try Myrbetriq 25 mg at bedtime for bladder spasms -Try Miralax and/or stool softener -Utilize [facility] pharmacy for medication synchronization, packaging and delivery  Follow Up Plan: Telephone follow up appointment with care management team member scheduled for: 6 months      Ms. Jensen was given information about Chronic Care Management services today including:  1. CCM service includes personalized support from designated clinical staff supervised by her physician, including individualized plan of care and coordination with other care  providers 2. 24/7 contact phone numbers for assistance for urgent and routine care needs. 3. Standard insurance, coinsurance, copays and deductibles apply for chronic care management only during months in which we provide at least 20 minutes of these services. Most insurances cover these services at 100%, however patients may be responsible for any copay, coinsurance and/or deductible if applicable. This service may help you avoid the need for more expensive face-to-face services. 4. Only one practitioner may furnish and bill the service in a calendar month. 5. The patient may stop CCM services at any time (effective at the end of the month) by phone call to the office staff.  Patient agreed to services and verbal consent obtained.   Patient verbalizes understanding of instructions provided today and agrees to view in [place].  Telephone follow up appointment with pharmacy team member scheduled for: 6 months  Charlene Brooke, PharmD, [place], CPP Clinical Pharmacist [facility] [phone]  Overactive Bladder, Adult  Overactive bladder is a condition in which a person has a sudden and frequent need to urinate. A person might also leak urine if he or she cannot get to the bathroom fast enough (urinary incontinence). Sometimes, symptoms can interfere with work or social activities. What are the causes? Overactive bladder is associated with poor nerve signals between your bladder and your brain. Your bladder may get the signal to empty before it is full. You may also have very sensitive muscles that make your bladder squeeze too soon. This condition may also be caused by other factors, such as:  Medical conditions: ? Urinary tract infection. ? Infection of nearby tissues. ? Prostate enlargement. ? Bladder stones, inflammation, or tumors. ? Diabetes. ? Muscle or nerve weakness, especially from these conditions:  A spinal cord injury.  Stroke.  Multiple  sclerosis.  Parkinson's disease.  Other causes: ? Surgery on the uterus or urethra. ? Drinking too much caffeine or alcohol. ? Certain medicines, especially those that eliminate extra fluid in the body (diuretics). ? Constipation. What increases the risk? You may be at greater risk for overactive bladder if you:  Are an older adult.  Smoke.  Are going through menopause.  Have prostate problems.  Have a neurological disease, such as stroke, dementia, Parkinson's disease, or multiple sclerosis (MS).  Eat or drink alcohol, spicy food, caffeine, and other things that irritate the bladder.  Are overweight or obese. What are the signs or symptoms? Symptoms of this condition include a sudden, strong urge to urinate. Other symptoms include:  Leaking urine.  Urinating 8 or more times a day.  Waking up to urinate 2 or more times overnight. How is this diagnosed? This condition may be diagnosed based on:  Your symptoms and medical history.  A physical exam.  Blood or urine tests to check for possible causes, such as infection. You may also need to see a health care provider  who specializes in urinary tract problems. This is called a urologist. How is this treated? Treatment for overactive bladder depends on the cause of your condition and whether it is mild or severe. Treatment may include:  Bladder training, such as: ? Learning to control the urge to urinate by following a schedule to urinate at regular intervals. ? Doing Kegel exercises to strengthen the pelvic floor muscles that support your bladder.  Special devices, such as: ? Biofeedback. This uses sensors to help you become aware of your body's signals. ? Electrical stimulation. This uses electrodes placed inside the body (implanted) or outside the body. These electrodes send gentle pulses of electricity to strengthen the nerves or muscles that control the bladder. ? Women may use a plastic device, called a pessary,  that fits into the vagina and supports the bladder.  Medicines, such as: ? Antibiotics to treat bladder infection. ? Antispasmodics to stop the bladder from releasing urine at the wrong time. ? Tricyclic antidepressants to relax bladder muscles. ? Injections of botulinum toxin type A directly into the bladder tissue to relax bladder muscles.  Surgery, such as: ? A device may be implanted to help manage the nerve signals that control urination. ? An electrode may be implanted to stimulate electrical signals in the bladder. ? A procedure may be done to change the shape of the bladder. This is done only in very severe cases. Follow these instructions at home: Eating and drinking  Make diet or lifestyle changes recommended by your health care provider. These may include: ? Drinking fluids throughout the day and not only with meals. ? Cutting down on caffeine or alcohol. ? Eating a healthy and balanced diet to prevent constipation. This may include:  Choosing foods that are high in fiber, such as beans, whole grains, and fresh fruits and vegetables.  Limiting foods that are high in fat and processed sugars, such as fried and sweet foods.   Lifestyle  Lose weight if needed.  Do not use any products that contain nicotine or tobacco. These include cigarettes, chewing tobacco, and vaping devices, such as e-cigarettes. If you need help quitting, ask your health care provider.   General instructions  Take over-the-counter and prescription medicines only as told by your health care provider.  If you were prescribed an antibiotic medicine, take it as told by your health care provider. Do not stop taking the antibiotic even if you start to feel better.  Use any implants or pessary as told by your health care provider.  If needed, wear pads to absorb urine leakage.  Keep a log to track how much and when you drink, and when you need to urinate. This will help your health care provider monitor  your condition.  Keep all follow-up visits. This is important. Contact a health care provider if:  You have a fever or chills.  Your symptoms do not get better with treatment.  Your pain and discomfort get worse.  You have more frequent urges to urinate. Get help right away if:  You are not able to control your bladder. Summary  Overactive bladder refers to a condition in which a person has a sudden and frequent need to urinate.  Several conditions may lead to an overactive bladder.  Treatment for overactive bladder depends on the cause and severity of your condition.  Making lifestyle changes, doing Kegel exercises, keeping a log, and taking medicines can help with this condition. This information is not intended to replace advice given to  you by your health care provider. Make sure you discuss any questions you have with your health care provider. Document Revised: 12/22/2019 Document Reviewed: 12/22/2019 Elsevier Patient Education  2021 [employer].

## 2020-08-11 NOTE — Progress Notes (Signed)
    Chronic Care Management Pharmacy Assistant   Name: Susan Gillespie  MRN: [id number] DOB: Oct 08, 1945   Reason for Encounter: Susan Gillespie    Medications: Outpatient Encounter Medications as of 08/11/2020  Medication Sig  . acetaminophen (TYLENOL) 650 MG CR tablet Take 1,300 mg by mouth every 8 (eight) hours as needed for pain.   Marland Kitchen ALPRAZolam (XANAX) 0.5 MG tablet TAKE 0.5 TABLETS (0.25 MG TOTAL) BY MOUTH AT BEDTIME AS NEEDED FOR ANXIETY OR SLEEP.  Marland Kitchen aspirin 325 MG tablet Take 1 tablet (325 mg total) by mouth daily.  . cetirizine (ZYRTEC) 10 MG tablet Take 10 mg by mouth every morning.  . clopidogrel (PLAVIX) 75 MG tablet TAKE 1 TABLET BY MOUTH EVERY DAY  . diltiazem (CARDIZEM CD) 180 MG 24 hr capsule Take 1 capsule (180 mg total) by mouth at bedtime.  . famotidine (PEPCID) 40 MG tablet Take 1 tablet (40 mg total) by mouth daily.  . ferrous sulfate 325 (65 FE) MG tablet Take 325 mg by mouth daily with breakfast.  . fluticasone (FLONASE) 50 MCG/ACT nasal spray Place into both nostrils daily.  . furosemide (LASIX) 40 MG tablet Take 1/2 tablet by mouth daily. May take additional 1/2 tablet as needed for Edema.  Marland Kitchen loperamide (IMODIUM A-D) 2 MG tablet Take 2 mg by mouth every other day.   . losartan (COZAAR) 50 MG tablet TAKE 1 TABLET BY MOUTH EVERY DAY  . methocarbamol (ROBAXIN) 500 MG tablet Take by mouth.  . nitroGLYCERIN (NITROSTAT) 0.4 MG SL tablet Place 1 tablet (0.4 mg total) under the tongue every 5 (five) minutes as needed for chest pain.  . potassium chloride (KLOR-CON) 10 MEQ tablet TAKE 1 TABLET BY MOUTH EVERY DAY  . PREMARIN vaginal cream 1 APPLICATOR FULL INTO VAGINA TWICE A WEEK  . rosuvastatin (CRESTOR) 20 MG tablet TAKE 1 TABLET BY MOUTH EVERYDAY AT BEDTIME  . SUMAtriptan (IMITREX) 50 MG tablet Take 50 mg by mouth every 2 (two) hours as needed for migraine. May repeat in 2 hours if headache persists or recurs.  . traZODone (DESYREL) 100 MG tablet Take 1 tablet (100 mg total)  by mouth at bedtime.  . vitamin B-12 (CYANOCOBALAMIN) 500 MCG tablet Take 500 mcg by mouth daily.   No facility-administered encounter medications on file as of 08/11/2020.    Pharmacist Review  The patient had and initial visit with the clinical pharmacist Charlene Brooke on 08/11/20. Upon completion of the visit the patient has agreed to try [facility] pharmacy services for their dispensing and delivery of medications. Per clinical pharmacist request I completed an on boarding form with the list of the patients medications, current pharmacy, demographics, allergies and insurance information, The form was then forward to clinical pharmacist for review.   [place] Pharmacist Assistant [phone]  Time spent:60

## 2020-08-13 MED ORDER — ROSUVASTATIN CALCIUM 20 MG PO TABS: 20.0000 mg | ORAL_TABLET | Freq: Every day | ORAL | 0 refills | Status: DC

## 2020-08-13 MED ORDER — LOSARTAN POTASSIUM 50 MG PO TABS: 1.0000 | ORAL_TABLET | Freq: Every day | ORAL | 0 refills | Status: DC

## 2020-08-13 MED ORDER — POTASSIUM CHLORIDE ER 10 MEQ PO TBCR: 10.0000 meq | EXTENDED_RELEASE_TABLET | Freq: Every day | ORAL | 0 refills | Status: DC

## 2020-08-13 MED ORDER — MIRABEGRON ER 25 MG PO TB24: 25.0000 mg | ORAL_TABLET | Freq: Every day | ORAL | 0 refills | Status: DC

## 2020-08-13 NOTE — Addendum Note (Signed)
Addended by: Charlton Haws on: 08/13/2020 05:05 PM   Modules accepted: Orders

## 2020-08-30 ENCOUNTER — Telehealth: Payer: Self-pay | Admitting: Pharmacist

## 2020-08-30 DIAGNOSIS — N3289 Other specified disorders of bladder: Secondary | ICD-10-CM

## 2020-08-30 NOTE — Progress Notes (Addendum)
    Chronic Care Management Pharmacy Assistant   Name: Susan Gillespie  MRN: [id number] DOB: Nov 26, 1945  Called and spoke with patient to get a pill count on the following medications: Plavix-5 months, patient states she has 5weeks and 90 ds Diltiazem- patient states she has 5 months Famotidine- patient states she has 10 weeks Furosemide- patient states she has enough for 1 year. She received a supply on 08/09/20 and another on 08/19/20 Losartan-patient states she has 6 1/2 months left Potassium- patient states she has 8 months Rosuvastatin-patient states she hs 5 months, a whole 90 ds and 41 tabs Trazodone- patient states she has 4 weeks left Myrebetriq- patient states she needs a 90 ds sent in to the pharmacy Xanax- patient states she needs a refill only has 4 tabs left   [place] Pharmacist Assistant [phone]  Time spent:27

## 2020-09-06 ENCOUNTER — Telehealth: Payer: Self-pay | Admitting: Internal Medicine

## 2020-09-06 NOTE — Telephone Encounter (Signed)
LVM for pt to rtn my call to schedule awv with [facility]. Please schedule awv if pt calls the office.

## 2020-09-10 MED ORDER — MIRABEGRON ER 25 MG PO TB24: 25.0000 mg | ORAL_TABLET | Freq: Every day | ORAL | 0 refills | Status: DC

## 2020-09-10 NOTE — Addendum Note (Signed)
Addended by: Charlton Haws on: 09/10/2020 10:52 AM   Modules accepted: Orders

## 2020-09-15 ENCOUNTER — Other Ambulatory Visit: Payer: Self-pay | Admitting: Internal Medicine

## 2020-09-17 ENCOUNTER — Other Ambulatory Visit: Payer: Self-pay

## 2020-09-17 ENCOUNTER — Ambulatory Visit (INDEPENDENT_AMBULATORY_CARE_PROVIDER_SITE_OTHER): Payer: PPO

## 2020-09-17 VITALS — BP 120/60 | HR 52 | Temp 98.2°F | Ht 63.0 in | Wt 157.0 lb

## 2020-09-17 DIAGNOSIS — Z0000 Encounter for general adult medical examination without abnormal findings: Secondary | ICD-10-CM

## 2020-09-17 NOTE — Patient Instructions (Signed)
Ms. Vargo , Thank you for taking time to come for your Medicare Wellness Visit. I appreciate your ongoing commitment to your health goals. Please review the following plan we discussed and let me know if I can assist you in the future.   Screening recommendations/referrals: Colonoscopy: last done 05/05/2016; no repeat due to age 75: last done 02/16/2020; due every year Bone Density: last done 01/13/2019; due every 5 years Recommended yearly ophthalmology/optometry visit for glaucoma screening and checkup Recommended yearly dental visit for hygiene and checkup  Vaccinations: Influenza vaccine: 01/28/2020 Pneumococcal vaccine: 01/12/2014, 11/06/2018 Tdap vaccine: last done 04/2017; due every 10 years (overdue; can check with local pharmacy for vaccine) Shingles vaccine: 02/14/2019, 04/18/2019   Covid-19: 05/02/2019, 05/23/2019, 02/21/2020, 07/31/2020  Advanced directives: Please bring a copy of your health care power of attorney and living will to the office at your convenience.  Conditions/risks identified: Yes; Reviewed health maintenance screenings with patient today and relevant education, vaccines, and/or referrals were provided. Please continue to do your personal lifestyle choices by: daily care of teeth and gums, regular physical activity (goal should be 5 days a week for 30 minutes), eat a healthy diet, avoid tobacco and drug use, limiting any alcohol intake, taking a low-dose aspirin (if not allergic or have been advised by your provider otherwise) and taking vitamins and minerals as recommended by your provider. Continue doing brain stimulating activities (puzzles, reading, adult coloring books, staying active) to keep memory sharp. Continue to eat heart healthy diet (full of fruits, vegetables, whole grains, lean protein, water--limit salt, fat, and sugar intake) and increase physical activity as tolerated.  Next appointment: Please schedule your next Medicare Wellness Visit with your Nurse  Health Advisor in 1 year by calling [phone].   Preventive Care 83 Years and Older, Female Preventive care refers to lifestyle choices and visits with your health care provider that can promote health and wellness. What does preventive care include?  A yearly physical exam. This is also called an annual well check.  Dental exams once or twice a year.  Routine eye exams. Ask your health care provider how often you should have your eyes checked.  Personal lifestyle choices, including:  Daily care of your teeth and gums.  Regular physical activity.  Eating a healthy diet.  Avoiding tobacco and drug use.  Limiting alcohol use.  Practicing safe sex.  Taking low-dose aspirin every day.  Taking vitamin and mineral supplements as recommended by your health care provider. What happens during an annual well check? The services and screenings done by your health care provider during your annual well check will depend on your age, overall health, lifestyle risk factors, and family history of disease. Counseling  Your health care provider may ask you questions about your:  Alcohol use.  Tobacco use.  Drug use.  Emotional well-being.  Home and relationship well-being.  Sexual activity.  Eating habits.  History of falls.  Memory and ability to understand (cognition).  Work and work Statistician.  Reproductive health. Screening  You may have the following tests or measurements:  Height, weight, and BMI.  Blood pressure.  Lipid and cholesterol levels. These may be checked every 5 years, or more frequently if you are over 64 years old.  Skin check.  Lung cancer screening. You may have this screening every year starting at age 20 if you have a 30-pack-year history of smoking and currently smoke or have quit within the past 15 years.  Fecal occult blood test (FOBT) of the  stool. You may have this test every year starting at age 26.  Flexible sigmoidoscopy or  colonoscopy. You may have a sigmoidoscopy every 5 years or a colonoscopy every 10 years starting at age 45.  Hepatitis C blood test.  Hepatitis B blood test.  Sexually transmitted disease (STD) testing.  Diabetes screening. This is done by checking your blood sugar (glucose) after you have not eaten for a while (fasting). You may have this done every 1-3 years.  Bone density scan. This is done to screen for osteoporosis. You may have this done starting at age 16.  Mammogram. This may be done every 1-2 years. Talk to your health care provider about how often you should have regular mammograms. Talk with your health care provider about your test results, treatment options, and if necessary, the need for more tests. Vaccines  Your health care provider may recommend certain vaccines, such as:  Influenza vaccine. This is recommended every year.  Tetanus, diphtheria, and acellular pertussis (Tdap, Td) vaccine. You may need a Td booster every 10 years.  Zoster vaccine. You may need this after age 12.  Pneumococcal 13-valent conjugate (PCV13) vaccine. One dose is recommended after age 47.  Pneumococcal polysaccharide (PPSV23) vaccine. One dose is recommended after age 70. Talk to your health care provider about which screenings and vaccines you need and how often you need them. This information is not intended to replace advice given to you by your health care provider. Make sure you discuss any questions you have with your health care provider. Document Released: 04/30/2015 Document Revised: 12/22/2015 Document Reviewed: 02/02/2015 Elsevier Interactive Patient Education  2017 [place] Prevention in the Home Falls can cause injuries. They can happen to people of all ages. There are many things you can do to make your home safe and to help prevent falls. What can I do on the outside of my home?  Regularly fix the edges of walkways and driveways and fix any cracks.  Remove  anything that might make you trip as you walk through a door, such as a raised step or threshold.  Trim any bushes or trees on the path to your home.  Use bright outdoor lighting.  Clear any walking paths of anything that might make someone trip, such as rocks or tools.  Regularly check to see if handrails are loose or broken. Make sure that both sides of any steps have handrails.  Any raised decks and porches should have guardrails on the edges.  Have any leaves, snow, or ice cleared regularly.  Use sand or salt on walking paths during winter.  Clean up any spills in your garage right away. This includes oil or grease spills. What can I do in the bathroom?  Use night lights.  Install grab bars by the toilet and in the tub and shower. Do not use towel bars as grab bars.  Use non-skid mats or decals in the tub or shower.  If you need to sit down in the shower, use a plastic, non-slip stool.  Keep the floor dry. Clean up any water that spills on the floor as soon as it happens.  Remove soap buildup in the tub or shower regularly.  Attach bath mats securely with double-sided non-slip rug tape.  Do not have throw rugs and other things on the floor that can make you trip. What can I do in the bedroom?  Use night lights.  Make sure that you have a light by your bed  that is easy to reach.  Do not use any sheets or blankets that are too big for your bed. They should not hang down onto the floor.  Have a firm chair that has side arms. You can use this for support while you get dressed.  Do not have throw rugs and other things on the floor that can make you trip. What can I do in the kitchen?  Clean up any spills right away.  Avoid walking on wet floors.  Keep items that you use a lot in easy-to-reach places.  If you need to reach something above you, use a strong step stool that has a grab bar.  Keep electrical cords out of the way.  Do not use floor polish or wax that  makes floors slippery. If you must use wax, use non-skid floor wax.  Do not have throw rugs and other things on the floor that can make you trip. What can I do with my stairs?  Do not leave any items on the stairs.  Make sure that there are handrails on both sides of the stairs and use them. Fix handrails that are broken or loose. Make sure that handrails are as long as the stairways.  Check any carpeting to make sure that it is firmly attached to the stairs. Fix any carpet that is loose or worn.  Avoid having throw rugs at the top or bottom of the stairs. If you do have throw rugs, attach them to the floor with carpet tape.  Make sure that you have a light switch at the top of the stairs and the bottom of the stairs. If you do not have them, ask someone to add them for you. What else can I do to help prevent falls?  Wear shoes that:  Do not have high heels.  Have rubber bottoms.  Are comfortable and fit you well.  Are closed at the toe. Do not wear sandals.  If you use a stepladder:  Make sure that it is fully opened. Do not climb a closed stepladder.  Make sure that both sides of the stepladder are locked into place.  Ask someone to hold it for you, if possible.  Clearly mark and make sure that you can see:  Any grab bars or handrails.  First and last steps.  Where the edge of each step is.  Use tools that help you move around (mobility aids) if they are needed. These include:  Canes.  Walkers.  Scooters.  Crutches.  Turn on the lights when you go into a dark area. Replace any light bulbs as soon as they burn out.  Set up your furniture so you have a clear path. Avoid moving your furniture around.  If any of your floors are uneven, fix them.  If there are any pets around you, be aware of where they are.  Review your medicines with your doctor. Some medicines can make you feel dizzy. This can increase your chance of falling. Ask your doctor what other  things that you can do to help prevent falls. This information is not intended to replace advice given to you by your health care provider. Make sure you discuss any questions you have with your health care provider. Document Released: 01/28/2009 Document Revised: 09/09/2015 Document Reviewed: 05/08/2014 Elsevier Interactive Patient Education  2017 [employer].

## 2020-09-17 NOTE — Progress Notes (Signed)
Subjective:   Susan Gillespie is a 75 y.o. female who presents for Medicare Annual (Subsequent) preventive examination.  Review of Systems    No ROS. Medicare Wellness Visit. Additional risk factors are reflected in social history. Cardiac Risk Factors include: advanced age (>56men, >101 women);hypertension;family history of premature cardiovascular disease;dyslipidemia     Objective:    Today's Vitals   09/17/20 1312  BP: 120/60  Pulse: (!) 52  Temp: 98.2 F (36.8 C)  SpO2: 95%  Weight: 157 lb (71.2 kg)  Height: 5\' 3"  (1.6 m)  PainSc: 0-No pain   Body mass index is 27.81 kg/m.  Advanced Directives 09/17/2020 07/29/2019 12/24/2018 11/10/2016 05/09/2016 05/05/2016 05/02/2016  Does Patient Have a Medical Advance Directive? Yes Yes Yes Yes Yes - Yes  Type of Advance Directive Living will;Healthcare Power of Attorney Living will [place];Living will [place];Living will Living will;Healthcare Power of Attorney - -  Does patient want to make changes to medical advance directive? No - Patient declined - - No - Patient declined - - -  Copy of [place] in Chart? No - copy requested - - No - copy requested - No - copy requested No - copy requested  Pre-existing out of facility DNR order (yellow form or pink MOST form) - - - - - - -    Current Medications (verified) Outpatient Encounter Medications as of 09/17/2020  Medication Sig  . ALPRAZolam (XANAX) 0.5 MG tablet TAKE 0.5 TABLETS (0.25 MG TOTAL) BY MOUTH AT BEDTIME AS NEEDED FOR ANXIETY OR SLEEP.  Marland Kitchen acetaminophen (TYLENOL) 650 MG CR tablet Take 1,300 mg by mouth every 8 (eight) hours as needed for pain.   Marland Kitchen aspirin 325 MG tablet Take 1 tablet (325 mg total) by mouth daily.  . cetirizine (ZYRTEC) 10 MG tablet Take 10 mg by mouth every morning.  . clopidogrel (PLAVIX) 75 MG tablet TAKE 1 TABLET BY MOUTH EVERY DAY  . diltiazem (CARDIZEM CD) 180 MG 24 hr capsule Take 1 capsule (180 mg  total) by mouth at bedtime.  . famotidine (PEPCID) 40 MG tablet Take 1 tablet (40 mg total) by mouth daily.  . ferrous sulfate 325 (65 FE) MG tablet Take 325 mg by mouth daily with breakfast.  . fluticasone (FLONASE) 50 MCG/ACT nasal spray Place into both nostrils daily.  . furosemide (LASIX) 40 MG tablet Take 1/2 tablet by mouth daily. May take additional 1/2 tablet as needed for Edema.  Marland Kitchen loperamide (IMODIUM A-D) 2 MG tablet Take 2 mg by mouth every other day.  (Patient not taking: Reported on 09/17/2020)  . losartan (COZAAR) 50 MG tablet Take 1 tablet (50 mg total) by mouth daily.  . methocarbamol (ROBAXIN) 500 MG tablet Take by mouth.  . mirabegron ER (MYRBETRIQ) 25 MG TB24 tablet Take 1 tablet (25 mg total) by mouth daily.  . nitroGLYCERIN (NITROSTAT) 0.4 MG SL tablet Place 1 tablet (0.4 mg total) under the tongue every 5 (five) minutes as needed for chest pain.  . potassium chloride (KLOR-CON) 10 MEQ tablet Take 1 tablet (10 mEq total) by mouth daily.  Marland Kitchen PREMARIN vaginal cream 1 APPLICATOR FULL INTO VAGINA TWICE A WEEK  . rosuvastatin (CRESTOR) 20 MG tablet Take 1 tablet (20 mg total) by mouth daily.  . SUMAtriptan (IMITREX) 50 MG tablet Take 50 mg by mouth every 2 (two) hours as needed for migraine. May repeat in 2 hours if headache persists or recurs.  . traZODone (DESYREL)  100 MG tablet Take 1 tablet (100 mg total) by mouth at bedtime.  . vitamin B-12 (CYANOCOBALAMIN) 500 MCG tablet Take 500 mcg by mouth daily.   No facility-administered encounter medications on file as of 09/17/2020.    Allergies (verified) Phenergan [promethazine hcl], Daypro [oxaprozin], Lisinopril, Oxycodone, Simvastatin, and Tramadol   History: Past Medical History:  Diagnosis Date  . Anxiety   . Artery stenosis (HCC)    left internal carotid, left subclavian  . Arthritis   . Bleeding tendency ([place]) 05/16/2012   Hx post op bleeding (epidural hematoma s/p ACDF '10; LLQ hematoma due to superficial vessel fascial  bleed post colon resection; Normal platelet count; saw Dr. [name] '14  . CAD in native artery    a. mod to mod-severe CAD by cath 10/2016, med rx.  . Carotid stenosis, bilateral 08/31/2016   1-39% right and 50-69% lefft ICA stenosis dopplers 04/2019  . Chronic diastolic CHF (congestive heart failure) ([place])   . Colon polyp   . Diverticulitis   . Family history of cardiovascular disease   . Fracture of right fibula   . GERD (gastroesophageal reflux disease)   . Headache(784.0)    mirgraines - history of  . Hearing aid worn    B/L  . HOH (hard of hearing)    wears hearing aids  . Hyperlipidemia   . Hypertension   . IBS (irritable bowel syndrome)    followed by Dr. [name]  . Liver cyst   . Melanoma ([place])    removed from back 04/2013  . Shortness of breath dyspnea    with exertion  . Stroke ([place]) 06/2012   mild  . SVT (supraventricular tachycardia) ([place])   . Urinary urgency   . UTI (lower urinary tract infection)    x 2 since June 2016  . Wears glasses    Past Surgical History:  Procedure Laterality Date  . ABDOMINAL HYSTERECTOMY    . ANGIOPLASTY     stent in subclavian LEFT 2016, angioplasty of stent 2017  . APPENDECTOMY    . AUGMENTATION MAMMAPLASTY    . BREAST EXCISIONAL BIOPSY Left    benign  . BREAST SURGERY     BIL breast augmentation  . CHOLECYSTECTOMY    . COLON SURGERY     Sigmoid Colectomy, returned for post op bleeding  . COLONOSCOPY W/ POLYPECTOMY    . COLONOSCOPY WITH PROPOFOL N/A 05/05/2016   Procedure: COLONOSCOPY WITH PROPOFOL;  Surgeon: Alphonsa Overall, MD;  Location: [facility];  Service: General;  Laterality: N/A;  . ESOPHAGOGASTRODUODENOSCOPY (EGD) WITH PROPOFOL N/A 05/05/2016   Procedure: ESOPHAGOGASTRODUODENOSCOPY (EGD) WITH PROPOFOL;  Surgeon: Alphonsa Overall, MD;  Location: [facility];  Service: General;  Laterality: N/A;  . EYE SURGERY     eyelid drooping fixed  . IR ANGIO INTRA EXTRACRAN SEL COM CAROTID INNOMINATE BILAT MOD SED  10/16/2018  . IR  ANGIO VERTEBRAL SEL SUBCLAVIAN INNOMINATE UNI L MOD SED  10/16/2018  . IR ANGIO VERTEBRAL SEL VERTEBRAL UNI R MOD SED  10/16/2018  . IR GENERIC HISTORICAL  12/24/2015   IR ANGIO INTRA EXTRACRAN SEL COM CAROTID INNOMINATE BILAT MOD SED 12/24/2015 Luanne Bras, MD [facility]  . IR GENERIC HISTORICAL  12/24/2015   IR ANGIOGRAM EXTREMITY LEFT 12/24/2015 Luanne Bras, MD [facility]  . IR GENERIC HISTORICAL  12/24/2015   IR ANGIO VERTEBRAL SEL VERTEBRAL UNI R MOD SED 12/24/2015 Luanne Bras, MD [facility]  . IR GENERIC HISTORICAL  01/05/2016   IR PTA NON CORO-LOWER EXTREM  01/05/2016 Luanne Bras, MD [facility]  . IR GENERIC HISTORICAL  03/07/2016   IR RADIOLOGIST EVAL & MGMT 03/07/2016 [facility]  . JOINT REPLACEMENT Left    thumb  . LEFT HEART CATH AND CORONARY ANGIOGRAPHY N/A 11/10/2016   Procedure: Left Heart Cath and Coronary Angiography;  Surgeon: Belva Crome, MD;  Location: [facility];  Service: Cardiovascular;  Laterality: N/A;  . MASTOID DEBRIDEMENT    . MASTOIDECTOMY REVISION    . RADIOLOGY WITH ANESTHESIA N/A 11/09/2014   Procedure: STENT PLACEMENT;  Surgeon: Luanne Bras, MD;  Location: [facility];  Service: Radiology;  Laterality: N/A;  . RADIOLOGY WITH ANESTHESIA N/A 05/31/2015   Procedure: RADIOLOGY WITH ANESTHESIA;  Surgeon: Luanne Bras, MD;  Location: [facility];  Service: Radiology;  Laterality: N/A;  . RADIOLOGY WITH ANESTHESIA N/A 01/05/2016   Procedure: RADIOLOGY WITH ANESTHESIA ANGIOPLASTY WITH STENTNG;  Surgeon: Luanne Bras, MD;  Location: [facility];  Service: Radiology;  Laterality: N/A;  . RADIOLOGY WITH ANESTHESIA N/A 10/16/2018   Procedure: STENTING;  Surgeon: Luanne Bras, MD;  Location: [facility];  Service: Radiology;  Laterality: N/A;  . skin cancer excised  04/2013   Melonoma  . SPINE SURGERY     cervical fusion, returned to OR for post op  bleeding  . TONSILLECTOMY    . TONSILLECTOMY    . TUBAL LIGATION    . VAGINA SURGERY      anterior posterior repair   Family History  Problem Relation Age of Onset  . Hyperlipidemia Other   . Hypertension Other   . Cancer Other        lung, esophagus, stomach  . Stroke Other   . Heart disease Father   . Heart disease Sister   . Heart attack Sister   . Heart disease Brother   . Heart attack Brother   . Stroke Son    Social History   Socioeconomic History  . Marital status: Divorced    Spouse name: Not on file  . Number of children: 2  . Years of education: 91  . Highest education level: Not on file  Occupational History  . 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 Smoking status: Former Smoker    Packs/day: 2.00    Years: 22.00    Pack years: 44.00    Types: Cigarettes    Quit date: 04/23/1985    Years since quitting: 35.4  . Smokeless tobacco: Never Used  Vaping Use  . Vaping Use: Never used  Substance and Sexual Activity  . Alcohol use: Yes    Alcohol/week: 4.0 standard drinks    Types: 4 Glasses of wine per week    Comment: 1 drink a  day  . Drug use: No  . Sexual activity: Yes    Birth control/protection: Surgical  Other Topics Concern  . Not on file  Social History Narrative   ** Merged History Encounter **       Domestic partner   Regular exercise-no   Right handed   Caffeine use-- 2 cups coffee daily, rare soda   Social Determinants of Health   Financial Resource Strain: Low Risk   . Difficulty of Paying Living Expenses: Not hard at all  Food Insecurity: No Food Insecurity  . Worried About Charity fundraiser in the Last Year: Never true  . Ran Out of Food in the Last Year: Never  true  Transportation Needs: No Transportation Needs  . Lack of Transportation (Medical): No  . Lack of Transportation (Non-Medical): No  Physical Activity: Sufficiently Active  . Days of Exercise per Week: 5 days  . Minutes of Exercise per Session: 30 min  Stress: No Stress Concern  Present  . Feeling of Stress : Not at all  Social Connections: Moderately Integrated  . Frequency of Communication with Friends and Family: More than three times a week  . Frequency of Social Gatherings with Friends and Family: Twice a week  . Attends Religious Services: More than 4 times per year  . Active Member of Clubs or Organizations: No  . Attends Archivist Meetings: More than 4 times per year  . Marital Status: Widowed    Tobacco Counseling Counseling given: Not Answered   Clinical Intake:  Pre-visit preparation completed: Yes  Pain : No/denies pain Pain Score: 0-No pain     BMI - recorded: 27.81 Nutritional Status: BMI 25 -29 Overweight Nutritional Risks: None Diabetes: No  How often do you need to have someone help you when you read instructions, pamphlets, or other written materials from your doctor or pharmacy?: 1 - Never What is the last grade level you completed in school?: Associate Degree of Nursing; Retired from [employer] (35 years)  Diabetic? no  Interpreter Needed?: No  Information entered by :: Lisette Abu, LPN   Activities of Daily Living In your present state of health, do you have any difficulty performing the following activities: 09/17/2020 05/13/2020  Hearing? N N  Comment wears hearing aids -  Vision? N N  Difficulty concentrating or making decisions? N N  Walking or climbing stairs? N N  Dressing or bathing? N N  Doing errands, shopping? N N  Preparing Food and eating ? N -  Using the Toilet? N -  In the past six months, have you accidently leaked urine? N -  Do you have problems with loss of bowel control? N -  Managing your Medications? N -  Managing your Finances? N -  Housekeeping or managing your Housekeeping? N -  Some recent data might be hidden    Patient Care Team: Binnie Rail, MD as PCP - General (Internal Medicine) Sueanne Margarita, MD as PCP - Cardiology (Cardiology) Charlton Haws, [facility] as  Pharmacist (Pharmacist)  Indicate any recent Medical Services you may have received from other than [facility] providers in the past year (date may be approximate).     Assessment:   This is a routine wellness examination for [place].  Hearing/Vision screen No exam data present  Dietary issues and exercise activities discussed: Current Exercise Habits: The patient does not participate in regular exercise at present (very active around the house), Exercise limited by: cardiac condition(s);orthopedic condition(s)  Goals Addressed   None    Depression Screen PHQ 2/9 Scores 09/17/2020 05/13/2020 11/11/2019 07/29/2019 05/02/2019 04/05/2018 09/19/2012  PHQ - 2 Score 0 0 0 0 0 0 0  PHQ- 9 Score - 0 0 - - - -    Fall Risk Fall Risk  09/17/2020 07/29/2019 07/29/2019 05/02/2019 02/21/2017  Falls in the past year? 0 0 0 1 No  Number falls in past yr: 0 0 0 1 -  Injury with Fall? 0 0 0 0 -  Risk for fall due to : No Fall Risks No Fall Risks No Fall Risks - -  Follow up Falls evaluation completed - Falls evaluation completed;Education provided - -  FALL RISK PREVENTION PERTAINING TO THE HOME:  Any stairs in or around the home? No  If so, are there any without handrails? No  Home free of loose throw rugs in walkways, pet beds, electrical cords, etc? Yes  Adequate lighting in your home to reduce risk of falls? Yes   ASSISTIVE DEVICES UTILIZED TO PREVENT FALLS:  Life alert? No  Use of a cane, walker or w/c? No  Grab bars in the bathroom? No  Shower chair or bench in shower? Yes  Elevated toilet seat or a handicapped toilet? Yes   TIMED UP AND GO:  Was the test performed? No .  Length of time to ambulate 10 feet: 0 sec.   Gait steady and fast without use of assistive device  Cognitive Function: Normal cognitive status assessed by direct observation by this Nurse Health Advisor. No abnormalities found.       6CIT Screen 07/29/2019  What Year? 0 points  What month? 0 points  What time? 0 points   Count back from 20 0 points  Months in reverse 0 points  Repeat phrase 0 points  Total Score 0    Immunizations Immunization History  Administered Date(s) Administered  . Influenza Whole 02/14/2012  . Influenza, High Dose Seasonal PF 01/19/2017, 02/08/2018, 01/28/2020  . Influenza, Quadrivalent, Recombinant, Inj, Pf 01/18/2019  . Influenza,inj,Quad PF,6+ Mos 01/06/2016  . Influenza,inj,quad, With Preservative 01/12/2014  . Influenza-Unspecified 01/15/2013, 02/03/2015, 01/19/2017  . PFIZER Comirnaty(Gray Top)Covid-19 Tri-Sucrose Vaccine 07/31/2020  . PFIZER(Purple Top)SARS-COV-2 Vaccination 05/02/2019, 05/23/2019, 02/21/2020  . Pneumococcal Conjugate-13 04/28/2009, 01/12/2014  . Pneumococcal Polysaccharide-23 11/06/2018  . Td 04/18/2007  . Zoster Recombinat (Shingrix) 01/18/2019, 04/18/2019  . Zoster, Live 04/28/2009    TDAP status: Due, Education has been provided regarding the importance of this vaccine. Advised may receive this vaccine at local pharmacy or Health Dept. Aware to provide a copy of the vaccination record if obtained from local pharmacy or Health Dept. Verbalized acceptance and understanding.  Flu Vaccine status: Up to date  Pneumococcal vaccine status: Up to date  Covid-19 vaccine status: Completed vaccines  Qualifies for Shingles Vaccine? Yes   Zostavax completed Yes   Shingrix Completed?: Yes  Screening Tests Health Maintenance  Topic Date Due  . Pneumococcal Vaccine 81-23 Years old (1 of 2 - PPSV23) Never done  . TETANUS/TDAP  04/17/2017  . Hepatitis C Screening  04/05/2029 (Originally 04/08/1964)  . INFLUENZA VACCINE  11/15/2020  . DEXA SCAN  01/13/2024  . COVID-19 Vaccine  Completed  . PNA vac Low Risk Adult  Completed  . Zoster Vaccines- Shingrix  Completed  . HPV VACCINES  Aged Out    Health Maintenance  Health Maintenance Due  Topic Date Due  . Pneumococcal Vaccine 87-72 Years old (1 of 2 - PPSV23) Never done  . TETANUS/TDAP  04/17/2017     Colorectal cancer screening: No longer required.   Mammogram status: Completed 02/16/2020. Repeat every year  Bone Density status: Completed 01/13/2019. Results reflect: Bone density results: NORMAL. Repeat every 5 years.  Lung Cancer Screening: (Low Dose CT Chest recommended if Age 61-80 years, 30 pack-year currently smoking OR have quit w/in 15years.) does not qualify.   Lung Cancer Screening Referral: no  Additional Screening:  Hepatitis C Screening: does qualify; Completed no  Vision Screening: Recommended annual ophthalmology exams for early detection of glaucoma and other disorders of the eye. Is the patient up to date with their annual eye exam?  Yes  Who is the provider or what  is the name of the office in which the patient attends annual eye exams? Darleen Crocker, [place], OD. If pt is not established with a provider, would they like to be referred to a provider to establish care? No .   Dental Screening: Recommended annual dental exams for proper oral hygiene  Community Resource Referral / Chronic Care Management: CRR required this visit?  No   CCM required this visit?  No      Plan:     I have personally reviewed and noted the following in the patient's chart:   . Medical and social history . Use of alcohol, tobacco or illicit drugs  . Current medications and supplements including opioid prescriptions.  . Functional ability and status . Nutritional status . Physical activity . Advanced directives . List of other physicians . Hospitalizations, surgeries, and ER visits in previous 12 months . Vitals . Screenings to include cognitive, depression, and falls . Referrals and appointments  In addition, I have reviewed and discussed with patient certain preventive protocols, quality metrics, and best practice recommendations. A written personalized care plan for preventive services as well as general preventive health recommendations were provided to  patient.     Sheral Flow, LPN   08/21/4933   Nurse Notes:  Medications reviewed with patient; no opioid use noted.

## 2020-09-24 ENCOUNTER — Telehealth: Payer: Self-pay | Admitting: Pharmacist

## 2020-09-24 DIAGNOSIS — G4709 Other insomnia: Secondary | ICD-10-CM

## 2020-09-24 MED ORDER — TRAZODONE HCL 100 MG PO TABS: 100.0000 mg | ORAL_TABLET | Freq: Every day | ORAL | 0 refills | Status: DC

## 2020-09-24 NOTE — Telephone Encounter (Signed)
[facility] calling to request refill for trazodone 100 mg.  Refill sent.

## 2020-10-08 ENCOUNTER — Other Ambulatory Visit: Payer: Self-pay | Admitting: Internal Medicine

## 2020-10-31 ENCOUNTER — Other Ambulatory Visit: Payer: Self-pay | Admitting: Internal Medicine

## 2020-10-31 DIAGNOSIS — G4709 Other insomnia: Secondary | ICD-10-CM

## 2020-11-01 ENCOUNTER — Telehealth: Payer: Self-pay | Admitting: Pharmacist

## 2020-11-01 DIAGNOSIS — K219 Gastro-esophageal reflux disease without esophagitis: Secondary | ICD-10-CM

## 2020-11-01 MED ORDER — FAMOTIDINE 40 MG PO TABS: 40.0000 mg | ORAL_TABLET | Freq: Every day | ORAL | 1 refills | Status: DC

## 2020-11-01 NOTE — Telephone Encounter (Signed)
[facility] calling to request refill for famotidine 40 mg.  Refill sent.

## 2020-11-05 ENCOUNTER — Other Ambulatory Visit: Payer: Self-pay | Admitting: Internal Medicine

## 2020-11-09 DIAGNOSIS — L820 Inflamed seborrheic keratosis: Secondary | ICD-10-CM | POA: Diagnosis not present

## 2020-11-09 DIAGNOSIS — Z86006 Personal history of melanoma in-situ: Secondary | ICD-10-CM | POA: Diagnosis not present

## 2020-11-09 DIAGNOSIS — L821 Other seborrheic keratosis: Secondary | ICD-10-CM | POA: Diagnosis not present

## 2020-11-09 DIAGNOSIS — Z1283 Encounter for screening for malignant neoplasm of skin: Secondary | ICD-10-CM | POA: Diagnosis not present

## 2020-11-09 DIAGNOSIS — D225 Melanocytic nevi of trunk: Secondary | ICD-10-CM | POA: Diagnosis not present

## 2020-11-09 DIAGNOSIS — Z08 Encounter for follow-up examination after completed treatment for malignant neoplasm: Secondary | ICD-10-CM | POA: Diagnosis not present

## 2020-11-09 DIAGNOSIS — D485 Neoplasm of uncertain behavior of skin: Secondary | ICD-10-CM | POA: Diagnosis not present

## 2020-11-10 NOTE — Progress Notes (Signed)
Subjective:    Patient ID: Susan Gillespie, female    DOB: Jan 07, 1946, 75 y.o.   MRN: [id number]   This visit occurred during the SARS-CoV-2 public health emergency.  Safety protocols were in place, including screening questions prior to the visit, additional usage of staff PPE, and extensive cleaning of exam room while observing appropriate contact time as indicated for disinfecting solutions.    HPI She is here for a physical exam.   She is feeling her sleep is not as good.  She does need to take the alprazolam more and it does not seem to be as effective.  Medications and allergies reviewed with patient and updated if appropriate.  Patient Active Problem List   Diagnosis Date Noted   Lower back pain 11/11/2019   Bladder spasms 10/09/2017   Migraine without status migrainosus, not intractable 10/09/2017   Spondylosis of cervical region without myelopathy or radiculopathy 10/09/2017   Insomnia 10/09/2017   IBS (irritable bowel syndrome) 10/09/2017   Chronic diastolic CHF (congestive heart failure), NYHA class 1 ([place]) 02/20/2017   CAD in native artery 11/10/2016   Carotid stenosis, bilateral 08/31/2016   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HCC) 01/05/2016   Pernicious anemia 09/15/2013   B12 deficiency anemia 09/15/2013   Situational anxiety 12/20/2012   Spinal stenosis of lumbar region 07/05/2012   Cerebral infarction ([place]) 06/28/2012   Bleeding tendency ([place]) 05/16/2012   Paroxysmal supraventricular tachycardia ([place]) 09/25/2011   Essential hypertension, benign 08/22/2010   Prediabetes 04/28/2009   Hyperlipidemia LDL goal <70 12/16/2008   GERD 12/16/2008   Osteoarthritis 12/16/2008    Current Outpatient Medications on File Prior to Visit  Medication Sig Dispense Refill   acetaminophen (TYLENOL) 650 MG CR tablet Take 1,300 mg by mouth every 8 (eight) hours as needed for pain.      ALPRAZolam (XANAX) 0.5 MG tablet TAKE 0.5 TABLETS (0.25 MG TOTAL) BY MOUTH AT BEDTIME AS NEEDED  FOR ANXIETY OR SLEEP. 15 tablet 0   aspirin 325 MG tablet Take 1 tablet (325 mg total) by mouth daily. 30 tablet 0   cetirizine (ZYRTEC) 10 MG tablet Take 10 mg by mouth every morning.     clopidogrel (PLAVIX) 75 MG tablet TAKE 1 TABLET BY MOUTH EVERY DAY 90 tablet 1   diltiazem (CARDIZEM CD) 180 MG 24 hr capsule Take 1 capsule (180 mg total) by mouth at bedtime. 180 capsule 3   famotidine (PEPCID) 40 MG tablet Take 1 tablet (40 mg total) by mouth daily. 90 tablet 1   ferrous sulfate 325 (65 FE) MG tablet Take 325 mg by mouth daily with breakfast.     fluticasone (FLONASE) 50 MCG/ACT nasal spray Place into both nostrils daily.     furosemide (LASIX) 40 MG tablet Take 1/2 tablet by mouth daily. May take additional 1/2 tablet as needed for Edema. 90 tablet 3   losartan (COZAAR) 50 MG tablet Take 1 tablet (50 mg total) by mouth daily. 90 tablet 0   metoprolol succinate (TOPROL-XL) 50 MG 24 hr tablet Take 50 mg by mouth daily. Take with or immediately following a meal.     mirabegron ER (MYRBETRIQ) 25 MG TB24 tablet Take 1 tablet (25 mg total) by mouth daily. 90 tablet 0   potassium chloride (KLOR-CON) 10 MEQ tablet Take 1 tablet (10 mEq total) by mouth daily. 90 tablet 0   PREMARIN vaginal cream 1 APPLICATOR FULL INTO VAGINA TWICE A WEEK 90 g 4   rosuvastatin (CRESTOR) 20 MG tablet Take  1 tablet (20 mg total) by mouth daily. 90 tablet 0   SUMAtriptan (IMITREX) 50 MG tablet Take 50 mg by mouth every 2 (two) hours as needed for migraine. May repeat in 2 hours if headache persists or recurs.     traZODone (DESYREL) 100 MG tablet TAKE 1 TABLET BY MOUTH EVERYDAY AT BEDTIME 90 tablet 1   vitamin B-12 (CYANOCOBALAMIN) 500 MCG tablet Take 500 mcg by mouth daily.     nitroGLYCERIN (NITROSTAT) 0.4 MG SL tablet Place 1 tablet (0.4 mg total) under the tongue every 5 (five) minutes as needed for chest pain. 25 tablet 3   No current facility-administered medications on file prior to visit.    Past Medical  History:  Diagnosis Date   Anxiety    Artery stenosis (HCC)    left internal carotid, left subclavian   Arthritis    Bleeding tendency ([place]) 05/16/2012   Hx post op bleeding (epidural hematoma s/p ACDF '10; LLQ hematoma due to superficial vessel fascial bleed post colon resection; Normal platelet count; saw Dr. [name] '14   CAD in native artery    a. mod to mod-severe CAD by cath 10/2016, med rx.   Carotid stenosis, bilateral 08/31/2016   1-39% right and 50-69% lefft ICA stenosis dopplers 04/2019   Chronic diastolic CHF (congestive heart failure) ([place])    Colon polyp    Diverticulitis    Family history of cardiovascular disease    Fracture of right fibula    GERD (gastroesophageal reflux disease)    Headache(784.0)    mirgraines - history of   Hearing aid worn    B/L   HOH (hard of hearing)    wears hearing aids   Hyperlipidemia    Hypertension    IBS (irritable bowel syndrome)    followed by Dr. [name]   Liver cyst    Melanoma ([place])    removed from back 04/2013   Shortness of breath dyspnea    with exertion   Stroke ([place]) 06/2012   mild   SVT (supraventricular tachycardia) (HCC)    Urinary urgency    UTI (lower urinary tract infection)    x 2 since June 2016   Wears glasses     Past Surgical History:  Procedure Laterality Date   ABDOMINAL HYSTERECTOMY     ANGIOPLASTY     stent in subclavian LEFT 2016, angioplasty of stent 2017   APPENDECTOMY     AUGMENTATION MAMMAPLASTY     BREAST EXCISIONAL BIOPSY Left    benign   BREAST SURGERY     BIL breast augmentation   CHOLECYSTECTOMY     COLON SURGERY     Sigmoid Colectomy, returned for post op bleeding   COLONOSCOPY W/ POLYPECTOMY     COLONOSCOPY WITH PROPOFOL N/A 05/05/2016   Procedure: COLONOSCOPY WITH PROPOFOL;  Surgeon: Alphonsa Overall, MD;  Location: [facility];  Service: General;  Laterality: N/A;   ESOPHAGOGASTRODUODENOSCOPY (EGD) WITH PROPOFOL N/A 05/05/2016   Procedure: ESOPHAGOGASTRODUODENOSCOPY (EGD) WITH  PROPOFOL;  Surgeon: Alphonsa Overall, MD;  Location: [facility];  Service: General;  Laterality: N/A;   EYE SURGERY     eyelid drooping fixed   IR ANGIO INTRA EXTRACRAN SEL COM CAROTID INNOMINATE BILAT MOD SED  10/16/2018   IR ANGIO VERTEBRAL SEL SUBCLAVIAN INNOMINATE UNI L MOD SED  10/16/2018   IR ANGIO VERTEBRAL SEL VERTEBRAL UNI R MOD SED  10/16/2018   IR GENERIC HISTORICAL  12/24/2015   IR ANGIO INTRA EXTRACRAN SEL COM CAROTID INNOMINATE BILAT  MOD SED 12/24/2015 Luanne Bras, MD [facility]   IR GENERIC HISTORICAL  12/24/2015   IR ANGIOGRAM EXTREMITY LEFT 12/24/2015 Luanne Bras, MD [facility]   IR GENERIC HISTORICAL  12/24/2015   IR ANGIO VERTEBRAL SEL VERTEBRAL UNI R MOD SED 12/24/2015 Luanne Bras, MD [facility]   IR GENERIC HISTORICAL  01/05/2016   IR PTA NON CORO-LOWER EXTREM 01/05/2016 Luanne Bras, MD [facility]   IR GENERIC HISTORICAL  03/07/2016   IR RADIOLOGIST EVAL & MGMT 03/07/2016 [facility]   JOINT REPLACEMENT Left    thumb   LEFT HEART CATH AND CORONARY ANGIOGRAPHY N/A 11/10/2016   Procedure: Left Heart Cath and Coronary Angiography;  Surgeon: Belva Crome, MD;  Location: [facility];  Service: Cardiovascular;  Laterality: N/A;   MASTOID DEBRIDEMENT     MASTOIDECTOMY REVISION     RADIOLOGY WITH ANESTHESIA N/A 11/09/2014   Procedure: STENT PLACEMENT;  Surgeon: Luanne Bras, MD;  Location: [facility];  Service: Radiology;  Laterality: N/A;   RADIOLOGY WITH ANESTHESIA N/A 05/31/2015   Procedure: RADIOLOGY WITH ANESTHESIA;  Surgeon: Luanne Bras, MD;  Location: [facility];  Service: Radiology;  Laterality: N/A;   RADIOLOGY WITH ANESTHESIA N/A 01/05/2016   Procedure: RADIOLOGY WITH ANESTHESIA ANGIOPLASTY WITH STENTNG;  Surgeon: Luanne Bras, MD;  Location: [facility];  Service: Radiology;  Laterality: N/A;   RADIOLOGY WITH ANESTHESIA N/A 10/16/2018   Procedure: STENTING;  Surgeon: Luanne Bras, MD;  Location: [facility];  Service: Radiology;  Laterality:  N/A;   skin cancer excised  04/2013   Melonoma   SPINE SURGERY     cervical fusion, returned to OR for post op  bleeding   TONSILLECTOMY     TONSILLECTOMY     TUBAL LIGATION     VAGINA SURGERY     anterior posterior repair    Social History   Socioeconomic History   Marital status: Divorced    Spouse name: Not on file   Number of children: 2   Years of education: 14   Highest education level: Not on file  Occupational History   Occupation: Optician, dispensing: [employer]    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[employer], retired  Tobacco Use   Smoking status: Former    Packs/day: 2.00    Years: 22.00    Pack years: 44.00    Types: Cigarettes    Quit date: 04/23/1985    Years since quitting: 35.5   Smokeless tobacco: Never  Vaping Use   Vaping Use: Never used  Substance and Sexual Activity   Alcohol use: Yes    Alcohol/week: 4.0 standard drinks    Types: 4 Glasses of wine per week    Comment: 1 drink a  day   Drug use: No   Sexual activity: Yes    Birth control/protection: Surgical  Other Topics Concern   Not on file  Social History Narrative   ** Merged History Encounter **       Domestic partner   Regular exercise-no   Right handed   Caffeine use-- 2 cups coffee daily, rare soda   Social Determinants of Health   Financial Resource Strain: Low Risk    Difficulty of Paying Living Expenses: Not hard at all  Food Insecurity: No Food Insecurity   Worried About Charity fundraiser in the Last Year: Never true   Ran Out of Food in the Last Year: Never true  Transportation Needs: No Transportation Needs   Lack  of Transportation (Medical): No   Lack of Transportation (Non-Medical): No  Physical Activity: Sufficiently Active   Days of Exercise per Week: 5 days   Minutes of Exercise per Session: 30 min  Stress: No Stress Concern Present   Feeling of Stress : Not at all  Social Connections: Moderately Integrated   Frequency of Communication with Friends and Family:  More than three times a week   Frequency of Social Gatherings with Friends and Family: Twice a week   Attends Religious Services: More than 4 times per year   Active Member of [employer] or Organizations: No   Attends Music therapist: More than 4 times per year   Marital Status: Widowed    Family History  Problem Relation Age of Onset   Hyperlipidemia Other    Hypertension Other    Cancer Other        lung, esophagus, stomach   Stroke Other    Heart disease Father    Heart disease Sister    Heart attack Sister    Heart disease Brother    Heart attack Brother    Stroke Son     Review of Systems  Constitutional:  Negative for chills and fever.  Eyes:  Negative for visual disturbance.  Respiratory:  Negative for cough, shortness of breath and wheezing.   Cardiovascular:  Positive for leg swelling (controlled). Negative for chest pain and palpitations.  Gastrointestinal:  Negative for abdominal pain, blood in stool, constipation, diarrhea and nausea.       Occ gerd  Genitourinary:  Negative for dysuria.  Musculoskeletal:  Positive for back pain (first thing in the morning). Negative for arthralgias.  Skin:  Negative for color change and rash.  Neurological:  Positive for dizziness (occ). Negative for light-headedness and headaches.  Psychiatric/Behavioral:  Positive for sleep disturbance. Negative for dysphoric mood. The patient is nervous/anxious (situational).       Objective:   Vitals:   11/11/20 0843  BP: 112/68  Pulse: 70  Temp: 98.1 F (36.7 C)  SpO2: 97%   Filed Weights   11/11/20 0843  Weight: 154 lb (69.9 kg)   Body mass index is 27.28 kg/m.  BP Readings from Last 3 Encounters:  11/11/20 112/68  09/17/20 120/60  06/01/20 (!) 132/54    Wt Readings from Last 3 Encounters:  11/11/20 154 lb (69.9 kg)  09/17/20 157 lb (71.2 kg)  06/01/20 153 lb 12.8 oz (69.8 kg)    Depression screen [facility] 2/9 11/11/2020 09/17/2020 05/13/2020 11/11/2019 07/29/2019   Decreased Interest 0 0 0 0 0  Down, Depressed, Hopeless 0 0 0 0 0  PHQ - 2 Score 0 0 0 0 0  Altered sleeping 2 - 0 0 -  Tired, decreased energy 0 - 0 0 -  Change in appetite 0 - 0 0 -  Feeling bad or failure about yourself  0 - 0 0 -  Trouble concentrating - - 0 0 -  Moving slowly or fidgety/restless 0 - 0 0 -  Suicidal thoughts 0 - 0 0 -  PHQ-9 Score 2 - 0 0 -  Difficult doing work/chores Not difficult at all - Not difficult at all - -  Some recent data might be hidden    GAD 7 : Generalized Anxiety Score 11/11/2020  Nervous, Anxious, on Edge 0  Control/stop worrying 0  Worry too much - different things 1  Trouble relaxing 0  Restless 0  Easily annoyed or irritable 0  Afraid - awful  might happen 0  Total GAD 7 Score 1  Anxiety Difficulty Not difficult at all        Physical Exam Constitutional: She appears well-developed and well-nourished. No distress.  HENT:  Head: Normocephalic and atraumatic.  Right Ear: External ear normal. Normal ear canal and TM Left Ear: External ear normal.  Normal ear canal and TM Mouth/Throat: Oropharynx is clear and moist.  Eyes: Conjunctivae and EOM are normal.  Neck: Neck supple. No tracheal deviation present. No thyromegaly present.  Left  carotid bruit  Cardiovascular: Normal rate, regular rhythm and normal heart sounds.   No murmur heard.  No edema. Pulmonary/Chest: Effort normal and breath sounds normal. No respiratory distress. She has no wheezes. She has no rales.  Breast: deferred   Abdominal: Soft. She exhibits no distension. There is no tenderness.  Lymphadenopathy: She has no cervical adenopathy.  Skin: Skin is warm and dry. She is not diaphoretic.  Psychiatric: She has a normal mood and affect. Her behavior is normal.        Assessment & Plan:   Physical exam: Screening blood work  ordered Exercise  walks a lot Weight  ok - encouraged regular exercise and healthy diet Substance abuse  none   Screened for  depression using the PHQ 9 scale.  No evidence of depression.   Screened for anxiety using GAD7 Scale.  She has minimal anxiety-typically situational and takes Xanax as needed.  Overall no generalized or concerning anxiety.   Health Maintenance  Topic Date Due   TETANUS/TDAP  11/11/2021 (Originally 04/17/2017)   Hepatitis C Screening  04/05/2029 (Originally 04/08/1964)   INFLUENZA VACCINE  11/15/2020   DEXA SCAN  01/13/2024   COVID-19 Vaccine  Completed   PNA vac Low Risk Adult  Completed   Zoster Vaccines- Shingrix  Completed   HPV VACCINES  Aged Out          See Problem List for Assessment and Plan of chronic medical problems.

## 2020-11-10 NOTE — Patient Instructions (Addendum)
Blood work was ordered.     Medications changes include :   none  Your prescription(s) have been submitted to your pharmacy. Please take as directed and contact our office if you believe you are having problem(s) with the medication(s).    Please followup in 6 months    Health Maintenance, Female Adopting a healthy lifestyle and getting preventive care are important in promoting health and wellness. Ask your health care provider about: The right schedule for you to have regular tests and exams. Things you can do on your own to prevent diseases and keep yourself healthy. What should I know about diet, weight, and exercise? Eat a healthy diet  Eat a diet that includes plenty of vegetables, fruits, low-fat dairy products, and lean protein. Do not eat a lot of foods that are high in solid fats, added sugars, or sodium.  Maintain a healthy weight Body mass index (BMI) is used to identify weight problems. It estimates body fat based on height and weight. Your health care provider can help determineyour BMI and help you achieve or maintain a healthy weight. Get regular exercise Get regular exercise. This is one of the most important things you can do for your health. Most adults should: Exercise for at least 150 minutes each week. The exercise should increase your heart rate and make you sweat (moderate-intensity exercise). Do strengthening exercises at least twice a week. This is in addition to the moderate-intensity exercise. Spend less time sitting. Even light physical activity can be beneficial. Watch cholesterol and blood lipids Have your blood tested for lipids and cholesterol at 77 years of age, then havethis test every 5 years. Have your cholesterol levels checked more often if: Your lipid or cholesterol levels are high. You are older than 77 years of age. You are at high risk for heart disease. What should I know about cancer screening? Depending on your health history and  family history, you may need to have cancer screening at various ages. This may include screening for: Breast cancer. Cervical cancer. Colorectal cancer. Skin cancer. Lung cancer. What should I know about heart disease, diabetes, and high blood pressure? Blood pressure and heart disease High blood pressure causes heart disease and increases the risk of stroke. This is more likely to develop in people who have high blood pressure readings, are of African descent, or are overweight. Have your blood pressure checked: Every 3-5 years if you are 77-53 years of age. Every year if you are 77 years old or older. Diabetes Have regular diabetes screenings. This checks your fasting blood sugar level. Have the screening done: Once every three years after age 77 if you are at a normal weight and have a low risk for diabetes. More often and at a younger age if you are overweight or have a high risk for diabetes. What should I know about preventing infection? Hepatitis B If you have a higher risk for hepatitis B, you should be screened for this virus. Talk with your health care provider to find out if you are at risk forhepatitis B infection. Hepatitis C Testing is recommended for: Everyone born from [birth year] through 1967. Anyone with known risk factors for hepatitis C. Sexually transmitted infections (STIs) Get screened for STIs, including gonorrhea and chlamydia, if: You are sexually active and are younger than 77 years of age. You are older than 77 years of age and your health care provider tells you that you are at risk for this type of infection. Your sexual  activity has changed since you were last screened, and you are at increased risk for chlamydia or gonorrhea. Ask your health care provider if you are at risk. Ask your health care provider about whether you are at high risk for HIV. Your health care provider may recommend a prescription medicine to help prevent HIV infection. If you choose to take  medicine to prevent HIV, you should first get tested for HIV. You should then be tested every 3 months for as long as you are taking the medicine. Pregnancy If you are about to stop having your period (premenopausal) and you may become pregnant, seek counseling before you get pregnant. Take 400 to 800 micrograms (mcg) of folic acid every day if you become pregnant. Ask for birth control (contraception) if you want to prevent pregnancy. Osteoporosis and menopause Osteoporosis is a disease in which the bones lose minerals and strength with aging. This can result in bone fractures. If you are 56 years old or older, or if you are at risk for osteoporosis and fractures, ask your health care provider if you should: Be screened for bone loss. Take a calcium or vitamin D supplement to lower your risk of fractures. Be given hormone replacement therapy (HRT) to treat symptoms of menopause. Follow these instructions at home: Lifestyle Do not use any products that contain nicotine or tobacco, such as cigarettes, e-cigarettes, and chewing tobacco. If you need help quitting, ask your health care provider. Do not use street drugs. Do not share needles. Ask your health care provider for help if you need support or information about quitting drugs. Alcohol use Do not drink alcohol if: Your health care provider tells you not to drink. You are pregnant, may be pregnant, or are planning to become pregnant. If you drink alcohol: Limit how much you use to 0-1 drink a day. Limit intake if you are breastfeeding. Be aware of how much alcohol is in your drink. In the [place] one drink equals one 12 oz bottle of beer (355 mL), one 5 oz glass of wine (148 mL), or one 1 oz glass of hard liquor (44 mL). General instructions Schedule regular health, dental, and eye exams. Stay current with your vaccines. Tell your health care provider if: You often feel depressed. You have ever been abused or do not feel safe at  home. Summary Adopting a healthy lifestyle and getting preventive care are important in promoting health and wellness. Follow your health care provider's instructions about healthy diet, exercising, and getting tested or screened for diseases. Follow your health care provider's instructions on monitoring your cholesterol and blood pressure. This information is not intended to replace advice given to you by your health care provider. Make sure you discuss any questions you have with your healthcare provider. Document Revised: 03/27/2018 Document Reviewed: 03/27/2018 Elsevier Patient Education  2022 [employer].

## 2020-11-11 ENCOUNTER — Other Ambulatory Visit: Payer: Self-pay

## 2020-11-11 ENCOUNTER — Ambulatory Visit (INDEPENDENT_AMBULATORY_CARE_PROVIDER_SITE_OTHER): Payer: PPO | Admitting: Internal Medicine

## 2020-11-11 ENCOUNTER — Encounter: Payer: Self-pay | Admitting: Internal Medicine

## 2020-11-11 VITALS — BP 112/68 | HR 70 | Temp 98.1°F | Ht 63.0 in | Wt 154.0 lb

## 2020-11-11 DIAGNOSIS — R7303 Prediabetes: Secondary | ICD-10-CM | POA: Diagnosis not present

## 2020-11-11 DIAGNOSIS — I2510 Atherosclerotic heart disease of native coronary artery without angina pectoris: Secondary | ICD-10-CM

## 2020-11-11 DIAGNOSIS — Z1331 Encounter for screening for depression: Secondary | ICD-10-CM | POA: Diagnosis not present

## 2020-11-11 DIAGNOSIS — K588 Other irritable bowel syndrome: Secondary | ICD-10-CM

## 2020-11-11 DIAGNOSIS — I5032 Chronic diastolic (congestive) heart failure: Secondary | ICD-10-CM

## 2020-11-11 DIAGNOSIS — Z1159 Encounter for screening for other viral diseases: Secondary | ICD-10-CM | POA: Diagnosis not present

## 2020-11-11 DIAGNOSIS — F418 Other specified anxiety disorders: Secondary | ICD-10-CM

## 2020-11-11 DIAGNOSIS — I700 Atherosclerosis of aorta: Secondary | ICD-10-CM | POA: Insufficient documentation

## 2020-11-11 DIAGNOSIS — N3289 Other specified disorders of bladder: Secondary | ICD-10-CM

## 2020-11-11 DIAGNOSIS — G4709 Other insomnia: Secondary | ICD-10-CM

## 2020-11-11 DIAGNOSIS — E785 Hyperlipidemia, unspecified: Secondary | ICD-10-CM

## 2020-11-11 DIAGNOSIS — D519 Vitamin B12 deficiency anemia, unspecified: Secondary | ICD-10-CM

## 2020-11-11 DIAGNOSIS — I10 Essential (primary) hypertension: Secondary | ICD-10-CM | POA: Diagnosis not present

## 2020-11-11 DIAGNOSIS — Z0000 Encounter for general adult medical examination without abnormal findings: Secondary | ICD-10-CM

## 2020-11-11 DIAGNOSIS — M8949 Other hypertrophic osteoarthropathy, multiple sites: Secondary | ICD-10-CM

## 2020-11-11 LAB — CBC WITH DIFFERENTIAL/PLATELET
Basophils Absolute: 0 10*3/uL (ref 0.0–0.1)
Basophils Relative: 0.6 % (ref 0.0–3.0)
Eosinophils Absolute: 0.3 10*3/uL (ref 0.0–0.7)
Eosinophils Relative: 6.6 % — ABNORMAL HIGH (ref 0.0–5.0)
HCT: 36.2 % (ref 36.0–46.0)
Hemoglobin: 12 g/dL (ref 12.0–15.0)
Lymphocytes Relative: 20.5 % (ref 12.0–46.0)
Lymphs Abs: 1 10*3/uL (ref 0.7–4.0)
MCHC: 33.2 g/dL (ref 30.0–36.0)
MCV: 100.1 fl — ABNORMAL HIGH (ref 78.0–100.0)
Monocytes Absolute: 0.5 10*3/uL (ref 0.1–1.0)
Monocytes Relative: 9.9 % (ref 3.0–12.0)
Neutro Abs: 3 10*3/uL (ref 1.4–7.7)
Neutrophils Relative %: 62.4 % (ref 43.0–77.0)
Platelets: 243 10*3/uL (ref 150.0–400.0)
RBC: 3.61 Mil/uL — ABNORMAL LOW (ref 3.87–5.11)
RDW: 13 % (ref 11.5–15.5)
WBC: 4.8 10*3/uL (ref 4.0–10.5)

## 2020-11-11 LAB — HEMOGLOBIN A1C: Hgb A1c MFr Bld: 6 % (ref 4.6–6.5)

## 2020-11-11 LAB — LIPID PANEL
Cholesterol: 176 mg/dL (ref 0–200)
HDL: 80.2 mg/dL (ref >39.00)
LDL Cholesterol: 58 mg/dL (ref 0–99)
NonHDL: 95.61
Total CHOL/HDL Ratio: 2
Triglycerides: 189 mg/dL — ABNORMAL HIGH (ref 0.0–149.0)
VLDL: 37.8 mg/dL (ref 0.0–40.0)

## 2020-11-11 LAB — COMPREHENSIVE METABOLIC PANEL
ALT: 13 U/L (ref 0–35)
AST: 16 U/L (ref 0–37)
Albumin: 4.1 g/dL (ref 3.5–5.2)
Alkaline Phosphatase: 55 U/L (ref 39–117)
BUN: 18 mg/dL (ref 6–23)
CO2: 30 mEq/L (ref 19–32)
Calcium: 9.9 mg/dL (ref 8.4–10.5)
Chloride: 103 mEq/L (ref 96–112)
Creatinine, Ser: 1.03 mg/dL (ref 0.40–1.20)
GFR: 52.78 mL/min — ABNORMAL LOW (ref >60.00)
Glucose, Bld: 110 mg/dL — ABNORMAL HIGH (ref 70–99)
Potassium: 4.3 mEq/L (ref 3.5–5.1)
Sodium: 140 mEq/L (ref 135–145)
Total Bilirubin: 0.3 mg/dL (ref 0.2–1.2)
Total Protein: 6.4 g/dL (ref 6.0–8.3)

## 2020-11-11 LAB — TSH: TSH: 1.92 u[IU]/mL (ref 0.35–5.50)

## 2020-11-11 LAB — VITAMIN B12: Vitamin B-12: 731 pg/mL (ref 211–911)

## 2020-11-11 MED ORDER — METHOCARBAMOL 500 MG PO TABS: 500.0000 mg | ORAL_TABLET | Freq: Every day | ORAL | 2 refills | Status: DC | PRN

## 2020-11-11 NOTE — Assessment & Plan Note (Signed)
Chronic BP well controlled Continue cardizem 180 mg qd, lasix 20 mg qd, losartan 50 mg, metoprolol xl 50 mg qd cmp

## 2020-11-11 NOTE — Assessment & Plan Note (Signed)
Chronic Euvolemic Following with cardiology Continue current medications 

## 2020-11-11 NOTE — Assessment & Plan Note (Signed)
Chronic Continue rosuvastatin 20 mg daily Continue heart healthy diet and regular exercise

## 2020-11-11 NOTE — Assessment & Plan Note (Addendum)
Chronic Following with cardiology No symptoms suggestive of angina Continue current medications

## 2020-11-11 NOTE — Assessment & Plan Note (Signed)
Chronic Continue alprazolam 0.5 mg daily as needed

## 2020-11-11 NOTE — Assessment & Plan Note (Signed)
Chronic Check lipid panel  Continue Crestor 20 mg daily Regular exercise and healthy diet encouraged  

## 2020-11-11 NOTE — Assessment & Plan Note (Signed)
Chronic Somewhat controlled Continue trazodone 100 mg daily and alprazolam 0.5 mg nightly as needed-discussed concerns with long-term alprazolam use Encouraged regular exercise and other ways of helping with sleep

## 2020-11-11 NOTE — Assessment & Plan Note (Signed)
Chronic She has decreased her vitamin B12 dose Will check vitamin B12 level

## 2020-11-11 NOTE — Assessment & Plan Note (Signed)
Chronic Check a1c Low sugar / carb diet Stressed regular exercise  

## 2020-11-11 NOTE — Assessment & Plan Note (Signed)
Chronic Takes Imodium as needed Controlled

## 2020-11-11 NOTE — Assessment & Plan Note (Addendum)
Chronic Using premarin cream Taking myrbetriq - only taking 1/2 pill due to cost Discussed other options-at this point she does not want to try any other medications Will see if there is a way to get her tier reduction or reduced cost for medication

## 2020-11-12 LAB — HEPATITIS C ANTIBODY
Hepatitis C Ab: NONREACTIVE
SIGNAL TO CUT-OFF: 0.02 (ref <1.00)

## 2020-11-16 ENCOUNTER — Other Ambulatory Visit: Payer: Self-pay | Admitting: Internal Medicine

## 2020-11-16 DIAGNOSIS — I10 Essential (primary) hypertension: Secondary | ICD-10-CM

## 2020-11-16 MED ORDER — LOSARTAN POTASSIUM 50 MG PO TABS: 50.0000 mg | ORAL_TABLET | Freq: Every day | ORAL | 3 refills | Status: DC

## 2021-01-04 ENCOUNTER — Other Ambulatory Visit: Payer: Self-pay | Admitting: Internal Medicine

## 2021-01-04 DIAGNOSIS — N3289 Other specified disorders of bladder: Secondary | ICD-10-CM

## 2021-01-04 DIAGNOSIS — I5032 Chronic diastolic (congestive) heart failure: Secondary | ICD-10-CM

## 2021-01-04 DIAGNOSIS — E785 Hyperlipidemia, unspecified: Secondary | ICD-10-CM

## 2021-01-11 DIAGNOSIS — H02002 Unspecified entropion of right lower eyelid: Secondary | ICD-10-CM | POA: Diagnosis not present

## 2021-01-11 DIAGNOSIS — H353112 Nonexudative age-related macular degeneration, right eye, intermediate dry stage: Secondary | ICD-10-CM | POA: Diagnosis not present

## 2021-01-11 DIAGNOSIS — H353121 Nonexudative age-related macular degeneration, left eye, early dry stage: Secondary | ICD-10-CM | POA: Diagnosis not present

## 2021-01-14 ENCOUNTER — Other Ambulatory Visit: Payer: Self-pay | Admitting: Internal Medicine

## 2021-01-14 DIAGNOSIS — Z1231 Encounter for screening mammogram for malignant neoplasm of breast: Secondary | ICD-10-CM

## 2021-01-26 ENCOUNTER — Telehealth: Payer: Self-pay | Admitting: Pharmacist

## 2021-01-26 NOTE — Progress Notes (Signed)
    Chronic Care Management Pharmacy Assistant   Name: MARLEA GAMBILL  MRN: [id number] DOB: 25-Mar-1946  Called patient and left message that her appointment on 02/09/21 with clinical pharmacist Charlene Brooke has been rescheduled to 02/21/21 @ 9:45 am. Left message for patient to return call if new date was not good for her.   [place] Pharmacist Assistant [phone]

## 2021-02-01 ENCOUNTER — Other Ambulatory Visit (HOSPITAL_COMMUNITY): Payer: Self-pay | Admitting: Interventional Radiology

## 2021-02-01 DIAGNOSIS — I771 Stricture of artery: Secondary | ICD-10-CM

## 2021-02-09 ENCOUNTER — Telehealth: Payer: PPO

## 2021-02-09 ENCOUNTER — Telehealth: Payer: Self-pay

## 2021-02-09 NOTE — Progress Notes (Signed)
    Chronic Care Management Pharmacy Assistant   Name: Susan Gillespie  MRN: [id number] DOB: 07-12-1945   Reviewed chart for medication changes and adherence. Patient is still taking Rosuvastatin 20 mg prescribed by Dr. Quay Burow filled at [facility].  No gaps in adherence identified.   [place] Pharmacist Assistant [phone]

## 2021-02-11 ENCOUNTER — Ambulatory Visit (HOSPITAL_COMMUNITY)
Admission: RE | Admit: 2021-02-11 | Discharge: 2021-02-11 | Disposition: A | Discharge status: Home / Self Care | Payer: PPO | Source: Ambulatory Visit | Attending: Interventional Radiology | Admitting: Interventional Radiology

## 2021-02-11 ENCOUNTER — Other Ambulatory Visit: Payer: Self-pay

## 2021-02-11 DIAGNOSIS — I771 Stricture of artery: Secondary | ICD-10-CM

## 2021-02-11 NOTE — Progress Notes (Incomplete)
{  Select Note:[id number]} 

## 2021-02-15 ENCOUNTER — Telehealth (HOSPITAL_COMMUNITY): Payer: Self-pay

## 2021-02-15 NOTE — Telephone Encounter (Signed)
Pt agreed to f/u in 6 months with us carotid. AW 

## 2021-02-16 ENCOUNTER — Ambulatory Visit: Payer: PPO

## 2021-02-21 ENCOUNTER — Other Ambulatory Visit: Payer: Self-pay

## 2021-02-21 ENCOUNTER — Ambulatory Visit (INDEPENDENT_AMBULATORY_CARE_PROVIDER_SITE_OTHER): Payer: PPO | Admitting: Pharmacist

## 2021-02-21 DIAGNOSIS — I5032 Chronic diastolic (congestive) heart failure: Secondary | ICD-10-CM

## 2021-02-21 DIAGNOSIS — N3289 Other specified disorders of bladder: Secondary | ICD-10-CM

## 2021-02-21 DIAGNOSIS — I10 Essential (primary) hypertension: Secondary | ICD-10-CM

## 2021-02-21 DIAGNOSIS — E785 Hyperlipidemia, unspecified: Secondary | ICD-10-CM

## 2021-02-21 DIAGNOSIS — I2510 Atherosclerotic heart disease of native coronary artery without angina pectoris: Secondary | ICD-10-CM

## 2021-02-21 NOTE — Progress Notes (Signed)
Chronic Care Management Pharmacy Note  02/21/2021 Name:  Susan Gillespie MRN:  [id number] DOB:  06/17/1945  Summary: -Pt reports some improvement in OAB with Myrbetriq 25 mg - 1/2 tab daily, but bladder spasms still interfere with sleep. Pt is not sure if Premarin cream is doing anything  Recommendations/Changes made from today's visit: -Advised trial off Premarin cream for several weeks to assess efficacy; can d/c if no benefit -Advised to limit caffeine, alcohol, acidic foods to help with bladder spasms    Subjective: Susan Gillespie is an 75 y.o. year old female who is a primary patient of Burns, Claudina Lick, MD.  The CCM team was consulted for assistance with disease management and care coordination needs.    Engaged with patient by telephone for follow up visit in response to provider referral for pharmacy case management and/or care coordination services.   Consent to Services:  The patient was given information about Chronic Care Management services, agreed to services, and gave verbal consent prior to initiation of services.  Please see initial visit note for detailed documentation.   Patient Care Team: Susan Rail, MD as PCP - General (Internal Medicine) Sueanne Margarita, MD as PCP - Cardiology (Cardiology) Charlton Haws, [facility] as Pharmacist (Pharmacist)  Patient is from [place]. She lives alone in a condo. 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 She has 2 children living locally and 2 teenage grandchildren. She enjoys gardening and travelling with her boyfriend, they go to [place] a couple times a year.  Recent office visits: 11/11/20 Dr Quay Burow OV: CPE. Labs stable. No med changes. Encouraged exercise to help with insomnia.  05/13/20 Burns (PCP) - F/u Hypertension. Increase trazodone to 100 mg nightly. Follow up in 6 months. B12 level high, may reduce B12 to TIW.   Recent consult visits: PT/OT for right hand  pain/stiffness 06/01/20 Turner (Cardiology) - Seen for Coronary Artery Disease  Hypertension  Hyperlipidemia  Congestive Heart Failure. F/u in one year. 05/17/20 Wood (Optometry) - Foreign body in cornea, right eye.  Hospital visits: None in previous 6 months  Objective:  Lab Results  Component Value Date   CREATININE 1.03 11/11/2020   BUN 18 11/11/2020   GFR 52.78 (L) 11/11/2020   GFRNONAA 55 (L) 10/16/2018   GFRAA >60 10/16/2018   NA 140 11/11/2020   K 4.3 11/11/2020   CALCIUM 9.9 11/11/2020   CO2 30 11/11/2020   GLUCOSE 110 (H) 11/11/2020    Lab Results  Component Value Date/Time   HGBA1C 6.0 11/11/2020 09:34 AM   HGBA1C 5.9 05/13/2020 11:08 AM   GFR 52.78 (L) 11/11/2020 09:34 AM   GFR 55.55 (L) 05/13/2020 11:08 AM    Last diabetic Eye exam:  Lab Results  Component Value Date/Time   HMDIABEYEEXA normal by her report 01/03/2012 12:00 AM    Last diabetic Foot exam:  Lab Results  Component Value Date/Time   HMDIABFOOTEX done 09/19/2012 12:00 AM     Lab Results  Component Value Date   CHOL 176 11/11/2020   HDL 80.20 11/11/2020   LDLCALC 58 11/11/2020   LDLDIRECT 50.0 10/30/2018   TRIG 189.0 (H) 11/11/2020   CHOLHDL 2 11/11/2020    Hepatic Function Latest Ref Rng & Units 11/11/2020 05/13/2020 11/11/2019  Total Protein 6.0 - 8.3 g/dL 6.4 6.8 6.4  Albumin 3.5 - 5.2 g/dL 4.1 4.4 -  AST 0 - 37 U/L '16 18 15  ' ALT 0 -  35 U/L '13 30 13  ' Alk Phosphatase 39 - 117 U/L 55 62 -  Total Bilirubin 0.2 - 1.2 mg/dL 0.3 0.4 0.4  Bilirubin, Direct 0.00 - 0.40 mg/dL - - -    Lab Results  Component Value Date/Time   TSH 1.92 11/11/2020 09:34 AM   TSH 1.28 05/02/2019 10:39 AM   FREET4 0.82 02/05/2012 02:44 PM    CBC Latest Ref Rng & Units 11/11/2020 05/13/2020 11/11/2019  WBC 4.0 - 10.5 K/uL 4.8 4.5 5.4  Hemoglobin 12.0 - 15.0 g/dL 12.0 12.1 11.8  Hematocrit 36.0 - 46.0 % 36.2 36.3 35.0  Platelets 150.0 - 400.0 K/uL 243.0 263.0 262    No results found for: VD25OH  Clinical  ASCVD: Yes  The 10-year ASCVD risk score (Arnett DK, et al., 2019) is: 18.6%   Values used to calculate the score:     Age: 75 years     Sex: Female     Is Non-Hispanic African American: No     Diabetic: No     Tobacco smoker: No     Systolic Blood Pressure: 370 mmHg     Is BP treated: Yes     HDL Cholesterol: 80.2 mg/dL     Total Cholesterol: 176 mg/dL    Depression screen [facility] 2/9 11/11/2020 09/17/2020 05/13/2020  Decreased Interest 0 0 0  Down, Depressed, Hopeless 0 0 0  PHQ - 2 Score 0 0 0  Altered sleeping 2 - 0  Tired, decreased energy 0 - 0  Change in appetite 0 - 0  Feeling bad or failure about yourself  0 - 0  Trouble concentrating - - 0  Moving slowly or fidgety/restless 0 - 0  Suicidal thoughts 0 - 0  PHQ-9 Score 2 - 0  Difficult doing work/chores Not difficult at all - Not difficult at all  Some recent data might be hidden    GAD 7 : Generalized Anxiety Score 11/11/2020  Nervous, Anxious, on Edge 0  Control/stop worrying 0  Worry too much - different things 1  Trouble relaxing 0  Restless 0  Easily annoyed or irritable 0  Afraid - awful might happen 0  Total GAD 7 Score 1  Anxiety Difficulty Not difficult at all    Social History   Tobacco Use  Smoking Status Former   Packs/day: 2.00   Years: 22.00   Pack years: 44.00   Types: Cigarettes   Quit date: 04/23/1985   Years since quitting: 35.8  Smokeless Tobacco Never   BP Readings from Last 3 Encounters:  11/11/20 112/68  09/17/20 120/60  06/01/20 (!) 132/54   Pulse Readings from Last 3 Encounters:  11/11/20 70  09/17/20 (!) 52  06/01/20 66   Wt Readings from Last 3 Encounters:  11/11/20 154 lb (69.9 kg)  09/17/20 157 lb (71.2 kg)  06/01/20 153 lb 12.8 oz (69.8 kg)   BMI Readings from Last 3 Encounters:  11/11/20 27.28 kg/m  09/17/20 27.81 kg/m  06/01/20 27.03 kg/m    Assessment/Interventions: Review of patient past medical history, allergies, medications, health status, including review of  consultants reports, laboratory and other test data, was performed as part of comprehensive evaluation and provision of chronic care management services.   SDOH:  (Social Determinants of Health) assessments and interventions performed: Yes   SDOH Screenings   Alcohol Screen: Low Risk    Last Alcohol Screening Score (AUDIT): 2  Depression (PHQ2-9): Low Risk    PHQ-2 Score: 2  Financial Resource Strain: Low  Risk    Difficulty of Paying Living Expenses: Not hard at all  Food Insecurity: No Food Insecurity   Worried About Charity fundraiser in the Last Year: Never true   Ran Out of Food in the Last Year: Never true  Housing: Low Risk    Last Housing Risk Score: 0  Physical Activity: Sufficiently Active   Days of Exercise per Week: 5 days   Minutes of Exercise per Session: 30 min  Social Connections: Moderately Integrated   Frequency of Communication with Friends and Family: More than three times a week   Frequency of Social Gatherings with Friends and Family: Twice a week   Attends Religious Services: More than 4 times per year   Active Member of [employer] or Organizations: No   Attends Music therapist: More than 4 times per year   Marital Status: Widowed  Stress: No Stress Concern Present   Feeling of Stress : Not at all  Tobacco Use: Medium Risk   Smoking Tobacco Use: Former   Smokeless Tobacco Use: Never   Passive Exposure: Not on file  Transportation Needs: No Transportation Needs   Lack of Transportation (Medical): No   Lack of Transportation (Non-Medical): No    CCM Care Plan  Allergies  Allergen Reactions   Phenergan [Promethazine Hcl] Other (See Comments)    IV suppressed breathing    Daypro [Oxaprozin] Swelling    facial   Lisinopril     cough   Oxycodone Itching   Simvastatin     REACTION: myalgias and fatigue   Tramadol     Medications Reviewed Today     Reviewed by Susan Rail, MD (Physician) on 11/11/20 at 0905  Med List Status: <None>    Medication Order Taking? Sig Documenting Provider Last Dose Status Informant  acetaminophen (TYLENOL) 650 MG CR tablet [id number] Yes Take 1,300 mg by mouth every 8 (eight) hours as needed for pain.  [provider] Taking Active Self  ALPRAZolam Duanne Moron) 0.5 MG tablet [id number] Yes TAKE 0.5 TABLETS (0.25 MG TOTAL) BY MOUTH AT BEDTIME AS NEEDED FOR ANXIETY OR SLEEP. Susan Rail, MD Taking Active   aspirin 325 MG tablet [id number] Yes Take 1 tablet (325 mg total) by mouth daily. Hedy Jacob, PA-C Taking Active Self           Med Note [facility], GREG A   Tue May 09, 2016  7:19 PM)    cetirizine (ZYRTEC) 10 MG tablet [id number] Yes Take 10 mg by mouth every morning. [provider] Taking Active Self  clopidogrel (PLAVIX) 75 MG tablet [id number] Yes TAKE 1 TABLET BY MOUTH EVERY DAY Burns, Claudina Lick, MD Taking Active   diltiazem (CARDIZEM CD) 180 MG 24 hr capsule [id number] Yes Take 1 capsule (180 mg total) by mouth at bedtime. Sueanne Margarita, MD Taking Active   famotidine (PEPCID) 40 MG tablet [id number] Yes Take 1 tablet (40 mg total) by mouth daily. Susan Rail, MD Taking Active   ferrous sulfate 325 (65 FE) MG tablet [id number] Yes Take 325 mg by mouth daily with breakfast. [provider] Taking Active Self  fluticasone (FLONASE) 50 MCG/ACT nasal spray [id number] Yes Place into both nostrils daily. [provider] Taking Active   furosemide (LASIX) 40 MG tablet [id number] Yes Take 1/2 tablet by mouth daily. May take additional 1/2 tablet as needed for Edema. Sueanne Margarita, MD Taking Active   losartan (COZAAR) 50 MG tablet [id number] Yes Take 1 tablet (  50 mg total) by mouth daily. Susan Rail, MD Taking Active   methocarbamol (ROBAXIN) 500 MG tablet [id number] Yes Take by mouth. [provider] Taking Active   metoprolol succinate (TOPROL-XL) 50 MG 24 hr tablet [id number] Yes Take 50 mg by mouth daily. Take with or immediately following a meal. [provider] Taking Active   mirabegron ER (MYRBETRIQ) 25 MG TB24 tablet [id number] Yes Take 1 tablet (25 mg total) by mouth daily. Susan Rail, MD Taking Active   nitroGLYCERIN (NITROSTAT) 0.4 MG SL tablet [id number]  Place 1 tablet (0.4 mg total) under the tongue every 5 (five) minutes as needed for chest pain. Eileen Stanford, PA-C  Expired 10/10/18 2359 Self  potassium chloride (KLOR-CON) 10 MEQ tablet [id number] Yes Take 1 tablet (10 mEq total) by mouth daily. Susan Rail, MD Taking Active   PREMARIN vaginal cream [id number] Yes 1 APPLICATOR FULL INTO VAGINA TWICE A WEEK Burns, Claudina Lick, MD Taking Active   rosuvastatin (CRESTOR) 20 MG tablet [id number] Yes Take 1 tablet (20 mg total) by mouth daily. Susan Rail, MD Taking Active   SUMAtriptan (IMITREX) 50 MG tablet [id number] Yes Take 50 mg by mouth every 2 (two) hours as needed for migraine. May repeat in 2 hours if headache persists or recurs. [provider] Taking Active Self  traZODone (DESYREL) 100 MG tablet [id number] Yes TAKE 1 TABLET BY MOUTH EVERYDAY AT BEDTIME Janith Lima, MD Taking Active   vitamin B-12 (CYANOCOBALAMIN) 500 MCG tablet [id number] Yes Take 500 mcg by mouth daily. [provider] Taking Active Self            Patient Active Problem List   Diagnosis Date Noted   Aortic atherosclerosis ([place]) 11/11/2020   Lower back pain 11/11/2019   Bladder spasms 10/09/2017   Migraine without status migrainosus, not intractable 10/09/2017   Spondylosis of cervical region without myelopathy or radiculopathy 10/09/2017   Insomnia 10/09/2017   IBS (irritable bowel syndrome) 10/09/2017   Chronic diastolic CHF (congestive heart failure), NYHA class 1 ([place]) 02/20/2017   CAD in native artery 11/10/2016   Carotid stenosis, bilateral 08/31/2016   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HCC) 01/05/2016   Pernicious anemia 09/15/2013   B12 deficiency anemia 09/15/2013   Situational anxiety 12/20/2012   Spinal  stenosis of lumbar region 07/05/2012   Cerebral infarction ([place]) 06/28/2012   Bleeding tendency ([place]) 05/16/2012   Paroxysmal supraventricular tachycardia ([place]) 09/25/2011   Essential hypertension, benign 08/22/2010   Prediabetes 04/28/2009   Hyperlipidemia LDL goal <70 12/16/2008   GERD 12/16/2008   Osteoarthritis 12/16/2008    Immunization History  Administered Date(s) Administered   Influenza Whole 02/14/2012   Influenza, High Dose Seasonal PF 01/19/2017, 02/08/2018, 01/28/2020   Influenza, Quadrivalent, Recombinant, Inj, Pf 01/18/2019   Influenza,inj,Quad PF,6+ Mos 01/06/2016   Influenza,inj,quad, With Preservative 01/12/2014   Influenza-Unspecified 01/15/2013, 02/03/2015, 01/19/2017   PFIZER Comirnaty(Gray Top)Covid-19 Tri-Sucrose Vaccine 07/31/2020   PFIZER(Purple Top)SARS-COV-2 Vaccination 05/02/2019, 05/23/2019, 02/21/2020   Pfizer Covid-19 Vaccine Bivalent Booster 12yr & up 01/21/2021   Pneumococcal Conjugate-13 04/28/2009, 01/12/2014   Pneumococcal Polysaccharide-23 11/06/2018   Td 04/18/2007   Zoster Recombinat (Shingrix) 01/18/2019, 04/18/2019   Zoster, Live 04/28/2009    Conditions to be addressed/monitored:  Hypertension, Hyperlipidemia, Heart Failure, Coronary Artery Disease, Anxiety, Osteoarthritis, and Overactive Bladder, hx stroke  Care Plan : CKeener Updates made by FCharlton Haws [place] 02/21/2021 12:00 AM     Problem: Hypertension, Hyperlipidemia, Heart Failure, Coronary  Artery Disease, Anxiety, Osteoarthritis, and Overactive Bladder, hx stroke   Priority: High     Long-Range Goal: Disease management   Start Date: 08/11/2020  Expected End Date: 08/11/2021  This Visit's Progress: On track  Recent Progress: On track  Priority: High  Note:   Current Barriers:  Unable to independently monitor therapeutic efficacy Unable to achieve control of overactive bladder   Pharmacist Clinical Goal(s):  Patient will achieve adherence to  monitoring guidelines and medication adherence to achieve therapeutic efficacy achieve control of OAB as evidenced by patient report through collaboration with PharmD and provider.   Interventions: 1:1 collaboration with Susan Rail, MD regarding development and update of comprehensive plan of care as evidenced by provider attestation and co-signature Inter-disciplinary care team collaboration (see longitudinal plan of care) Comprehensive medication review performed; medication list updated in electronic medical record  Hyperlipidemia / CAD: (LDL goal < 70) -Controlled - LDL is at goal; pt reports occasional muscle cramps but it is tolerable -cath 10/2016: moderate to moderate-severe 3 vessel disease, aggressive risk factor modification recommended -hx cerebral infarction 06/2012 -Current treatment: Rosuvastatin 20 mg daily HS Clopidogrel 75 mg daily AM Nitroglycerin 0.4 mg SL prn - never used Aspirin 325 mg -Educated on Cholesterol goals; Benefits of statin for ASCVD risk reduction; -Recommended to continue current medication  Heart Failure / HTN (Goal: BP goal < 130/80, prevent exacerbations) -Controlled -hx paroxysmal SVT -Last ejection fraction: 60-65% (Date: 11/2016) -HF type: Diastolic -NYHA Class: I (no actitivty limitation) -Current home BP/HR readings: 124/68 -Current treatment: Losartan 50 mg daily HS Furosemide 20 mg daily, extra 20 mg PRN wt gain/edema Diltiazem CD 180 mg daily HS Metoprolol PRN breakthrough palpitations -Educated on Benefits of medications for managing symptoms and prolonging life Importance of weighing daily; if you gain more than 3 pounds in one day or 5 pounds in one week, take extra 1/2 of furosemide Proper diuretic administration and potassium supplementation -Recommended to continue current medication  Anxiety / Insomnia (Goal: manage symptoms) -Not ideally controlled - pt is getting up multiple times per night due to "bladder spasms" (see  below);  -Current treatment: Alprazolam 0.5 mg - 1/2 tab HS prn Trazodone 100 mg HS -Counseled on need to control bladder issues to help with sleep -Recommended to continue current medication  Overactive bladder (Goal: reduce frequency/urgency) -Improved somewhat - pt reports she has noticed improvement since starting Myrbetriq, unfortunately it was $290 in donut hole; she has been splitting it in half -Pt reports bladder spasms at night are still an issue, limiting quality of sleep; she is not sure Premarin cream is helping at all and is also expensive -Current treatment  Premarin vaginal cream twice a week Myrbetriq 25 mg - pt taking 1/2 tab daily -Medications previously tried: Retail buyer (2017) -Counseled Myrbetriq has slow-release properties and therefore should not be cut in half -Counseld on dietary adjustments to help with bladder spasms - advised to limit caffeine, alcohol, acidic foods/citrus -Recommended trial off premarin cream for several weeks, re-assess if it is helping or not; can d/c if not helping  Health Maintenance -Vaccine gaps: Flu  Patient Goals/Self-Care Activities Patient will:  - take medications as prescribed focus on medication adherence by pill box check blood pressure periodically -Limit caffeine, alcohol, acidic foods to help with bladder spasms -Utilize [facility] for medication synchronization, packaging and delivery      Compliance/Adherence/Medication fill history: Care Gaps: None  Star-Rating Drugs: Losartan -LF 02/11/21 x 90 ds Rosuvastatin - LF 01/04/21 x 90  ds  Medication Assistance: None required.  Patient affirms current coverage meets needs.  Patient's preferred pharmacy is:  [facility] [place], [place] [address] Phone: [phone] Fax: [phone]  [facility] - [place], [place] - [address] [place] [address] Phone: [phone] Fax: [phone]  [id number] [place] [id number]: [phone]: [phone] Uses pill box? Yes Pt endorses 100% compliance  We discussed: Verbal consent obtained for [facility] enhanced pharmacy services (medication synchronization, adherence packaging, delivery coordination). A medication sync plan was created to allow patient to get all medications delivered once every 30 to 90 days per patient preference. Patient understands they have freedom to choose pharmacy and clinical pharmacist will coordinate care between all prescribers and [facility].  Patient decided to: Utilize [facility] for medication synchronization, packaging and delivery  Care Plan and Follow Up Patient Decision:  Patient agrees to Care Plan and Follow-up.  Plan: Telephone follow up appointment with care management team member scheduled for:  6 months  LCharlene Brooke PharmD, BRio CPP Clinical Pharmacist [phone]

## 2021-02-21 NOTE — Patient Instructions (Signed)
Visit Information  Phone number for Pharmacist: [phone]   Goals Addressed             This Visit's Progress    Manage My Medicine       Timeframe:  Long-Range Goal Priority:  Medium Start Date:     08/11/20                        Expected End Date:  08/11/21                      Follow Up Date May 2023   - call for medicine refill 2 or 3 days before it runs out - call if I am sick and can't take my medicine - keep a list of all the medicines I take; vitamins and herbals too  -Limit caffeine, alcohol, acidic foods to help with bladder spasms -Utilize [facility] for medication synchronization, packaging and delivery  -Utilize [facility] for medication synchronization, packaging and delivery  Why is this important?   These steps will help you keep on track with your medicines.   Notes:         Care Plan : [place]  Updates made by Charlton Haws, RPH since 02/21/2021 12:00 AM     Problem: Hypertension, Hyperlipidemia, Heart Failure, Coronary Artery Disease, Anxiety, Osteoarthritis, and Overactive Bladder, hx stroke   Priority: High     Long-Range Goal: Disease management   Start Date: 08/11/2020  Expected End Date: 08/11/2021  This Visit's Progress: On track  Recent Progress: On track  Priority: High  Note:   Current Barriers:  Unable to independently monitor therapeutic efficacy Unable to achieve control of overactive bladder   Pharmacist Clinical Goal(s):  Patient will achieve adherence to monitoring guidelines and medication adherence to achieve therapeutic efficacy achieve control of OAB as evidenced by patient report through collaboration with PharmD and provider.   Interventions: 1:1 collaboration with Binnie Rail, MD regarding development and update of comprehensive plan of care as evidenced by provider attestation and co-signature Inter-disciplinary care team collaboration (see longitudinal plan of  care) Comprehensive medication review performed; medication list updated in electronic medical record  Hyperlipidemia / CAD: (LDL goal < 70) -Controlled - LDL is at goal; pt reports occasional muscle cramps but it is tolerable -cath 10/2016: moderate to moderate-severe 3 vessel disease, aggressive risk factor modification recommended -hx cerebral infarction 06/2012 -Current treatment: Rosuvastatin 20 mg daily HS Clopidogrel 75 mg daily AM Nitroglycerin 0.4 mg SL prn - never used Aspirin 325 mg -Educated on Cholesterol goals; Benefits of statin for ASCVD risk reduction; -Recommended to continue current medication  Heart Failure / HTN (Goal: BP goal < 130/80, prevent exacerbations) -Controlled -hx paroxysmal SVT -Last ejection fraction: 60-65% (Date: 11/2016) -HF type: Diastolic -NYHA Class: I (no actitivty limitation) -Current home BP/HR readings: 124/68 -Current treatment: Losartan 50 mg daily HS Furosemide 20 mg daily, extra 20 mg PRN wt gain/edema Diltiazem CD 180 mg daily HS Metoprolol PRN breakthrough palpitations -Educated on Benefits of medications for managing symptoms and prolonging life Importance of weighing daily; if you gain more than 3 pounds in one day or 5 pounds in one week, take extra 1/2 of furosemide Proper diuretic administration and potassium supplementation -Recommended to continue current medication  Anxiety / Insomnia (Goal: manage symptoms) -Not ideally controlled - pt is getting up multiple times per night due to "bladder spasms" (see below);  -Current treatment: Alprazolam  0.5 mg - 1/2 tab HS prn Trazodone 100 mg HS -Counseled on need to control bladder issues to help with sleep -Recommended to continue current medication  Overactive bladder (Goal: reduce frequency/urgency) -Improved somewhat - pt reports she has noticed improvement since starting Myrbetriq, unfortunately it was $290 in donut hole; she has been splitting it in half -Pt reports bladder  spasms at night are still an issue, limiting quality of sleep; she is not sure Premarin cream is helping at all and is also expensive -Current treatment  Premarin vaginal cream twice a week Myrbetriq 25 mg - pt taking 1/2 tab daily -Medications previously tried: Retail buyer (2017) -Counseled Myrbetriq has slow-release properties and therefore should not be cut in half -Counseld on dietary adjustments to help with bladder spasms - advised to limit caffeine, alcohol, acidic foods/citrus -Recommended trial off premarin cream for several weeks, re-assess if it is helping or not; can d/c if not helping  Health Maintenance -Vaccine gaps: Flu  Patient Goals/Self-Care Activities Patient will:  - take medications as prescribed focus on medication adherence by pill box check blood pressure periodically -Limit caffeine, alcohol, acidic foods to help with bladder spasms -Utilize [facility] for medication synchronization, packaging and delivery      Patient verbalizes understanding of instructions provided today and agrees to view in [place].  Telephone follow up appointment with pharmacy team member scheduled for: 6 months  Charlene Brooke, PharmD, [place], CPP Clinical Pharmacist [facility] [phone]

## 2021-03-07 ENCOUNTER — Other Ambulatory Visit: Payer: Self-pay | Admitting: Internal Medicine

## 2021-03-14 ENCOUNTER — Other Ambulatory Visit: Payer: Self-pay | Admitting: Internal Medicine

## 2021-03-14 DIAGNOSIS — Z1231 Encounter for screening mammogram for malignant neoplasm of breast: Secondary | ICD-10-CM

## 2021-03-16 DIAGNOSIS — I110 Hypertensive heart disease with heart failure: Secondary | ICD-10-CM | POA: Diagnosis not present

## 2021-03-16 DIAGNOSIS — I2510 Atherosclerotic heart disease of native coronary artery without angina pectoris: Secondary | ICD-10-CM | POA: Diagnosis not present

## 2021-03-16 DIAGNOSIS — I5032 Chronic diastolic (congestive) heart failure: Secondary | ICD-10-CM | POA: Diagnosis not present

## 2021-03-16 DIAGNOSIS — E785 Hyperlipidemia, unspecified: Secondary | ICD-10-CM

## 2021-03-18 ENCOUNTER — Encounter (HOSPITAL_BASED_OUTPATIENT_CLINIC_OR_DEPARTMENT_OTHER): Payer: Self-pay | Admitting: Emergency Medicine

## 2021-03-18 ENCOUNTER — Emergency Department (HOSPITAL_BASED_OUTPATIENT_CLINIC_OR_DEPARTMENT_OTHER)
Admission: EM | Admit: 2021-03-18 | Discharge: 2021-03-18 | Disposition: A | Discharge status: Home / Self Care | Payer: PPO | Attending: Emergency Medicine | Admitting: Emergency Medicine

## 2021-03-18 ENCOUNTER — Other Ambulatory Visit: Payer: Self-pay

## 2021-03-18 DIAGNOSIS — Z5321 Procedure and treatment not carried out due to patient leaving prior to being seen by health care provider: Secondary | ICD-10-CM | POA: Diagnosis not present

## 2021-03-18 DIAGNOSIS — R002 Palpitations: Secondary | ICD-10-CM | POA: Diagnosis not present

## 2021-03-18 NOTE — ED Triage Notes (Signed)
Pt presents to ED Pov. Pt c/o palpitations. Pt has hx of SVT. Pt reports that she started feeling like she went into SVT ~1402, tooke a metoprolol. HR stayed in the 160's. Took another metoprolol at 1406.

## 2021-03-18 NOTE — ED Notes (Signed)
Spoke with pt in lobby, who verbalized intent to leave. EKG, VS, pulse, and HS reviewed/ assessed. Encouraged to stay, and/or return if changes mind, develops new sx, worsens, and to f/u with PCP and cardiologist.

## 2021-04-14 ENCOUNTER — Other Ambulatory Visit: Payer: Self-pay | Admitting: Internal Medicine

## 2021-04-14 ENCOUNTER — Ambulatory Visit
Admission: RE | Admit: 2021-04-14 | Discharge: 2021-04-14 | Disposition: A | Discharge status: Home / Self Care | Payer: PPO | Source: Ambulatory Visit

## 2021-04-14 DIAGNOSIS — K219 Gastro-esophageal reflux disease without esophagitis: Secondary | ICD-10-CM

## 2021-04-14 DIAGNOSIS — Z1231 Encounter for screening mammogram for malignant neoplasm of breast: Secondary | ICD-10-CM

## 2021-04-27 ENCOUNTER — Other Ambulatory Visit: Payer: Self-pay | Admitting: Internal Medicine

## 2021-04-27 DIAGNOSIS — G4709 Other insomnia: Secondary | ICD-10-CM

## 2021-05-13 ENCOUNTER — Other Ambulatory Visit: Payer: Self-pay | Admitting: Internal Medicine

## 2021-05-13 DIAGNOSIS — G4709 Other insomnia: Secondary | ICD-10-CM

## 2021-05-17 NOTE — Patient Instructions (Addendum)
° ° ° °  Blood work was ordered.     Medications changes include :   estrace cream twice weekly  Your prescription(s) have been submitted to your pharmacy. Please take as directed and contact our office if you believe you are having problem(s) with the medication(s).   Please followup in 6 months

## 2021-05-17 NOTE — Progress Notes (Signed)
Subjective:    Patient ID: Susan Gillespie, female    DOB: 1945-11-26, 76 y.o.   MRN: [id number]  This visit occurred during the SARS-CoV-2 public health emergency.  Safety protocols were in place, including screening questions prior to the visit, additional usage of staff PPE, and extensive cleaning of exam room while observing appropriate contact time as indicated for disinfecting solutions.     HPI The patient is here for follow up of their chronic medical problems, including CAD, CHF, htn, hld, prediabetes, neck pain, OA, IBS, blader spasms, insomnia, anxiety  Quit smoking 35-she has lost a couple pounds since she was here last 6 months ago.  She denies any significant changes in her eating or activity.  She was wondering about screening for lung cancer because she does have a history of smoking, but wants to think about that this time.  Medications and allergies reviewed with patient and updated if appropriate.  Patient Active Problem List   Diagnosis Date Noted   Aortic atherosclerosis ([place]) 11/11/2020   Lower back pain 11/11/2019   Bladder spasms 10/09/2017   Migraine without status migrainosus, not intractable 10/09/2017   Spondylosis of cervical region without myelopathy or radiculopathy 10/09/2017   Insomnia 10/09/2017   IBS (irritable bowel syndrome) 10/09/2017   Chronic diastolic CHF (congestive heart failure), NYHA class 1 ([name]) 02/20/2017   CAD in native artery 11/10/2016   Carotid stenosis, bilateral 08/31/2016   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HCC) 01/05/2016   Pernicious anemia 09/15/2013   B12 deficiency anemia 09/15/2013   Situational anxiety 12/20/2012   Spinal stenosis of lumbar region 07/05/2012   Cerebral infarction ([place]) 06/28/2012   Bleeding tendency ([place]) 05/16/2012   Paroxysmal supraventricular tachycardia ([place]) 09/25/2011   Essential hypertension, benign 08/22/2010   Prediabetes 04/28/2009   Hyperlipidemia LDL goal <70 12/16/2008   GERD 12/16/2008    Osteoarthritis 12/16/2008    Current Outpatient Medications on File Prior to Visit  Medication Sig Dispense Refill   acetaminophen (TYLENOL) 650 MG CR tablet Take 1,300 mg by mouth every 8 (eight) hours as needed for pain.      aspirin 325 MG tablet Take 1 tablet (325 mg total) by mouth daily. 30 tablet 0   cetirizine (ZYRTEC) 10 MG tablet Take 10 mg by mouth every morning.     clopidogrel (PLAVIX) 75 MG tablet TAKE 1 TABLET BY MOUTH EVERY DAY 90 tablet 1   diltiazem (CARDIZEM CD) 180 MG 24 hr capsule Take 1 capsule (180 mg total) by mouth at bedtime. 180 capsule 3   famotidine (PEPCID) 40 MG tablet TAKE 1 TABLET BY MOUTH EVERY DAY 90 tablet 1   ferrous sulfate 325 (65 FE) MG tablet Take 325 mg by mouth daily with breakfast.     fluticasone (FLONASE) 50 MCG/ACT nasal spray Place into both nostrils daily.     furosemide (LASIX) 40 MG tablet Take 1/2 tablet by mouth daily. May take additional 1/2 tablet as needed for Edema. 90 tablet 3   losartan (COZAAR) 50 MG tablet Take 1 tablet (50 mg total) by mouth daily. 90 tablet 3   methocarbamol (ROBAXIN) 500 MG tablet Take 1 tablet (500 mg total) by mouth daily as needed for muscle spasms. 60 tablet 2   metoprolol succinate (TOPROL-XL) 50 MG 24 hr tablet Take 50 mg by mouth daily. Take with or immediately following a meal.     potassium chloride (KLOR-CON) 10 MEQ tablet TAKE 1 TABLET BY MOUTH EVERY DAY 90 tablet 1  rosuvastatin (CRESTOR) 20 MG tablet TAKE 1 TABLET BY MOUTH EVERYDAY AT BEDTIME 90 tablet 1   SUMAtriptan (IMITREX) 50 MG tablet Take 50 mg by mouth every 2 (two) hours as needed for migraine. May repeat in 2 hours if headache persists or recurs.     traZODone (DESYREL) 100 MG tablet TAKE 1 TABLET BY MOUTH EVERYDAY AT BEDTIME 90 tablet 1   vitamin B-12 (CYANOCOBALAMIN) 500 MCG tablet Take 500 mcg by mouth daily.     nitroGLYCERIN (NITROSTAT) 0.4 MG SL tablet Place 1 tablet (0.4 mg total) under the tongue every 5 (five) minutes as needed  for chest pain. 25 tablet 3   No current facility-administered medications on file prior to visit.    Past Medical History:  Diagnosis Date   Anxiety    Artery stenosis (HCC)    left internal carotid, left subclavian   Arthritis    Bleeding tendency ([place]) 05/16/2012   Hx post op bleeding (epidural hematoma s/p ACDF '10; LLQ hematoma due to superficial vessel fascial bleed post colon resection; Normal platelet count; saw Dr. [name] '14   CAD in native artery    a. mod to mod-severe CAD by cath 10/2016, med rx.   Carotid stenosis, bilateral 08/31/2016   1-39% right and 50-69% lefft ICA stenosis dopplers 04/2019   Chronic diastolic CHF (congestive heart failure) ([place])    Colon polyp    Diverticulitis    Family history of cardiovascular disease    Fracture of right fibula    GERD (gastroesophageal reflux disease)    Headache(784.0)    mirgraines - history of   Hearing aid worn    B/L   HOH (hard of hearing)    wears hearing aids   Hyperlipidemia    Hypertension    IBS (irritable bowel syndrome)    followed by Dr. [name]   Liver cyst    Melanoma ([place])    removed from back 04/2013   Shortness of breath dyspnea    with exertion   Stroke ([place]) 06/2012   mild   SVT (supraventricular tachycardia) (HCC)    Urinary urgency    UTI (lower urinary tract infection)    x 2 since June 2016   Wears glasses     Past Surgical History:  Procedure Laterality Date   ABDOMINAL HYSTERECTOMY     ANGIOPLASTY     stent in subclavian LEFT 2016, angioplasty of stent 2017   APPENDECTOMY     AUGMENTATION MAMMAPLASTY Bilateral    replaced silicone with saline 9935   BREAST EXCISIONAL BIOPSY Left    benign 1977   BREAST SURGERY     BIL breast augmentation   CHOLECYSTECTOMY     COLON SURGERY     Sigmoid Colectomy, returned for post op bleeding   COLONOSCOPY W/ POLYPECTOMY     COLONOSCOPY WITH PROPOFOL N/A 05/05/2016   Procedure: COLONOSCOPY WITH PROPOFOL;  Surgeon: Alphonsa Overall, MD;   Location: [facility];  Service: General;  Laterality: N/A;   ESOPHAGOGASTRODUODENOSCOPY (EGD) WITH PROPOFOL N/A 05/05/2016   Procedure: ESOPHAGOGASTRODUODENOSCOPY (EGD) WITH PROPOFOL;  Surgeon: Alphonsa Overall, MD;  Location: [facility];  Service: General;  Laterality: N/A;   EYE SURGERY     eyelid drooping fixed   IR ANGIO INTRA EXTRACRAN SEL COM CAROTID INNOMINATE BILAT MOD SED  10/16/2018   IR ANGIO VERTEBRAL SEL SUBCLAVIAN INNOMINATE UNI L MOD SED  10/16/2018   IR ANGIO VERTEBRAL SEL VERTEBRAL UNI R MOD SED  10/16/2018   IR GENERIC HISTORICAL  12/24/2015   IR ANGIO INTRA EXTRACRAN SEL COM CAROTID INNOMINATE BILAT MOD SED 12/24/2015 Luanne Bras, MD [facility]   IR GENERIC HISTORICAL  12/24/2015   IR ANGIOGRAM EXTREMITY LEFT 12/24/2015 Luanne Bras, MD [facility]   IR GENERIC HISTORICAL  12/24/2015   IR ANGIO VERTEBRAL SEL VERTEBRAL UNI R MOD SED 12/24/2015 Luanne Bras, MD [facility]   IR GENERIC HISTORICAL  01/05/2016   IR PTA NON CORO-LOWER EXTREM 01/05/2016 Luanne Bras, MD [facility]   IR GENERIC HISTORICAL  03/07/2016   IR RADIOLOGIST EVAL & MGMT 03/07/2016 [facility]   JOINT REPLACEMENT Left    thumb   LEFT HEART CATH AND CORONARY ANGIOGRAPHY N/A 11/10/2016   Procedure: Left Heart Cath and Coronary Angiography;  Surgeon: Belva Crome, MD;  Location: [facility];  Service: Cardiovascular;  Laterality: N/A;   MASTOID DEBRIDEMENT     MASTOIDECTOMY REVISION     RADIOLOGY WITH ANESTHESIA N/A 11/09/2014   Procedure: STENT PLACEMENT;  Surgeon: Luanne Bras, MD;  Location: [facility];  Service: Radiology;  Laterality: N/A;   RADIOLOGY WITH ANESTHESIA N/A 05/31/2015   Procedure: RADIOLOGY WITH ANESTHESIA;  Surgeon: Luanne Bras, MD;  Location: [facility];  Service: Radiology;  Laterality: N/A;   RADIOLOGY WITH ANESTHESIA N/A 01/05/2016   Procedure: RADIOLOGY WITH ANESTHESIA ANGIOPLASTY WITH STENTNG;  Surgeon: Luanne Bras, MD;  Location: [facility];   Service: Radiology;  Laterality: N/A;   RADIOLOGY WITH ANESTHESIA N/A 10/16/2018   Procedure: STENTING;  Surgeon: Luanne Bras, MD;  Location: [facility];  Service: Radiology;  Laterality: N/A;   skin cancer excised  04/2013   Melonoma   SPINE SURGERY     cervical fusion, returned to OR for post op  bleeding   TONSILLECTOMY     TONSILLECTOMY     TUBAL LIGATION     VAGINA SURGERY     anterior posterior repair    Social History   Socioeconomic History   Marital status: Divorced    Spouse name: Not on file   Number of children: 2   Years of education: 14   Highest education level: Not on file  Occupational History   Occupation: Optician, dispensing: [employer]    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[employer], retired  Tobacco Use   Smoking status: Former    Packs/day: 2.00    Years: 22.00    Pack years: 44.00    Types: Cigarettes    Quit date: 04/23/1985    Years since quitting: 36.0   Smokeless tobacco: Never  Vaping Use   Vaping Use: Never used  Substance and Sexual Activity   Alcohol use: Yes    Alcohol/week: 4.0 standard drinks    Types: 4 Glasses of wine per week    Comment: 1 drink a  day   Drug use: No   Sexual activity: Yes    Birth control/protection: Surgical  Other Topics Concern   Not on file  Social History Narrative   ** Merged History Encounter **       Domestic partner   Regular exercise-no   Right handed   Caffeine use-- 2 cups coffee daily, rare soda   Social Determinants of Health   Financial Resource Strain: Low Risk    Difficulty of Paying Living Expenses: Not hard at all  Food Insecurity: No Food Insecurity   Worried About Charity fundraiser in the Last Year: Never true   Ran Out of Food in the Last  Year: Never true  Transportation Needs: No Transportation Needs   Lack of Transportation (Medical): No   Lack of Transportation (Non-Medical): No  Physical Activity: Sufficiently Active   Days of Exercise per Week: 5 days   Minutes of  Exercise per Session: 30 min  Stress: No Stress Concern Present   Feeling of Stress : Not at all  Social Connections: Moderately Integrated   Frequency of Communication with Friends and Family: More than three times a week   Frequency of Social Gatherings with Friends and Family: Twice a week   Attends Religious Services: More than 4 times per year   Active Member of [employer] or Organizations: No   Attends Music therapist: More than 4 times per year   Marital Status: Widowed    Family History  Problem Relation Age of Onset   Hyperlipidemia Other    Hypertension Other    Cancer Other        lung, esophagus, stomach   Stroke Other    Heart disease Father    Heart disease Sister    Heart attack Sister    Heart disease Brother    Heart attack Brother    Stroke Son     Review of Systems  Constitutional:  Negative for chills and fever.  HENT:  Positive for congestion (mild).   Respiratory:  Negative for cough, shortness of breath and wheezing.   Cardiovascular:  Negative for chest pain, palpitations and leg swelling.  Neurological:  Negative for light-headedness and headaches.      Objective:   Vitals:   05/18/21 1122  BP: 138/80  Pulse: 68  Temp: 98.1 F (36.7 C)  SpO2: 99%   BP Readings from Last 3 Encounters:  05/18/21 138/80  03/18/21 126/72  11/11/20 112/68   Wt Readings from Last 3 Encounters:  05/18/21 151 lb (68.5 kg)  03/18/21 149 lb (67.6 kg)  11/11/20 154 lb (69.9 kg)   Body mass index is 26.75 kg/m.   Physical Exam    Constitutional: Appears well-developed and well-nourished. No distress.  HENT:  Head: Normocephalic and atraumatic.  Neck: Neck supple. No tracheal deviation present. No thyromegaly present.  No cervical lymphadenopathy Cardiovascular: Normal rate, regular rhythm and normal heart sounds.   2/6 systolic murmur heard.  Bilateral carotid bruit .  No edema Pulmonary/Chest: Effort normal and breath sounds normal. No  respiratory distress. No has no wheezes. No rales.  Skin: Skin is warm and dry. Not diaphoretic.  Psychiatric: Normal mood and affect. Behavior is normal.      Assessment & Plan:    See Problem List for Assessment and Plan of chronic medical problems.

## 2021-05-18 ENCOUNTER — Encounter: Payer: Self-pay | Admitting: Internal Medicine

## 2021-05-18 ENCOUNTER — Other Ambulatory Visit: Payer: Self-pay

## 2021-05-18 ENCOUNTER — Ambulatory Visit (INDEPENDENT_AMBULATORY_CARE_PROVIDER_SITE_OTHER): Payer: PPO | Admitting: Internal Medicine

## 2021-05-18 VITALS — BP 138/80 | HR 68 | Temp 98.1°F | Ht 63.0 in | Wt 151.0 lb

## 2021-05-18 DIAGNOSIS — R7303 Prediabetes: Secondary | ICD-10-CM | POA: Diagnosis not present

## 2021-05-18 DIAGNOSIS — N3289 Other specified disorders of bladder: Secondary | ICD-10-CM

## 2021-05-18 DIAGNOSIS — I5032 Chronic diastolic (congestive) heart failure: Secondary | ICD-10-CM

## 2021-05-18 DIAGNOSIS — I10 Essential (primary) hypertension: Secondary | ICD-10-CM

## 2021-05-18 DIAGNOSIS — F418 Other specified anxiety disorders: Secondary | ICD-10-CM | POA: Diagnosis not present

## 2021-05-18 DIAGNOSIS — R8290 Unspecified abnormal findings in urine: Secondary | ICD-10-CM | POA: Diagnosis not present

## 2021-05-18 DIAGNOSIS — K219 Gastro-esophageal reflux disease without esophagitis: Secondary | ICD-10-CM

## 2021-05-18 DIAGNOSIS — G4709 Other insomnia: Secondary | ICD-10-CM | POA: Diagnosis not present

## 2021-05-18 DIAGNOSIS — E785 Hyperlipidemia, unspecified: Secondary | ICD-10-CM | POA: Diagnosis not present

## 2021-05-18 LAB — CBC WITH DIFFERENTIAL/PLATELET
Basophils Absolute: 0 10*3/uL (ref 0.0–0.1)
Basophils Relative: 0.5 % (ref 0.0–3.0)
Eosinophils Absolute: 0.1 10*3/uL (ref 0.0–0.7)
Eosinophils Relative: 2.2 % (ref 0.0–5.0)
HCT: 36.1 % (ref 36.0–46.0)
Hemoglobin: 12.1 g/dL (ref 12.0–15.0)
Lymphocytes Relative: 16.7 % (ref 12.0–46.0)
Lymphs Abs: 1 10*3/uL (ref 0.7–4.0)
MCHC: 33.5 g/dL (ref 30.0–36.0)
MCV: 98.9 fl (ref 78.0–100.0)
Monocytes Absolute: 0.5 10*3/uL (ref 0.1–1.0)
Monocytes Relative: 9.4 % (ref 3.0–12.0)
Neutro Abs: 4.2 10*3/uL (ref 1.4–7.7)
Neutrophils Relative %: 71.2 % (ref 43.0–77.0)
Platelets: 252 10*3/uL (ref 150.0–400.0)
RBC: 3.66 Mil/uL — ABNORMAL LOW (ref 3.87–5.11)
RDW: 12.9 % (ref 11.5–15.5)
WBC: 5.8 10*3/uL (ref 4.0–10.5)

## 2021-05-18 LAB — COMPREHENSIVE METABOLIC PANEL
ALT: 13 U/L (ref 0–35)
AST: 15 U/L (ref 0–37)
Albumin: 4.3 g/dL (ref 3.5–5.2)
Alkaline Phosphatase: 60 U/L (ref 39–117)
BUN: 16 mg/dL (ref 6–23)
CO2: 29 mEq/L (ref 19–32)
Calcium: 10 mg/dL (ref 8.4–10.5)
Chloride: 103 mEq/L (ref 96–112)
Creatinine, Ser: 0.93 mg/dL (ref 0.40–1.20)
GFR: 59.45 mL/min — ABNORMAL LOW (ref >60.00)
Glucose, Bld: 110 mg/dL — ABNORMAL HIGH (ref 70–99)
Potassium: 4.1 mEq/L (ref 3.5–5.1)
Sodium: 138 mEq/L (ref 135–145)
Total Bilirubin: 0.5 mg/dL (ref 0.2–1.2)
Total Protein: 6.7 g/dL (ref 6.0–8.3)

## 2021-05-18 LAB — URINALYSIS, ROUTINE W REFLEX MICROSCOPIC
Bilirubin Urine: NEGATIVE
Hgb urine dipstick: NEGATIVE
Ketones, ur: NEGATIVE
Leukocytes,Ua: NEGATIVE
Nitrite: NEGATIVE
RBC / HPF: NONE SEEN (ref 0–?)
Specific Gravity, Urine: 1.015 (ref 1.000–1.030)
Total Protein, Urine: NEGATIVE
Urine Glucose: NEGATIVE
Urobilinogen, UA: 0.2 (ref 0.0–1.0)
pH: 6 (ref 5.0–8.0)

## 2021-05-18 LAB — LIPID PANEL
Cholesterol: 164 mg/dL (ref 0–200)
HDL: 81.5 mg/dL (ref >39.00)
LDL Cholesterol: 57 mg/dL (ref 0–99)
NonHDL: 82.71
Total CHOL/HDL Ratio: 2
Triglycerides: 127 mg/dL (ref 0.0–149.0)
VLDL: 25.4 mg/dL (ref 0.0–40.0)

## 2021-05-18 LAB — HEMOGLOBIN A1C: Hgb A1c MFr Bld: 5.9 % (ref 4.6–6.5)

## 2021-05-18 MED ORDER — ALPRAZOLAM 0.5 MG PO TABS: 0.2500 mg | ORAL_TABLET | Freq: Every evening | ORAL | 5 refills | Status: DC | PRN

## 2021-05-18 MED ORDER — ESTRADIOL 0.1 MG/GM VA CREA: 1.0000 | TOPICAL_CREAM | VAGINAL | 12 refills | Status: DC

## 2021-05-18 NOTE — Assessment & Plan Note (Signed)
Chronic Regular exercise and healthy diet encouraged Check lipid panel  Continue rosuvastatin 20 mg daily 

## 2021-05-18 NOTE — Assessment & Plan Note (Signed)
Chronic Situational anxiety Uses alprazolam at night when she is not able to sleep or is anxious Continue alprazolam 0.25-0.5 mg daily as needed

## 2021-05-18 NOTE — Assessment & Plan Note (Signed)
Acute Check urinalysis, urine culture to rule out infection

## 2021-05-18 NOTE — Assessment & Plan Note (Signed)
Chronic Blood pressure well controlled CMP Continue Cardizem 180 mg daily, Lasix 20 mg daily, losartan 50 mg daily, metoprolol XL 50 mg daily 

## 2021-05-18 NOTE — Assessment & Plan Note (Signed)
Chronic GERD controlled Continue famotidine 40 mg but start taking it every other day and slowly wean off of it - may be able to take it as needed

## 2021-05-18 NOTE — Assessment & Plan Note (Signed)
Chronic Intermittent Continue alprazolam 0.25-0.5 mg as needed

## 2021-05-18 NOTE — Assessment & Plan Note (Signed)
Chronic Check a1c Low sugar / carb diet Stressed regular exercise  

## 2021-05-18 NOTE — Assessment & Plan Note (Signed)
Chronic Euvolemic Following with cardiology Continue current medications 

## 2021-05-18 NOTE — Assessment & Plan Note (Signed)
Chronic  Not currently on premarin or myrbetriq - due to cost Trial of estrace cream - cheaper  Can consider older bladder medcation Consider referral to uro-gyn

## 2021-05-20 ENCOUNTER — Telehealth: Payer: Self-pay

## 2021-05-20 LAB — URINE CULTURE

## 2021-05-20 NOTE — Telephone Encounter (Signed)
Spoke with patient today. She states if she fills applicators up to the top she will not have enough to last her for 90 days because the tube is only 4 grams. She was wanting to know if she should only start with one gram or if a bigger tube could be sent in.

## 2021-05-20 NOTE — Telephone Encounter (Signed)
Pt contacted the Pharmacy for dosing instructions. Please reach out to Pt to clarify how much of the estradiol (ESTRACE VAGINAL) 0.1 MG/GM vaginal cream should be in the applicator twice a week.   Pt CB [phone]

## 2021-05-22 MED ORDER — AMOXICILLIN-POT CLAVULANATE 875-125 MG PO TABS: 1.0000 | ORAL_TABLET | Freq: Two times a day (BID) | ORAL | 0 refills | Status: AC

## 2021-05-22 NOTE — Addendum Note (Signed)
Addended by: Binnie Rail on: 05/22/2021 11:26 AM   Modules accepted: Orders

## 2021-05-22 NOTE — Telephone Encounter (Signed)
The applicator should be 1 gm and she should use it twice a week. So it should be 8 gm per month.  The tube I prescribed should be 42.5 gm.  So she should have enough.     Some patients do not use the applicator - they just use their finger.

## 2021-05-22 NOTE — Addendum Note (Signed)
Addended by: Binnie Rail on: 05/22/2021 12:34 PM   Modules accepted: Orders

## 2021-05-23 ENCOUNTER — Encounter: Payer: Self-pay | Admitting: Internal Medicine

## 2021-05-23 NOTE — Telephone Encounter (Signed)
Spoke with patient today. 

## 2021-05-31 ENCOUNTER — Encounter: Payer: Self-pay | Admitting: Internal Medicine

## 2021-06-30 ENCOUNTER — Other Ambulatory Visit: Payer: Self-pay | Admitting: Internal Medicine

## 2021-06-30 ENCOUNTER — Encounter: Payer: Self-pay | Admitting: Internal Medicine

## 2021-07-01 MED ORDER — MIRABEGRON ER 50 MG PO TB24: 50.0000 mg | ORAL_TABLET | Freq: Every day | ORAL | 2 refills | Status: DC

## 2021-07-03 ENCOUNTER — Other Ambulatory Visit: Payer: Self-pay | Admitting: Internal Medicine

## 2021-07-06 ENCOUNTER — Encounter: Payer: Self-pay | Admitting: Cardiology

## 2021-07-06 ENCOUNTER — Ambulatory Visit: Payer: PPO | Admitting: Cardiology

## 2021-07-06 ENCOUNTER — Other Ambulatory Visit: Payer: Self-pay

## 2021-07-06 VITALS — BP 114/66 | HR 75 | Ht 63.0 in | Wt 154.4 lb

## 2021-07-06 DIAGNOSIS — E785 Hyperlipidemia, unspecified: Secondary | ICD-10-CM | POA: Diagnosis not present

## 2021-07-06 DIAGNOSIS — I10 Essential (primary) hypertension: Secondary | ICD-10-CM | POA: Diagnosis not present

## 2021-07-06 DIAGNOSIS — I2510 Atherosclerotic heart disease of native coronary artery without angina pectoris: Secondary | ICD-10-CM | POA: Diagnosis not present

## 2021-07-06 DIAGNOSIS — I6523 Occlusion and stenosis of bilateral carotid arteries: Secondary | ICD-10-CM

## 2021-07-06 DIAGNOSIS — I5032 Chronic diastolic (congestive) heart failure: Secondary | ICD-10-CM

## 2021-07-06 DIAGNOSIS — I771 Stricture of artery: Secondary | ICD-10-CM | POA: Diagnosis not present

## 2021-07-06 DIAGNOSIS — I471 Supraventricular tachycardia: Secondary | ICD-10-CM | POA: Diagnosis not present

## 2021-07-06 NOTE — Patient Instructions (Signed)

## 2021-07-06 NOTE — Progress Notes (Signed)
?Cardiology Office Note:   ? ?Date:  07/06/2021  ? ?ID:  Susan Gillespie, DOB 25-Aug-1945, MRN [id number] ? ?PCP:  Binnie Rail, MD  ?Cardiologist:  Fransico Him, MD   ? ?Referring MD: Binnie Rail, MD  ? ?Chief Complaint  ?Patient presents with  ? Coronary Artery Disease  ? Congestive Heart Failure  ? Hypertension  ? Hyperlipidemia  ? ? ?History of Present Illness:   ? ?Susan Gillespie is a 76 y.o. female with a hx of paroxysmal supraventricular tachycardia.  She also has a history of left subclavian steal.  She underwent stenting by Dr. Judi Cong and then repeat angioplasty on 01/05/2016 down to 80-85% patency. Dopplers 05/2017 with > 50% stenosis with patent stent in the left subclavian artery.  Repeat dopplers in 11/2017 showed 1-39% bilateral carotid stenosis with no mention of subclavian artery flow.    ? ?Cardiac CT for SOB 10/26/16 showed calcium score 92nd percentile for age/sex, significant calcific 3V disease with possibly hemodynamically significant stenosis in mid RCA and mLAD; subsequent FFR overread showed functionally significant D1 disease. She underwent LHC 11/10/16 showing moderate to moderately severe 3v calcific disease with anatomy and obstruction similar to that reported on cor CTA - Proximal 50-60% LAD, 70% proximal first diagonal, 50-60% prox-mid Cx ostial 60% and mid 50% RCa, normal LV function with elevated EDP c/w chronic diastolic CHF.  She is now on medical management.   ? ?She is here today for followup and is doing well.  She has had 2 episodes of SVT in the past year the longest lasting about 60 min and converted on its own.  She has chronic DOE that is very stable. She denies any chest pain or pressure, PND, orthopnea, LE edema (except when she was on a cruise).  She had an episode of syncope while on a cruise.  She had been off the ship on a "booze Cruise"  and had been drinking but she did not feel bad.  She was walking back to the ship and had no symptoms and fell out.  THey thought  she was drunk but she does not think she was.  She went back and laid down for 1 hour and got up and felt fine with no hangover.  She has not had any further episodes. She is compliant with her meds and is tolerating meds with no SE.    ? ?Past Medical History:  ?Diagnosis Date  ? Anxiety   ? Artery stenosis ([place])   ? left internal carotid, left subclavian  ? Arthritis   ? Bleeding tendency ([place]) 05/16/2012  ? Hx post op bleeding (epidural hematoma s/p ACDF '10; LLQ hematoma due to superficial vessel fascial bleed post colon resection; Normal platelet count; saw Dr. [name] '14  ? CAD in native artery   ? a. mod to mod-severe CAD by cath 10/2016, med rx.  ? Carotid stenosis, bilateral 08/31/2016  ? 1-39% right and 50-69% lefft ICA stenosis dopplers 04/2019  ? Chronic diastolic CHF (congestive heart failure) ([place])   ? Colon polyp   ? Diverticulitis   ? Family history of cardiovascular disease   ? Fracture of right fibula   ? GERD (gastroesophageal reflux disease)   ? Headache(784.0)   ? mirgraines - history of  ? Hearing aid worn   ? B/L  ? HOH (hard of hearing)   ? wears hearing aids  ? Hyperlipidemia   ? Hypertension   ? IBS (irritable bowel syndrome)   ?  followed by Dr. [name]  ? Liver cyst   ? Melanoma ([place])   ? removed from back 04/2013  ? Shortness of breath dyspnea   ? with exertion  ? Stroke [facility]) 06/2012  ? mild  ? SVT (supraventricular tachycardia) ([place])   ? Urinary urgency   ? UTI (lower urinary tract infection)   ? x 2 since June 2016  ? Wears glasses   ? ? ?Past Surgical History:  ?Procedure Laterality Date  ? ABDOMINAL HYSTERECTOMY    ? ANGIOPLASTY    ? stent in subclavian LEFT 2016, angioplasty of stent 2017  ? APPENDECTOMY    ? AUGMENTATION MAMMAPLASTY Bilateral   ? replaced silicone with saline 4098  ? BREAST EXCISIONAL BIOPSY Left   ? benign 1977  ? BREAST SURGERY    ? BIL breast augmentation  ? CHOLECYSTECTOMY    ? COLON SURGERY    ? Sigmoid Colectomy, returned for post op bleeding  ? COLONOSCOPY W/  POLYPECTOMY    ? COLONOSCOPY WITH PROPOFOL N/A 05/05/2016  ? Procedure: COLONOSCOPY WITH PROPOFOL;  Surgeon: Alphonsa Overall, MD;  Location: [facility];  Service: General;  Laterality: N/A;  ? ESOPHAGOGASTRODUODENOSCOPY (EGD) WITH PROPOFOL N/A 05/05/2016  ? Procedure: ESOPHAGOGASTRODUODENOSCOPY (EGD) WITH PROPOFOL;  Surgeon: Alphonsa Overall, MD;  Location: [facility];  Service: General;  Laterality: N/A;  ? EYE SURGERY    ? eyelid drooping fixed  ? IR ANGIO INTRA EXTRACRAN SEL COM CAROTID INNOMINATE BILAT MOD SED  10/16/2018  ? IR ANGIO VERTEBRAL SEL SUBCLAVIAN INNOMINATE UNI L MOD SED  10/16/2018  ? IR ANGIO VERTEBRAL SEL VERTEBRAL UNI R MOD SED  10/16/2018  ? IR GENERIC HISTORICAL  12/24/2015  ? IR ANGIO INTRA EXTRACRAN SEL COM CAROTID INNOMINATE BILAT MOD SED 12/24/2015 Luanne Bras, MD [facility]  ? IR GENERIC HISTORICAL  12/24/2015  ? IR ANGIOGRAM EXTREMITY LEFT 12/24/2015 Luanne Bras, MD [facility]  ? IR GENERIC HISTORICAL  12/24/2015  ? IR ANGIO VERTEBRAL SEL VERTEBRAL UNI R MOD SED 12/24/2015 Luanne Bras, MD [facility]  ? IR GENERIC HISTORICAL  01/05/2016  ? IR PTA NON CORO-LOWER EXTREM 01/05/2016 Luanne Bras, MD [facility]  ? IR GENERIC HISTORICAL  03/07/2016  ? IR RADIOLOGIST EVAL & MGMT 03/07/2016 [facility]  ? JOINT REPLACEMENT Left   ? thumb  ? LEFT HEART CATH AND CORONARY ANGIOGRAPHY N/A 11/10/2016  ? Procedure: Left Heart Cath and Coronary Angiography;  Surgeon: Belva Crome, MD;  Location: [facility];  Service: Cardiovascular;  Laterality: N/A;  ? MASTOID DEBRIDEMENT    ? MASTOIDECTOMY REVISION    ? RADIOLOGY WITH ANESTHESIA N/A 11/09/2014  ? Procedure: STENT PLACEMENT;  Surgeon: Luanne Bras, MD;  Location: [facility];  Service: Radiology;  Laterality: N/A;  ? RADIOLOGY WITH ANESTHESIA N/A 05/31/2015  ? Procedure: RADIOLOGY WITH ANESTHESIA;  Surgeon: Luanne Bras, MD;  Location: [facility];  Service: Radiology;  Laterality: N/A;  ? RADIOLOGY WITH ANESTHESIA N/A  01/05/2016  ? Procedure: RADIOLOGY WITH ANESTHESIA ANGIOPLASTY WITH STENTNG;  Surgeon: Luanne Bras, MD;  Location: [facility];  Service: Radiology;  Laterality: N/A;  ? RADIOLOGY WITH ANESTHESIA N/A 10/16/2018  ? Procedure: STENTING;  Surgeon: Luanne Bras, MD;  Location: [facility];  Service: Radiology;  Laterality: N/A;  ? skin cancer excised  04/2013  ? Melonoma  ? SPINE SURGERY    ? cervical fusion, returned to OR for post op  bleeding  ? TONSILLECTOMY    ? TONSILLECTOMY    ? TUBAL  LIGATION    ? VAGINA SURGERY    ? anterior posterior repair  ? ? ?Current Medications: ?Current Meds  ?Medication Sig  ? acetaminophen (TYLENOL) 650 MG CR tablet Take 1,300 mg by mouth every 8 (eight) hours as needed for pain.   ? ALPRAZolam (XANAX) 0.5 MG tablet Take 0.5-1 tablets (0.25-0.5 mg total) by mouth at bedtime as needed for anxiety or sleep.  ? aspirin 325 MG tablet Take 1 tablet (325 mg total) by mouth daily.  ? cetirizine (ZYRTEC) 10 MG tablet Take 10 mg by mouth every morning.  ? clopidogrel (PLAVIX) 75 MG tablet TAKE 1 TABLET BY MOUTH EVERY DAY  ? diltiazem (CARDIZEM CD) 180 MG 24 hr capsule Take 1 capsule (180 mg total) by mouth at bedtime.  ? estradiol (ESTRACE VAGINAL) 0.1 MG/GM vaginal cream Place 1 Applicatorful vaginally 2 (two) times a week.  ? famotidine (PEPCID) 40 MG tablet TAKE 1 TABLET BY MOUTH EVERY DAY  ? ferrous sulfate 325 (65 FE) MG tablet Take 325 mg by mouth daily with breakfast.  ? fluticasone (FLONASE) 50 MCG/ACT nasal spray Place into both nostrils daily.  ? furosemide (LASIX) 40 MG tablet Take 1/2 tablet by mouth daily. May take additional 1/2 tablet as needed for Edema.  ? losartan (COZAAR) 50 MG tablet Take 1 tablet (50 mg total) by mouth daily.  ? methocarbamol (ROBAXIN) 500 MG tablet Take 1 tablet (500 mg total) by mouth daily as needed for muscle spasms.  ? metoprolol succinate (TOPROL-XL) 50 MG 24 hr tablet Take 50 mg by mouth daily. Take with or immediately following a meal.  ? mirabegron  ER (MYRBETRIQ) 50 MG TB24 tablet Take 1 tablet (50 mg total) by mouth daily.  ? potassium chloride (KLOR-CON) 10 MEQ tablet TAKE 1 TABLET BY MOUTH EVERY DAY  ? rosuvastatin (CRESTOR) 20 MG tablet TAK

## 2021-07-10 ENCOUNTER — Encounter: Payer: Self-pay | Admitting: Cardiology

## 2021-07-11 DIAGNOSIS — H5201 Hypermetropia, right eye: Secondary | ICD-10-CM | POA: Diagnosis not present

## 2021-07-11 DIAGNOSIS — H52223 Regular astigmatism, bilateral: Secondary | ICD-10-CM | POA: Diagnosis not present

## 2021-07-11 DIAGNOSIS — H524 Presbyopia: Secondary | ICD-10-CM | POA: Diagnosis not present

## 2021-07-11 DIAGNOSIS — H353112 Nonexudative age-related macular degeneration, right eye, intermediate dry stage: Secondary | ICD-10-CM | POA: Diagnosis not present

## 2021-07-11 DIAGNOSIS — H353121 Nonexudative age-related macular degeneration, left eye, early dry stage: Secondary | ICD-10-CM | POA: Diagnosis not present

## 2021-07-11 MED ORDER — FUROSEMIDE 40 MG PO TABS: ORAL_TABLET | ORAL | 3 refills | Status: DC

## 2021-07-12 MED ORDER — FUROSEMIDE 20 MG PO TABS: 20.0000 mg | ORAL_TABLET | Freq: Every day | ORAL | 3 refills | Status: DC

## 2021-07-12 NOTE — Addendum Note (Signed)
Addended by: Antonieta Iba on: 07/12/2021 07:29 AM ? ? Modules accepted: Orders ? ?

## 2021-07-13 DIAGNOSIS — M533 Sacrococcygeal disorders, not elsewhere classified: Secondary | ICD-10-CM | POA: Diagnosis not present

## 2021-08-16 ENCOUNTER — Other Ambulatory Visit: Payer: Self-pay

## 2021-08-16 ENCOUNTER — Telehealth: Payer: Self-pay | Admitting: Internal Medicine

## 2021-08-16 MED ORDER — ESTRADIOL 0.1 MG/GM VA CREA: 1.0000 | TOPICAL_CREAM | VAGINAL | 3 refills | Status: DC

## 2021-08-16 NOTE — Telephone Encounter (Signed)
Pt is requesting a new rx for  estradiol (ESTRACE VAGINAL) 0.1 MG/GM vaginal cream w/ a 3 mo supply,  due to 3 mo supply  ?saving on cost ? ?Pt requesting rx sent to [id number] [place], [place]?

## 2021-08-16 NOTE — Telephone Encounter (Signed)
sent 

## 2021-08-18 ENCOUNTER — Telehealth: Payer: PPO

## 2021-08-31 ENCOUNTER — Other Ambulatory Visit (HOSPITAL_COMMUNITY): Payer: Self-pay | Admitting: Interventional Radiology

## 2021-08-31 ENCOUNTER — Telehealth (HOSPITAL_COMMUNITY): Payer: Self-pay

## 2021-08-31 DIAGNOSIS — I771 Stricture of artery: Secondary | ICD-10-CM

## 2021-08-31 NOTE — Telephone Encounter (Signed)
Called to schedule us carotid, no answer, left vm. AW  

## 2021-09-14 ENCOUNTER — Telehealth (HOSPITAL_COMMUNITY): Payer: Self-pay

## 2021-09-14 ENCOUNTER — Ambulatory Visit (HOSPITAL_COMMUNITY)
Admission: RE | Admit: 2021-09-14 | Discharge: 2021-09-14 | Disposition: A | Discharge status: Home / Self Care | Payer: PPO | Source: Ambulatory Visit | Attending: Interventional Radiology | Admitting: Interventional Radiology

## 2021-09-14 DIAGNOSIS — I771 Stricture of artery: Secondary | ICD-10-CM | POA: Diagnosis not present

## 2021-09-14 NOTE — Progress Notes (Signed)
Carotid artery duplex has been completed. Preliminary results can be found in CV Proc through chart review.   09/14/21 1:33 PM Susan Gillespie RVT

## 2021-09-14 NOTE — Telephone Encounter (Signed)
Left message for pt to f/u in 6 months. AW

## 2021-09-21 ENCOUNTER — Ambulatory Visit: Payer: PPO

## 2021-09-30 ENCOUNTER — Ambulatory Visit
Admission: EM | Admit: 2021-09-30 | Discharge: 2021-09-30 | Disposition: A | Discharge status: Home / Self Care | Payer: PPO | Attending: Emergency Medicine | Admitting: Emergency Medicine

## 2021-09-30 DIAGNOSIS — H00036 Abscess of eyelid left eye, unspecified eyelid: Secondary | ICD-10-CM

## 2021-09-30 MED ORDER — AMOXICILLIN-POT CLAVULANATE 875-125 MG PO TABS: 1.0000 | ORAL_TABLET | Freq: Two times a day (BID) | ORAL | 0 refills | Status: AC

## 2021-09-30 NOTE — ED Provider Notes (Signed)
[facility]    CSN: [id number] Arrival date & time: 09/30/21  1151    HISTORY   Chief Complaint  Patient presents with   Insect Bite   HPI Susan Gillespie is a 76 y.o. female. Patient presents to urgent care stating she believes she was bit on a bug while working in her yard 5 days ago.  States she has not noticed any changes in her vision but 2 days ago noticed that she began to have increased redness and swelling around her left eyelid.    Past Medical History:  Diagnosis Date   Anxiety    Artery stenosis (HCC)    left internal carotid, left subclavian   Arthritis    Bleeding tendency ([place]) 05/16/2012   Hx post op bleeding (epidural hematoma s/p ACDF '10; LLQ hematoma due to superficial vessel fascial bleed post colon resection; Normal platelet count; saw Dr. [name] '14   CAD in native artery    a. mod to mod-severe CAD by cath 10/2016, med rx.   Carotid stenosis, bilateral 08/31/2016   1-39% right and 50-69% lefft ICA stenosis dopplers 04/2019   Chronic diastolic CHF (congestive heart failure) ([place])    Colon polyp    Diverticulitis    Family history of cardiovascular disease    Fracture of right fibula    GERD (gastroesophageal reflux disease)    Headache(784.0)    mirgraines - history of   Hearing aid worn    B/L   HOH (hard of hearing)    wears hearing aids   Hyperlipidemia    Hypertension    IBS (irritable bowel syndrome)    followed by Dr. [name]   Liver cyst    Melanoma ([place])    removed from back 04/2013   Shortness of breath dyspnea    with exertion   Stroke ([place]) 06/2012   mild   SVT (supraventricular tachycardia) (HCC)    Urinary urgency    UTI (lower urinary tract infection)    x 2 since June 2016   Wears glasses    Patient Active Problem List   Diagnosis Date Noted   Abnormal urine odor 05/18/2021   Aortic atherosclerosis ([place]) 11/11/2020   Lower back pain 11/11/2019   Bladder spasms 10/09/2017   Migraine without status  migrainosus, not intractable 10/09/2017   Spondylosis of cervical region without myelopathy or radiculopathy 10/09/2017   Insomnia 10/09/2017   IBS (irritable bowel syndrome) 10/09/2017   Chronic diastolic CHF (congestive heart failure), NYHA class 1 ([place]) 02/20/2017   CAD in native artery 11/10/2016   Carotid stenosis, bilateral 08/31/2016   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[place]) 01/05/2016   Pernicious anemia 09/15/2013   B12 deficiency anemia 09/15/2013   Situational anxiety 12/20/2012   Spinal stenosis of lumbar region 07/05/2012   Cerebral infarction ([place]) 06/28/2012   Bleeding tendency ([place]) 05/16/2012   Paroxysmal supraventricular tachycardia ([place]) 09/25/2011   Essential hypertension, benign 08/22/2010   Prediabetes 04/28/2009   Hyperlipidemia LDL goal <70 12/16/2008   GERD 12/16/2008   Osteoarthritis 12/16/2008   Past Surgical History:  Procedure Laterality Date   ABDOMINAL HYSTERECTOMY     ANGIOPLASTY     stent in subclavian LEFT 2016, angioplasty of stent 2017   APPENDECTOMY     AUGMENTATION MAMMAPLASTY Bilateral    replaced silicone with saline 2409   BREAST EXCISIONAL BIOPSY Left    benign 1977   BREAST SURGERY     BIL breast augmentation   CHOLECYSTECTOMY     COLON  SURGERY     Sigmoid Colectomy, returned for post op bleeding   COLONOSCOPY W/ POLYPECTOMY     COLONOSCOPY WITH PROPOFOL N/A 05/05/2016   Procedure: COLONOSCOPY WITH PROPOFOL;  Surgeon: Alphonsa Overall, MD;  Location: [facility];  Service: General;  Laterality: N/A;   ESOPHAGOGASTRODUODENOSCOPY (EGD) WITH PROPOFOL N/A 05/05/2016   Procedure: ESOPHAGOGASTRODUODENOSCOPY (EGD) WITH PROPOFOL;  Surgeon: Alphonsa Overall, MD;  Location: [facility];  Service: General;  Laterality: N/A;   EYE SURGERY     eyelid drooping fixed   IR ANGIO INTRA EXTRACRAN SEL COM CAROTID INNOMINATE BILAT MOD SED  10/16/2018   IR ANGIO VERTEBRAL SEL SUBCLAVIAN INNOMINATE UNI L MOD SED  10/16/2018   IR ANGIO VERTEBRAL SEL VERTEBRAL UNI R  MOD SED  10/16/2018   IR GENERIC HISTORICAL  12/24/2015   IR ANGIO INTRA EXTRACRAN SEL COM CAROTID INNOMINATE BILAT MOD SED 12/24/2015 Luanne Bras, MD [facility]   IR GENERIC HISTORICAL  12/24/2015   IR ANGIOGRAM EXTREMITY LEFT 12/24/2015 Luanne Bras, MD [facility]   IR GENERIC HISTORICAL  12/24/2015   IR ANGIO VERTEBRAL SEL VERTEBRAL UNI R MOD SED 12/24/2015 Luanne Bras, MD [facility]   IR GENERIC HISTORICAL  01/05/2016   IR PTA NON CORO-LOWER EXTREM 01/05/2016 Luanne Bras, MD [facility]   IR GENERIC HISTORICAL  03/07/2016   IR RADIOLOGIST EVAL & MGMT 03/07/2016 [facility]   JOINT REPLACEMENT Left    thumb   LEFT HEART CATH AND CORONARY ANGIOGRAPHY N/A 11/10/2016   Procedure: Left Heart Cath and Coronary Angiography;  Surgeon: Belva Crome, MD;  Location: [facility];  Service: Cardiovascular;  Laterality: N/A;   MASTOID DEBRIDEMENT     MASTOIDECTOMY REVISION     RADIOLOGY WITH ANESTHESIA N/A 11/09/2014   Procedure: STENT PLACEMENT;  Surgeon: Luanne Bras, MD;  Location: [facility];  Service: Radiology;  Laterality: N/A;   RADIOLOGY WITH ANESTHESIA N/A 05/31/2015   Procedure: RADIOLOGY WITH ANESTHESIA;  Surgeon: Luanne Bras, MD;  Location: [facility];  Service: Radiology;  Laterality: N/A;   RADIOLOGY WITH ANESTHESIA N/A 01/05/2016   Procedure: RADIOLOGY WITH ANESTHESIA ANGIOPLASTY WITH STENTNG;  Surgeon: Luanne Bras, MD;  Location: [facility];  Service: Radiology;  Laterality: N/A;   RADIOLOGY WITH ANESTHESIA N/A 10/16/2018   Procedure: STENTING;  Surgeon: Luanne Bras, MD;  Location: [facility];  Service: Radiology;  Laterality: N/A;   skin cancer excised  04/2013   Melonoma   SPINE SURGERY     cervical fusion, returned to OR for post op  bleeding   TONSILLECTOMY     TONSILLECTOMY     TUBAL LIGATION     VAGINA SURGERY     anterior posterior repair   OB History   No obstetric history on file.    Home Medications    Prior to  Admission medications   Medication Sig Start Date End Date Taking? Authorizing Provider  ALPRAZolam Duanne Moron) 0.5 MG tablet Take 0.5-1 tablets (0.25-0.5 mg total) by mouth at bedtime as needed for anxiety or sleep. 05/18/21   Binnie Rail, MD  cetirizine (ZYRTEC) 10 MG tablet Take 10 mg by mouth every morning.    [provider]  clopidogrel (PLAVIX) 75 MG tablet TAKE 1 TABLET BY MOUTH EVERY DAY 07/04/21   Binnie Rail, MD  diltiazem (CARDIZEM CD) 180 MG 24 hr capsule Take 1 capsule (180 mg total) by mouth at bedtime. 07/12/20   Sueanne Margarita, MD  estradiol (ESTRACE VAGINAL) 0.1 MG/GM vaginal cream Place  1 Applicatorful vaginally 2 (two) times a week. 08/18/21   Binnie Rail, MD  famotidine (PEPCID) 40 MG tablet TAKE 1 TABLET BY MOUTH EVERY DAY 04/14/21   Plotnikov, Evie Lacks, MD  ferrous sulfate 325 (65 FE) MG tablet Take 325 mg by mouth daily with breakfast.    [provider]  fluticasone (FLONASE) 50 MCG/ACT nasal spray Place into both nostrils daily.    [provider]  furosemide (LASIX) 20 MG tablet Take 1 tablet (20 mg total) by mouth daily. May take an additional tablet for edema 07/12/21   Sueanne Margarita, MD  losartan (COZAAR) 50 MG tablet Take 1 tablet (50 mg total) by mouth daily. 11/16/20   Plotnikov, Evie Lacks, MD  methocarbamol (ROBAXIN) 500 MG tablet Take 1 tablet (500 mg total) by mouth daily as needed for muscle spasms. 11/11/20   Binnie Rail, MD  metoprolol succinate (TOPROL-XL) 50 MG 24 hr tablet Take 50 mg by mouth daily. Take with or immediately following a meal.    [provider]  mirabegron ER (MYRBETRIQ) 50 MG TB24 tablet Take 1 tablet (50 mg total) by mouth daily. 07/01/21   Binnie Rail, MD  nitroGLYCERIN (NITROSTAT) 0.4 MG SL tablet Place 1 tablet (0.4 mg total) under the tongue every 5 (five) minutes as needed for chest pain. 11/10/16 10/10/18  Eileen Stanford, PA-C  potassium chloride (KLOR-CON) 10 MEQ tablet TAKE 1 TABLET BY MOUTH  EVERY DAY 06/30/21   Binnie Rail, MD  rosuvastatin (CRESTOR) 20 MG tablet TAKE 1 TABLET BY MOUTH EVERYDAY AT BEDTIME 06/30/21   Burns, Claudina Lick, MD  SUMAtriptan (IMITREX) 50 MG tablet Take 50 mg by mouth every 2 (two) hours as needed for migraine. May repeat in 2 hours if headache persists or recurs.    [provider]  traZODone (DESYREL) 100 MG tablet TAKE 1 TABLET BY MOUTH EVERYDAY AT BEDTIME 05/13/21   Janith Lima, MD  vitamin B-12 (CYANOCOBALAMIN) 500 MCG tablet Take 500 mcg by mouth daily.    [provider]    Family History Family History  Problem Relation Age of Onset   Hyperlipidemia Other    Hypertension Other    Cancer Other        lung, esophagus, stomach   Stroke Other    Heart disease Father    Heart disease Sister    Heart attack Sister    Heart disease Brother    Heart attack Brother    Stroke Son    Social History Social History   Tobacco Use   Smoking status: Former    Packs/day: 2.00    Years: 22.00    Total pack years: 44.00    Types: Cigarettes    Quit date: 04/23/1985    Years since quitting: 36.4   Smokeless tobacco: Never  Vaping Use   Vaping Use: Never used  Substance Use Topics   Alcohol use: Yes    Alcohol/week: 4.0 standard drinks of alcohol    Types: 4 Glasses of wine per week    Comment: 1 drink a  day   Drug use: No   Allergies   Phenergan [promethazine hcl], Daypro [oxaprozin], Lisinopril, Oxycodone, Simvastatin, and Tramadol  Review of Systems Review of Systems Pertinent findings noted in history of present illness.   Physical Exam Triage Vital Signs ED Triage Vitals  Enc Vitals Group     BP 02/11/21 0827 (!) 147/82     Pulse Rate 02/11/21 0827 72  Resp 02/11/21 0827 18     Temp 02/11/21 0827 98.3 F (36.8 C)     Temp Source 02/11/21 0827 Oral     SpO2 02/11/21 0827 98 %     Weight --      Height --      Head Circumference --      Peak Flow --      Pain Score 02/11/21 0826 5     Pain Loc --       Pain Edu? --      Excl. in [place]? --   No data found.  Updated Vital Signs BP (!) 152/75 (BP Location: Right Arm)   Pulse 65   Temp 98.2 F (36.8 C) (Oral)   Resp 18   LMP  (LMP Unknown)   SpO2 95%   Physical Exam Vitals and nursing note reviewed.  Constitutional:      General: She is not in acute distress.    Appearance: Normal appearance. She is not ill-appearing.  HENT:     Head: Normocephalic and atraumatic.     Salivary Glands: Right salivary gland is not diffusely enlarged or tender. Left salivary gland is not diffusely enlarged or tender.     Right Ear: Tympanic membrane, ear canal and external ear normal. No drainage. No middle ear effusion. There is no impacted cerumen. Tympanic membrane is not erythematous or bulging.     Left Ear: Tympanic membrane, ear canal and external ear normal. No drainage.  No middle ear effusion. There is no impacted cerumen. Tympanic membrane is not erythematous or bulging.     Nose: Nose normal. No nasal deformity, septal deviation, mucosal edema, congestion or rhinorrhea.     Right Turbinates: Not enlarged, swollen or pale.     Left Turbinates: Not enlarged, swollen or pale.     Right Sinus: No maxillary sinus tenderness or frontal sinus tenderness.     Left Sinus: No maxillary sinus tenderness or frontal sinus tenderness.     Mouth/Throat:     Lips: Pink. No lesions.     Mouth: Mucous membranes are moist. No oral lesions.     Pharynx: Oropharynx is clear. Uvula midline. No posterior oropharyngeal erythema or uvula swelling.     Tonsils: No tonsillar exudate. 0 on the right. 0 on the left.  Eyes:     General: Lids are everted, no foreign bodies appreciated. Vision grossly intact. No allergic shiner, visual field deficit or scleral icterus.       Right eye: No foreign body, discharge or hordeolum.        Left eye: No foreign body, discharge or hordeolum.     Extraocular Movements: Extraocular movements intact.     Conjunctiva/sclera:  Conjunctivae normal.     Right eye: Right conjunctiva is not injected. No chemosis, exudate or hemorrhage.    Left eye: Left conjunctiva is not injected. No chemosis, exudate or hemorrhage.    Pupils: Pupils are equal, round, and reactive to light.     Comments: Swelling and mild tenderness to left upper and lower eyelids, small lesion concerning for insect bite at the lateral canthus of left eye  Neck:     Trachea: Trachea and phonation normal.  Cardiovascular:     Rate and Rhythm: Normal rate and regular rhythm.     Pulses: Normal pulses.     Heart sounds: Normal heart sounds. No murmur heard.    No friction rub. No gallop.  Pulmonary:     Effort: Pulmonary effort  is normal. No accessory muscle usage, prolonged expiration or respiratory distress.     Breath sounds: Normal breath sounds. No stridor, decreased air movement or transmitted upper airway sounds. No decreased breath sounds, wheezing, rhonchi or rales.  Chest:     Chest wall: No tenderness.  Musculoskeletal:        General: Normal range of motion.     Cervical back: Normal range of motion and neck supple. Normal range of motion.  Lymphadenopathy:     Cervical: No cervical adenopathy.  Skin:    General: Skin is warm and dry.     Findings: No erythema or rash.  Neurological:     General: No focal deficit present.     Mental Status: She is alert and oriented to person, place, and time.  Psychiatric:        Mood and Affect: Mood normal.        Behavior: Behavior normal.     Visual Acuity Right Eye Distance:   Left Eye Distance:   Bilateral Distance:    Right Eye Near:   Left Eye Near:    Bilateral Near:     UC Couse / Diagnostics / Procedures:    EKG  Radiology No results found.  Procedures Procedures (including critical care time)  UC Diagnoses / Final Clinical Impressions(s)   I have reviewed the triage vital signs and the nursing notes.  Pertinent labs & imaging results that were available during my  care of the patient were reviewed by me and considered in my medical decision making (see chart for details).    Final diagnoses:  Cellulitis of left eyelid   Provided with this 5-day prescription for Augmentin for presumed cellulitis in the soft tissue of her eyelid.  Return precautions advised.  ED Prescriptions     Medication Sig Dispense Auth. Provider   amoxicillin-clavulanate (AUGMENTIN) 875-125 MG tablet Take 1 tablet by mouth 2 (two) times daily for 5 days. 10 tablet Lynden Oxford Scales, PA-C      PDMP not reviewed this encounter.  Pending results:  Labs Reviewed - No data to display  Medications Ordered in UC: Medications - No data to display  Disposition Upon Discharge:  Condition: stable for discharge home Home: take medications as prescribed; routine discharge instructions as discussed; follow up as advised.  Patient presented with an acute illness with associated systemic symptoms and significant discomfort requiring urgent management. In my opinion, this is a condition that a prudent lay person (someone who possesses an average knowledge of health and medicine) may potentially expect to result in complications if not addressed urgently such as respiratory distress, impairment of bodily function or dysfunction of bodily organs.   Routine symptom specific, illness specific and/or disease specific instructions were discussed with the patient and/or caregiver at length.   As such, the patient has been evaluated and assessed, work-up was performed and treatment was provided in alignment with urgent care protocols and evidence based medicine.  Patient/parent/caregiver has been advised that the patient may require follow up for further testing and treatment if the symptoms continue in spite of treatment, as clinically indicated and appropriate.  Patient/parent/caregiver has been advised to return to the [facility] or PCP if no better; to PCP or the Emergency Department if new signs  and symptoms develop, or if the current signs or symptoms continue to change or worsen for further workup, evaluation and treatment as clinically indicated and appropriate  The patient will follow up with their current PCP if  and as advised. If the patient does not currently have a PCP we will assist them in obtaining one.   The patient may need specialty follow up if the symptoms continue, in spite of conservative treatment and management, for further workup, evaluation, consultation and treatment as clinically indicated and appropriate.   Patient/parent/caregiver verbalized understanding and agreement of plan as discussed.  All questions were addressed during visit.  Please see discharge instructions below for further details of plan.  Discharge Instructions:   Discharge Instructions      Please begin Augmentin 1 tablet twice daily for a full 5-day course.  If you do not see meaningful improvement of the redness and swelling around your eyelid or if the swelling becomes worse despite this antibiotic treatment, please return for repeat evaluation.  Your welcome to continue warm compresses to the right eyelid several times a day.  I do not believe that continued use of hydrocortisone or Neosporin is necessary at this point.  Thank you for visiting urgent care today.      This office note has been dictated using Museum/gallery curator.  Unfortunately, and despite my best efforts, this method of dictation can sometimes lead to occasional typographical or grammatical errors.  I apologize in advance if this occurs.     Lynden Oxford Scales, PA-C 09/30/21 1218

## 2021-09-30 NOTE — Discharge Instructions (Signed)
Please begin Augmentin 1 tablet twice daily for a full 5-day course.  If you do not see meaningful improvement of the redness and swelling around your eyelid or if the swelling becomes worse despite this antibiotic treatment, please return for repeat evaluation.  Your welcome to continue warm compresses to the right eyelid several times a day.  I do not believe that continued use of hydrocortisone or Neosporin is necessary at this point.  Thank you for visiting urgent care today.

## 2021-09-30 NOTE — ED Triage Notes (Signed)
Pt states she was bitten by a bug (unknown type) on Sunday next to her left eye. Patient states there is little vision change. There is some redness and swelling to left eye lid.   Home interventions: neosporin, hydrocortisone, compress

## 2021-10-06 ENCOUNTER — Other Ambulatory Visit: Payer: Self-pay | Admitting: Cardiology

## 2021-10-11 ENCOUNTER — Telehealth: Payer: PPO

## 2021-10-12 ENCOUNTER — Ambulatory Visit: Payer: PPO | Admitting: Cardiology

## 2021-10-19 ENCOUNTER — Encounter: Payer: Self-pay | Admitting: Internal Medicine

## 2021-10-19 ENCOUNTER — Other Ambulatory Visit: Payer: Self-pay

## 2021-10-26 ENCOUNTER — Other Ambulatory Visit: Payer: Self-pay | Admitting: Internal Medicine

## 2021-10-26 DIAGNOSIS — G4709 Other insomnia: Secondary | ICD-10-CM

## 2021-11-15 ENCOUNTER — Encounter: Payer: Self-pay | Admitting: Internal Medicine

## 2021-11-15 NOTE — Patient Instructions (Addendum)
     Blood work was ordered.  Xray of foot at [facility].    Medications changes include :   none    Return in about 6 months (around 05/19/2022) for Physical Exam.

## 2021-11-15 NOTE — Progress Notes (Unsigned)
Subjective:    Patient ID: Susan Gillespie, female    DOB: 04/19/1945, 76 y.o.   MRN: [id number]     HPI Valeri is here for follow up of her chronic medical problems, including CAD, CHF, htn, hld, prediabetes, neck pain, OA, IBS, bladder spasms, insomnia, anxiety    Medications and allergies reviewed with patient and updated if appropriate.  Current Outpatient Medications on File Prior to Visit  Medication Sig Dispense Refill   ALPRAZolam (XANAX) 0.5 MG tablet Take 0.5-1 tablets (0.25-0.5 mg total) by mouth at bedtime as needed for anxiety or sleep. 30 tablet 5   cetirizine (ZYRTEC) 10 MG tablet Take 10 mg by mouth every morning.     clopidogrel (PLAVIX) 75 MG tablet TAKE 1 TABLET BY MOUTH EVERY DAY 90 tablet 1   diltiazem (CARDIZEM CD) 180 MG 24 hr capsule TAKE 1 CAPSULE (180 MG TOTAL) BY MOUTH AT BEDTIME. 90 capsule 1   estradiol (ESTRACE VAGINAL) 0.1 MG/GM vaginal cream Place 1 Applicatorful vaginally 2 (two) times a week. 127.5 g 3   famotidine (PEPCID) 40 MG tablet TAKE 1 TABLET BY MOUTH EVERY DAY 90 tablet 1   ferrous sulfate 325 (65 FE) MG tablet Take 325 mg by mouth daily with breakfast.     fluticasone (FLONASE) 50 MCG/ACT nasal spray Place into both nostrils daily.     furosemide (LASIX) 20 MG tablet Take 1 tablet (20 mg total) by mouth daily. May take an additional tablet for edema 90 tablet 3   losartan (COZAAR) 50 MG tablet Take 1 tablet (50 mg total) by mouth daily. 90 tablet 3   methocarbamol (ROBAXIN) 500 MG tablet Take 1 tablet (500 mg total) by mouth daily as needed for muscle spasms. 60 tablet 2   metoprolol succinate (TOPROL-XL) 50 MG 24 hr tablet Take 50 mg by mouth daily. Take with or immediately following a meal.     mirabegron ER (MYRBETRIQ) 50 MG TB24 tablet Take 1 tablet (50 mg total) by mouth daily. 90 tablet 2   nitroGLYCERIN (NITROSTAT) 0.4 MG SL tablet Place 1 tablet (0.4 mg total) under the tongue every 5 (five) minutes as needed for chest pain. 25  tablet 3   potassium chloride (KLOR-CON) 10 MEQ tablet TAKE 1 TABLET BY MOUTH EVERY DAY 90 tablet 1   rosuvastatin (CRESTOR) 20 MG tablet TAKE 1 TABLET BY MOUTH EVERYDAY AT BEDTIME 90 tablet 1   SUMAtriptan (IMITREX) 50 MG tablet Take 50 mg by mouth every 2 (two) hours as needed for migraine. May repeat in 2 hours if headache persists or recurs.     traZODone (DESYREL) 100 MG tablet TAKE 1 TABLET BY MOUTH EVERYDAY AT BEDTIME 90 tablet 1   vitamin B-12 (CYANOCOBALAMIN) 500 MCG tablet Take 500 mcg by mouth daily.     No current facility-administered medications on file prior to visit.     Review of Systems     Objective:  There were no vitals filed for this visit. BP Readings from Last 3 Encounters:  09/30/21 (!) 152/75  07/06/21 114/66  05/18/21 138/80   Wt Readings from Last 3 Encounters:  07/06/21 154 lb 6.4 oz (70 kg)  05/18/21 151 lb (68.5 kg)  03/18/21 149 lb (67.6 kg)   There is no height or weight on file to calculate BMI.    Physical Exam     Lab Results  Component Value Date   WBC 5.8 05/18/2021   HGB 12.1 05/18/2021   HCT  36.1 05/18/2021   PLT 252.0 05/18/2021   GLUCOSE 110 (H) 05/18/2021   CHOL 164 05/18/2021   TRIG 127.0 05/18/2021   HDL 81.50 05/18/2021   LDLDIRECT 50.0 10/30/2018   LDLCALC 57 05/18/2021   ALT 13 05/18/2021   AST 15 05/18/2021   NA 138 05/18/2021   K 4.1 05/18/2021   CL 103 05/18/2021   CREATININE 0.93 05/18/2021   BUN 16 05/18/2021   CO2 29 05/18/2021   TSH 1.92 11/11/2020   INR 1.0 10/16/2018   HGBA1C 5.9 05/18/2021     Assessment & Plan:    See Problem List for Assessment and Plan of chronic medical problems.

## 2021-11-16 ENCOUNTER — Ambulatory Visit (INDEPENDENT_AMBULATORY_CARE_PROVIDER_SITE_OTHER): Payer: PPO | Admitting: Internal Medicine

## 2021-11-16 ENCOUNTER — Other Ambulatory Visit: Payer: Self-pay

## 2021-11-16 ENCOUNTER — Ambulatory Visit (INDEPENDENT_AMBULATORY_CARE_PROVIDER_SITE_OTHER): Payer: PPO

## 2021-11-16 ENCOUNTER — Encounter: Payer: Self-pay | Admitting: Internal Medicine

## 2021-11-16 VITALS — BP 138/60 | HR 75 | Temp 98.0°F | Ht 63.0 in | Wt 151.0 lb

## 2021-11-16 DIAGNOSIS — F418 Other specified anxiety disorders: Secondary | ICD-10-CM

## 2021-11-16 DIAGNOSIS — K588 Other irritable bowel syndrome: Secondary | ICD-10-CM

## 2021-11-16 DIAGNOSIS — I10 Essential (primary) hypertension: Secondary | ICD-10-CM

## 2021-11-16 DIAGNOSIS — I2510 Atherosclerotic heart disease of native coronary artery without angina pectoris: Secondary | ICD-10-CM | POA: Diagnosis not present

## 2021-11-16 DIAGNOSIS — G4709 Other insomnia: Secondary | ICD-10-CM | POA: Diagnosis not present

## 2021-11-16 DIAGNOSIS — I5032 Chronic diastolic (congestive) heart failure: Secondary | ICD-10-CM | POA: Diagnosis not present

## 2021-11-16 DIAGNOSIS — E785 Hyperlipidemia, unspecified: Secondary | ICD-10-CM | POA: Diagnosis not present

## 2021-11-16 DIAGNOSIS — M79671 Pain in right foot: Secondary | ICD-10-CM

## 2021-11-16 DIAGNOSIS — R350 Frequency of micturition: Secondary | ICD-10-CM | POA: Diagnosis not present

## 2021-11-16 DIAGNOSIS — R7303 Prediabetes: Secondary | ICD-10-CM

## 2021-11-16 DIAGNOSIS — N3289 Other specified disorders of bladder: Secondary | ICD-10-CM

## 2021-11-16 DIAGNOSIS — M159 Polyosteoarthritis, unspecified: Secondary | ICD-10-CM

## 2021-11-16 LAB — LIPID PANEL
Cholesterol: 179 mg/dL (ref 0–200)
HDL: 81.5 mg/dL (ref >39.00)
LDL Cholesterol: 67 mg/dL (ref 0–99)
NonHDL: 97.41
Total CHOL/HDL Ratio: 2
Triglycerides: 151 mg/dL — ABNORMAL HIGH (ref 0.0–149.0)
VLDL: 30.2 mg/dL (ref 0.0–40.0)

## 2021-11-16 LAB — CBC WITH DIFFERENTIAL/PLATELET
Basophils Absolute: 0 10*3/uL (ref 0.0–0.1)
Basophils Relative: 0.6 % (ref 0.0–3.0)
Eosinophils Absolute: 0.2 10*3/uL (ref 0.0–0.7)
Eosinophils Relative: 3.5 % (ref 0.0–5.0)
HCT: 38.5 % (ref 36.0–46.0)
Hemoglobin: 12.6 g/dL (ref 12.0–15.0)
Lymphocytes Relative: 17.5 % (ref 12.0–46.0)
Lymphs Abs: 1.1 10*3/uL (ref 0.7–4.0)
MCHC: 32.6 g/dL (ref 30.0–36.0)
MCV: 99.8 fl (ref 78.0–100.0)
Monocytes Absolute: 0.5 10*3/uL (ref 0.1–1.0)
Monocytes Relative: 8 % (ref 3.0–12.0)
Neutro Abs: 4.3 10*3/uL (ref 1.4–7.7)
Neutrophils Relative %: 70.4 % (ref 43.0–77.0)
Platelets: 278 10*3/uL (ref 150.0–400.0)
RBC: 3.86 Mil/uL — ABNORMAL LOW (ref 3.87–5.11)
RDW: 13.8 % (ref 11.5–15.5)
WBC: 6.1 10*3/uL (ref 4.0–10.5)

## 2021-11-16 LAB — COMPREHENSIVE METABOLIC PANEL
ALT: 15 U/L (ref 0–35)
AST: 19 U/L (ref 0–37)
Albumin: 4.6 g/dL (ref 3.5–5.2)
Alkaline Phosphatase: 62 U/L (ref 39–117)
BUN: 19 mg/dL (ref 6–23)
CO2: 28 mEq/L (ref 19–32)
Calcium: 10 mg/dL (ref 8.4–10.5)
Chloride: 103 mEq/L (ref 96–112)
Creatinine, Ser: 1.02 mg/dL (ref 0.40–1.20)
GFR: 53.03 mL/min — ABNORMAL LOW (ref >60.00)
Glucose, Bld: 106 mg/dL — ABNORMAL HIGH (ref 70–99)
Potassium: 4.5 mEq/L (ref 3.5–5.1)
Sodium: 138 mEq/L (ref 135–145)
Total Bilirubin: 0.3 mg/dL (ref 0.2–1.2)
Total Protein: 7 g/dL (ref 6.0–8.3)

## 2021-11-16 LAB — URINALYSIS, ROUTINE W REFLEX MICROSCOPIC
Bilirubin Urine: NEGATIVE
Hgb urine dipstick: NEGATIVE
Ketones, ur: NEGATIVE
Leukocytes,Ua: NEGATIVE
Nitrite: NEGATIVE
RBC / HPF: NONE SEEN (ref 0–?)
Specific Gravity, Urine: 1.01 (ref 1.000–1.030)
Total Protein, Urine: NEGATIVE
Urine Glucose: NEGATIVE
Urobilinogen, UA: 0.2 (ref 0.0–1.0)
pH: 6 (ref 5.0–8.0)

## 2021-11-16 LAB — HEMOGLOBIN A1C: Hgb A1c MFr Bld: 5.9 % (ref 4.6–6.5)

## 2021-11-16 MED ORDER — METHOCARBAMOL 500 MG PO TABS
500.0000 mg | ORAL_TABLET | Freq: Every day | ORAL | 2 refills | Status: DC | PRN
Start: 2021-11-16 — End: 2022-07-18

## 2021-11-16 MED ORDER — VALACYCLOVIR HCL 1 G PO TABS: 1000.0000 mg | ORAL_TABLET | Freq: Two times a day (BID) | ORAL | 2 refills | Status: DC

## 2021-11-16 NOTE — Assessment & Plan Note (Signed)
Chronic Blood pressure well controlled CMP Continue Cardizem 180 mg daily, Lasix 20 mg daily, losartan 50 mg daily, metoprolol XL 50 mg daily

## 2021-11-16 NOTE — Assessment & Plan Note (Signed)
New Injury 12 days ago-she tripped out of her sandal-sandal broke Pain, swelling near first MTP joint, along the first metatarsal and across the distal part of her foot We will get x-ray to rule out a fracture Depending on results may need to see sports medicine or podiatry

## 2021-11-16 NOTE — Assessment & Plan Note (Addendum)
Chronic Myrbetriq 50 mg daily  Started Estrace vaginal cream at her last visit to see if that would help - needs to use it every 3 days Continue above

## 2021-11-16 NOTE — Assessment & Plan Note (Signed)
Chronic Controlled, Stable Continue alprazolam 0.25-0.5 mg daily as needed

## 2021-11-16 NOTE — Assessment & Plan Note (Signed)
Chronic Regular exercise and healthy diet encouraged Check lipid panel  Continue Crestor 20 mg daily 

## 2021-11-16 NOTE — Assessment & Plan Note (Signed)
Chronic Check a1c Low sugar / carb diet Stressed regular exercise  

## 2021-11-16 NOTE — Assessment & Plan Note (Signed)
Chronic Intermittent sleep issues Controlled Continue alprazolam 0.25-0.5 mg nightly as needed-does not take on a nightly basis

## 2021-11-16 NOTE — Assessment & Plan Note (Signed)
Chronic Euvolemic Following with cardiology Continue current medications

## 2021-11-16 NOTE — Assessment & Plan Note (Signed)
Chronic Following with cardiology No symptoms consistent with angina Continue current medications 

## 2021-11-16 NOTE — Assessment & Plan Note (Signed)
Acute Has urinary frequency, denies dysuria or hematuria Will check UA, urine culture to rule out infection

## 2021-11-17 ENCOUNTER — Encounter: Payer: Self-pay | Admitting: Internal Medicine

## 2021-11-18 LAB — URINE CULTURE

## 2021-11-18 MED ORDER — CEPHALEXIN 500 MG PO CAPS: 500.0000 mg | ORAL_CAPSULE | Freq: Two times a day (BID) | ORAL | 0 refills | Status: DC

## 2021-11-18 NOTE — Addendum Note (Signed)
Addended by: Binnie Rail on: 11/18/2021 12:48 PM   Modules accepted: Orders

## 2021-11-21 ENCOUNTER — Ambulatory Visit (INDEPENDENT_AMBULATORY_CARE_PROVIDER_SITE_OTHER): Payer: PPO

## 2021-11-21 DIAGNOSIS — Z0000 Encounter for general adult medical examination without abnormal findings: Secondary | ICD-10-CM

## 2021-11-21 NOTE — Progress Notes (Signed)
I connected with  Susan Gillespie on 11/21/21 by a video enabled telemedicine application and verified that I am speaking with the correct person using two identifiers.   I discussed the limitations of evaluation and management by telemedicine. The patient expressed understanding and agreed to proceed.     Subjective:   Susan Gillespie is a 76 y.o. female who presents for Medicare Annual (Subsequent) preventive examination.  Review of Systems           Objective:    There were no vitals filed for this visit. There is no height or weight on file to calculate BMI.     03/18/2021    3:04 PM 09/17/2020    1:45 PM 07/29/2019   11:34 AM 12/24/2018    5:34 PM 11/10/2016    8:47 AM 05/09/2016    4:58 PM 05/05/2016   12:09 PM  Advanced Directives  Does Patient Have a Medical Advance Directive? Yes Yes Yes Yes Yes Yes   Type of Advance Directive Out of facility DNR (pink MOST or yellow form);Living will;Healthcare Power of Attorney Living will;Healthcare Power of Attorney Living will [place];Living will [place];Living will Living will;Healthcare Power of Attorney   Does patient want to make changes to medical advance directive?  No - Patient declined   No - Patient declined    Copy of [place] in Chart?  No - copy requested   No - copy requested  No - copy requested    Current Medications (verified) Outpatient Encounter Medications as of 11/21/2021  Medication Sig   ALPRAZolam (XANAX) 0.5 MG tablet Take 0.5-1 tablets (0.25-0.5 mg total) by mouth at bedtime as needed for anxiety or sleep.   ASPIRIN 81 PO Take by mouth.   cephALEXin (KEFLEX) 500 MG capsule Take 1 capsule (500 mg total) by mouth 2 (two) times daily.   cetirizine (ZYRTEC) 10 MG tablet Take 10 mg by mouth every morning.   clopidogrel (PLAVIX) 75 MG tablet TAKE 1 TABLET BY MOUTH EVERY DAY   diltiazem (CARDIZEM CD) 180 MG 24 hr capsule TAKE 1 CAPSULE (180 MG TOTAL) BY MOUTH AT  BEDTIME.   estradiol (ESTRACE VAGINAL) 0.1 MG/GM vaginal cream Place 1 Applicatorful vaginally 2 (two) times a week.   famotidine (PEPCID) 40 MG tablet TAKE 1 TABLET BY MOUTH EVERY DAY   ferrous sulfate 325 (65 FE) MG tablet Take 325 mg by mouth daily with breakfast.   fluticasone (FLONASE) 50 MCG/ACT nasal spray Place into both nostrils daily.   furosemide (LASIX) 20 MG tablet Take 1 tablet (20 mg total) by mouth daily. May take an additional tablet for edema   losartan (COZAAR) 50 MG tablet Take 1 tablet (50 mg total) by mouth daily.   methocarbamol (ROBAXIN) 500 MG tablet Take 1 tablet (500 mg total) by mouth daily as needed for muscle spasms.   metoprolol succinate (TOPROL-XL) 50 MG 24 hr tablet Take 50 mg by mouth daily. Take with or immediately following a meal.   mirabegron ER (MYRBETRIQ) 50 MG TB24 tablet Take 1 tablet (50 mg total) by mouth daily.   potassium chloride (KLOR-CON) 10 MEQ tablet TAKE 1 TABLET BY MOUTH EVERY DAY   rosuvastatin (CRESTOR) 20 MG tablet TAKE 1 TABLET BY MOUTH EVERYDAY AT BEDTIME   SUMAtriptan (IMITREX) 50 MG tablet Take 50 mg by mouth every 2 (two) hours as needed for migraine. May repeat in 2 hours if headache persists or recurs.   traZODone (  DESYREL) 100 MG tablet TAKE 1 TABLET BY MOUTH EVERYDAY AT BEDTIME   valACYclovir (VALTREX) 1000 MG tablet Take 1 tablet (1,000 mg total) by mouth 2 (two) times daily.   vitamin B-12 (CYANOCOBALAMIN) 500 MCG tablet Take 500 mcg by mouth daily.   nitroGLYCERIN (NITROSTAT) 0.4 MG SL tablet Place 1 tablet (0.4 mg total) under the tongue every 5 (five) minutes as needed for chest pain.   No facility-administered encounter medications on file as of 11/21/2021.    Allergies (verified) Phenergan [promethazine hcl], Daypro [oxaprozin], Lisinopril, Oxycodone, Simvastatin, and Tramadol   History: Past Medical History:  Diagnosis Date   Anxiety    Artery stenosis (HCC)    left internal carotid, left subclavian   Arthritis     Bleeding tendency ([place]) 05/16/2012   Hx post op bleeding (epidural hematoma s/p ACDF '10; LLQ hematoma due to superficial vessel fascial bleed post colon resection; Normal platelet count; saw Dr. [name] '14   CAD in native artery    a. mod to mod-severe CAD by cath 10/2016, med rx.   Carotid stenosis, bilateral 08/31/2016   1-39% right and 50-69% lefft ICA stenosis dopplers 04/2019   Chronic diastolic CHF (congestive heart failure) ([place])    Colon polyp    Diverticulitis    Family history of cardiovascular disease    Fracture of right fibula    GERD (gastroesophageal reflux disease)    Headache(784.0)    mirgraines - history of   Hearing aid worn    B/L   HOH (hard of hearing)    wears hearing aids   Hyperlipidemia    Hypertension    IBS (irritable bowel syndrome)    followed by Dr. [name]   Liver cyst    Melanoma ([place])    removed from back 04/2013   Shortness of breath dyspnea    with exertion   Stroke ([place]) 06/2012   mild   SVT (supraventricular tachycardia) (HCC)    Urinary urgency    UTI (lower urinary tract infection)    x 2 since June 2016   Wears glasses    Past Surgical History:  Procedure Laterality Date   ABDOMINAL HYSTERECTOMY     ANGIOPLASTY     stent in subclavian LEFT 2016, angioplasty of stent 2017   APPENDECTOMY     AUGMENTATION MAMMAPLASTY Bilateral    replaced silicone with saline 6160   BREAST EXCISIONAL BIOPSY Left    benign 1977   BREAST SURGERY     BIL breast augmentation   CHOLECYSTECTOMY     COLON SURGERY     Sigmoid Colectomy, returned for post op bleeding   COLONOSCOPY W/ POLYPECTOMY     COLONOSCOPY WITH PROPOFOL N/A 05/05/2016   Procedure: COLONOSCOPY WITH PROPOFOL;  Surgeon: Alphonsa Overall, MD;  Location: [facility];  Service: General;  Laterality: N/A;   ESOPHAGOGASTRODUODENOSCOPY (EGD) WITH PROPOFOL N/A 05/05/2016   Procedure: ESOPHAGOGASTRODUODENOSCOPY (EGD) WITH PROPOFOL;  Surgeon: Alphonsa Overall, MD;  Location: [facility];   Service: General;  Laterality: N/A;   EYE SURGERY     eyelid drooping fixed   IR ANGIO INTRA EXTRACRAN SEL COM CAROTID INNOMINATE BILAT MOD SED  10/16/2018   IR ANGIO VERTEBRAL SEL SUBCLAVIAN INNOMINATE UNI L MOD SED  10/16/2018   IR ANGIO VERTEBRAL SEL VERTEBRAL UNI R MOD SED  10/16/2018   IR GENERIC HISTORICAL  12/24/2015   IR ANGIO INTRA EXTRACRAN SEL COM CAROTID INNOMINATE BILAT MOD SED 12/24/2015 Luanne Bras, MD [facility]   IR GENERIC HISTORICAL  12/24/2015   IR ANGIOGRAM EXTREMITY LEFT 12/24/2015 Luanne Bras, MD [facility]   IR GENERIC HISTORICAL  12/24/2015   IR ANGIO VERTEBRAL SEL VERTEBRAL UNI R MOD SED 12/24/2015 Luanne Bras, MD [facility]   IR GENERIC HISTORICAL  01/05/2016   IR PTA NON CORO-LOWER EXTREM 01/05/2016 Luanne Bras, MD [facility]   IR GENERIC HISTORICAL  03/07/2016   IR RADIOLOGIST EVAL & MGMT 03/07/2016 [facility]   JOINT REPLACEMENT Left    thumb   LEFT HEART CATH AND CORONARY ANGIOGRAPHY N/A 11/10/2016   Procedure: Left Heart Cath and Coronary Angiography;  Surgeon: Belva Crome, MD;  Location: [facility];  Service: Cardiovascular;  Laterality: N/A;   MASTOID DEBRIDEMENT     MASTOIDECTOMY REVISION     RADIOLOGY WITH ANESTHESIA N/A 11/09/2014   Procedure: STENT PLACEMENT;  Surgeon: Luanne Bras, MD;  Location: [facility];  Service: Radiology;  Laterality: N/A;   RADIOLOGY WITH ANESTHESIA N/A 05/31/2015   Procedure: RADIOLOGY WITH ANESTHESIA;  Surgeon: Luanne Bras, MD;  Location: [facility];  Service: Radiology;  Laterality: N/A;   RADIOLOGY WITH ANESTHESIA N/A 01/05/2016   Procedure: RADIOLOGY WITH ANESTHESIA ANGIOPLASTY WITH STENTNG;  Surgeon: Luanne Bras, MD;  Location: [facility];  Service: Radiology;  Laterality: N/A;   RADIOLOGY WITH ANESTHESIA N/A 10/16/2018   Procedure: STENTING;  Surgeon: Luanne Bras, MD;  Location: [facility];  Service: Radiology;  Laterality: N/A;   skin cancer excised  04/2013   Melonoma    SPINE SURGERY     cervical fusion, returned to OR for post op  bleeding   TONSILLECTOMY     TONSILLECTOMY     TUBAL LIGATION     VAGINA SURGERY     anterior posterior repair   Family History  Problem Relation Age of Onset   Hyperlipidemia Other    Hypertension Other    Cancer Other        lung, esophagus, stomach   Stroke Other    Heart disease Father    Heart disease Sister    Heart attack Sister    Heart disease Brother    Heart attack Brother    Stroke Son    Social History   Socioeconomic History   Marital status: Divorced    Spouse name: Not on file   Number of children: 2   Years of education: 14   Highest education level: Not on file  Occupational History   Occupation: Optician, 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[employer], retired  Tobacco Use   Smoking status: Former    Packs/day: 2.00    Years: 22.00    Total pack years: 44.00    Types: Cigarettes    Quit date: 04/23/1985    Years since quitting: 36.6   Smokeless tobacco: Never  Vaping Use   Vaping Use: Never used  Substance and Sexual Activity   Alcohol use: Yes    Alcohol/week: 4.0 standard drinks of alcohol    Types: 4 Glasses of wine per week    Comment: 1 drink a  day   Drug use: No   Sexual activity: Yes    Birth control/protection: Surgical  Other Topics Concern   Not on file  Social History Narrative   ** Merged History Encounter **       Domestic partner   Regular exercise-no   Right handed   Caffeine use-- 2 cups coffee daily, rare soda   Social Determinants  of Health   Financial Resource Strain: Low Risk  (11/21/2021)   Overall Financial Resource Strain (CARDIA)    Difficulty of Paying Living Expenses: Not hard at all  Food Insecurity: No Food Insecurity (11/21/2021)   Hunger Vital Sign    Worried About Running Out of Food in the Last Year: Never true    Ran Out of Food in the Last Year: Never true  Transportation Needs: No Transportation Needs (11/21/2021)    PRAPARE - Hydrologist (Medical): No    Lack of Transportation (Non-Medical): No  Physical Activity: Inactive (11/21/2021)   Exercise Vital Sign    Days of Exercise per Week: 0 days    Minutes of Exercise per Session: 0 min  Stress: No Stress Concern Present (11/21/2021)   [place]    Feeling of Stress : Not at all  Social Connections: Moderately Integrated (11/21/2021)   Social Connection and Isolation Panel [NHANES]    Frequency of Communication with Friends and Family: More than three times a week    Frequency of Social Gatherings with Friends and Family: Three times a week    Attends Religious Services: 1 to 4 times per year    Active Member of Clubs or Organizations: Yes    Attends Music therapist: More than 4 times per year    Marital Status: Divorced    Tobacco Counseling Counseling given: Not Answered   Clinical Intake:  Pre-visit preparation completed: Yes  Pain : No/denies pain     Diabetes: No  How often do you need to have someone help you when you read instructions, pamphlets, or other written materials from your doctor or pharmacy?: 1 - Never What is the last grade level you completed in school?: Associate's in nursing  Diabetic?No  Interpreter Needed?: No  Information entered by :: Maryelizabeth Kaufmann, [place] of Daily Living    11/21/2021    3:54 PM  In your present state of health, do you have any difficulty performing the following activities:  Hearing? 0  Vision? 0  Difficulty concentrating or making decisions? 0  Walking or climbing stairs? 0  Dressing or bathing? 0  Doing errands, shopping? 0    Patient Care Team: Binnie Rail, MD as PCP - General (Internal Medicine) Sueanne Margarita, MD as PCP - Cardiology (Cardiology) Charlton Haws, [facility] as Pharmacist (Pharmacist)  Indicate any recent Medical Services you may have  received from other than [facility] providers in the past year (date may be approximate).     Assessment:   This is a routine wellness examination for Adisa.  Hearing/Vision screen No results found.  Dietary issues and exercise activities discussed:     Goals Addressed   None   Depression Screen    11/21/2021    3:50 PM 11/16/2021   10:45 AM 11/11/2020    1:36 PM 09/17/2020    1:43 PM 05/13/2020   10:19 AM 11/11/2019    8:15 AM 07/29/2019   11:35 AM  PHQ 2/9 Scores  PHQ - 2 Score 0 0 0 0 0 0 0  PHQ- 9 Score 0 0 2  0 0     Fall Risk    11/21/2021    3:53 PM 11/16/2021   10:47 AM 09/17/2020    1:45 PM 07/29/2019   11:35 AM 07/29/2019   11:34 AM  Fall Risk   Falls in the past year? 0  1 0 0 0  Number falls in past yr: 0 0 0 0 0  Injury with Fall? 0 0 0 0 0  Risk for fall due to :  No Fall Risks No Fall Risks No Fall Risks No Fall Risks  Follow up  Falls evaluation completed Falls evaluation completed  Falls evaluation completed;Education provided    FALL RISK PREVENTION PERTAINING TO THE HOME:  Any stairs in or around the home? Yes  If so, are there any without handrails? Yes  Home free of loose throw rugs in walkways, pet beds, electrical cords, etc? Yes  Adequate lighting in your home to reduce risk of falls? Yes   ASSISTIVE DEVICES UTILIZED TO PREVENT FALLS:  Life alert? No  Use of a cane, walker or w/c? No  Grab bars in the bathroom? No  Shower chair or bench in shower? No Elevated toilet seat or a handicapped toilet? Yes   TIMED UP AND GO:  Was the test performed? No .  Length of time to ambulate 10 feet:  sec.     Cognitive Function:        11/21/2021    3:53 PM 07/29/2019   11:39 AM  6CIT Screen  What Year? 0 points 0 points  What month? 0 points 0 points  What time? 0 points 0 points  Count back from 20 0 points 0 points  Months in reverse 0 points 0 points  Repeat phrase 0 points 0 points  Total Score 0 points 0 points    Immunizations Immunization  History  Administered Date(s) Administered   Fluad Quad(high Dose 65+) 02/03/2021   Influenza Whole 02/14/2012   Influenza, High Dose Seasonal PF 01/19/2017, 02/08/2018, 01/28/2020   Influenza, Quadrivalent, Recombinant, Inj, Pf 01/18/2019   Influenza,inj,Quad PF,6+ Mos 01/06/2016   Influenza,inj,quad, With Preservative 01/12/2014   Influenza-Unspecified 01/15/2013, 02/03/2015, 01/19/2017   PFIZER Comirnaty(Gray Top)Covid-19 Tri-Sucrose Vaccine 07/31/2020   PFIZER(Purple Top)SARS-COV-2 Vaccination 05/02/2019, 05/23/2019, 02/21/2020   Pfizer Covid-19 Vaccine Bivalent Booster 40yr & up 01/21/2021   Pneumococcal Conjugate-13 04/28/2009, 01/12/2014   Pneumococcal Polysaccharide-23 11/06/2018   Td 04/18/2007   Tdap 06/14/2021   Zoster Recombinat (Shingrix) 01/18/2019, 04/18/2019   Zoster, Live 04/28/2009    TDAP status: Up to date  Flu Vaccine status: Up to date/  Pneumococcal vaccine status: Up to date  Covid-19 vaccine status: Information provided on how to obtain vaccines.   Qualifies for Shingles Vaccine? Yes   Zostavax completed Yes   Shingrix Completed?: Yes  Screening Tests Health Maintenance  Topic Date Due   COVID-19 Vaccine (6 - Pfizer series) 05/24/2021   INFLUENZA VACCINE  07/16/2022 (Originally 11/15/2021)   DEXA SCAN  01/13/2024   TETANUS/TDAP  06/15/2031   Pneumonia Vaccine 76 Years old  Completed   Hepatitis C Screening  Completed   Zoster Vaccines- Shingrix  Completed   HPV VACCINES  Aged Out   COLONOSCOPY (Pts 45-413yrInsurance coverage will need to be confirmed)  Discontinued    Health Maintenance  Health Maintenance Due  Topic Date Due   COVID-19 Vaccine (6 - PfAlthaeries) 05/24/2021    Colorectal cancer screening: No longer required.   Mammogram status: No longer required due to age.  Bone Density status: Completed 01/10/2019. Results reflect: Bone density results: OSTEOPOROSIS. Repeat every 5 years.  Lung Cancer Screening: (Low Dose CT  Chest recommended if Age 692-80ears, 30 pack-year currently smoking OR have quit w/in 15years.) does not qualify.   Lung Cancer Screening Referral:   Additional  Screening:  Hepatitis C Screening: does qualify; Completed 11/11/2020  Vision Screening: Recommended annual ophthalmology exams for early detection of glaucoma and other disorders of the eye. Is the patient up to date with their annual eye exam?  Yes  Who is the provider or what is the name of the office in which the patient attends annual eye exams? Dr. Sherral Hammers If pt is not established with a provider, would they like to be referred to a provider to establish care? Yes .   Dental Screening: Recommended annual dental exams for proper oral hygiene  Community Resource Referral / Chronic Care Management: CRR required this visit?  No   CCM required this visit?  No      Plan:     I have personally reviewed and noted the following in the patient's chart:   Medical and social history Use of alcohol, tobacco or illicit drugs  Current medications and supplements including opioid prescriptions.  Functional ability and status Nutritional status Physical activity Advanced directives List of other physicians Hospitalizations, surgeries, and ER visits in previous 12 months Vitals Screenings to include cognitive, depression, and falls Referrals and appointments  In addition, I have reviewed and discussed with patient certain preventive protocols, quality metrics, and best practice recommendations. A written personalized care plan for preventive services as well as general preventive health recommendations were provided to patient.     Thomes Cake, [facility]   11/21/2021   Nurse Notes: Patient was advised that if anything changes to give our office a call.

## 2021-11-21 NOTE — Patient Instructions (Signed)
It was a pleasure speaking with you today   I'm glad to hear that your foot pain has improved.    Please follow up in 1 year

## 2021-11-22 DIAGNOSIS — Z6826 Body mass index (BMI) 26.0-26.9, adult: Secondary | ICD-10-CM | POA: Diagnosis not present

## 2021-11-22 DIAGNOSIS — D692 Other nonthrombocytopenic purpura: Secondary | ICD-10-CM | POA: Diagnosis not present

## 2021-11-22 DIAGNOSIS — I5030 Unspecified diastolic (congestive) heart failure: Secondary | ICD-10-CM | POA: Diagnosis not present

## 2021-12-13 ENCOUNTER — Other Ambulatory Visit: Payer: Self-pay | Admitting: Internal Medicine

## 2021-12-26 DIAGNOSIS — Z888 Allergy status to other drugs, medicaments and biological substances status: Secondary | ICD-10-CM | POA: Diagnosis not present

## 2021-12-26 DIAGNOSIS — R350 Frequency of micturition: Secondary | ICD-10-CM | POA: Diagnosis not present

## 2021-12-26 DIAGNOSIS — R300 Dysuria: Secondary | ICD-10-CM | POA: Diagnosis not present

## 2021-12-26 DIAGNOSIS — Z886 Allergy status to analgesic agent status: Secondary | ICD-10-CM | POA: Diagnosis not present

## 2021-12-26 DIAGNOSIS — Z885 Allergy status to narcotic agent status: Secondary | ICD-10-CM | POA: Diagnosis not present

## 2021-12-26 DIAGNOSIS — Z87440 Personal history of urinary (tract) infections: Secondary | ICD-10-CM | POA: Diagnosis not present

## 2021-12-26 DIAGNOSIS — M545 Low back pain, unspecified: Secondary | ICD-10-CM | POA: Diagnosis not present

## 2021-12-27 ENCOUNTER — Encounter (HOSPITAL_BASED_OUTPATIENT_CLINIC_OR_DEPARTMENT_OTHER): Payer: Self-pay | Admitting: Cardiology

## 2021-12-27 NOTE — Telephone Encounter (Signed)
Called patient about her message. Patient stated since last week she has been having the occasional irregular heart rate. Patient is concerned that it is A. FIB. Patient stated her HR is between 37-67. Patient stated she has a history of SVT. Patient stated she is fine right now, but she wanted to see what Dr. [name] thought. Made patient an appointment with DOD, Dr. Gasper Sells tomorrow. Patient stated she is also having left lower back pain and taking muscle relaxers and tylenol. Patient does not think this has anything to do with her heart rate. Encouraged patient to keep her appointment tomorrow so we can do an EKG.  Patient verbalized understanding.

## 2021-12-28 ENCOUNTER — Encounter: Payer: Self-pay | Admitting: Internal Medicine

## 2021-12-28 ENCOUNTER — Ambulatory Visit: Payer: PPO | Attending: Internal Medicine

## 2021-12-28 ENCOUNTER — Ambulatory Visit: Payer: PPO | Attending: Internal Medicine | Admitting: Internal Medicine

## 2021-12-28 VITALS — BP 140/60 | HR 64 | Ht 63.0 in | Wt 153.0 lb

## 2021-12-28 DIAGNOSIS — I471 Supraventricular tachycardia: Secondary | ICD-10-CM | POA: Diagnosis not present

## 2021-12-28 DIAGNOSIS — R002 Palpitations: Secondary | ICD-10-CM

## 2021-12-28 DIAGNOSIS — I700 Atherosclerosis of aorta: Secondary | ICD-10-CM

## 2021-12-28 DIAGNOSIS — I10 Essential (primary) hypertension: Secondary | ICD-10-CM | POA: Diagnosis not present

## 2021-12-28 DIAGNOSIS — I5032 Chronic diastolic (congestive) heart failure: Secondary | ICD-10-CM | POA: Diagnosis not present

## 2021-12-28 DIAGNOSIS — I2510 Atherosclerotic heart disease of native coronary artery without angina pectoris: Secondary | ICD-10-CM

## 2021-12-28 DIAGNOSIS — G458 Other transient cerebral ischemic attacks and related syndromes: Secondary | ICD-10-CM | POA: Diagnosis not present

## 2021-12-28 MED ORDER — LOSARTAN POTASSIUM 50 MG PO TABS: 75.0000 mg | ORAL_TABLET | Freq: Every day | ORAL | 3 refills | Status: DC

## 2021-12-28 NOTE — Progress Notes (Unsigned)
Enrolled for [employer] to mail a ZIO XT long term holter monitor to the patients address on file.  

## 2021-12-28 NOTE — Progress Notes (Signed)
Cardiology Office Note:    Date:  12/28/2021   ID:  Susan Gillespie, DOB 01/11/1946, MRN [id number]  PCP:  Binnie Rail, MD   [place] [facility] Cardiologist:  Fransico Him, MD     Referring MD: Binnie Rail, MD   CC: DOD irregular heart beat  History of Present Illness:    Susan Gillespie is a 76 y.o. female with a hx of paroxsymal SVT, left subclavian steal syndrome, CAS, CAD with medical management.  At last visit had only two episodes of SVT after a cruise.  Called in with worsening irregular heart beat.  Patient notes that she is having some issues last week.   Felt like she was skipping more heart beats. Saw Health care team advantage nurse on Monday for a UTI. Yesterday was having heart rate variability with heart rates as low as 35 bpm.  She was not doing anything during this.  With this she felt bit of light headedness. No chest No chest pain or pressure.  No SOB/DOE and no PND/Orthopnea.  No weight gain or leg swelling. No syncope. Bruises easily. No bleeding.  No upcoming surgeries. Had palpitations last week.    AMB was 140s, but yesterday was 130/60  Past Medical History:  Diagnosis Date   Anxiety    Artery stenosis (HCC)    left internal carotid, left subclavian   Arthritis    Bleeding tendency ([place]) 05/16/2012   Hx post op bleeding (epidural hematoma s/p ACDF '10; LLQ hematoma due to superficial vessel fascial bleed post colon resection; Normal platelet count; saw Dr. [name] '14   CAD in native artery    a. mod to mod-severe CAD by cath 10/2016, med rx.   Carotid stenosis, bilateral 08/31/2016   1-39% right and 50-69% lefft ICA stenosis dopplers 04/2019   Chronic diastolic CHF (congestive heart failure) ([place])    Colon polyp    Diverticulitis    Family history of cardiovascular disease    Fracture of right fibula    GERD (gastroesophageal reflux disease)    Headache(784.0)    mirgraines - history of   Hearing aid worn    B/L   HOH  (hard of hearing)    wears hearing aids   Hyperlipidemia    Hypertension    IBS (irritable bowel syndrome)    followed by Dr. [name]   Liver cyst    Melanoma ([place])    removed from back 04/2013   Shortness of breath dyspnea    with exertion   Stroke ([place]) 06/2012   mild   SVT (supraventricular tachycardia) (HCC)    Urinary urgency    UTI (lower urinary tract infection)    x 2 since June 2016   Wears glasses     Past Surgical History:  Procedure Laterality Date   ABDOMINAL HYSTERECTOMY     ANGIOPLASTY     stent in subclavian LEFT 2016, angioplasty of stent 2017   APPENDECTOMY     AUGMENTATION MAMMAPLASTY Bilateral    replaced silicone with saline 2595   BREAST EXCISIONAL BIOPSY Left    benign 1977   BREAST SURGERY     BIL breast augmentation   CHOLECYSTECTOMY     COLON SURGERY     Sigmoid Colectomy, returned for post op bleeding   COLONOSCOPY W/ POLYPECTOMY     COLONOSCOPY WITH PROPOFOL N/A 05/05/2016   Procedure: COLONOSCOPY WITH PROPOFOL;  Surgeon: Alphonsa Overall, MD;  Location: [facility];  Service: General;  Laterality:  N/A;   ESOPHAGOGASTRODUODENOSCOPY (EGD) WITH PROPOFOL N/A 05/05/2016   Procedure: ESOPHAGOGASTRODUODENOSCOPY (EGD) WITH PROPOFOL;  Surgeon: Alphonsa Overall, MD;  Location: [facility];  Service: General;  Laterality: N/A;   EYE SURGERY     eyelid drooping fixed   IR ANGIO INTRA EXTRACRAN SEL COM CAROTID INNOMINATE BILAT MOD SED  10/16/2018   IR ANGIO VERTEBRAL SEL SUBCLAVIAN INNOMINATE UNI L MOD SED  10/16/2018   IR ANGIO VERTEBRAL SEL VERTEBRAL UNI R MOD SED  10/16/2018   IR GENERIC HISTORICAL  12/24/2015   IR ANGIO INTRA EXTRACRAN SEL COM CAROTID INNOMINATE BILAT MOD SED 12/24/2015 Luanne Bras, MD [facility]   IR GENERIC HISTORICAL  12/24/2015   IR ANGIOGRAM EXTREMITY LEFT 12/24/2015 Luanne Bras, MD [facility]   IR GENERIC HISTORICAL  12/24/2015   IR ANGIO VERTEBRAL SEL VERTEBRAL UNI R MOD SED 12/24/2015 Luanne Bras, MD [facility]    IR GENERIC HISTORICAL  01/05/2016   IR PTA NON CORO-LOWER EXTREM 01/05/2016 Luanne Bras, MD [facility]   IR GENERIC HISTORICAL  03/07/2016   IR RADIOLOGIST EVAL & MGMT 03/07/2016 [facility]   JOINT REPLACEMENT Left    thumb   LEFT HEART CATH AND CORONARY ANGIOGRAPHY N/A 11/10/2016   Procedure: Left Heart Cath and Coronary Angiography;  Surgeon: Belva Crome, MD;  Location: [facility];  Service: Cardiovascular;  Laterality: N/A;   MASTOID DEBRIDEMENT     MASTOIDECTOMY REVISION     RADIOLOGY WITH ANESTHESIA N/A 11/09/2014   Procedure: STENT PLACEMENT;  Surgeon: Luanne Bras, MD;  Location: [facility];  Service: Radiology;  Laterality: N/A;   RADIOLOGY WITH ANESTHESIA N/A 05/31/2015   Procedure: RADIOLOGY WITH ANESTHESIA;  Surgeon: Luanne Bras, MD;  Location: [facility];  Service: Radiology;  Laterality: N/A;   RADIOLOGY WITH ANESTHESIA N/A 01/05/2016   Procedure: RADIOLOGY WITH ANESTHESIA ANGIOPLASTY WITH STENTNG;  Surgeon: Luanne Bras, MD;  Location: [facility];  Service: Radiology;  Laterality: N/A;   RADIOLOGY WITH ANESTHESIA N/A 10/16/2018   Procedure: STENTING;  Surgeon: Luanne Bras, MD;  Location: [facility];  Service: Radiology;  Laterality: N/A;   skin cancer excised  04/2013   Melonoma   SPINE SURGERY     cervical fusion, returned to OR for post op  bleeding   TONSILLECTOMY     TONSILLECTOMY     TUBAL LIGATION     VAGINA SURGERY     anterior posterior repair    Current Medications: Current Meds  Medication Sig   ALPRAZolam (XANAX) 0.5 MG tablet TAKE 1/2 TO 1 TABLET BY MOUTH AT BEDTIME AS NEEDED FOR ANXIETY OR SLEEP   aspirin 325 MG tablet Take 325 mg by mouth daily.   cetirizine (ZYRTEC) 10 MG tablet Take 10 mg by mouth every morning.   clopidogrel (PLAVIX) 75 MG tablet TAKE 1 TABLET BY MOUTH EVERY DAY   diltiazem (CARDIZEM CD) 180 MG 24 hr capsule TAKE 1 CAPSULE (180 MG TOTAL) BY MOUTH AT BEDTIME.   estradiol (ESTRACE VAGINAL) 0.1 MG/GM vaginal  cream Place 1 Applicatorful vaginally 2 (two) times a week.   famotidine (PEPCID) 40 MG tablet TAKE 1 TABLET BY MOUTH EVERY DAY   ferrous sulfate 325 (65 FE) MG tablet Take 325 mg by mouth daily with breakfast.   fluticasone (FLONASE) 50 MCG/ACT nasal spray Place into both nostrils daily.   furosemide (LASIX) 20 MG tablet Take 1 tablet (20 mg total) by mouth daily. May take an additional tablet for edema   losartan (COZAAR) 50 MG  tablet Take 1.5 tablets (75 mg total) by mouth daily.   methocarbamol (ROBAXIN) 500 MG tablet Take 1 tablet (500 mg total) by mouth daily as needed for muscle spasms.   metoprolol succinate (TOPROL-XL) 50 MG 24 hr tablet Take 50 mg by mouth as needed (svt). Take with or immediately following a meal. -   mirabegron ER (MYRBETRIQ) 50 MG TB24 tablet Take 1 tablet (50 mg total) by mouth daily.   nitroGLYCERIN (NITROSTAT) 0.4 MG SL tablet Place 1 tablet (0.4 mg total) under the tongue every 5 (five) minutes as needed for chest pain.   potassium chloride (KLOR-CON) 10 MEQ tablet TAKE 1 TABLET BY MOUTH EVERY DAY   rosuvastatin (CRESTOR) 20 MG tablet TAKE 1 TABLET BY MOUTH EVERYDAY AT BEDTIME   SUMAtriptan (IMITREX) 50 MG tablet Take 50 mg by mouth every 2 (two) hours as needed for migraine. May repeat in 2 hours if headache persists or recurs.   traZODone (DESYREL) 100 MG tablet TAKE 1 TABLET BY MOUTH EVERYDAY AT BEDTIME   valACYclovir (VALTREX) 1000 MG tablet Take 1 tablet (1,000 mg total) by mouth 2 (two) times daily. (Patient taking differently: Take 1,000 mg by mouth as needed. shingles)   vitamin B-12 (CYANOCOBALAMIN) 500 MCG tablet Take 500 mcg by mouth daily.   [DISCONTINUED] losartan (COZAAR) 50 MG tablet Take 1 tablet (50 mg total) by mouth daily.     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[employer], retired  Tobacco Use   Smoking status: Former    Packs/day: 2.00    Years: 22.00    Total pack years: 44.00    Types: Cigarettes    Quit date: 04/23/1985    Years since quitting: 36.7   Smokeless tobacco: Never  Vaping Use   Vaping Use: Never used  Substance and Sexual Activity   Alcohol use: Yes    Alcohol/week: 4.0 standard drinks of alcohol    Types: 4 Glasses of wine per week    Comment: 1 drink a  day   Drug use: No   Sexual activity: Yes    Birth control/protection: Surgical  Other Topics Concern   Not on file  Social History Narrative   ** Merged History Encounter **       Domestic partner   Regular exercise-no   Right handed   Caffeine use-- 2 cups coffee daily, rare soda   Social Determinants of Health   Financial Resource Strain: Low Risk  (11/21/2021)   Overall Financial Resource Strain (CARDIA)    Difficulty of Paying Living Expenses: Not hard at all  Food Insecurity: No Food Insecurity (11/21/2021)   Hunger Vital Sign    Worried About Running Out of Food in the Last Year: Never true    Ran Out of Food in the Last Year: Never true  Transportation Needs: No Transportation Needs (11/21/2021)   PRAPARE - Hydrologist (Medical): No    Lack of Transportation (Non-Medical): No  Physical Activity: Inactive (11/21/2021)   Exercise Vital Sign    Days of Exercise per Week: 0 days  Minutes of Exercise per Session: 0 min  Stress: No Stress Concern Present (11/21/2021)   [place]    Feeling of Stress : Not at all  Social Connections: Moderately Integrated (11/21/2021)   Social Connection and Isolation Panel [NHANES]    Frequency of Communication with Friends and Family: More than  three times a week    Frequency of Social Gatherings with Friends and Family: Three times a week    Attends Religious Services: 1 to 4 times per year    Active Member of Clubs or Organizations: Yes    Attends Music therapist: More than 4 times per year    Marital Status: Divorced     Family History: The patient's family history includes Cancer in an other family member; Heart attack in her brother and sister; Heart disease in her brother, father, and sister; Hyperlipidemia in an other family member; Hypertension in an other family member; Stroke in her son and another family member.  ROS:   Please see the history of present illness.     All other systems reviewed and are negative.  EKGs/Labs/Other Studies Reviewed:    The following studies were reviewed today:  EKG:  EKG is  ordered today.  The ekg ordered today demonstrates  12/28/21: SR  LEFT HEART CATH AND CORONARY ANGIOGRAPHY 11/10/2016  Narrative  Moderate to moderately severe three-vessel calcific coronary artery disease, with anatomy and obstruction very similar to those reported on coronary CTA.  Proximal 50-60% LAD, 70% proximal first diagonal.  50-60% proximal to mid circumflex  Ostial 60% and mid 50% RCA stenosis  Normal left ventricular systolic function with elevated end-diastolic pressure consistent with chronic diastolic heart failure.  RECOMMENDATIONS:   Aggressive risk factor modification  Medical therapy of angina pectoris which is likely coming from the diagonal.   GATED SPECT MYO PERF W/LEXISCAN STRESS 1D 06/14/2015  Narrative  Nuclear stress EF: 63%.  There was no ST segment deviation noted during stress.  No T wave inversion was noted during stress.  The study is normal.  This is a low risk study.  Low risk stress nuclear study with normal perfusion and normal left ventricular regional and global systolic function.   ECHO COMPLETE WO IMAGING ENHANCING AGENT  11/27/2016  Narrative *[facility]* [address], [place] [address] [phone]  ------------------------------------------------------------------- Transthoracic Echocardiography  Patient:    Kaysa, Roulhac MR #:       [id number] Study Date: 11/27/2016 Gender:     F Age:        46 Height:     160 cm Weight:     72.4 kg BSA:        1.81 m^2 Pt. Status: Room:  ATTENDING    Mertie Moores, M.D. ORDERING     Dunn, Dayna N REFERRING    Dunn, Dayna N PERFORMING   [facility], Outpatient SONOGRAPHER  [employer], RDCS  cc:  ------------------------------------------------------------------- LV EF: 60% -   65%  ------------------------------------------------------------------- Indications:      CAD (I25.10).  ------------------------------------------------------------------- History:   PMH:   Dyspnea.  Coronary artery disease.  Congestive heart failure.  Stroke.  Risk factors:  SVT, Bilateral Breast Augmentation. Family history of coronary artery disease. Former tobacco use. Hypertension. Dyslipidemia.  ------------------------------------------------------------------- Study Conclusions  - Left ventricle: The cavity size was normal. Wall thickness was normal. Systolic function was normal. The estimated ejection fraction was in the range of 60% to 65%. Wall  motion was normal; there were no regional wall motion abnormalities. Features are consistent with a pseudonormal left ventricular filling pattern, with concomitant abnormal relaxation and increased filling pressure (grade 2 diastolic dysfunction). - Mitral valve: Valve area by continuity equation (using LVOT flow): 2.07 cm^2.  ------------------------------------------------------------------- Study data:  Comparison was made to the study of 06/29/2012.  Study status:  Routine.  Procedure:  Transthoracic echocardiography. Image quality was adequate.          Transthoracic echocardiography.   M-mode, complete 2D, spectral Doppler, and color Doppler.  Birthdate:  Patient birthdate: 10-19-1945.  Age:  Patient is 76 yr old.  Sex:  Gender: female.    BMI: 28.3 kg/m^2.  Blood pressure:     116/70  Patient status:  Outpatient.  Study date: Study date: 11/27/2016. Study time: 10:28 AM.  Location:   [facility]  -------------------------------------------------------------------  ------------------------------------------------------------------- Left ventricle:  The cavity size was normal. Wall thickness was normal. Systolic function was normal. The estimated ejection fraction was in the range of 60% to 65%. Wall motion was normal; there were no regional wall motion abnormalities. Features are consistent with a pseudonormal left ventricular filling pattern, with concomitant abnormal relaxation and increased filling pressure (grade 2 diastolic dysfunction).  ------------------------------------------------------------------- Aortic valve:   Structurally normal valve.   Cusp separation was normal.  Doppler:  Transvalvular velocity was within the normal range. There was no stenosis. There was no regurgitation.  ------------------------------------------------------------------- Aorta:  Aortic root: The aortic root was normal in size. Ascending aorta: The ascending aorta was normal in size.  ------------------------------------------------------------------- Mitral valve:   Structurally normal valve.   Leaflet separation was normal.  Doppler:  Transvalvular velocity was within the normal range. There was no evidence for stenosis. There was no regurgitation.    Valve area by continuity equation (using LVOT flow): 2.07 cm^2. Indexed valve area by continuity equation (using LVOT flow): 1.14 cm^2/m^2.    Mean gradient (D): 2 mm Hg. Peak gradient (D): 4 mm Hg.  ------------------------------------------------------------------- Left atrium:  The atrium was normal in  size.  ------------------------------------------------------------------- Right ventricle:  The cavity size was normal. Systolic function was normal.  ------------------------------------------------------------------- Pulmonic valve:    The valve appears to be grossly normal. Doppler:  There was no significant regurgitation.  ------------------------------------------------------------------- Tricuspid valve:   The valve appears to be grossly normal. Doppler:  There was mild regurgitation.  ------------------------------------------------------------------- Right atrium:  The atrium was normal in size.  ------------------------------------------------------------------- Pericardium:  There was no pericardial effusion.  ------------------------------------------------------------------- Measurements  Left ventricle                           Value          Reference LV ID, ED, PLAX chordal                  46.3  mm       43 - 52 LV ID, ES, PLAX chordal                  24    mm       23 - 38 LV fx shortening, PLAX chordal           48    %        >=29 LV PW thickness, ED                      7.81  mm       ----------  IVS/LV PW ratio, ED                      0.77           <=1.3 Stroke volume, 2D                        89    ml       ---------- Stroke volume/bsa, 2D                    49    ml/m^2   ---------- LV e&', lateral                           7.72  cm/s     ---------- LV E/e&', lateral                         13.34          ---------- LV e&', medial                            7.62  cm/s     ---------- LV E/e&', medial                          13.52          ---------- LV e&', average                           7.67  cm/s     ---------- LV E/e&', average                         13.43          ----------  Ventricular septum                       Value          Reference IVS thickness, ED                        6.01  mm       ----------  LVOT                                      Value          Reference LVOT ID, S                               21    mm       ---------- LVOT area                                3.46  cm^2     ---------- LVOT peak velocity, S                    102   cm/s     ---------- LVOT mean velocity, S                    64.8  cm/s     ----------  LVOT VTI, S                              25.7  cm       ----------  Aorta                                    Value          Reference Aortic root ID, ED                       29    mm       ---------- Ascending aorta ID, A-P, S               29    mm       ----------  Left atrium                              Value          Reference LA ID, A-P, ES                           39    mm       ---------- LA ID/bsa, A-P                           2.15  cm/m^2   <=2.2 LA volume, S                             48.7  ml       ---------- LA volume/bsa, S                         26.8  ml/m^2   ---------- LA volume, ES, 1-p A4C                   45.2  ml       ---------- LA volume/bsa, ES, 1-p A4C               24.9  ml/m^2   ---------- LA volume, ES, 1-p A2C                   50.9  ml       ---------- LA volume/bsa, ES, 1-p A2C               28    ml/m^2   ----------  Mitral valve                             Value          Reference Mitral E-wave peak velocity              103   cm/s     ---------- Mitral A-wave peak velocity              120   cm/s     ---------- Mitral mean velocity, D                  64.7  cm/s     ---------- Mitral deceleration time         (  H)     458   ms       150 - 230 Mitral mean gradient, D                  2     mm Hg    ---------- Mitral peak gradient, D                  4     mm Hg    ---------- Mitral E/A ratio, peak                   0.9            ---------- Mitral valve area, LVOT                  2.07  cm^2     ---------- continuity Mitral valve area/bsa, LVOT              1.14  cm^2/m^2 ---------- continuity Mitral annulus VTI, D                    43    cm        ----------  Tricuspid valve                          Value          Reference Tricuspid regurg peak velocity           268   cm/s     ---------- Tricuspid peak RV-RA gradient            29    mm Hg    ----------  Right atrium                             Value          Reference RA ID, S-I, ES, A4C                      46.7  mm       34 - 49 RA area, ES, A4C                         14.7  cm^2     8.3 - 19.5 RA volume, ES, A/L                       38.4  ml       ---------- RA volume/bsa, ES, A/L                   21.2  ml/m^2   ----------  Right ventricle                          Value          Reference TAPSE                                    28.6  mm       ---------- RV s&', lateral, S                        14.6  cm/s     ----------  Legend: (L)  and  (  H)  mark values outside specified reference range.  ------------------------------------------------------------------- Prepared and Electronically Authenticated by  Mertie Moores, M.D. 2018-08-13T15:19:57    CT CORONARY MORPH W/CTA COR W/SCORE W/CA W/CM &/OR WO/CM 10/26/2016  Addendum 10/26/2016 11:25 AM ADDENDUM REPORT: 10/26/2016 11:23  CLINICAL DATA:  Chest pain  EXAM: Cardiac CTA  MEDICATIONS: Sub lingual nitro. '4mg'$  and lopressor '5mg'$   TECHNIQUE: The patient was scanned on a [employer] 192 scanner. Gantry rotation speed was 250 msecs. Collimation was.6 mm . A 120 kV prospective scan was triggered in the ascending thoracic aorta at 140 HU's Full mA between 35-75% of the R-R interval. Average HR during the scan was 54 bpm. The 3D data set was interpreted on a dedicated work station using MPR, MIP and VRT modes. A total of 80cc of contrast was used.  FINDINGS: Non-cardiac: See separate report from [facility]. No significant findings on limited lung and soft tissue windows.  Calcium Score:  Dense 3 vessel calcium score 645  Coronary Arteries: Right dominant with no anomalies  LM:  LAD: Severely  calcified proximal and mid vessel 50% calcified stenosis proximal 50-75% mid vessel at D1 take off  D1:  Less than 50% calcified disease in proximal vessel  IM:  Small vessel normal  Circumflex:  Less than 50% calcified disease proximal and mid vessel  OM1:  50% calcified disease in ostium  OM2:  Normal  RCA: Calcified ostium. Less than 50% calcified disease in proximal vessel 50-75% calcified stenosis in mid vessel  PDA:  Normal  PLA:  Normal  IMPRESSION: 1) Calcium score 645 92 nd percentile for age and sex  2) Significant calcific 3V CAD. Possibly hemodynamically significant mid RCA and mid LAD stenosis see above Study will be sent for FFR CT  Jenkins Rouge   Electronically Signed By: Jenkins Rouge M.D. On: 10/26/2016 11:23  Narrative EXAM: OVER-READ INTERPRETATION  CT CHEST  The following report is an over-read performed by radiologist Dr. Rebekah [facility] on 10/26/2016. This over-read does not include interpretation of cardiac or coronary anatomy or pathology. The coronary calcium score/coronary CTA interpretation by the cardiologist is attached.  COMPARISON:  None.  FINDINGS: Aortic atherosclerosis. Within the visualized portions of the thorax there are no suspicious appearing pulmonary nodules or masses, there is no acute consolidative airspace disease, no pleural effusions, no pneumothorax and no lymphadenopathy. Visualized portions of the upper abdomen are unremarkable. There are no aggressive appearing lytic or blastic lesions noted in the visualized portions of the skeleton. Bilateral breast implants incidentally noted.  IMPRESSION: 1. Aortic atherosclerosis. Aortic Atherosclerosis (ICD10-I70.0).  Electronically Signed: By: Vinnie Langton M.D. On: 10/26/2016 10:38   CT CORONARY FRACTIONAL FLOW RESERVE DATA PREP 10/26/2016  Addendum 10/30/2016  8:41 AM ADDENDUM REPORT: 10/30/2016 08:38  ADDENDUM: CLINICAL  DATA: Chest pain  EXAM: Cardiac CTA  MEDICATIONS: Sub lingual nitro. '4mg'$  and lopressor '5mg'$   TECHNIQUE: The patient was scanned on a [employer] 192 scanner. Gantry rotation speed was 250 msecs. Collimation was.6 mm . A 120 kV prospective scan was triggered in the ascending thoracic aorta at 140 HU's Full mA between 35-75% of the R-R interval. Average HR during the scan was 54 bpm. The 3D data set was interpreted on a dedicated work station using MPR, MIP and VRT modes. A total of 80cc of contrast was used.  FINDINGS: Non-cardiac: See separate report from [facility]. No significant findings on limited lung and soft tissue windows.  Calcium Score: Dense 3 vessel calcium  score 645  Coronary Arteries: Right dominant with no anomalies  LM:  LAD: Severely calcified proximal and mid vessel 50% calcified stenosis proximal 50-75% mid vessel at D1 take off  D1: Less than 50% calcified disease in proximal vessel  IM: Small vessel normal  Circumflex: Less than 50% calcified disease proximal and mid vessel  OM1: 50% calcified disease in ostium  OM2: Normal  RCA: Calcified ostium. Less than 50% calcified disease in proximal vessel 50-75% calcified stenosis in mid vessel  PDA: Normal  PLA: Normal  IMPRESSION: 1) Calcium score 645 92 nd percentile for age and sex  2) Significant calcific 3V CAD. Possibly hemodynamically significant mid RCA and mid LAD stenosis see above Study will be sent for FFR CT  Jenkins Rouge   Electronically Signed By: Jenkins Rouge M.D. On: 10/30/2016 08:38  Narrative EXAM: OVER-READ INTERPRETATION CT CHEST  The following report is an over-read performed by radiologist Dr. Vinnie Langton of [facility], [place] on 10/26/2016 . This over-read does not include interpretation of cardiac or coronary anatomy or pathology. The coronary calcium score/coronary CTA interpretation by the cardiologist is  attached.  COMPARISON: None.  FINDINGS: Aortic atherosclerosis. Within the visualized portions of the thorax there are no suspicious appearing pulmonary nodules or masses, there is no acute consolidative airspace disease, no pleural effusions, no pneumothorax and no lymphadenopathy. Visualized portions of the upper abdomen are unremarkable. There are no aggressive appearing lytic or blastic lesions noted in the visualized portions of the skeleton. Bilateral breast implants incidentally noted.  IMPRESSION: 1.  Aortic atherosclerosis. Aortic Atherosclerosis (ICD10-I70.0).  Electronically Signed: By: Vinnie Langton M.D. On: 10/30/2016 08:31   No results found for this or any previous visit from the past 3650 days.   VAS US CAROTID DUPLEX BILATERAL 09/14/2021  Narrative Carotid Arterial Duplex Study  Patient Name:  Susan Gillespie  Date of Exam:   09/14/2021 Medical Rec #: [id number]      Accession #:    [id number] Date of Birth: Aug 22, 1945     Patient Gender: F Patient Age:   50 years Exam Location:  [facility] Procedure:      VAS US CAROTID Referring Phys: Luanne Bras   --------------------------------------------------------------------------------  Indications:       Carotid stenosis. Risk Factors:      Hypertension, hyperlipidemia, coronary artery disease. Comparison Study:  02/11/2021 - Right Carotid: Velocities in the right ICA are consistent with a 1-39% stenosis.  Left Carotid: Velocities in the left ICA are consistent with a 60-79% stenosis. The ECA appears >50% stenosed.  Vertebrals: Right vertebral artery demonstrates antegrade flow. Left vertebral artery demonstrates retrograde flow.  Subclavians: Left subclavian artery flow was disturbed. Normal flow hemodynamics were seen in the right subclavian artery.  Performing Technologist: Oliver Hum RVT   Examination Guidelines: A complete evaluation includes B-mode imaging,  spectral Doppler, color Doppler, and power Doppler as needed of all accessible portions of each vessel. Bilateral testing is considered an integral part of a complete examination. Limited examinations for reoccurring indications may be performed as noted.   Right Carotid Findings: +----------+--------+--------+--------+-----------------------+--------+           PSV cm/sEDV cm/sStenosisPlaque Description     Comments +----------+--------+--------+--------+-----------------------+--------+ CCA Prox  100     11                                              +----------+--------+--------+--------+-----------------------+--------+  CCA Distal87      11              smooth and heterogenous         +----------+--------+--------+--------+-----------------------+--------+ ICA Prox  146     24              calcific                        +----------+--------+--------+--------+-----------------------+--------+ ICA Distal82      19                                     tortuous +----------+--------+--------+--------+-----------------------+--------+ ECA       156     21                                              +----------+--------+--------+--------+-----------------------+--------+  +----------+--------+-------+--------+-------------------+           PSV cm/sEDV cmsDescribeArm Pressure (mmHG) +----------+--------+-------+--------+-------------------+ [id number]                                        +----------+--------+-------+--------+-------------------+  +---------+--------+--+--------+-+---------+ VertebralPSV cm/s50EDV cm/s8Antegrade +---------+--------+--+--------+-+---------+    Left Carotid Findings: +----------+--------+--------+--------+-----------------------+--------+           PSV cm/sEDV cm/sStenosisPlaque Description      Comments +----------+--------+--------+--------+-----------------------+--------+ CCA Prox  99      19              smooth and heterogenous         +----------+--------+--------+--------+-----------------------+--------+ CCA Distal68      11              smooth and heterogenous         +----------+--------+--------+--------+-----------------------+--------+ ICA Prox  339     72      60-79%  calcific                        +----------+--------+--------+--------+-----------------------+--------+ ICA Mid   196     46      40-59%  smooth and heterogenous         +----------+--------+--------+--------+-----------------------+--------+ ICA Distal72      17                                     tortuous +----------+--------+--------+--------+-----------------------+--------+ ECA       108     13                                              +----------+--------+--------+--------+-----------------------+--------+  +----------+--------+--------+--------+-------------------+           PSV cm/sEDV cm/sDescribeArm Pressure (mmHG) +----------+--------+--------+--------+-------------------+ [id number]                                         +----------+--------+--------+--------+-------------------+  +---------+--------+--+--------+--+---------+ VertebralPSV cm/s57EDV cm/s13Antegrade +---------+--------+--+--------+--+---------+      Summary: Right Carotid: Velocities in the right ICA  are consistent with a 1-39% stenosis.  Left Carotid: Velocities in the left ICA are consistent with a 60-79% stenosis.  Vertebrals: Bilateral vertebral arteries demonstrate antegrade flow.  *See table(s) above for measurements and observations.    Electronically signed by Antony Contras MD on 09/14/2021 at 4:57:22 PM.    Final     Recent Labs: 11/16/2021: ALT 15; BUN 19; Creatinine, Ser 1.02; Hemoglobin 12.6; Platelets 278.0; Potassium 4.5;  Sodium 138  Recent Lipid Panel    Component Value Date/Time   CHOL 179 11/16/2021 1134   CHOL 165 05/29/2019 1402   TRIG 151.0 (H) 11/16/2021 1134   HDL 81.50 11/16/2021 1134   HDL 84 05/29/2019 1402   CHOLHDL 2 11/16/2021 1134   VLDL 30.2 11/16/2021 1134   LDLCALC 67 11/16/2021 1134   LDLCALC 65 11/11/2019 0856   LDLDIRECT 50.0 10/30/2018 0852     Risk Assessment/Calculations:    HYPERTENSION CONTROL Vitals:   12/28/21 1037 12/28/21 1111  BP: (!) 140/60 (!) 140/60    The patient's blood pressure is elevated above target today.  In order to address the patient's elevated BP: A current anti-hypertensive medication was adjusted today.          Physical Exam:    VS:  BP (!) 140/60   Pulse 64   Ht '5\' 3"'$  (1.6 m)   Wt 153 lb (69.4 kg)   LMP  (LMP Unknown)   SpO2 96%   BMI 27.10 kg/m     BP's on R arm  Wt Readings from Last 3 Encounters:  12/28/21 153 lb (69.4 kg)  11/16/21 151 lb (68.5 kg)  07/06/21 154 lb 6.4 oz (70 kg)    Gen: No distress,    Neck: No JVD,  carotid bruit L sided Cardiac: No Rubs or Gallops, no murmur, RRR +1 left radial pulse Respiratory: Clear to auscultation bilaterally, normal effort, normal  respiratory rate GI: Soft, nontender, non-distended  MS: No  edema;  moves all extremities Integument: Skin feels warm Neuro:  At time of evaluation, alert and oriented to person/place/time/situation  Psych: Normal affect, patient feels well  ASSESSMENT:    1. Palpitations   2. Paroxysmal supraventricular tachycardia ([place])   3. Essential hypertension, benign   4. Chronic diastolic CHF (congestive heart failure), NYHA class 1 ([place])   5. Aortic atherosclerosis (H[facility])   6. Subclavian steal syndrome   7. CAD in native artery    PLAN:    PSVT with new palpitations - with fast and slow heart beats - will do 7 day non live ziopatch - continue current AV nodal agents - we had discussed both the Apple watch and the [place]  HTN HFpEF -  continue lasix and K but increase losartan to 75 mg PO daily and BMP  CAD CAS Aortic atheroscleros L subclavian steal syndrome - seen by IR, on DAPT On current crestor LDL 57 - R arm BP used; on high dose ASA and on plavix per IR; no barriers to start [facility] if new AF is found; we will stop DAPT and start DOAC if AF  March follow up with Dr. [name] or APP  Medication Adjustments/Labs and Tests Ordered: Current medicines are reviewed at length with the patient today.  Concerns regarding medicines are outlined above.  Orders Placed This Encounter  Procedures   Basic metabolic panel   LONG TERM MONITOR (3-14 DAYS)   EKG 12-Lead   Meds ordered this encounter  Medications   losartan (COZAAR) 50 MG tablet  Sig: Take 1.5 tablets (75 mg total) by mouth daily.    Dispense:  135 tablet    Refill:  3    Pt will request med when ready; please do not fill at this time    Patient Instructions  Medication Instructions:  Your physician has recommended you make the following change in your medication:  INCREASE: losartan to 75 mg by mouth once daily (1.5 tablets)  *If you need a refill on your cardiac medications before your next appointment, please call your pharmacy*   Lab Work: IN 1-2 WEEKS: BMP If you have labs (blood work) drawn today and your tests are completely normal, you will receive your results only by: [place] (if you have MyChart) OR A paper copy in the mail If you have any lab test that is abnormal or we need to change your treatment, we will call you to review the results.   Testing/Procedures: Your physician has requested that you wear a 7 day heart monitor.    Follow-Up: At [facility], you and your health needs are our priority.  As part of our continuing mission to provide you with exceptional heart care, we have created designated Provider Care Teams.  These Care Teams include your primary Cardiologist (physician) and Advanced Practice Providers  (APPs -  Physician Assistants and Nurse Practitioners) who all work together to provide you with the care you need, when you need it.    Your next appointment:   6 month(s)  The format for your next appointment:   In Person  Provider:   Fransico Him, MD     Other Instructions ZIO XT- Long Term Monitor Instructions  Your physician has requested you wear a ZIO patch monitor for 7 days.  This is a single patch monitor. [employer] supplies one patch monitor per enrollment. Additional stickers are not available. Please do not apply patch if you will be having a Nuclear Stress Test,  Echocardiogram, Cardiac CT, MRI, or Chest Xray during the period you would be wearing the  monitor. The patch cannot be worn during these tests. You cannot remove and re-apply the  ZIO XT patch monitor.  Your ZIO patch monitor will be mailed 3 day USPS to your address on file. It may take 3-5 days  to receive your monitor after you have been enrolled.  Once you have received your monitor, please review the enclosed instructions. Your monitor  has already been registered assigning a specific monitor serial # to you.  Billing and Patient Assistance Program Information  We have supplied [employer] with any of your insurance information on file for billing purposes. [employer] offers a sliding scale Patient Assistance Program for patients that do not have  insurance, or whose insurance does not completely cover the cost of the ZIO monitor.  You must apply for the Patient Assistance Program to qualify for this discounted rate.  To apply, please call [employer] at [phone], select option 4, select option 2, ask to apply for  Patient Assistance Program. Theodore Demark will ask your household income, and how many people  are in your household. They will quote your out-of-pocket cost based on that information.  [employer] will also be able to set up a 91-month interest-free payment plan if needed.  Applying the monitor   Shave  hair from upper left chest.  Hold abrader disc by orange tab. Rub abrader in 40 strokes over the upper left chest as  indicated in your monitor instructions.  Clean area with 4  enclosed alcohol pads. Let dry.  Apply patch as indicated in monitor instructions. Patch will be placed under collarbone on left  side of chest with arrow pointing upward.  Rub patch adhesive wings for 2 minutes. Remove white label marked "1". Remove the white  label marked "2". Rub patch adhesive wings for 2 additional minutes.  While looking in a mirror, press and release button in center of patch. A small green light will  flash 3-4 times. This will be your only indicator that the monitor has been turned on.  Do not shower for the first 24 hours. You may shower after the first 24 hours.  Press the button if you feel a symptom. You will hear a small click. Record Date, Time and  Symptom in the Patient Logbook.  When you are ready to remove the patch, follow instructions on the last 2 pages of Patient  Logbook. Stick patch monitor onto the last page of Patient Logbook.  Place Patient Logbook in the blue and white box. Use locking tab on box and tape box closed  securely. The blue and white box has prepaid postage on it. Please place it in the mailbox as  soon as possible. Your physician should have your test results approximately 7 days after the  monitor has been mailed back to [facility].  Call [place] at [phone] if you have questions regarding  your ZIO XT patch monitor. Call them immediately if you see an orange light blinking on your  monitor.  If your monitor falls off in less than 4 days, contact our Monitor department at [phone].  If your monitor becomes loose or falls off after 4 days call [employer] at [phone] for  suggestions on securing your monitor   Important Information About Sugar         Signed, Werner Lean, MD  12/28/2021 11:19 AM    [place]

## 2021-12-28 NOTE — Patient Instructions (Signed)
Medication Instructions:  Your physician has recommended you make the following change in your medication:  INCREASE: losartan to 75 mg by mouth once daily (1.5 tablets)  *If you need a refill on your cardiac medications before your next appointment, please call your pharmacy*   Lab Work: IN 1-2 WEEKS: BMP If you have labs (blood work) drawn today and your tests are completely normal, you will receive your results only by: [place] (if you have MyChart) OR A paper copy in the mail If you have any lab test that is abnormal or we need to change your treatment, we will call you to review the results.   Testing/Procedures: Your physician has requested that you wear a 7 day heart monitor.    Follow-Up: At [facility], you and your health needs are our priority.  As part of our continuing mission to provide you with exceptional heart care, we have created designated Provider Care Teams.  These Care Teams include your primary Cardiologist (physician) and Advanced Practice Providers (APPs -  Physician Assistants and Nurse Practitioners) who all work together to provide you with the care you need, when you need it.    Your next appointment:   6 month(s)  The format for your next appointment:   In Person  Provider:   Fransico Him, MD     Other Instructions ZIO XT- Long Term Monitor Instructions  Your physician has requested you wear a ZIO patch monitor for 7 days.  This is a single patch monitor. [employer] supplies one patch monitor per enrollment. Additional stickers are not available. Please do not apply patch if you will be having a Nuclear Stress Test,  Echocardiogram, Cardiac CT, MRI, or Chest Xray during the period you would be wearing the  monitor. The patch cannot be worn during these tests. You cannot remove and re-apply the  ZIO XT patch monitor.  Your ZIO patch monitor will be mailed 3 day USPS to your address on file. It may take 3-5 days  to receive your  monitor after you have been enrolled.  Once you have received your monitor, please review the enclosed instructions. Your monitor  has already been registered assigning a specific monitor serial # to you.  Billing and Patient Assistance Program Information  We have supplied [employer] with any of your insurance information on file for billing purposes. [employer] offers a sliding scale Patient Assistance Program for patients that do not have  insurance, or whose insurance does not completely cover the cost of the ZIO monitor.  You must apply for the Patient Assistance Program to qualify for this discounted rate.  To apply, please call [employer] at [phone], select option 4, select option 2, ask to apply for  Patient Assistance Program. Theodore Demark will ask your household income, and how many people  are in your household. They will quote your out-of-pocket cost based on that information.  [employer] will also be able to set up a 48-month interest-free payment plan if needed.  Applying the monitor   Shave hair from upper left chest.  Hold abrader disc by orange tab. Rub abrader in 40 strokes over the upper left chest as  indicated in your monitor instructions.  Clean area with 4 enclosed alcohol pads. Let dry.  Apply patch as indicated in monitor instructions. Patch will be placed under collarbone on left  side of chest with arrow pointing upward.  Rub patch adhesive wings for 2 minutes. Remove white label marked "1". Remove the white  label marked "2". Rub patch adhesive wings for 2 additional minutes.  While looking in a mirror, press and release button in center of patch. A small green light will  flash 3-4 times. This will be your only indicator that the monitor has been turned on.  Do not shower for the first 24 hours. You may shower after the first 24 hours.  Press the button if you feel a symptom. You will hear a small click. Record Date, Time and  Symptom in the Patient Logbook.  When you  are ready to remove the patch, follow instructions on the last 2 pages of Patient  Logbook. Stick patch monitor onto the last page of Patient Logbook.  Place Patient Logbook in the blue and white box. Use locking tab on box and tape box closed  securely. The blue and white box has prepaid postage on it. Please place it in the mailbox as  soon as possible. Your physician should have your test results approximately 7 days after the  monitor has been mailed back to [facility].  Call [place] at [phone] if you have questions regarding  your ZIO XT patch monitor. Call them immediately if you see an orange light blinking on your  monitor.  If your monitor falls off in less than 4 days, contact our Monitor department at [phone].  If your monitor becomes loose or falls off after 4 days call [employer] at [phone] for  suggestions on securing your monitor   Important Information About Sugar

## 2021-12-29 DIAGNOSIS — R002 Palpitations: Secondary | ICD-10-CM | POA: Diagnosis not present

## 2021-12-29 DIAGNOSIS — I471 Supraventricular tachycardia: Secondary | ICD-10-CM

## 2021-12-30 ENCOUNTER — Encounter (HOSPITAL_BASED_OUTPATIENT_CLINIC_OR_DEPARTMENT_OTHER): Payer: Self-pay | Admitting: Cardiology

## 2021-12-30 DIAGNOSIS — N390 Urinary tract infection, site not specified: Secondary | ICD-10-CM | POA: Diagnosis not present

## 2021-12-30 MED ORDER — LOSARTAN POTASSIUM 25 MG PO TABS: 75.0000 mg | ORAL_TABLET | Freq: Every day | ORAL | 3 refills | Status: DC

## 2022-01-04 ENCOUNTER — Ambulatory Visit: Payer: PPO | Attending: Internal Medicine

## 2022-01-04 DIAGNOSIS — I10 Essential (primary) hypertension: Secondary | ICD-10-CM

## 2022-01-05 ENCOUNTER — Other Ambulatory Visit: Payer: Self-pay | Admitting: Internal Medicine

## 2022-01-05 DIAGNOSIS — E785 Hyperlipidemia, unspecified: Secondary | ICD-10-CM

## 2022-01-05 DIAGNOSIS — I5032 Chronic diastolic (congestive) heart failure: Secondary | ICD-10-CM

## 2022-01-05 LAB — BASIC METABOLIC PANEL
BUN/Creatinine Ratio: 14 (ref 12–28)
BUN: 13 mg/dL (ref 8–27)
CO2: 23 mmol/L (ref 20–29)
Calcium: 9.6 mg/dL (ref 8.7–10.3)
Chloride: 103 mmol/L (ref 96–106)
Creatinine, Ser: 0.91 mg/dL (ref 0.57–1.00)
Glucose: 104 mg/dL — ABNORMAL HIGH (ref 70–99)
Potassium: 4.4 mmol/L (ref 3.5–5.2)
Sodium: 141 mmol/L (ref 134–144)
eGFR: 65 mL/min/{1.73_m2} (ref >59)

## 2022-01-09 ENCOUNTER — Other Ambulatory Visit: Payer: Self-pay | Admitting: Internal Medicine

## 2022-01-09 DIAGNOSIS — I10 Essential (primary) hypertension: Secondary | ICD-10-CM

## 2022-01-10 DIAGNOSIS — R002 Palpitations: Secondary | ICD-10-CM | POA: Diagnosis not present

## 2022-01-10 DIAGNOSIS — I471 Supraventricular tachycardia: Secondary | ICD-10-CM | POA: Diagnosis not present

## 2022-01-11 DIAGNOSIS — H353121 Nonexudative age-related macular degeneration, left eye, early dry stage: Secondary | ICD-10-CM | POA: Diagnosis not present

## 2022-01-11 DIAGNOSIS — M533 Sacrococcygeal disorders, not elsewhere classified: Secondary | ICD-10-CM | POA: Diagnosis not present

## 2022-01-11 DIAGNOSIS — H353112 Nonexudative age-related macular degeneration, right eye, intermediate dry stage: Secondary | ICD-10-CM | POA: Diagnosis not present

## 2022-01-16 ENCOUNTER — Telehealth: Payer: Self-pay | Admitting: Internal Medicine

## 2022-01-16 DIAGNOSIS — I471 Supraventricular tachycardia, unspecified: Secondary | ICD-10-CM

## 2022-01-16 DIAGNOSIS — R002 Palpitations: Secondary | ICD-10-CM

## 2022-01-16 NOTE — Telephone Encounter (Signed)
Left a message to call back.

## 2022-01-16 NOTE — Telephone Encounter (Signed)
Results: Paroxsymal SVT Plan: Offer increase in diltiazem to 240 mg Po Daily   Werner Lean, MD  Pt aware and would prefer to remain on same dose as currently taking Per pt Losartan recently increased to 75 mg and have not seen much change systolic remains in the 542-706'C . Will forward to Dr Gasper Sells for review .Adonis Housekeeper

## 2022-01-16 NOTE — Telephone Encounter (Signed)
Pt aware of recommendations and agrees with plan Per pt has several 25 mg and 50 mg Losartan tabs will use these up before needing to call a new script in [place]

## 2022-01-16 NOTE — Telephone Encounter (Signed)
Pt returning call for results

## 2022-01-18 MED ORDER — LOSARTAN POTASSIUM 100 MG PO TABS: 100.0000 mg | ORAL_TABLET | Freq: Every day | ORAL | 3 refills | Status: DC

## 2022-01-18 NOTE — Addendum Note (Signed)
Addended by: Precious Gilding on: 01/18/2022 08:35 AM   Modules accepted: Orders

## 2022-01-18 NOTE — Telephone Encounter (Signed)
Order placed for losartan 100 mg per MD note.

## 2022-01-25 ENCOUNTER — Ambulatory Visit: Payer: PPO | Attending: Cardiology

## 2022-01-25 DIAGNOSIS — R002 Palpitations: Secondary | ICD-10-CM | POA: Diagnosis not present

## 2022-01-25 DIAGNOSIS — I471 Supraventricular tachycardia, unspecified: Secondary | ICD-10-CM

## 2022-01-26 LAB — BASIC METABOLIC PANEL
BUN/Creatinine Ratio: 16 (ref 12–28)
BUN: 16 mg/dL (ref 8–27)
CO2: 23 mmol/L (ref 20–29)
Calcium: 9.4 mg/dL (ref 8.7–10.3)
Chloride: 105 mmol/L (ref 96–106)
Creatinine, Ser: 1.02 mg/dL — ABNORMAL HIGH (ref 0.57–1.00)
Glucose: 98 mg/dL (ref 70–99)
Potassium: 4.6 mmol/L (ref 3.5–5.2)
Sodium: 143 mmol/L (ref 134–144)
eGFR: 57 mL/min/{1.73_m2} — ABNORMAL LOW (ref >59)

## 2022-03-06 ENCOUNTER — Encounter: Payer: Self-pay | Admitting: Internal Medicine

## 2022-03-07 DIAGNOSIS — M25531 Pain in right wrist: Secondary | ICD-10-CM | POA: Diagnosis not present

## 2022-04-03 ENCOUNTER — Other Ambulatory Visit (HOSPITAL_COMMUNITY): Payer: Self-pay | Admitting: Interventional Radiology

## 2022-04-03 DIAGNOSIS — I771 Stricture of artery: Secondary | ICD-10-CM

## 2022-04-20 ENCOUNTER — Other Ambulatory Visit: Payer: Self-pay | Admitting: Internal Medicine

## 2022-04-20 ENCOUNTER — Other Ambulatory Visit: Payer: Self-pay | Admitting: Cardiology

## 2022-04-20 DIAGNOSIS — G4709 Other insomnia: Secondary | ICD-10-CM

## 2022-04-24 ENCOUNTER — Encounter: Payer: Self-pay | Admitting: Internal Medicine

## 2022-04-24 ENCOUNTER — Other Ambulatory Visit: Payer: Self-pay

## 2022-04-24 ENCOUNTER — Other Ambulatory Visit: Payer: Self-pay | Admitting: Internal Medicine

## 2022-04-24 DIAGNOSIS — Z1231 Encounter for screening mammogram for malignant neoplasm of breast: Secondary | ICD-10-CM

## 2022-04-24 DIAGNOSIS — K219 Gastro-esophageal reflux disease without esophagitis: Secondary | ICD-10-CM

## 2022-04-24 MED ORDER — FAMOTIDINE 40 MG PO TABS: 40.0000 mg | ORAL_TABLET | Freq: Every day | ORAL | 2 refills | Status: DC

## 2022-04-25 DIAGNOSIS — D225 Melanocytic nevi of trunk: Secondary | ICD-10-CM | POA: Diagnosis not present

## 2022-04-25 DIAGNOSIS — Z1283 Encounter for screening for malignant neoplasm of skin: Secondary | ICD-10-CM | POA: Diagnosis not present

## 2022-04-25 DIAGNOSIS — Z85820 Personal history of malignant melanoma of skin: Secondary | ICD-10-CM | POA: Diagnosis not present

## 2022-04-25 DIAGNOSIS — Z08 Encounter for follow-up examination after completed treatment for malignant neoplasm: Secondary | ICD-10-CM | POA: Diagnosis not present

## 2022-04-28 ENCOUNTER — Ambulatory Visit (HOSPITAL_COMMUNITY)
Admission: RE | Admit: 2022-04-28 | Discharge: 2022-04-28 | Disposition: A | Discharge status: Home / Self Care | Payer: PPO | Source: Ambulatory Visit | Attending: Interventional Radiology | Admitting: Interventional Radiology

## 2022-04-28 DIAGNOSIS — I771 Stricture of artery: Secondary | ICD-10-CM

## 2022-05-04 ENCOUNTER — Telehealth (HOSPITAL_COMMUNITY): Payer: Self-pay

## 2022-05-04 NOTE — Telephone Encounter (Signed)
Pt agreed to f/u in 6 months with us carotid. AW 

## 2022-05-12 ENCOUNTER — Other Ambulatory Visit: Payer: Self-pay | Admitting: Internal Medicine

## 2022-05-23 ENCOUNTER — Encounter: Payer: Self-pay | Admitting: Internal Medicine

## 2022-05-23 NOTE — Patient Instructions (Addendum)
      Blood work was ordered.   The lab is on the first floor.    Medications changes include :       A referral was ordered for XXX.     Someone will call you to schedule an appointment.    Return in about 6 months (around 11/22/2022) for Physical Exam.

## 2022-05-23 NOTE — Progress Notes (Unsigned)
Subjective:    Patient ID: Susan Gillespie, female    DOB: 1945-06-30, 77 y.o.   MRN: [id number]     HPI Bethanny is here for follow up of her chronic medical problems, including CAD, CHF, htn, hld, prediabetes, neck pain, OA, IBS, GERD, bladder spasms, insomnia, anxiety  Uses estrace BIW, taking myrbetriq daily.  Has some urgency at times, incontinence at times and bladder spasms.  She does get frustrated by her boyfriend who does not want to do as much as she wants to do on the weekends when he is visiting.  She is staying active.    Medications and allergies reviewed with patient and updated if appropriate.  Current Outpatient Medications on File Prior to Visit  Medication Sig Dispense Refill   ALPRAZolam (XANAX) 0.5 MG tablet TAKE 1/2 TO 1 TABLET BY MOUTH AT BEDTIME AS NEEDED FOR ANXIETY OR SLEEP 30 tablet 2   aspirin 325 MG tablet Take 325 mg by mouth daily.     cetirizine (ZYRTEC) 10 MG tablet Take 10 mg by mouth every morning.     clopidogrel (PLAVIX) 75 MG tablet TAKE 1 TABLET BY MOUTH EVERY DAY 90 tablet 1   diltiazem (CARDIZEM CD) 180 MG 24 hr capsule TAKE 1 CAPSULE (180 MG TOTAL) BY MOUTH AT BEDTIME. 90 capsule 2   estradiol (ESTRACE VAGINAL) 0.1 MG/GM vaginal cream Place 1 Applicatorful vaginally 2 (two) times a week. 127.5 g 3   famotidine (PEPCID) 40 MG tablet Take 1 tablet (40 mg total) by mouth daily. 90 tablet 2   ferrous sulfate 325 (65 FE) MG tablet Take 325 mg by mouth daily with breakfast.     fluticasone (FLONASE) 50 MCG/ACT nasal spray Place into both nostrils daily.     furosemide (LASIX) 20 MG tablet Take 1 tablet (20 mg total) by mouth daily. May take an additional tablet for edema 90 tablet 3   losartan (COZAAR) 100 MG tablet Take 1 tablet (100 mg total) by mouth daily. 90 tablet 3   methocarbamol (ROBAXIN) 500 MG tablet Take 1 tablet (500 mg total) by mouth daily as needed for muscle spasms. 60 tablet 2   metoprolol succinate (TOPROL-XL) 50 MG 24 hr  tablet Take 50 mg by mouth as needed (svt). Take with or immediately following a meal. -     mirabegron ER (MYRBETRIQ) 50 MG TB24 tablet Take 1 tablet (50 mg total) by mouth daily. 90 tablet 2   potassium chloride (KLOR-CON) 10 MEQ tablet TAKE 1 TABLET BY MOUTH EVERY DAY 90 tablet 1   rosuvastatin (CRESTOR) 20 MG tablet TAKE 1 TABLET BY MOUTH EVERYDAY AT BEDTIME 90 tablet 1   SUMAtriptan (IMITREX) 50 MG tablet Take 50 mg by mouth every 2 (two) hours as needed for migraine. May repeat in 2 hours if headache persists or recurs.     traZODone (DESYREL) 100 MG tablet TAKE 1 TABLET BY MOUTH EVERYDAY AT BEDTIME 90 tablet 1   valACYclovir (VALTREX) 1000 MG tablet Take 1 tablet (1,000 mg total) by mouth 2 (two) times daily. (Patient taking differently: Take 1,000 mg by mouth as needed. shingles) 60 tablet 2   vitamin B-12 (CYANOCOBALAMIN) 500 MCG tablet Take 500 mcg by mouth daily.     nitroGLYCERIN (NITROSTAT) 0.4 MG SL tablet Place 1 tablet (0.4 mg total) under the tongue every 5 (five) minutes as needed for chest pain. 25 tablet 3   No current facility-administered medications on file prior to visit.  Review of Systems  Constitutional:  Negative for fever.  Respiratory:  Positive for shortness of breath (with strenuous exertion). Negative for cough and wheezing.   Cardiovascular:  Positive for chest pain (chest pressure with SVT). Negative for palpitations and leg swelling.  Gastrointestinal:        Jerrye Bushy controlled  Neurological:  Positive for light-headedness (occ). Negative for headaches.       Objective:   Vitals:   05/24/22 1010  BP: 136/68  Pulse: 65  Temp: 98.1 F (36.7 C)  SpO2: 96%   BP Readings from Last 3 Encounters:  05/24/22 136/68  12/28/21 (!) 140/60  11/16/21 138/60   Wt Readings from Last 3 Encounters:  05/24/22 148 lb (67.1 kg)  12/28/21 153 lb (69.4 kg)  11/16/21 151 lb (68.5 kg)   Body mass index is 26.22 kg/m.    Physical Exam Constitutional:       General: She is not in acute distress.    Appearance: Normal appearance.  HENT:     Head: Normocephalic and atraumatic.  Eyes:     Conjunctiva/sclera: Conjunctivae normal.  Cardiovascular:     Rate and Rhythm: Normal rate and regular rhythm.     Heart sounds: Normal heart sounds. No murmur heard. Pulmonary:     Effort: Pulmonary effort is normal. No respiratory distress.     Breath sounds: Normal breath sounds. No wheezing.  Musculoskeletal:     Cervical back: Neck supple.     Right lower leg: No edema.     Left lower leg: No edema.  Lymphadenopathy:     Cervical: No cervical adenopathy.  Skin:    General: Skin is warm and dry.     Findings: No rash.  Neurological:     Mental Status: She is alert. Mental status is at baseline.  Psychiatric:        Mood and Affect: Mood normal.        Behavior: Behavior normal.        Lab Results  Component Value Date   WBC 6.1 11/16/2021   HGB 12.6 11/16/2021   HCT 38.5 11/16/2021   PLT 278.0 11/16/2021   GLUCOSE 98 01/25/2022   CHOL 179 11/16/2021   TRIG 151.0 (H) 11/16/2021   HDL 81.50 11/16/2021   LDLDIRECT 50.0 10/30/2018   LDLCALC 67 11/16/2021   ALT 15 11/16/2021   AST 19 11/16/2021   NA 143 01/25/2022   K 4.6 01/25/2022   CL 105 01/25/2022   CREATININE 1.02 (H) 01/25/2022   BUN 16 01/25/2022   CO2 23 01/25/2022   TSH 1.92 11/11/2020   INR 1.0 10/16/2018   HGBA1C 5.9 11/16/2021     Assessment & Plan:    See Problem List for Assessment and Plan of chronic medical problems.

## 2022-05-24 ENCOUNTER — Ambulatory Visit (INDEPENDENT_AMBULATORY_CARE_PROVIDER_SITE_OTHER): Payer: PPO | Admitting: Internal Medicine

## 2022-05-24 VITALS — BP 136/68 | HR 65 | Temp 98.1°F | Ht 63.0 in | Wt 148.0 lb

## 2022-05-24 DIAGNOSIS — R3915 Urgency of urination: Secondary | ICD-10-CM | POA: Diagnosis not present

## 2022-05-24 DIAGNOSIS — E785 Hyperlipidemia, unspecified: Secondary | ICD-10-CM | POA: Diagnosis not present

## 2022-05-24 DIAGNOSIS — I471 Supraventricular tachycardia, unspecified: Secondary | ICD-10-CM

## 2022-05-24 DIAGNOSIS — G4709 Other insomnia: Secondary | ICD-10-CM

## 2022-05-24 DIAGNOSIS — K588 Other irritable bowel syndrome: Secondary | ICD-10-CM | POA: Diagnosis not present

## 2022-05-24 DIAGNOSIS — R7303 Prediabetes: Secondary | ICD-10-CM | POA: Diagnosis not present

## 2022-05-24 DIAGNOSIS — I10 Essential (primary) hypertension: Secondary | ICD-10-CM

## 2022-05-24 DIAGNOSIS — I5032 Chronic diastolic (congestive) heart failure: Secondary | ICD-10-CM | POA: Diagnosis not present

## 2022-05-24 DIAGNOSIS — I700 Atherosclerosis of aorta: Secondary | ICD-10-CM

## 2022-05-24 DIAGNOSIS — I2510 Atherosclerotic heart disease of native coronary artery without angina pectoris: Secondary | ICD-10-CM

## 2022-05-24 DIAGNOSIS — G8929 Other chronic pain: Secondary | ICD-10-CM

## 2022-05-24 DIAGNOSIS — F418 Other specified anxiety disorders: Secondary | ICD-10-CM | POA: Diagnosis not present

## 2022-05-24 DIAGNOSIS — N3289 Other specified disorders of bladder: Secondary | ICD-10-CM | POA: Diagnosis not present

## 2022-05-24 DIAGNOSIS — M545 Low back pain, unspecified: Secondary | ICD-10-CM

## 2022-05-24 DIAGNOSIS — K219 Gastro-esophageal reflux disease without esophagitis: Secondary | ICD-10-CM | POA: Diagnosis not present

## 2022-05-24 LAB — CBC WITH DIFFERENTIAL/PLATELET
Basophils Absolute: 0 K/uL (ref 0.0–0.1)
Basophils Relative: 0.5 % (ref 0.0–3.0)
Eosinophils Absolute: 0.3 K/uL (ref 0.0–0.7)
Eosinophils Relative: 4.4 % (ref 0.0–5.0)
HCT: 35.6 % — ABNORMAL LOW (ref 36.0–46.0)
Hemoglobin: 12.1 g/dL (ref 12.0–15.0)
Lymphocytes Relative: 15.8 % (ref 12.0–46.0)
Lymphs Abs: 1 K/uL (ref 0.7–4.0)
MCHC: 33.9 g/dL (ref 30.0–36.0)
MCV: 97.2 fl (ref 78.0–100.0)
Monocytes Absolute: 0.6 K/uL (ref 0.1–1.0)
Monocytes Relative: 9.2 % (ref 3.0–12.0)
Neutro Abs: 4.3 K/uL (ref 1.4–7.7)
Neutrophils Relative %: 70.1 % (ref 43.0–77.0)
Platelets: 283 K/uL (ref 150.0–400.0)
RBC: 3.67 Mil/uL — ABNORMAL LOW (ref 3.87–5.11)
RDW: 12.8 % (ref 11.5–15.5)
WBC: 6.1 K/uL (ref 4.0–10.5)

## 2022-05-24 LAB — HEMOGLOBIN A1C: Hgb A1c MFr Bld: 6 % (ref 4.6–6.5)

## 2022-05-24 LAB — URINALYSIS, ROUTINE W REFLEX MICROSCOPIC
Bilirubin Urine: NEGATIVE
Hgb urine dipstick: NEGATIVE
Ketones, ur: NEGATIVE
Leukocytes,Ua: NEGATIVE
Nitrite: NEGATIVE
RBC / HPF: NONE SEEN (ref 0–?)
Specific Gravity, Urine: 1.015 (ref 1.000–1.030)
Total Protein, Urine: NEGATIVE
Urine Glucose: NEGATIVE
Urobilinogen, UA: 0.2 (ref 0.0–1.0)
pH: 5.5 (ref 5.0–8.0)

## 2022-05-24 LAB — COMPREHENSIVE METABOLIC PANEL
ALT: 14 U/L (ref 0–35)
AST: 18 U/L (ref 0–37)
Albumin: 4.5 g/dL (ref 3.5–5.2)
Alkaline Phosphatase: 55 U/L (ref 39–117)
BUN: 16 mg/dL (ref 6–23)
CO2: 26 mEq/L (ref 19–32)
Calcium: 9.8 mg/dL (ref 8.4–10.5)
Chloride: 105 mEq/L (ref 96–112)
Creatinine, Ser: 0.97 mg/dL (ref 0.40–1.20)
GFR: 56.12 mL/min — ABNORMAL LOW (ref >60.00)
Glucose, Bld: 110 mg/dL — ABNORMAL HIGH (ref 70–99)
Potassium: 3.9 mEq/L (ref 3.5–5.1)
Sodium: 142 mEq/L (ref 135–145)
Total Bilirubin: 0.3 mg/dL (ref 0.2–1.2)
Total Protein: 6.8 g/dL (ref 6.0–8.3)

## 2022-05-24 LAB — LIPID PANEL
Cholesterol: 169 mg/dL (ref 0–200)
HDL: 78.3 mg/dL (ref >39.00)
LDL Cholesterol: 53 mg/dL (ref 0–99)
NonHDL: 91.17
Total CHOL/HDL Ratio: 2
Triglycerides: 190 mg/dL — ABNORMAL HIGH (ref 0.0–149.0)
VLDL: 38 mg/dL (ref 0.0–40.0)

## 2022-05-24 LAB — TSH: TSH: 2.76 u[IU]/mL (ref 0.35–5.50)

## 2022-05-24 MED ORDER — DICLOFENAC POTASSIUM 50 MG PO TABS: 50.0000 mg | ORAL_TABLET | Freq: Every day | ORAL | 2 refills | Status: DC | PRN

## 2022-05-24 NOTE — Assessment & Plan Note (Signed)
Chronic Follows with cardiology No symptoms consistent with angina Continue current medications including aspirin 325 mg daily, Plavix 75 mg daily, Crestor 20 mg daily and blood pressure medications

## 2022-05-24 NOTE — Assessment & Plan Note (Signed)
Acute Having some urinary urgency and bladder spasms Currently taking Myrbetriq 50 mg daily which does help with the symptoms On occasion has had some mild incontinence trying to make it to the bathroom Check UA, urine culture to rule out infection

## 2022-05-24 NOTE — Assessment & Plan Note (Signed)
Chronic Controlled, stable Continue trazodone 100 mg nightly

## 2022-05-24 NOTE — Assessment & Plan Note (Signed)
Chronic Continue Crestor 20 mg daily Current regular exercise and healthy diet

## 2022-05-24 NOTE — Assessment & Plan Note (Signed)
Chronic GERD controlled Continue Nexium 40 mg daily

## 2022-05-24 NOTE — Assessment & Plan Note (Signed)
Chronic Controlled Continue Myrbetriq 50 mg daily

## 2022-05-24 NOTE — Assessment & Plan Note (Signed)
Chronic Check a1c Low sugar / carb diet Stressed regular exercise  

## 2022-05-24 NOTE — Assessment & Plan Note (Signed)
Chronic Euvolemic Following with cardiology Continue current medications-taking furosemide 20 mg daily

## 2022-05-24 NOTE — Assessment & Plan Note (Signed)
Chronic Controlled Takes Imodium as needed

## 2022-05-24 NOTE — Assessment & Plan Note (Signed)
Chronic, intermittent Has seen Dr. Nelva Bush in the past Taking diclofenac 50 mg daily as needed for flares of her back pain-she does not take this often-typically once every 2-3 weeks She would like me to start prescribing this Discussed she needs to keep this to a minimum use because of her mild decreased kidney function Diclofenac 50 mg daily as needed

## 2022-05-24 NOTE — Assessment & Plan Note (Signed)
Chronic Regular exercise and healthy diet encouraged Check lipid panel  Continue Crestor 20 mg daily 

## 2022-05-24 NOTE — Assessment & Plan Note (Signed)
Chronic Blood pressure well controlled CMP Continue Cardizem 180 mg daily, losartan 100 mg daily

## 2022-05-24 NOTE — Assessment & Plan Note (Addendum)
Chronic Follows with cardiology Taking metoprolol XL 50 mg daily as needed for SVT Check TSH

## 2022-05-24 NOTE — Assessment & Plan Note (Signed)
Chronic Intermittent Continue xanax 0.25-0.5 mg daily prn

## 2022-05-27 LAB — URINE CULTURE

## 2022-05-27 MED ORDER — CEPHALEXIN 500 MG PO CAPS: 500.0000 mg | ORAL_CAPSULE | Freq: Two times a day (BID) | ORAL | 0 refills | Status: DC

## 2022-05-27 NOTE — Addendum Note (Signed)
Addended by: Binnie Rail on: 05/27/2022 02:10 PM   Modules accepted: Orders

## 2022-06-14 ENCOUNTER — Ambulatory Visit
Admission: RE | Admit: 2022-06-14 | Discharge: 2022-06-14 | Disposition: A | Discharge status: Home / Self Care | Payer: PPO | Source: Ambulatory Visit | Attending: Internal Medicine | Admitting: Internal Medicine

## 2022-06-14 DIAGNOSIS — Z1231 Encounter for screening mammogram for malignant neoplasm of breast: Secondary | ICD-10-CM | POA: Diagnosis not present

## 2022-06-22 DIAGNOSIS — I471 Supraventricular tachycardia, unspecified: Secondary | ICD-10-CM | POA: Diagnosis not present

## 2022-06-22 DIAGNOSIS — Z1331 Encounter for screening for depression: Secondary | ICD-10-CM | POA: Diagnosis not present

## 2022-07-07 ENCOUNTER — Ambulatory Visit: Payer: PPO | Admitting: Cardiology

## 2022-07-12 DIAGNOSIS — H524 Presbyopia: Secondary | ICD-10-CM | POA: Diagnosis not present

## 2022-07-12 DIAGNOSIS — H52223 Regular astigmatism, bilateral: Secondary | ICD-10-CM | POA: Diagnosis not present

## 2022-07-12 DIAGNOSIS — Z961 Presence of intraocular lens: Secondary | ICD-10-CM | POA: Diagnosis not present

## 2022-07-12 DIAGNOSIS — H353132 Nonexudative age-related macular degeneration, bilateral, intermediate dry stage: Secondary | ICD-10-CM | POA: Diagnosis not present

## 2022-07-12 DIAGNOSIS — H5203 Hypermetropia, bilateral: Secondary | ICD-10-CM | POA: Diagnosis not present

## 2022-07-17 ENCOUNTER — Other Ambulatory Visit: Payer: Self-pay | Admitting: Internal Medicine

## 2022-07-17 ENCOUNTER — Other Ambulatory Visit: Payer: Self-pay | Admitting: Cardiology

## 2022-07-17 DIAGNOSIS — E785 Hyperlipidemia, unspecified: Secondary | ICD-10-CM

## 2022-07-17 DIAGNOSIS — I5032 Chronic diastolic (congestive) heart failure: Secondary | ICD-10-CM

## 2022-07-18 ENCOUNTER — Other Ambulatory Visit: Payer: Self-pay | Admitting: Internal Medicine

## 2022-07-18 ENCOUNTER — Other Ambulatory Visit: Payer: Self-pay

## 2022-08-25 ENCOUNTER — Ambulatory Visit: Payer: PPO | Attending: Cardiology | Admitting: Cardiology

## 2022-08-25 ENCOUNTER — Encounter: Payer: Self-pay | Admitting: Cardiology

## 2022-08-25 ENCOUNTER — Other Ambulatory Visit: Payer: Self-pay | Admitting: Internal Medicine

## 2022-08-25 VITALS — BP 116/74 | HR 73 | Ht 63.0 in | Wt 151.4 lb

## 2022-08-25 DIAGNOSIS — I5032 Chronic diastolic (congestive) heart failure: Secondary | ICD-10-CM

## 2022-08-25 DIAGNOSIS — I6523 Occlusion and stenosis of bilateral carotid arteries: Secondary | ICD-10-CM

## 2022-08-25 DIAGNOSIS — I471 Supraventricular tachycardia, unspecified: Secondary | ICD-10-CM | POA: Diagnosis not present

## 2022-08-25 DIAGNOSIS — E785 Hyperlipidemia, unspecified: Secondary | ICD-10-CM

## 2022-08-25 DIAGNOSIS — I10 Essential (primary) hypertension: Secondary | ICD-10-CM

## 2022-08-25 DIAGNOSIS — I2510 Atherosclerotic heart disease of native coronary artery without angina pectoris: Secondary | ICD-10-CM | POA: Diagnosis not present

## 2022-08-25 DIAGNOSIS — I771 Stricture of artery: Secondary | ICD-10-CM

## 2022-08-25 NOTE — Patient Instructions (Signed)
Medication Instructions:  Your physician recommends that you continue on your current medications as directed. Please refer to the Current Medication list given to you today.  *If you need a refill on your cardiac medications before your next appointment, please call your pharmacy*   Lab Work: None.  If you have labs (blood work) drawn today and your tests are completely normal, you will receive your results only by: MyChart Message (if you have MyChart) OR A paper copy in the mail If you have any lab test that is abnormal or we need to change your treatment, we will call you to review the results.   Testing/Procedures: None.   Follow-Up:   Your next appointment:   1 year(s)  Provider:   Traci Turner, MD     

## 2022-08-25 NOTE — Progress Notes (Signed)
Cardiology Office Note:    Date:  08/25/2022   ID:  Susan Gillespie, DOB 1946-01-02, MRN [id number]  PCP:  Pincus Sanes, MD  Cardiologist:  Armanda Magic, MD    Referring MD: Pincus Sanes, MD   Chief Complaint  Patient presents with   Coronary Artery Disease   Hypertension   Hyperlipidemia   Congestive Heart Failure    History of Present Illness:    Susan Gillespie is a 77 y.o. female with a hx of paroxysmal supraventricular tachycardia.  She also has a history of left subclavian steal.  She underwent stenting by Dr. Romelle Starcher and then repeat angioplasty on 01/05/2016 down to 80-85% patency. Dopplers 05/2017 with > 50% stenosis with patent stent in the left subclavian artery.  Repeat dopplers in 11/2017 showed 1-39% bilateral carotid stenosis with no mention of subclavian artery flow.     Cardiac CT for SOB 10/26/16 showed calcium score 92nd percentile for age/sex, significant calcific 3V disease with possibly hemodynamically significant stenosis in mid RCA and mLAD; subsequent FFR overread showed functionally significant D1 disease. She underwent LHC 11/10/16 showing moderate to moderately severe 3v calcific disease with anatomy and obstruction similar to that reported on cor CTA - Proximal 50-60% LAD, 70% proximal first diagonal, 50-60% prox-mid Cx ostial 60% and mid 50% RCa, normal LV function with elevated EDP c/w chronic diastolic CHF.  She is now on medical management.    She was seen by Dr. Izora Ribas last fall for palpitations and a Ziopatch was ordered showing multiple episodes of SVT but no PAF. Cardizem CD was increased 180mg  daily.   She is here today for followup and is doing well.  She denies any chest pain or pressure, SOB, DOE, PND, orthopnea, LE edema, dizziness, palpitations or syncope. She is compliant with her meds and is tolerating meds with no SE.    Past Medical History:  Diagnosis Date   Anxiety    Artery stenosis (HCC)    left internal carotid, left subclavian    Arthritis    Bleeding tendency (HCC) 05/16/2012   Hx post op bleeding (epidural hematoma s/p ACDF '10; LLQ hematoma due to superficial vessel fascial bleed post colon resection; Normal platelet count; saw Dr. Cyndie Chime '14   CAD in native artery    a. mod to mod-severe CAD by cath 10/2016, med rx.   Carotid stenosis, bilateral 08/31/2016   1-39% right and 50-69% lefft ICA stenosis dopplers 04/2019   Chronic diastolic CHF (congestive heart failure) (HCC)    Colon polyp    Diverticulitis    Family history of cardiovascular disease    Fracture of right fibula    GERD (gastroesophageal reflux disease)    Headache(784.0)    mirgraines - history of   Hearing aid worn    B/L   HOH (hard of hearing)    wears hearing aids   Hyperlipidemia    Hypertension    IBS (irritable bowel syndrome)    followed by Dr. Kinnie Scales   Liver cyst    Melanoma (HCC)    removed from back 04/2013   Shortness of breath dyspnea    with exertion   Stroke (HCC) 06/2012   mild   SVT (supraventricular tachycardia)    Urinary urgency    UTI (lower urinary tract infection)    x 2 since June 2016   Wears glasses     Past Surgical History:  Procedure Laterality Date   ABDOMINAL HYSTERECTOMY     ANGIOPLASTY  stent in subclavian LEFT 2016, angioplasty of stent 2017   APPENDECTOMY     AUGMENTATION MAMMAPLASTY Bilateral    replaced silicone with saline 2004   BREAST EXCISIONAL BIOPSY Left    benign 1977   BREAST SURGERY     BIL breast augmentation   CHOLECYSTECTOMY     COLON SURGERY     Sigmoid Colectomy, returned for post op bleeding   COLONOSCOPY W/ POLYPECTOMY     COLONOSCOPY WITH PROPOFOL N/A 05/05/2016   Procedure: COLONOSCOPY WITH PROPOFOL;  Surgeon: Ovidio Kin, MD;  Location: [facility];  Service: General;  Laterality: N/A;   ESOPHAGOGASTRODUODENOSCOPY (EGD) WITH PROPOFOL N/A 05/05/2016   Procedure: ESOPHAGOGASTRODUODENOSCOPY (EGD) WITH PROPOFOL;  Surgeon: Ovidio Kin, MD;  Location: [facility];  Service: General;  Laterality: N/A;   EYE SURGERY     eyelid drooping fixed   IR ANGIO INTRA EXTRACRAN SEL COM CAROTID INNOMINATE BILAT MOD SED  10/16/2018   IR ANGIO VERTEBRAL SEL SUBCLAVIAN INNOMINATE UNI L MOD SED  10/16/2018   IR ANGIO VERTEBRAL SEL VERTEBRAL UNI R MOD SED  10/16/2018   IR GENERIC HISTORICAL  12/24/2015   IR ANGIO INTRA EXTRACRAN SEL COM CAROTID INNOMINATE BILAT MOD SED 12/24/2015 Julieanne Cotton, MD [facility]   IR GENERIC HISTORICAL  12/24/2015   IR ANGIOGRAM EXTREMITY LEFT 12/24/2015 Julieanne Cotton, MD [facility]   IR GENERIC HISTORICAL  12/24/2015   IR ANGIO VERTEBRAL SEL VERTEBRAL UNI R MOD SED 12/24/2015 Julieanne Cotton, MD [facility]   IR GENERIC HISTORICAL  01/05/2016   IR PTA NON CORO-LOWER EXTREM 01/05/2016 Julieanne Cotton, MD [facility]   IR GENERIC HISTORICAL  03/07/2016   IR RADIOLOGIST EVAL & MGMT 03/07/2016 [facility]   JOINT REPLACEMENT Left    thumb   LEFT HEART CATH AND CORONARY ANGIOGRAPHY N/A 11/10/2016   Procedure: Left Heart Cath and Coronary Angiography;  Surgeon: Lyn Records, MD;  Location: [facility] CV LAB;  Service: Cardiovascular;  Laterality: N/A;   MASTOID DEBRIDEMENT     MASTOIDECTOMY REVISION     RADIOLOGY WITH ANESTHESIA N/A 11/09/2014   Procedure: STENT PLACEMENT;  Surgeon: Julieanne Cotton, MD;  Location: [facility];  Service: Radiology;  Laterality: N/A;   RADIOLOGY WITH ANESTHESIA N/A 05/31/2015   Procedure: RADIOLOGY WITH ANESTHESIA;  Surgeon: Julieanne Cotton, MD;  Location: [facility];  Service: Radiology;  Laterality: N/A;   RADIOLOGY WITH ANESTHESIA N/A 01/05/2016   Procedure: RADIOLOGY WITH ANESTHESIA ANGIOPLASTY WITH STENTNG;  Surgeon: Julieanne Cotton, MD;  Location: [facility];  Service: Radiology;  Laterality: N/A;   RADIOLOGY WITH ANESTHESIA N/A 10/16/2018   Procedure: STENTING;  Surgeon: Julieanne Cotton, MD;  Location: [facility];  Service: Radiology;  Laterality: N/A;   skin cancer excised  04/2013    Melonoma   SPINE SURGERY     cervical fusion, returned to OR for post op  bleeding   TONSILLECTOMY     TONSILLECTOMY     TUBAL LIGATION     VAGINA SURGERY     anterior posterior repair    Current Medications: Current Meds  Medication Sig   ALPRAZolam (XANAX) 0.5 MG tablet TAKE 1/2 TO 1 TABLET BY MOUTH AT BEDTIME AS NEEDED FOR ANXIETY OR SLEEP   aspirin 325 MG tablet Take 325 mg by mouth daily.   cetirizine (ZYRTEC) 10 MG tablet Take 10 mg by mouth every morning.   clopidogrel (PLAVIX) 75 MG tablet TAKE 1 TABLET BY MOUTH EVERY DAY   diclofenac (CATAFLAM) 50 MG tablet Take  1 tablet (50 mg total) by mouth daily as needed (back pain).   diltiazem (CARDIZEM CD) 180 MG 24 hr capsule TAKE 1 CAPSULE (180 MG TOTAL) BY MOUTH AT BEDTIME.   famotidine (PEPCID) 40 MG tablet Take 1 tablet (40 mg total) by mouth daily.   ferrous sulfate 325 (65 FE) MG tablet Take 325 mg by mouth daily with breakfast.   fluticasone (FLONASE) 50 MCG/ACT nasal spray Place into both nostrils daily.   furosemide (LASIX) 20 MG tablet TAKE 1 TABLET (20 MG TOTAL) BY MOUTH DAILY. MAY TAKE AN ADDITIONAL TABLET FOR EDEMA   losartan (COZAAR) 100 MG tablet Take 1 tablet (100 mg total) by mouth daily.   methocarbamol (ROBAXIN) 500 MG tablet TAKE 1 TABLET (500 MG TOTAL) BY MOUTH DAILY AS NEEDED FOR MUSCLE SPASMS.   metoprolol succinate (TOPROL-XL) 50 MG 24 hr tablet Take 50 mg by mouth as needed (svt). Take with or immediately following a meal. -   mirabegron ER (MYRBETRIQ) 50 MG TB24 tablet Take 1 tablet (50 mg total) by mouth daily.   potassium chloride (KLOR-CON) 10 MEQ tablet TAKE 1 TABLET BY MOUTH EVERY DAY   rosuvastatin (CRESTOR) 20 MG tablet TAKE 1 TABLET BY MOUTH EVERYDAY AT BEDTIME   SUMAtriptan (IMITREX) 50 MG tablet Take 50 mg by mouth every 2 (two) hours as needed for migraine. May repeat in 2 hours if headache persists or recurs.   traZODone (DESYREL) 100 MG tablet TAKE 1 TABLET BY MOUTH EVERYDAY AT BEDTIME    valACYclovir (VALTREX) 1000 MG tablet Take 1 tablet (1,000 mg total) by mouth 2 (two) times daily. (Patient taking differently: Take 1,000 mg by mouth as needed. shingles)   vitamin B-12 (CYANOCOBALAMIN) 500 MCG tablet Take 500 mcg by mouth daily.   [DISCONTINUED] estradiol (ESTRACE VAGINAL) 0.1 MG/GM vaginal cream Place 1 Applicatorful vaginally 2 (two) times a week.     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[employer], retired  Tobacco Use   Smoking status: Former    Packs/day: 2.00    Years: 22.00    Additional pack years: 0.00    Total pack years: 44.00    Types: Cigarettes    Quit date: 04/23/1985    Years since quitting: 37.3   Smokeless tobacco: Never  Vaping Use   Vaping Use: Never used  Substance and Sexual Activity   Alcohol use: Yes    Alcohol/week: 4.0 standard drinks of alcohol    Types: 4 Glasses of wine per week    Comment: 1 drink a  day   Drug use: No   Sexual activity: Yes    Birth control/protection: Surgical  Other Topics Concern   Not on file  Social History Narrative   ** Merged History Encounter **       Domestic partner   Regular exercise-no   Right handed   Caffeine use-- 2 cups coffee daily, rare soda   Social Determinants of Health   Financial Resource Strain: Low Risk  (11/21/2021)   Overall Financial Resource Strain (CARDIA)    Difficulty of Paying Living Expenses: Not hard at  all  Food Insecurity: No Food Insecurity (11/21/2021)   Hunger Vital Sign    Worried About Running Out of Food in the Last Year: Never true    Ran Out of Food in the Last Year: Never true  Transportation  Needs: No Transportation Needs (11/21/2021)   PRAPARE - Administrator, Civil Service (Medical): No    Lack of Transportation (Non-Medical): No  Physical Activity: Inactive (11/21/2021)   Exercise Vital Sign    Days of Exercise per Week: 0 days    Minutes of Exercise per Session: 0 min  Stress: No Stress Concern Present (11/21/2021)   [employer] of Occupational Health - Occupational Stress Questionnaire    Feeling of Stress : Not at all  Social Connections: Moderately Integrated (11/21/2021)   Social Connection and Isolation Panel [NHANES]    Frequency of Communication with Friends and Family: More than three times a week    Frequency of Social Gatherings with Friends and Family: Three times a week    Attends Religious Services: 1 to 4 times per year    Active Member of Clubs or Organizations: Yes    Attends Engineer, structural: More than 4 times per year    Marital Status: Divorced     Family History: The patient's family history includes Cancer in an other family member; Heart attack in her brother and sister; Heart disease in her brother, father, and sister; Hyperlipidemia in an other family member; Hypertension in an other family member; Stroke in her son and another family member. There is no history of Breast cancer.  ROS:   Please see the history of present illness.    ROS  All other systems reviewed and negative.   EKGs/Labs/Other Studies Reviewed:    The following studies were reviewed today: Outside labs from [facility]  EKG:  EKG is not ordered today   Recent Labs: 05/24/2022: ALT 14; BUN 16; Creatinine, Ser 0.97; Hemoglobin 12.1; Platelets 283.0; Potassium 3.9; Sodium 142; TSH 2.76   Recent Lipid Panel    Component Value Date/Time   CHOL 169 05/24/2022 1050   CHOL 165 05/29/2019 1402   TRIG 190.0 (H) 05/24/2022 1050   HDL 78.30 05/24/2022 1050   HDL 84 05/29/2019 1402   CHOLHDL 2 05/24/2022 1050   VLDL 38.0 05/24/2022 1050   LDLCALC 53  05/24/2022 1050   LDLCALC 65 11/11/2019 0856   LDLDIRECT 50.0 10/30/2018 0852    Physical Exam:    VS:  BP 116/74   Pulse 73   Ht 5\' 3"  (1.6 m)   Wt 151 lb 6.4 oz (68.7 kg)   LMP  (LMP Unknown)   SpO2 97%   BMI 26.82 kg/m     Wt Readings from Last 3 Encounters:  08/25/22 151 lb 6.4 oz (68.7 kg)  05/24/22 148 lb (67.1 kg)  12/28/21 153 lb (69.4 kg)     GEN: Well nourished, well developed in no acute distress HEENT: Normal NECK: No JVD; No carotid bruits LYMPHATICS: No lymphadenopathy CARDIAC:RRR, no murmurs, rubs, gallops RESPIRATORY:  Clear to auscultation without rales, wheezing or rhonchi  ABDOMEN: Soft, non-tender, non-distended MUSCULOSKELETAL:  No edema; No deformity  SKIN: Warm and dry NEUROLOGIC:  Alert and oriented x 3 PSYCHIATRIC:  Normal affect  ASSESSMENT:    1. CAD in native artery   2. Chronic diastolic CHF (congestive heart failure), NYHA class 1 (HCC)   3. Carotid stenosis, bilateral   4. Paroxysmal supraventricular tachycardia   5. Essential hypertension,  benign   6. 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HCC)   7. Hyperlipidemia LDL goal <70    PLAN:    In order of problems listed above:  1.  ASCAD  - LHC 11/10/16 showed moderate to moderately severe 3v calcific disease with proximal 50-60% LAD, 70% proximal first diagonal, 50-60% prox-mid Cx ostial 60% and mid 50% RCA.   -She denies any anginal symptoms since I saw her last -She will continue prescription drug management with Plavix 75 mg daily, aspirin 325 mg daily and Crestor 20mg  daily  2.  Chronic diastolic CHF  -Not appear volume overloaded on exam today -Continue prescription drug management with Lasix 20 mg daily with as needed refills -I have personally reviewed and interpreted outside labs performed by patient's PCP which showed serum creatinine 0.97 and potassium 3.9 on 05/24/2022  3.  Carotid stenosis bilaterally  -1-39% RICA and 60-79% LICA stenosis by dopplers 04-2019 -continue ASA , Plavix  and statin -followed by Dr. Corliss Skains   4.  PSVT  -She had some episodes of SVT by heart monitor back in the fall but palpitations been fairly well-controlled recently -Continue prescription manage with Cardizem CD 180 mg daily as well as Toprol-XL 50 mg daily as needed for SVT breakthrough   5.  HTN -Heart rate is controlled on exam today -Continue prescription drug management with  losartan 100 mg daily, Cardizem CD 180 mg daily with as needed refills  6.  Subclavian artery stenosis  - she has a history of left subclavian steal and is s/p  stenting by Dr. Romelle Starcher with repeat angioplasty on 01/05/2016 down to 80-85% patency.  -Dopplers 08/2019 with 60-79% left and 1-39% right carotid stenosis and retrograde flow in left vertebral artery -continue ASA, Plavix and statin -followed by Dr. Romelle Starcher   7.  Hyperlipidemia   LDL goal is < 70.   -I have personally reviewed and interpreted outside labs performed by patient's PCP which showed LDL 53 and HDL 78 on 05/24/2022 -continue prescription drug management with Crestor 20mg  daily with PRN refills     Medication Adjustments/Labs and Tests Ordered: Current medicines are reviewed at length with the patient today.  Concerns regarding medicines are outlined above.  No orders of the defined types were placed in this encounter.  No orders of the defined types were placed in this encounter.   Signed, Armanda Magic, MD  08/25/2022 1:16 PM    [facility]

## 2022-10-13 ENCOUNTER — Other Ambulatory Visit: Payer: Self-pay | Admitting: Internal Medicine

## 2022-10-13 DIAGNOSIS — G4709 Other insomnia: Secondary | ICD-10-CM

## 2022-10-14 ENCOUNTER — Other Ambulatory Visit: Payer: Self-pay | Admitting: Internal Medicine

## 2022-10-16 ENCOUNTER — Telehealth (HOSPITAL_BASED_OUTPATIENT_CLINIC_OR_DEPARTMENT_OTHER): Payer: Self-pay | Admitting: *Deleted

## 2022-10-16 ENCOUNTER — Other Ambulatory Visit (HOSPITAL_COMMUNITY): Payer: Self-pay | Admitting: Interventional Radiology

## 2022-10-16 DIAGNOSIS — I771 Stricture of artery: Secondary | ICD-10-CM

## 2022-10-16 NOTE — Telephone Encounter (Signed)
Left message for patient to call and schedule the carotid doppler ordered by Dr. Corliss Skains

## 2022-10-18 ENCOUNTER — Encounter: Payer: Self-pay | Admitting: Internal Medicine

## 2022-10-18 ENCOUNTER — Other Ambulatory Visit: Payer: Self-pay

## 2022-10-18 MED ORDER — MIRABEGRON ER 50 MG PO TB24: 50.0000 mg | ORAL_TABLET | Freq: Every day | ORAL | 2 refills | Status: DC

## 2022-10-20 ENCOUNTER — Other Ambulatory Visit: Payer: Self-pay

## 2022-10-20 ENCOUNTER — Encounter: Payer: Self-pay | Admitting: Internal Medicine

## 2022-10-20 MED ORDER — MYRBETRIQ 50 MG PO TB24: 50.0000 mg | ORAL_TABLET | Freq: Every day | ORAL | 5 refills | Status: DC

## 2022-11-15 ENCOUNTER — Ambulatory Visit (INDEPENDENT_AMBULATORY_CARE_PROVIDER_SITE_OTHER): Payer: PPO

## 2022-11-15 DIAGNOSIS — I771 Stricture of artery: Secondary | ICD-10-CM | POA: Diagnosis not present

## 2022-11-20 ENCOUNTER — Other Ambulatory Visit (HOSPITAL_COMMUNITY): Payer: Self-pay | Admitting: Interventional Radiology

## 2022-11-20 DIAGNOSIS — I771 Stricture of artery: Secondary | ICD-10-CM

## 2022-11-21 NOTE — Progress Notes (Unsigned)
Subjective:    Patient ID: Susan Gillespie, female    DOB: 1945/06/14, 77 y.o.   MRN: [id number]     HPI Kalyana is here for follow up of her chronic medical problems.  Taking myrbetriq - not helping enough - still has incontinence and still has bladder spasms.  Bladder spasms cause flushing.  She is taking the estradiol.   IBS - saw Dr Kinnie Scales.  Has bowel urgency frequently and often has fecal incontinence.   Was on lotronex and that helped her bowels and bladder.  Stopped it due to expense.    Right side - under axilla pain - only when she lays on that side.  Mammogram up-to-date.  Sees cardiology regularly.  Medications and allergies reviewed with patient and updated if appropriate.  Current Outpatient Medications on File Prior to Visit  Medication Sig Dispense Refill   ALPRAZolam (XANAX) 0.5 MG tablet TAKE 1/2 TO 1 TABLET BY MOUTH AT BEDTIME AS NEEDED FOR ANXIETY OR SLEEP 30 tablet 2   aspirin 325 MG tablet Take 325 mg by mouth daily.     cetirizine (ZYRTEC) 10 MG tablet Take 10 mg by mouth every morning.     clopidogrel (PLAVIX) 75 MG tablet TAKE 1 TABLET BY MOUTH EVERY DAY 90 tablet 1   diclofenac (CATAFLAM) 50 MG tablet Take 1 tablet (50 mg total) by mouth daily as needed (back pain). 30 tablet 2   diltiazem (CARDIZEM CD) 180 MG 24 hr capsule TAKE 1 CAPSULE (180 MG TOTAL) BY MOUTH AT BEDTIME. 90 capsule 2   estradiol (ESTRACE) 0.1 MG/GM vaginal cream PLACE APPLICATORFUL VAGINALLY TWICE WEEKLY 127.5 g 4   famotidine (PEPCID) 40 MG tablet Take 1 tablet (40 mg total) by mouth daily. 90 tablet 2   ferrous sulfate 325 (65 FE) MG tablet Take 325 mg by mouth daily with breakfast.     fluticasone (FLONASE) 50 MCG/ACT nasal spray Place into both nostrils daily.     furosemide (LASIX) 20 MG tablet TAKE 1 TABLET (20 MG TOTAL) BY MOUTH DAILY. MAY TAKE AN ADDITIONAL TABLET FOR EDEMA 90 tablet 1   losartan (COZAAR) 100 MG tablet Take 1 tablet (100 mg total) by mouth daily. 90 tablet 3    methocarbamol (ROBAXIN) 500 MG tablet TAKE 1 TABLET (500 MG TOTAL) BY MOUTH DAILY AS NEEDED FOR MUSCLE SPASMS. 60 tablet 2   metoprolol succinate (TOPROL-XL) 50 MG 24 hr tablet Take 50 mg by mouth as needed (svt). Take with or immediately following a meal. -     MYRBETRIQ 50 MG TB24 tablet Take 1 tablet (50 mg total) by mouth daily. 30 tablet 5   potassium chloride (KLOR-CON) 10 MEQ tablet TAKE 1 TABLET BY MOUTH EVERY DAY 90 tablet 1   rosuvastatin (CRESTOR) 20 MG tablet TAKE 1 TABLET BY MOUTH EVERYDAY AT BEDTIME 90 tablet 1   SUMAtriptan (IMITREX) 50 MG tablet Take 50 mg by mouth every 2 (two) hours as needed for migraine. May repeat in 2 hours if headache persists or recurs.     traZODone (DESYREL) 100 MG tablet Take 1 tablet (100 mg total) by mouth at bedtime. Per MD Return in about 6 months (around 11/22/2022) for Physical Exam. 90 tablet 0   vitamin B-12 (CYANOCOBALAMIN) 500 MCG tablet Take 500 mcg by mouth daily.     No current facility-administered medications on file prior to visit.     Review of Systems  Constitutional:  Negative for fever.  Respiratory:  Positive  for shortness of breath (at times). Negative for cough and wheezing.   Cardiovascular:  Negative for chest pain, palpitations and leg swelling.  Gastrointestinal:  Positive for diarrhea (at times with IBS and urgency, incontinence).  Genitourinary:  Positive for frequency.       Bladder spasms, incontinence  Neurological:  Negative for light-headedness and headaches.       Objective:   Vitals:   11/22/22 0959  BP: 124/60  Pulse: 64  Temp: 98.1 F (36.7 C)  SpO2: 97%   BP Readings from Last 3 Encounters:  11/22/22 124/60  08/25/22 116/74  05/24/22 136/68   Wt Readings from Last 3 Encounters:  11/22/22 150 lb 6.4 oz (68.2 kg)  08/25/22 151 lb 6.4 oz (68.7 kg)  05/24/22 148 lb (67.1 kg)   Body mass index is 26.64 kg/m.    Physical Exam Constitutional:      General: She is not in acute distress.     Appearance: Normal appearance.  HENT:     Head: Normocephalic and atraumatic.  Eyes:     Conjunctiva/sclera: Conjunctivae normal.  Neck:     Vascular: Carotid bruit present.  Cardiovascular:     Rate and Rhythm: Normal rate and regular rhythm.     Heart sounds: Murmur (2/6 sys) heard.  Pulmonary:     Effort: Pulmonary effort is normal. No respiratory distress.     Breath sounds: Normal breath sounds. No wheezing.  Musculoskeletal:     Cervical back: Neck supple.     Right lower leg: No edema.     Left lower leg: No edema.  Lymphadenopathy:     Cervical: No cervical adenopathy.  Skin:    General: Skin is warm and dry.     Findings: No rash.  Neurological:     Mental Status: She is alert. Mental status is at baseline.  Psychiatric:        Mood and Affect: Mood normal.        Behavior: Behavior normal.        Lab Results  Component Value Date   WBC 6.1 05/24/2022   HGB 12.1 05/24/2022   HCT 35.6 (L) 05/24/2022   PLT 283.0 05/24/2022   GLUCOSE 110 (H) 05/24/2022   CHOL 169 05/24/2022   TRIG 190.0 (H) 05/24/2022   HDL 78.30 05/24/2022   LDLDIRECT 50.0 10/30/2018   LDLCALC 53 05/24/2022   ALT 14 05/24/2022   AST 18 05/24/2022   NA 142 05/24/2022   K 3.9 05/24/2022   CL 105 05/24/2022   CREATININE 0.97 05/24/2022   BUN 16 05/24/2022   CO2 26 05/24/2022   TSH 2.76 05/24/2022   INR 1.0 10/16/2018   HGBA1C 6.0 05/24/2022     Assessment & Plan:    See Problem List for Assessment and Plan of chronic medical problems.

## 2022-11-21 NOTE — Patient Instructions (Addendum)
      Blood work was ordered.   The lab is on the first floor.    Medications changes include :   None    A referral was ordered [facility] and someone will call you to schedule an appointment.     Return in about 6 months (around 05/25/2023) for Physical Exam.

## 2022-11-22 ENCOUNTER — Encounter: Payer: Self-pay | Admitting: Internal Medicine

## 2022-11-22 ENCOUNTER — Ambulatory Visit: Payer: PPO | Admitting: Internal Medicine

## 2022-11-22 VITALS — BP 124/60 | HR 64 | Temp 98.1°F | Ht 63.0 in | Wt 150.4 lb

## 2022-11-22 DIAGNOSIS — F418 Other specified anxiety disorders: Secondary | ICD-10-CM

## 2022-11-22 DIAGNOSIS — N3289 Other specified disorders of bladder: Secondary | ICD-10-CM

## 2022-11-22 DIAGNOSIS — E785 Hyperlipidemia, unspecified: Secondary | ICD-10-CM

## 2022-11-22 DIAGNOSIS — G8929 Other chronic pain: Secondary | ICD-10-CM

## 2022-11-22 DIAGNOSIS — I2510 Atherosclerotic heart disease of native coronary artery without angina pectoris: Secondary | ICD-10-CM

## 2022-11-22 DIAGNOSIS — G4709 Other insomnia: Secondary | ICD-10-CM

## 2022-11-22 DIAGNOSIS — R350 Frequency of micturition: Secondary | ICD-10-CM

## 2022-11-22 DIAGNOSIS — I5032 Chronic diastolic (congestive) heart failure: Secondary | ICD-10-CM

## 2022-11-22 DIAGNOSIS — R7303 Prediabetes: Secondary | ICD-10-CM | POA: Diagnosis not present

## 2022-11-22 DIAGNOSIS — I10 Essential (primary) hypertension: Secondary | ICD-10-CM | POA: Diagnosis not present

## 2022-11-22 DIAGNOSIS — M159 Polyosteoarthritis, unspecified: Secondary | ICD-10-CM

## 2022-11-22 DIAGNOSIS — K588 Other irritable bowel syndrome: Secondary | ICD-10-CM | POA: Diagnosis not present

## 2022-11-22 DIAGNOSIS — M545 Low back pain, unspecified: Secondary | ICD-10-CM | POA: Diagnosis not present

## 2022-11-22 LAB — LIPID PANEL
Cholesterol: 161 mg/dL (ref 0–200)
HDL: 78.7 mg/dL (ref >39.00)
LDL Cholesterol: 44 mg/dL (ref 0–99)
NonHDL: 82.7
Total CHOL/HDL Ratio: 2
Triglycerides: 195 mg/dL — ABNORMAL HIGH (ref 0.0–149.0)
VLDL: 39 mg/dL (ref 0.0–40.0)

## 2022-11-22 LAB — CBC WITH DIFFERENTIAL/PLATELET
Basophils Absolute: 0 10*3/uL (ref 0.0–0.1)
Basophils Relative: 0.6 % (ref 0.0–3.0)
Eosinophils Absolute: 0.1 10*3/uL (ref 0.0–0.7)
Eosinophils Relative: 2.2 % (ref 0.0–5.0)
HCT: 36.7 % (ref 36.0–46.0)
Hemoglobin: 12.1 g/dL (ref 12.0–15.0)
Lymphocytes Relative: 16.8 % (ref 12.0–46.0)
Lymphs Abs: 0.9 10*3/uL (ref 0.7–4.0)
MCHC: 33 g/dL (ref 30.0–36.0)
MCV: 98.6 fl (ref 78.0–100.0)
Monocytes Absolute: 0.4 10*3/uL (ref 0.1–1.0)
Monocytes Relative: 8.1 % (ref 3.0–12.0)
Neutro Abs: 3.8 10*3/uL (ref 1.4–7.7)
Neutrophils Relative %: 72.3 % (ref 43.0–77.0)
Platelets: 261 10*3/uL (ref 150.0–400.0)
RBC: 3.72 Mil/uL — ABNORMAL LOW (ref 3.87–5.11)
RDW: 13.4 % (ref 11.5–15.5)
WBC: 5.2 10*3/uL (ref 4.0–10.5)

## 2022-11-22 LAB — COMPREHENSIVE METABOLIC PANEL
ALT: 13 U/L (ref 0–35)
AST: 16 U/L (ref 0–37)
Albumin: 4.4 g/dL (ref 3.5–5.2)
Alkaline Phosphatase: 50 U/L (ref 39–117)
BUN: 17 mg/dL (ref 6–23)
CO2: 30 mEq/L (ref 19–32)
Calcium: 10.6 mg/dL — ABNORMAL HIGH (ref 8.4–10.5)
Chloride: 102 mEq/L (ref 96–112)
Creatinine, Ser: 1.2 mg/dL (ref 0.40–1.20)
GFR: 43.32 mL/min — ABNORMAL LOW (ref >60.00)
Glucose, Bld: 114 mg/dL — ABNORMAL HIGH (ref 70–99)
Potassium: 4 mEq/L (ref 3.5–5.1)
Sodium: 139 mEq/L (ref 135–145)
Total Bilirubin: 0.4 mg/dL (ref 0.2–1.2)
Total Protein: 6.7 g/dL (ref 6.0–8.3)

## 2022-11-22 LAB — URINALYSIS, ROUTINE W REFLEX MICROSCOPIC
Bilirubin Urine: NEGATIVE
Hgb urine dipstick: NEGATIVE
Ketones, ur: NEGATIVE
Leukocytes,Ua: NEGATIVE
Nitrite: POSITIVE — AB
RBC / HPF: NONE SEEN (ref 0–?)
Specific Gravity, Urine: 1.02 (ref 1.000–1.030)
Urine Glucose: NEGATIVE
Urobilinogen, UA: 1 (ref 0.0–1.0)
pH: 6 (ref 5.0–8.0)

## 2022-11-22 LAB — HEMOGLOBIN A1C: Hgb A1c MFr Bld: 6.3 % (ref 4.6–6.5)

## 2022-11-22 MED ORDER — DICLOFENAC POTASSIUM 50 MG PO TABS: 50.0000 mg | ORAL_TABLET | Freq: Every day | ORAL | 0 refills | Status: DC | PRN

## 2022-11-22 NOTE — Assessment & Plan Note (Addendum)
Chronic Taking diclofenac daily prn only-takes approximately 2-3 times a month only Renewed today

## 2022-11-22 NOTE — Assessment & Plan Note (Signed)
Acute on chronic Has increased urinary frequency Wants to make sure she does not have an infection Check UA, urine culture

## 2022-11-22 NOTE — Assessment & Plan Note (Signed)
Chronic Intermittent Continue xanax 0.25-0.5 mg daily prn

## 2022-11-22 NOTE — Assessment & Plan Note (Addendum)
Chronic Not ideally controlled Has fecal urgency and fecal incontinence Has had partial colectomy Not currently seeing GI Takes Imodium as needed At 1 point was on Lotronex which helped a lot with her bowel and even her bladder Recommended seeing GI to reevaluate if Lotronex or other medication would benefit her with her history

## 2022-11-22 NOTE — Assessment & Plan Note (Signed)
Chronic Check a1c Low sugar / carb diet Stressed regular exercise  

## 2022-11-22 NOTE — Assessment & Plan Note (Signed)
Chronic Regular exercise and healthy diet encouraged Check lipid panel, CMP Continue Crestor 20 mg daily 

## 2022-11-22 NOTE — Assessment & Plan Note (Signed)
Chronic Follows with cardiology No symptoms consistent with angina Continue current medications including aspirin 325 mg daily, Plavix 75 mg daily, Crestor 20 mg daily and blood pressure medications

## 2022-11-22 NOTE — Assessment & Plan Note (Addendum)
Chronic Euvolemic Following with cardiology Continue current medications-taking furosemide 40 mg daily - increased with the warmer weather - had increased swelling - as it get cooler will decrease back to 20 mg

## 2022-11-22 NOTE — Assessment & Plan Note (Signed)
Chronic Controlled, stable Continue trazodone 100 mg nightly 

## 2022-11-22 NOTE — Assessment & Plan Note (Signed)
Chronic, intermittent Has seen Dr. Ethelene Hal in the past Taking diclofenac 50 mg daily as needed for flares of her back pain-she does not take this often-typically once every 2-3 weeks Reminded she needs to keep this to a minimum use because of her mild decreased kidney function Diclofenac 50 mg daily as needed

## 2022-11-22 NOTE — Assessment & Plan Note (Addendum)
Chronic Not ideally controlled Still has spasms and incontinence Has IBS with frequent urgency and fecal incontinence Continue Myrbetriq 50 mg daily Refer to urogyn Discussed IBS - may need to see GI - lotronex helped in the past - may need medication for IBS - recommended seeing GI given partial colectomy

## 2022-11-22 NOTE — Assessment & Plan Note (Signed)
Chronic Blood pressure well controlled CMP Continue Cardizem 180 mg daily, losartan 100 mg daily

## 2022-11-25 MED ORDER — SULFAMETHOXAZOLE-TRIMETHOPRIM 800-160 MG PO TABS: 1.0000 | ORAL_TABLET | Freq: Two times a day (BID) | ORAL | 0 refills | Status: AC

## 2022-11-25 NOTE — Addendum Note (Signed)
Addended by: Pincus Sanes on: 11/25/2022 04:40 PM   Modules accepted: Orders

## 2022-11-28 ENCOUNTER — Other Ambulatory Visit (HOSPITAL_COMMUNITY): Payer: Self-pay

## 2022-11-28 ENCOUNTER — Encounter: Payer: Self-pay | Admitting: Internal Medicine

## 2022-11-28 ENCOUNTER — Ambulatory Visit (HOSPITAL_COMMUNITY)
Admission: RE | Admit: 2022-11-28 | Discharge: 2022-11-28 | Disposition: A | Discharge status: Home / Self Care | Payer: PPO | Source: Ambulatory Visit | Attending: Interventional Radiology | Admitting: Interventional Radiology

## 2022-11-28 DIAGNOSIS — I6523 Occlusion and stenosis of bilateral carotid arteries: Secondary | ICD-10-CM | POA: Diagnosis not present

## 2022-11-28 DIAGNOSIS — I771 Stricture of artery: Secondary | ICD-10-CM | POA: Diagnosis present

## 2022-11-28 DIAGNOSIS — Z7982 Long term (current) use of aspirin: Secondary | ICD-10-CM | POA: Insufficient documentation

## 2022-11-28 MED ORDER — DICLOFENAC SODIUM 50 MG PO TBEC: 50.0000 mg | DELAYED_RELEASE_TABLET | Freq: Every day | ORAL | 1 refills | Status: DC | PRN

## 2022-11-29 ENCOUNTER — Telehealth: Payer: Self-pay | Admitting: Internal Medicine

## 2022-11-29 NOTE — Telephone Encounter (Signed)
Spoke with Bishop today from [employer] and questions answered.

## 2022-11-29 NOTE — Telephone Encounter (Signed)
[facility] called wanting to ask some clinicals questions about medication Diclofenac.  Best call Back is [phone]

## 2022-11-30 HISTORY — PX: IR RADIOLOGIST EVAL & MGMT: IMG5224

## 2022-12-05 ENCOUNTER — Telehealth (HOSPITAL_COMMUNITY): Payer: Self-pay

## 2022-12-05 NOTE — Telephone Encounter (Signed)
Per Dr. Corliss Skains, p2y12 results were fine, she is to continue on her current dose of Plavix. AB

## 2022-12-07 ENCOUNTER — Encounter: Payer: Self-pay | Admitting: Internal Medicine

## 2022-12-29 ENCOUNTER — Encounter: Payer: Self-pay | Admitting: Obstetrics and Gynecology

## 2022-12-29 ENCOUNTER — Ambulatory Visit: Payer: PPO | Admitting: Obstetrics and Gynecology

## 2022-12-29 VITALS — BP 134/70 | HR 81 | Ht 62.3 in | Wt 148.0 lb

## 2022-12-29 DIAGNOSIS — R159 Full incontinence of feces: Secondary | ICD-10-CM | POA: Diagnosis not present

## 2022-12-29 DIAGNOSIS — N3941 Urge incontinence: Secondary | ICD-10-CM | POA: Diagnosis not present

## 2022-12-29 DIAGNOSIS — R152 Fecal urgency: Secondary | ICD-10-CM | POA: Diagnosis not present

## 2022-12-29 DIAGNOSIS — N3281 Overactive bladder: Secondary | ICD-10-CM | POA: Diagnosis not present

## 2022-12-29 DIAGNOSIS — R350 Frequency of micturition: Secondary | ICD-10-CM | POA: Diagnosis not present

## 2022-12-29 DIAGNOSIS — N390 Urinary tract infection, site not specified: Secondary | ICD-10-CM | POA: Diagnosis not present

## 2022-12-29 DIAGNOSIS — R351 Nocturia: Secondary | ICD-10-CM | POA: Diagnosis not present

## 2022-12-29 LAB — POCT URINALYSIS DIPSTICK
Bilirubin, UA: NEGATIVE
Blood, UA: NEGATIVE
Glucose, UA: NEGATIVE
Ketones, UA: NEGATIVE
Leukocytes, UA: NEGATIVE
Nitrite, UA: NEGATIVE
Protein, UA: POSITIVE — AB
Spec Grav, UA: 1.02 (ref 1.010–1.025)
Urobilinogen, UA: 0.2 U/dL
pH, UA: 6.5 (ref 5.0–8.0)

## 2022-12-29 MED ORDER — VIBEGRON 75 MG PO TABS: 75.0000 mg | ORAL_TABLET | Freq: Every day | ORAL | 5 refills | Status: DC

## 2022-12-29 NOTE — Progress Notes (Signed)
[facility] New Patient Evaluation and Consultation  Referring Provider: Pincus Sanes, MD PCP: Pincus Sanes, MD Date of Service: 12/29/2022  SUBJECTIVE Chief Complaint: New Patient (Initial Visit) Susan Gillespie is a 77 y.o. female is here for bladder spasms and retention.)  History of Present Illness: Susan Gillespie is a 77 y.o. White or Caucasian female seen in consultation at the request of Dr. Lawerance Bach for evaluation of bladder spasms, urinary retention, urgency.    Review of records significant for: History of Birch procedure, hysterectomy, and A&P repair  Urinary Symptoms: Leaks urine with going from sitting to standing, with a full bladder, with movement to the bathroom, and with urgency Leaks 1-2 time(s) per days.  Pad use: 2 liners/ mini-pads per day.   She is bothered by her UI symptoms.  Day time voids 5-7.  Nocturia: 3-4 times per night to void. Voiding dysfunction: she does not empty her bladder well.  does not use a catheter to empty bladder.  When urinating, she feels difficulty starting urine stream, dribbling after finishing, the need to urinate multiple times in a row, and to push on her belly or vagina to empty bladder Drinks:4-6oz Coffee AM, 36oz of Water per day  UTIs: 2 UTI's in the last year.   Denies history of blood in urine, kidney or bladder stones, pyelonephritis, bladder cancer, and kidney cancer  Pelvic Organ Prolapse Symptoms:                  She Denies a feeling of a bulge the vaginal area.   Bowel Symptom: Bowel movements: Varies  Stool consistency: hard Straining: yes.  Splinting: yes.  Incomplete evacuation: yes.  She Admits to accidental bowel leakage / fecal incontinence  Occurs: 1-2 time(s) per week  Consistency with leakage: soft  Bowel regimen: diet Last colonoscopy: Date 2018, Results Diverticulosis   Sexual Function Sexually active: no.  Sexual orientation: Straight Pain with sex: No  Pelvic Pain Denies pelvic  pain    Past Medical History:  Past Medical History:  Diagnosis Date   Anxiety    Artery stenosis (HCC)    left internal carotid, left subclavian   Arthritis    Bleeding tendency (HCC) 05/16/2012   Hx post op bleeding (epidural hematoma s/p ACDF '10; LLQ hematoma due to superficial vessel fascial bleed post colon resection; Normal platelet count; saw Dr. Cyndie Chime '14   CAD in native artery    a. mod to mod-severe CAD by cath 10/2016, med rx.   Carotid stenosis, bilateral 08/31/2016   1-39% right and 50-69% lefft ICA stenosis dopplers 04/2019   Chronic diastolic CHF (congestive heart failure) (HCC)    Colon polyp    Diverticulitis    Family history of cardiovascular disease    Fracture of right fibula    GERD (gastroesophageal reflux disease)    Headache(784.0)    mirgraines - history of   Hearing aid worn    B/L   HOH (hard of hearing)    wears hearing aids   Hyperlipidemia    Hypertension    IBS (irritable bowel syndrome)    followed by Dr. Kinnie Scales   Liver cyst    Melanoma (HCC)    removed from back 04/2013   Shortness of breath dyspnea    with exertion   Stroke (HCC) 06/2012   mild   SVT (supraventricular tachycardia)    Urinary urgency    UTI (lower urinary tract infection)    x 2 since  June 2016   Wears glasses      Past Surgical History:   Past Surgical History:  Procedure Laterality Date   ABDOMINAL HYSTERECTOMY     ANGIOPLASTY     stent in subclavian LEFT 2016, angioplasty of stent 2017   APPENDECTOMY     AUGMENTATION MAMMAPLASTY Bilateral    replaced silicone with saline 2004   BREAST EXCISIONAL BIOPSY Left    benign 1977   BREAST SURGERY     BIL breast augmentation   CHOLECYSTECTOMY     COLON SURGERY     Sigmoid Colectomy, returned for post op bleeding   COLONOSCOPY W/ POLYPECTOMY     COLONOSCOPY WITH PROPOFOL N/A 05/05/2016   Procedure: COLONOSCOPY WITH PROPOFOL;  Surgeon: Ovidio Kin, MD;  Location: [facility];  Service: General;   Laterality: N/A;   ESOPHAGOGASTRODUODENOSCOPY (EGD) WITH PROPOFOL N/A 05/05/2016   Procedure: ESOPHAGOGASTRODUODENOSCOPY (EGD) WITH PROPOFOL;  Surgeon: Ovidio Kin, MD;  Location: [facility];  Service: General;  Laterality: N/A;   EYE SURGERY     eyelid drooping fixed   IR ANGIO INTRA EXTRACRAN SEL COM CAROTID INNOMINATE BILAT MOD SED  10/16/2018   IR ANGIO VERTEBRAL SEL SUBCLAVIAN INNOMINATE UNI L MOD SED  10/16/2018   IR ANGIO VERTEBRAL SEL VERTEBRAL UNI R MOD SED  10/16/2018   IR GENERIC HISTORICAL  12/24/2015   IR ANGIO INTRA EXTRACRAN SEL COM CAROTID INNOMINATE BILAT MOD SED 12/24/2015 Julieanne Cotton, MD [facility]   IR GENERIC HISTORICAL  12/24/2015   IR ANGIOGRAM EXTREMITY LEFT 12/24/2015 Julieanne Cotton, MD [facility]   IR GENERIC HISTORICAL  12/24/2015   IR ANGIO VERTEBRAL SEL VERTEBRAL UNI R MOD SED 12/24/2015 Julieanne Cotton, MD [facility]   IR GENERIC HISTORICAL  01/05/2016   IR PTA NON CORO-LOWER EXTREM 01/05/2016 Julieanne Cotton, MD [facility]   IR GENERIC HISTORICAL  03/07/2016   IR RADIOLOGIST EVAL & MGMT 03/07/2016 [facility]   IR RADIOLOGIST EVAL & MGMT  11/30/2022   JOINT REPLACEMENT Left    thumb   LEFT HEART CATH AND CORONARY ANGIOGRAPHY N/A 11/10/2016   Procedure: Left Heart Cath and Coronary Angiography;  Surgeon: Lyn Records, MD;  Location: [facility];  Service: Cardiovascular;  Laterality: N/A;   MASTOID DEBRIDEMENT     MASTOIDECTOMY REVISION     RADIOLOGY WITH ANESTHESIA N/A 11/09/2014   Procedure: STENT PLACEMENT;  Surgeon: Julieanne Cotton, MD;  Location: [facility];  Service: Radiology;  Laterality: N/A;   RADIOLOGY WITH ANESTHESIA N/A 05/31/2015   Procedure: RADIOLOGY WITH ANESTHESIA;  Surgeon: Julieanne Cotton, MD;  Location: [facility];  Service: Radiology;  Laterality: N/A;   RADIOLOGY WITH ANESTHESIA N/A 01/05/2016   Procedure: RADIOLOGY WITH ANESTHESIA ANGIOPLASTY WITH STENTNG;  Surgeon: Julieanne Cotton, MD;  Location: [facility];   Service: Radiology;  Laterality: N/A;   RADIOLOGY WITH ANESTHESIA N/A 10/16/2018   Procedure: STENTING;  Surgeon: Julieanne Cotton, MD;  Location: [facility];  Service: Radiology;  Laterality: N/A;   skin cancer excised  04/2013   Melonoma   SPINE SURGERY     cervical fusion, returned to OR for post op  bleeding   TONSILLECTOMY     TONSILLECTOMY     TUBAL LIGATION     VAGINA SURGERY     anterior posterior repair     Past OB/GYN History: G2 P2 Vaginal deliveries: 2,  Forceps/ Vacuum deliveries: 0, Cesarean section: 0 Menopausal: Yes, at age 1 Contraception: Hysterectomy 1987 Last pap smear was 2015.  Any  history of abnormal pap smears: no.   Medications: She has a current medication list which includes the following prescription(s): alprazolam, aspirin, cetirizine, clopidogrel, diclofenac, diltiazem, estradiol, famotidine, ferrous sulfate, fluticasone, furosemide, losartan, methocarbamol, metoprolol succinate, potassium chloride, rosuvastatin, sumatriptan, trazodone, vibegron, and vitamin b-12.   Allergies: Patient is allergic to phenergan [promethazine hcl], daypro [oxaprozin], lisinopril, oxycodone, simvastatin, and tramadol.   Social History:  Social History   Tobacco Use   Smoking status: Former    Current packs/day: 0.00    Average packs/day: 2.0 packs/day for 22.0 years (44.0 ttl pk-yrs)    Types: Cigarettes    Start date: 04/24/1963    Quit date: 04/23/1985    Years since quitting: 37.7   Smokeless tobacco: Never  Vaping Use   Vaping status: Never Used  Substance Use Topics   Alcohol use: Yes    Alcohol/week: 4.0 standard drinks of alcohol    Types: 4 Glasses of wine per week    Comment: 1 drink a  day   Drug use: No    Relationship status: divorced She lives alone.   She is not employed. Regular exercise: No History of abuse: No  Family History:   Family History  Problem Relation Age of Onset   Heart disease Father    Heart disease Sister    Heart attack  Sister    Heart disease Brother    Heart attack Brother    Stroke Son    Hyperlipidemia Other    Hypertension Other    Cancer Other        lung, esophagus, stomach   Stroke Other    Breast cancer Neg Hx      Review of Systems: Review of Systems  Constitutional:  Negative for chills and fever.  Respiratory:  Negative for cough and shortness of breath.   Cardiovascular:  Negative for chest pain and palpitations.  Gastrointestinal:  Negative for abdominal pain, blood in stool, constipation and diarrhea.  Skin:  Negative for rash.  Neurological:  Negative for weakness.  Endo/Heme/Allergies:  Bruises/bleeds easily.       +Hot Flashes  Psychiatric/Behavioral:  Negative for depression and suicidal ideas. The patient is nervous/anxious.      OBJECTIVE Physical Exam: Vitals:   12/29/22 0929 12/29/22 1107  BP: (!) 145/69 134/70  Pulse: 81 81  Weight: 148 lb (67.1 kg)   Height: 5' 2.3" (1.582 m)     Physical Exam Constitutional:      Appearance: Normal appearance. She is normal weight.  Pulmonary:     Effort: Pulmonary effort is normal.  Abdominal:     General: Abdomen is flat.     Palpations: Abdomen is soft.  Skin:    General: Skin is warm and dry.  Neurological:     Mental Status: She is alert and oriented to person, place, and time.  Psychiatric:        Mood and Affect: Mood normal.        Behavior: Behavior normal.        Thought Content: Thought content normal.      GU / Detailed Urogynecologic Evaluation:  Pelvic Exam: Normal external female genitalia; Bartholin's and Skene's glands normal in appearance; urethral meatus normal in appearance, no urethral masses or discharge.   CST: negative   s/p hysterectomy: Speculum exam reveals normal vaginal mucosa with  atrophy and normal vaginal cuff.  Adnexa normal adnexa.    With apex supported, anterior compartment defect was reduced  Pelvic floor strength I/V  Pelvic  floor musculature: Right levator  non-tender, Right obturator non-tender, Left levator non-tender, Left obturator non-tender  POP-Q:   POP-Q  -2                                            Aa   -2                                           Ba  -6.5                                              C   1.5                                            Gh  2                                            Pb  8                                            tvl   -2.5                                            Ap  -2.5                                            Bp                                                 D      Rectal Exam:  Normal external exam.  Post-Void Residual (PVR) by Bladder Scan: In order to evaluate bladder emptying, we discussed obtaining a postvoid residual and she agreed to this procedure.  Procedure: The ultrasound unit was placed on the patient's abdomen in the suprapubic region after the patient had voided. A PVR of 23 ml was obtained by bladder scan.  Laboratory Results: POC Urine: Positive for protein, negative for other components   ASSESSMENT AND PLAN Ms. Nau is a 77 y.o. with:  1. Urinary frequency   2. OAB (overactive bladder)   3. Urge incontinence   4. Nocturia   5. Incontinence of feces with fecal urgency    OAB  We discussed the symptoms of overactive bladder (OAB), which include urinary urgency, urinary frequency, nocturia, with or without urge incontinence.  While we do not know the exact etiology of OAB, several treatment options exist. We  discussed management including behavioral therapy (decreasing bladder irritants, urge suppression strategies, timed voids, bladder retraining), physical therapy, medication; for refractory cases posterior tibial nerve stimulation, sacral neuromodulation, and intravesical botulinum toxin injection. For anticholinergic medications, we discussed the potential side effects of anticholinergics including dry eyes, dry mouth, constipation, cognitive  impairment and urinary retention. For Beta-3 agonist medication, we discussed the potential side effect of elevated blood pressure which is more likely to occur in individuals with uncontrolled hypertension.She has not been getting good effects from her Myrbetriq. Will switch to Gemtesa 75mg  daily. She is not a good candidate for other medications that are in the anticholinergic family. We discussed pelvic floor PT and she is not interested at this time. She is interested potentially in SNM as she does also have FI.   Also encouraged patient to intentionally drink fluids instead of sipping and to discontinue drinking about 2 hours prior to bedtime.   FI  Patient may be a good candidate for SNM. We discussed using some fiber to bulk her stool as well. She has been taking imodium as well when needed. Not interested in pelvic floor PT.  Recurrent UTI:  She has been having asymptomatic Klebsiella + cultures the last 3 times she has been to her PCP. Unsure if colonized due to asymptomatic state during the cultures and consistent >100,000 cultures found in each one. POC urine today not concerning for infection. Encouraged her to use estrogen cream at the urethra as she is already using it vaginally.    Patient to follow up in 6 weeks or sooner if needed. Will have her follow up with surgeon to discuss if she is interested in SNM.   Selmer Dominion, NP

## 2022-12-29 NOTE — Patient Instructions (Addendum)
Today we talked about ways to manage bladder urgency such as altering your diet to avoid irritative beverages and foods (bladder diet) as well as attempting to decrease stress and other exacerbating factors.  You can also chew a plain Tums 1-3 times per day to make your urine less acidic, especially if you have eating/drinking acidic things.   There is a website with helpful information for people with bladder irritation, called the IC Network at https://www.ic-network.com. This website has more information about a healthy bladder diet and patient forums for support.  The Most Bothersome Foods* The Least Bothersome Foods*  Coffee - Regular & Decaf Tea - caffeinated Carbonated beverages - cola, non-colas, diet & caffeine-free Alcohols - Beer, Red Wine, White Wine, [address] - Grapefruit, [place], Orange, [employer] - Cranberry, Grapefruit, Orange, Pineapple Vegetables - Tomato & Tomato Products Flavor Enhancers - Hot peppers, Spicy foods, Chili, Horseradish, Vinegar, Monosodium glutamate (MSG) Artificial Sweeteners - NutraSweet, Sweet 'N Low, Equal (sweetener), Saccharin Ethnic foods - [place], [place], [place] food [employer] - low-fat & whole Fruits - Bananas, Blueberries, Honeydew melon, Pears, Raisins, Watermelon Vegetables - Broccoli, [address] Sprouts, [place], Carrots, Cauliflower, [place], Cucumber, Mushrooms, Peas, Radishes, Squash, Zucchini, White potatoes, Sweet potatoes & yams Poultry - Chicken, Eggs, [place], [employer] - Beef, Diplomatic Services operational officer, Lamb Seafood - Shrimp, [place] fish, Salmon Grains - Oat, Rice Snacks - Pretzels, Popcorn  *Lenward Chancellor et al. Diet and its role in interstitial cystitis/bladder pain syndrome (IC/BPS) and comorbid conditions. [employer]. BJU Int. 2012 Jan 11.    Drink your fluids intentionally 4-8 oz at a time to decrease some of the frequency and try to stop drinking about 2 hours before bed.   Consider the options of the pacemaker for the bladder.    I have given you samples of the Gemtesa to try while we see about approval from your insurance.

## 2023-01-02 ENCOUNTER — Encounter: Payer: Self-pay | Admitting: Obstetrics and Gynecology

## 2023-01-04 DIAGNOSIS — H353132 Nonexudative age-related macular degeneration, bilateral, intermediate dry stage: Secondary | ICD-10-CM | POA: Diagnosis not present

## 2023-01-11 ENCOUNTER — Other Ambulatory Visit: Payer: Self-pay | Admitting: Internal Medicine

## 2023-01-11 DIAGNOSIS — E785 Hyperlipidemia, unspecified: Secondary | ICD-10-CM

## 2023-01-11 DIAGNOSIS — K219 Gastro-esophageal reflux disease without esophagitis: Secondary | ICD-10-CM

## 2023-01-11 DIAGNOSIS — G4709 Other insomnia: Secondary | ICD-10-CM

## 2023-01-11 DIAGNOSIS — I5032 Chronic diastolic (congestive) heart failure: Secondary | ICD-10-CM

## 2023-01-31 ENCOUNTER — Ambulatory Visit: Payer: PPO

## 2023-01-31 VITALS — BP 120/59 | HR 61 | Temp 97.6°F | Ht 62.3 in | Wt 149.8 lb

## 2023-01-31 DIAGNOSIS — Z0000 Encounter for general adult medical examination without abnormal findings: Secondary | ICD-10-CM

## 2023-01-31 NOTE — Patient Instructions (Addendum)
Ms. Foulk , Thank you for taking time to come for your Medicare Wellness Visit. I appreciate your ongoing commitment to your health goals. Please review the following plan we discussed and let me know if I can assist you in the future.   Referrals/Orders/Follow-Ups/Clinician Recommendations: No  This is a list of the screening recommended for you and due dates:  Health Maintenance  Topic Date Due   COVID-19 Vaccine (6 - 2023-24 season) 12/17/2022   DEXA scan (bone density measurement)  01/13/2024   Medicare Annual Wellness Visit  01/31/2024   DTaP/Tdap/Td vaccine (3 - Td or Tdap) 06/15/2031   Pneumonia Vaccine  Completed   Flu Shot  Completed   Hepatitis C Screening  Completed   Zoster (Shingles) Vaccine  Completed   HPV Vaccine  Aged Out   Colon Cancer Screening  Discontinued    Advanced directives: (Copy Requested) Please bring a copy of your health care power of attorney and living will to the office to be added to your chart at your convenience.  Next Medicare Annual Wellness Visit scheduled for next year: No

## 2023-01-31 NOTE — Progress Notes (Addendum)
Subjective:   Susan Gillespie is a 77 y.o. female who presents for Medicare Annual (Subsequent) preventive examination.  Visit Complete: In person  Patient Medicare AWV questionnaire was completed by the patient on 01/30/2023; I have confirmed that all information answered by patient is correct and no changes since this date.  Cardiac Risk Factors include: advanced age (>43men, >47 women);dyslipidemia;hypertension;family history of premature cardiovascular disease;sedentary lifestyle     Objective:    Today's Vitals   01/31/23 1431 01/31/23 1432  BP: (!) 120/59   Pulse: 61   Temp: 97.6 F (36.4 C)   TempSrc: Temporal   SpO2: 99%   Weight: 149 lb 12.8 oz (67.9 kg)   Height: 5' 2.3" (1.582 m)   PainSc: 3  3   PainLoc: Back    Body mass index is 27.14 kg/m.     01/31/2023    3:10 PM 03/18/2021    3:04 PM 09/17/2020    1:45 PM 07/29/2019   11:34 AM 12/24/2018    5:34 PM 11/10/2016    8:47 AM 05/09/2016    4:58 PM  Advanced Directives  Does Patient Have a Medical Advance Directive? Yes Yes Yes Yes Yes Yes Yes  Type of Estate agent of [place];Living will Out of facility DNR (pink MOST or yellow form);Living will;Healthcare Power of Attorney Living will;Healthcare Power of Attorney Living will Healthcare Power of [place];Living will Healthcare Power of [place];Living will Living will;Healthcare Power of Attorney  Does patient want to make changes to medical advance directive?   No - Patient declined   No - Patient declined   Copy of Healthcare Power of Attorney in Chart? No - copy requested  No - copy requested   No - copy requested     Current Medications (verified) Outpatient Encounter Medications as of 01/31/2023  Medication Sig   ALPRAZolam (XANAX) 0.5 MG tablet TAKE 1/2 TO 1 TABLET BY MOUTH AT BEDTIME AS NEEDED FOR ANXIETY OR SLEEP   aspirin 325 MG tablet Take 325 mg by mouth daily.   cetirizine (ZYRTEC) 10 MG tablet Take 10 mg by mouth every morning.    clopidogrel (PLAVIX) 75 MG tablet TAKE 1 TABLET BY MOUTH EVERY DAY   diclofenac (VOLTAREN) 50 MG EC tablet Take 1 tablet (50 mg total) by mouth daily as needed.   diltiazem (CARDIZEM CD) 180 MG 24 hr capsule TAKE 1 CAPSULE (180 MG TOTAL) BY MOUTH AT BEDTIME.   estradiol (ESTRACE) 0.1 MG/GM vaginal cream PLACE APPLICATORFUL VAGINALLY TWICE WEEKLY   famotidine (PEPCID) 40 MG tablet TAKE 1 TABLET BY MOUTH EVERY DAY   ferrous sulfate 325 (65 FE) MG tablet Take 325 mg by mouth daily with breakfast.   fluticasone (FLONASE) 50 MCG/ACT nasal spray Place into both nostrils daily.   furosemide (LASIX) 20 MG tablet TAKE 1 TABLET (20 MG TOTAL) BY MOUTH DAILY. MAY TAKE AN ADDITIONAL TABLET FOR EDEMA   losartan (COZAAR) 100 MG tablet Take 1 tablet (100 mg total) by mouth daily.   methocarbamol (ROBAXIN) 500 MG tablet TAKE 1 TABLET (500 MG TOTAL) BY MOUTH DAILY AS NEEDED FOR MUSCLE SPASMS.   metoprolol succinate (TOPROL-XL) 50 MG 24 hr tablet Take 50 mg by mouth as needed (svt). Take with or immediately following a meal. -   potassium chloride (KLOR-CON) 10 MEQ tablet TAKE 1 TABLET BY MOUTH EVERY DAY   rosuvastatin (CRESTOR) 20 MG tablet TAKE 1 TABLET BY MOUTH EVERYDAY AT BEDTIME   SUMAtriptan (IMITREX) 50 MG tablet Take 50  mg by mouth every 2 (two) hours as needed for migraine. May repeat in 2 hours if headache persists or recurs.   traZODone (DESYREL) 100 MG tablet TAKE 1 TABLET AT BEDTIME. PER MD RETURN IN ABOUT 6 MONTHS (AROUND 11/22/2022) FOR PHYSICAL EXAM.   Vibegron 75 MG TABS Take 1 tablet (75 mg total) by mouth daily.   vitamin B-12 (CYANOCOBALAMIN) 500 MCG tablet Take 500 mcg by mouth daily.   No facility-administered encounter medications on file as of 01/31/2023.    Allergies (verified) Phenergan [promethazine hcl], Daypro [oxaprozin], Lisinopril, Oxycodone, Simvastatin, and Tramadol   History: Past Medical History:  Diagnosis Date   Anxiety    Artery stenosis (HCC)    left internal  carotid, left subclavian   Arthritis    Bleeding tendency (HCC) 05/16/2012   Hx post op bleeding (epidural hematoma s/p ACDF '10; LLQ hematoma due to superficial vessel fascial bleed post colon resection; Normal platelet count; saw Dr. Cyndie Chime '14   CAD in native artery    a. mod to mod-severe CAD by cath 10/2016, med rx.   Carotid stenosis, bilateral 08/31/2016   1-39% right and 50-69% lefft ICA stenosis dopplers 04/2019   Chronic diastolic CHF (congestive heart failure) (HCC)    Colon polyp    Diverticulitis    Family history of cardiovascular disease    Fracture of right fibula    GERD (gastroesophageal reflux disease)    Headache(784.0)    mirgraines - history of   Hearing aid worn    B/L   HOH (hard of hearing)    wears hearing aids   Hyperlipidemia    Hypertension    IBS (irritable bowel syndrome)    followed by Dr. Kinnie Scales   Liver cyst    Melanoma (HCC)    removed from back 04/2013   Shortness of breath dyspnea    with exertion   Stroke (HCC) 06/2012   mild   SVT (supraventricular tachycardia) (HCC)    Urinary urgency    UTI (lower urinary tract infection)    x 2 since June 2016   Wears glasses    Past Surgical History:  Procedure Laterality Date   ABDOMINAL HYSTERECTOMY     ANGIOPLASTY     stent in subclavian LEFT 2016, angioplasty of stent 2017   APPENDECTOMY     AUGMENTATION MAMMAPLASTY Bilateral    replaced silicone with saline 2004   BREAST EXCISIONAL BIOPSY Left    benign 1977   BREAST SURGERY     BIL breast augmentation   CHOLECYSTECTOMY     COLON SURGERY     Sigmoid Colectomy, returned for post op bleeding   COLONOSCOPY W/ POLYPECTOMY     COLONOSCOPY WITH PROPOFOL N/A 05/05/2016   Procedure: COLONOSCOPY WITH PROPOFOL;  Surgeon: Ovidio Kin, MD;  Location: [facility];  Service: General;  Laterality: N/A;   ESOPHAGOGASTRODUODENOSCOPY (EGD) WITH PROPOFOL N/A 05/05/2016   Procedure: ESOPHAGOGASTRODUODENOSCOPY (EGD) WITH PROPOFOL;  Surgeon: Ovidio Kin, MD;  Location: [facility];  Service: General;  Laterality: N/A;   EYE SURGERY     eyelid drooping fixed   IR ANGIO INTRA EXTRACRAN SEL COM CAROTID INNOMINATE BILAT MOD SED  10/16/2018   IR ANGIO VERTEBRAL SEL SUBCLAVIAN INNOMINATE UNI L MOD SED  10/16/2018   IR ANGIO VERTEBRAL SEL VERTEBRAL UNI R MOD SED  10/16/2018   IR GENERIC HISTORICAL  12/24/2015   IR ANGIO INTRA EXTRACRAN SEL COM CAROTID INNOMINATE BILAT MOD SED 12/24/2015 Julieanne Cotton, MD [facility]  IR GENERIC HISTORICAL  12/24/2015   IR ANGIOGRAM EXTREMITY LEFT 12/24/2015 Julieanne Cotton, MD [facility]   IR GENERIC HISTORICAL  12/24/2015   IR ANGIO VERTEBRAL SEL VERTEBRAL UNI R MOD SED 12/24/2015 Julieanne Cotton, MD [facility]   IR GENERIC HISTORICAL  01/05/2016   IR PTA NON CORO-LOWER EXTREM 01/05/2016 Julieanne Cotton, MD [facility]   IR GENERIC HISTORICAL  03/07/2016   IR RADIOLOGIST EVAL & MGMT 03/07/2016 [facility]   IR RADIOLOGIST EVAL & MGMT  11/30/2022   JOINT REPLACEMENT Left    thumb   LEFT HEART CATH AND CORONARY ANGIOGRAPHY N/A 11/10/2016   Procedure: Left Heart Cath and Coronary Angiography;  Surgeon: Lyn Records, MD;  [facility]: [facility];  Service: Cardiovascular;  Laterality: N/A;   MASTOID DEBRIDEMENT     MASTOIDECTOMY REVISION     RADIOLOGY WITH ANESTHESIA N/A 11/09/2014   Procedure: STENT PLACEMENT;  Surgeon: Julieanne Cotton, MD;  Location: [facility];  Service: Radiology;  Laterality: N/A;   RADIOLOGY WITH ANESTHESIA N/A 05/31/2015   Procedure: RADIOLOGY WITH ANESTHESIA;  Surgeon: Julieanne Cotton, MD;  Location: [facility];  Service: Radiology;  Laterality: N/A;   RADIOLOGY WITH ANESTHESIA N/A 01/05/2016   Procedure: RADIOLOGY WITH ANESTHESIA ANGIOPLASTY WITH STENTNG;  Surgeon: Julieanne Cotton, MD;  Location: [facility];  Service: Radiology;  Laterality: N/A;   RADIOLOGY WITH ANESTHESIA N/A 10/16/2018   Procedure: STENTING;  Surgeon: Julieanne Cotton, MD;  Location: [facility];   Service: Radiology;  Laterality: N/A;   skin cancer excised  04/2013   Melonoma   SPINE SURGERY     cervical fusion, returned to OR for post op  bleeding   TONSILLECTOMY     TONSILLECTOMY     TUBAL LIGATION     VAGINA SURGERY     anterior posterior repair   Family History  Problem Relation Age of Onset   Heart disease Father    Heart disease Sister    Heart attack Sister    Heart disease Brother    Heart attack Brother    Stroke Son    Hyperlipidemia Other    Hypertension Other    Cancer Other        lung, esophagus, stomach   Stroke Other    Breast cancer Neg Hx    Social History   Socioeconomic History   Marital status: Divorced    Spouse name: Not on file   Number of children: 2   Years of education: 14   Highest education level: Not on file  Occupational History   Occupation: Academic librarian: [employer]    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[employer], retired  Tobacco Use   Smoking status: Former    Current packs/day: 0.00    Average packs/day: 2.0 packs/day for 22.0 years (44.0 ttl pk-yrs)    Types: Cigarettes    Start date: 04/24/1963    Quit date: 04/23/1985    Years since quitting: 37.8   Smokeless tobacco: Never  Vaping Use   Vaping status: Never Used  Substance and Sexual Activity   Alcohol use: Yes    Alcohol/week: 4.0 standard drinks of alcohol    Types: 4 Glasses of wine per week    Comment: 1 drink a  day   Drug use: No   Sexual activity: Not Currently    Birth control/protection: Surgical  Other Topics Concern   Not on file  Social History Narrative   ** Merged History Encounter **  Domestic partner   Regular exercise-no   Right handed   Caffeine use-- 2 cups coffee daily, rare soda   Social Determinants of Health   Financial Resource Strain: Medium Risk (01/31/2023)   Overall Financial Resource Strain (CARDIA)    Difficulty of Paying Living Expenses: Somewhat hard  Food Insecurity: No Food Insecurity (01/31/2023)   Hunger  Vital Sign    Worried About Running Out of Food in the Last Year: Never true    Ran Out of Food in the Last Year: Never true  Transportation Needs: No Transportation Needs (01/31/2023)   PRAPARE - Administrator, Civil Service (Medical): No    Lack of Transportation (Non-Medical): No  Physical Activity: Unknown (01/31/2023)   Exercise Vital Sign    Days of Exercise per Week: Patient declined    Minutes of Exercise per Session: 0 min  Stress: Stress Concern Present (01/31/2023)   [employer] of Occupational Health - Occupational Stress Questionnaire    Feeling of Stress : To some extent  Social Connections: Unknown (01/31/2023)   Social Connection and Isolation Panel [NHANES]    Frequency of Communication with Friends and Family: More than three times a week    Frequency of Social Gatherings with Friends and Family: More than three times a week    Attends Religious Services: Not on Marketing executive or Organizations: No    Attends Engineer, structural: Patient declined    Marital Status: Divorced    Tobacco Counseling Counseling given: Not Answered   Clinical Intake:  Pre-visit preparation completed: Yes  Pain : 0-10 Pain Score: 3  Pain Location: Back Pain Orientation: Lower     BMI - recorded: 27.14 Nutritional Status: BMI 25 -29 Overweight Nutritional Risks: None Diabetes: No  How often do you need to have someone help you when you read instructions, pamphlets, or other written materials from your doctor or pharmacy?: 1 - Never What is the last grade level 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   Activities of Daily Living    01/31/2023    2:34 PM 01/30/2023    9:15 PM  In your present state of health, do you have any difficulty performing the following activities:  Hearing? 1 1  Vision? 0 0  Difficulty concentrating or making decisions? 0  0  Walking or climbing stairs? 1 1  Dressing or bathing? 0 0  Doing errands, shopping? 0 0  Preparing Food and eating ? N N  Using the Toilet? N N  In the past six months, have you accidently leaked urine? Y Y  Do you have problems with loss of bowel control? Y Y  Managing your Medications? N N  Managing your Finances? N N  Housekeeping or managing your Housekeeping? N N    Patient Care Team: Pincus Sanes, MD as PCP - General (Internal Medicine) Quintella Reichert, MD as PCP - Cardiology (Cardiology) Marzella Schlein., MD as Consulting Physician (Ophthalmology)  Indicate any recent Medical Services you may have received from other than [facility] providers in the past year (date may be approximate).     Assessment:   This is a routine wellness examination for [place].  Hearing/Vision screen Hearing Screening - Comments:: Patient has hearing difficulty and wears hearing aids. Vision Screening - Comments:: Patient does wear readers.  Cataracts removed. Patient has macular degeneration Eye exam done by: Dr.  Candise Che (seen every 6 months)    Goals Addressed             This Visit's Progress    Maintain my health  and gain strength in my hands.        Depression Screen    01/31/2023    2:33 PM 11/22/2022   10:09 AM 11/21/2021    3:50 PM 11/16/2021   10:45 AM 11/11/2020    1:36 PM 09/17/2020    1:43 PM 05/13/2020   10:19 AM  PHQ 2/9 Scores  PHQ - 2 Score 0 0 0 0 0 0 0  PHQ- 9 Score 0 0 0 0 2  0    Fall Risk    01/31/2023    2:34 PM 01/30/2023    9:15 PM 11/22/2022   10:09 AM 11/21/2021    3:53 PM 11/16/2021   10:47 AM  Fall Risk   Falls in the past year? 1 1 0 0 1  Number falls in past yr: 0 0 0 0 0  Injury with Fall? 0 0 0 0 0  Risk for fall due to : No Fall Risks  No Fall Risks  No Fall Risks  Follow up Falls prevention discussed  Falls evaluation completed  Falls evaluation completed    MEDICARE RISK AT HOME: Medicare Risk at Home Any stairs in or around the home?: No If  so, are there any without handrails?: No Home free of loose throw rugs in walkways, pet beds, electrical cords, etc?: Yes Adequate lighting in your home to reduce risk of falls?: Yes Life alert?: No Use of a cane, walker or w/c?: No Grab bars in the bathroom?: No Shower chair or bench in shower?: Yes Elevated toilet seat or a handicapped toilet?: Yes  TIMED UP AND GO:  Was the test performed?  Yes  Length of time to ambulate 10 feet: 8 sec Gait steady and fast without use of assistive device    Cognitive Function:  Normal cognitive status assessed by direct observation by this Nurse Health Advisor. No abnormalities found.      01/31/2023    3:11 PM  MMSE - Mini Mental State Exam  Orientation to time 5  Orientation to Place 5  Registration 3  Attention/ Calculation 5  Recall 3  Language- name 2 objects 2  Language- repeat 1  Language- follow 3 step command 3  Language- read & follow direction 1  Write a sentence 1  Copy design 1  Total score 30        01/31/2023    2:35 PM 11/21/2021    3:53 PM 07/29/2019   11:39 AM  6CIT Screen  What Year? 0 points 0 points 0 points  What month? 0 points 0 points 0 points  What time? 0 points 0 points 0 points  Count back from 20 0 points 0 points 0 points  Months in reverse 0 points 0 points 0 points  Repeat phrase 0 points 0 points 0 points  Total Score 0 points 0 points 0 points    Immunizations Immunization History  Administered Date(s) Administered   Fluad Quad(high Dose 65+) 02/03/2021, 03/05/2022   Influenza Whole 02/14/2012   Influenza, High Dose Seasonal PF 01/19/2017, 02/08/2018, 01/28/2020, 01/28/2023   Influenza, Quadrivalent, Recombinant, Inj, Pf 01/18/2019   Influenza,inj,Quad PF,6+ Mos 01/06/2016   Influenza,inj,quad, With Preservative 01/12/2014   Influenza-Unspecified 01/15/2013, 02/03/2015, 01/19/2017   PFIZER Comirnaty(Gray Top)Covid-19 Tri-Sucrose Vaccine 07/31/2020   PFIZER(Purple Top)SARS-COV-2  Vaccination 05/02/2019,  05/23/2019, 02/21/2020   Pfizer Covid-19 Vaccine Bivalent Booster 36yrs & up 01/21/2021   Pneumococcal Conjugate-13 04/28/2009, 01/12/2014   Pneumococcal Polysaccharide-23 11/06/2018   Td 04/18/2007   Tdap 06/14/2021   Zoster Recombinant(Shingrix) 01/18/2019, 04/18/2019   Zoster, Live 04/28/2009    TDAP status: Up to date  Flu Vaccine status: Up to date  Pneumococcal vaccine status: Up to date  Covid-19 vaccine status: Completed vaccines  Qualifies for Shingles Vaccine? Yes   Zostavax completed Yes   Shingrix Completed?: Yes  Screening Tests Health Maintenance  Topic Date Due   COVID-19 Vaccine (6 - 2023-24 season) 12/17/2022   DEXA SCAN  01/13/2024   Medicare Annual Wellness (AWV)  01/31/2024   DTaP/Tdap/Td (3 - Td or Tdap) 06/15/2031   Pneumonia Vaccine 13+ Years old  Completed   INFLUENZA VACCINE  Completed   Hepatitis C Screening  Completed   Zoster Vaccines- Shingrix  Completed   HPV VACCINES  Aged Out   Colonoscopy  Discontinued    Health Maintenance  Health Maintenance Due  Topic Date Due   COVID-19 Vaccine (6 - 2023-24 season) 12/17/2022    Colorectal cancer screening: No longer required.   Mammogram status: Completed 06/14/2022. Repeat every year  Bone Density status: Completed 01/13/2019. Results reflect: Bone density results: NORMAL. Repeat every 5 years.  Lung Cancer Screening: (Low Dose CT Chest recommended if Age 65-80 years, 20 pack-year currently smoking OR have quit w/in 15years.) does not qualify.   Lung Cancer Screening Referral: no  Additional Screening:  Hepatitis C Screening: does qualify; Completed 11/11/2020  Vision Screening: Recommended annual ophthalmology exams for early detection of glaucoma and other disorders of the eye. Is the patient up to date with their annual eye exam?  Yes  Who is the provider or what is the name of the office in which the patient attends annual eye exams? Dr. Candise Che If pt is  not established with a provider, would they like to be referred to a provider to establish care? No .   Dental Screening: Recommended annual dental exams for proper oral hygiene  Diabetic Foot Exam: N/A  Community Resource Referral / Chronic Care Management: CRR required this visit?  No   CCM required this visit?  No     Plan:     I have personally reviewed and noted the following in the patient's chart:   Medical and social history Use of alcohol, tobacco or illicit drugs  Current medications and supplements including opioid prescriptions. Patient is not currently taking opioid prescriptions. Functional ability and status Nutritional status Physical activity Advanced directives List of other physicians Hospitalizations, surgeries, and ER visits in previous 12 months Vitals Screenings to include cognitive, depression, and falls Referrals and appointments  In addition, I have reviewed and discussed with patient certain preventive protocols, quality metrics, and best practice recommendations. A written personalized care plan for preventive services as well as general preventive health recommendations were provided to patient.     Mickeal Needy, LPN   16/01/9603   After Visit Summary: (In Person-Printed) AVS printed and given to the patient  Nurse Notes: N/A

## 2023-02-20 ENCOUNTER — Encounter: Payer: Self-pay | Admitting: Obstetrics and Gynecology

## 2023-02-20 ENCOUNTER — Ambulatory Visit: Payer: PPO | Admitting: Obstetrics and Gynecology

## 2023-02-20 VITALS — BP 127/57 | HR 71

## 2023-02-20 DIAGNOSIS — R159 Full incontinence of feces: Secondary | ICD-10-CM

## 2023-02-20 DIAGNOSIS — N3281 Overactive bladder: Secondary | ICD-10-CM

## 2023-02-20 MED ORDER — TROSPIUM CHLORIDE ER 60 MG PO CP24: 1.0000 | ORAL_CAPSULE | Freq: Every day | ORAL | 11 refills | Status: DC

## 2023-02-20 NOTE — Progress Notes (Signed)
[facility] Return Visit  SUBJECTIVE  History of Present Illness: Susan Gillespie is a 77 y.o. female seen in follow-up for overactive bladder, fecal incontinence and recurrent UTI. Plan at last visit was to start Gemtesa and fiber supplement. Previously took [employer].   She tried samples of medication but the medication came out to be $250 a month. She feels that is did help a little more than the myrbetriq.  She still has episodes at night where she has urinary frequency and urgency/ spasm.   She has some constipation recently and took some miralax but it caused her to go multiple times in a day. She tried fiber supplement previously but not recently . Has not had any bowel leakage recently- usually only when she has an alcoholic beverage.   Has been using the estrogen cream around the urethra and vulva. Does not have any current symptoms of urinary tract infection.   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   Past Surgical History: She  has a past surgical history that includes Spine surgery; Colon surgery; Abdominal hysterectomy; Tonsillectomy; Cholecystectomy; Appendectomy; skin cancer excised (04/2013); Tonsillectomy; Breast surgery; Vagina surgery; Joint replacement (Left); Eye surgery; Tubal ligation; Radiology with  anesthesia (N/A, 11/09/2014); Mastoidectomy revision; Mastoid debridement; Colonoscopy w/ polypectomy; Radiology with anesthesia (N/A, 05/31/2015); ir generic historical (12/24/2015); ir generic historical (12/24/2015); ir generic historical (12/24/2015); Radiology with anesthesia (N/A, 01/05/2016); ir generic historical (01/05/2016); ir generic historical (03/07/2016); Angioplasty; Esophagogastroduodenoscopy (egd) with propofol (N/A, 05/05/2016); Colonoscopy with propofol (N/A, 05/05/2016); Breast excisional biopsy (Left); LEFT HEART CATH AND CORONARY ANGIOGRAPHY (N/A, 11/10/2016); Augmentation mammaplasty (Bilateral); Radiology with anesthesia (N/A, 10/16/2018); IR ANGIO INTRA EXTRACRAN SEL COM CAROTID INNOMINATE BILAT MOD SED (10/16/2018); IR ANGIO VERTEBRAL SEL SUBCLAVIAN INNOMINATE UNI L MOD SED (10/16/2018); IR ANGIO VERTEBRAL SEL VERTEBRAL UNI R MOD SED (10/16/2018); and IR Radiologist Eval & Mgmt (11/30/2022).   Medications: She has a current medication list which includes the following prescription(s): alprazolam, cetirizine, clopidogrel, diclofenac, diltiazem, estradiol, famotidine, ferrous sulfate, fluticasone, furosemide, losartan, methocarbamol, metoprolol succinate, potassium chloride, rosuvastatin, sumatriptan, trazodone, trospium chloride, and vitamin b-12.   Allergies: Patient is allergic to phenergan [promethazine hcl], daypro [oxaprozin], lisinopril, oxycodone, simvastatin, and tramadol.   Social History: Patient  reports that she quit smoking about 37 years ago. Her smoking use included cigarettes. She started smoking about 59 years ago. She has a 44 pack-year smoking history. She has never used smokeless tobacco. She reports current alcohol use of about 4.0 standard drinks of alcohol per week. She reports that she does not use drugs.      OBJECTIVE     Physical Exam: Vitals:   02/20/23 0847  BP: (!) 127/57  Pulse: 71   Gen: No apparent distress, A&O x 3.  Detailed  Urogynecologic Evaluation:  Deferred.    ASSESSMENT AND PLAN    Ms. Hickel is a 77 y.o. with:  1. OAB (overactive bladder)     - For refractory OAB we reviewed the procedure for intravesical Botox injection with  cystoscopy in the office and reviewed the risks, benefits and alternatives of treatment including but not limited to infection, need for self-catheterization and need for repeat therapy.  We discussed that there is a 5-15% chance of needing to catheterize with Botox and that this usually resolves in a few months; however can persist for longer periods of time.  Typically Botox injections would need to be repeated every 3-12 months since this is not a permanent therapy.  - We discussed the role of sacral neuromodulation and how it works. It requires a test phase, and documentation of bladder function via diary. After a successful test period, a permanent wire and generator are placed in the OR. The battery lasts 5 years on average and would need to be replaced surgically.  The goal of this therapy is at least a 50% improvement in symptoms. It is NOT realistic to expect a 100% cure.  We reviewed the fact that about 30% of patients fail the test phase and are not candidates for permanent generator placement.   - We also discussed the role of percutaneous tibial nerve stimulation and how it works.  She understands it requires 12 weekly visits for temporary neuromodulation of the sacral nerve roots via the tibial nerve and that she may then require continued tapered treatment.   - She is concerned that because she is on plavix and that she bruises easily that she may have an issue with a larger surgery (previously developed hematomas during her surgeries). For this reason, she wants to try another medication. Would consider PTNS since it is less invasive.  - Will start trospium 60mg  ER daily.  - Recommend avoiding alcohol as this exacerbates her FI symptoms.   Return 6 weeks  Marguerita Beards, MD

## 2023-02-26 ENCOUNTER — Other Ambulatory Visit: Payer: Self-pay | Admitting: Internal Medicine

## 2023-03-05 ENCOUNTER — Other Ambulatory Visit: Payer: Self-pay | Admitting: Medical Genetics

## 2023-03-05 DIAGNOSIS — Z006 Encounter for examination for normal comparison and control in clinical research program: Secondary | ICD-10-CM

## 2023-03-06 ENCOUNTER — Other Ambulatory Visit (HOSPITAL_COMMUNITY)
Admission: RE | Admit: 2023-03-06 | Discharge: 2023-03-06 | Disposition: A | Discharge status: Home / Self Care | Payer: Self-pay | Source: Ambulatory Visit | Attending: Oncology | Admitting: Oncology

## 2023-03-06 DIAGNOSIS — Z006 Encounter for examination for normal comparison and control in clinical research program: Secondary | ICD-10-CM | POA: Insufficient documentation

## 2023-03-13 ENCOUNTER — Encounter: Payer: Self-pay | Admitting: Internal Medicine

## 2023-03-14 ENCOUNTER — Other Ambulatory Visit: Payer: Self-pay

## 2023-03-14 MED ORDER — MYRBETRIQ 50 MG PO TB24: 50.0000 mg | ORAL_TABLET | Freq: Every day | ORAL | 2 refills | Status: DC

## 2023-03-14 MED ORDER — MIRABEGRON ER 50 MG PO TB24: 50.0000 mg | ORAL_TABLET | Freq: Every day | ORAL | 2 refills | Status: DC

## 2023-03-20 LAB — GENECONNECT MOLECULAR SCREEN: Genetic Analysis Overall Interpretation: NEGATIVE

## 2023-04-03 ENCOUNTER — Ambulatory Visit: Payer: PPO | Admitting: Obstetrics and Gynecology

## 2023-04-05 DIAGNOSIS — M533 Sacrococcygeal disorders, not elsewhere classified: Secondary | ICD-10-CM | POA: Diagnosis not present

## 2023-04-11 ENCOUNTER — Other Ambulatory Visit: Payer: Self-pay | Admitting: Cardiology

## 2023-04-25 DIAGNOSIS — H18593 Other hereditary corneal dystrophies, bilateral: Secondary | ICD-10-CM | POA: Diagnosis not present

## 2023-05-01 DIAGNOSIS — H18593 Other hereditary corneal dystrophies, bilateral: Secondary | ICD-10-CM | POA: Diagnosis not present

## 2023-05-22 ENCOUNTER — Encounter: Payer: Self-pay | Admitting: Internal Medicine

## 2023-05-22 NOTE — Progress Notes (Signed)
 Subjective:    Patient ID: Susan Gillespie, female    DOB: Aug 11, 1945, 78 y.o.   MRN: [id number]      HPI Hannahgrace is here for a Physical exam and her chronic medical problems.    A couple of months ago she fell and landed on her back.  Since then she has had intermittent pain with certain movements - occurs several times during a day.  Pain is in the right lower back.  Not a spasm - just pain.  Had an epidural several weeks ago  - did not do anything.  Weight loss - not trying.  Active.  No regular exercise. Had GI bug a few weeks ago which did result in some weight loss but since then has been eating normally.     Medications and allergies reviewed with patient and updated if appropriate.  Current Outpatient Medications on File Prior to Visit  Medication Sig Dispense Refill   ALPRAZolam  (XANAX ) 0.5 MG tablet TAKE 1/2 TO 1 TABLET BY MOUTH AT BEDTIME AS NEEDED FOR ANXIETY OR SLEEP 30 tablet 5   cetirizine (ZYRTEC) 10 MG tablet Take 10 mg by mouth every morning.     clopidogrel  (PLAVIX ) 75 MG tablet TAKE 1 TABLET BY MOUTH EVERY DAY 90 tablet 1   diclofenac  (VOLTAREN ) 50 MG EC tablet Take 1 tablet (50 mg total) by mouth daily as needed. 90 tablet 1   diltiazem  (CARDIZEM  CD) 180 MG 24 hr capsule TAKE 1 CAPSULE (180 MG TOTAL) BY MOUTH AT BEDTIME. 90 capsule 2   estradiol  (ESTRACE ) 0.1 MG/GM vaginal cream PLACE APPLICATORFUL VAGINALLY TWICE WEEKLY 127.5 g 4   famotidine  (PEPCID ) 40 MG tablet TAKE 1 TABLET BY MOUTH EVERY DAY 90 tablet 2   ferrous sulfate  325 (65 FE) MG tablet Take 325 mg by mouth daily with breakfast.     fluticasone  (FLONASE ) 50 MCG/ACT nasal spray Place into both nostrils daily.     furosemide  (LASIX ) 20 MG tablet TAKE 1 TABLET (20 MG TOTAL) BY MOUTH DAILY. MAY TAKE AN ADDITIONAL TABLET FOR EDEMA 135 tablet 2   losartan  (COZAAR ) 100 MG tablet TAKE 1 TABLET BY MOUTH EVERY DAY 90 tablet 1   methocarbamol  (ROBAXIN ) 500 MG tablet TAKE 1 TABLET (500 MG TOTAL) BY MOUTH DAILY AS  NEEDED FOR MUSCLE SPASMS. 60 tablet 2   metoprolol  succinate (TOPROL -XL) 50 MG 24 hr tablet Take 50 mg by mouth as needed (svt). Take with or immediately following a meal. -     MYRBETRIQ  50 MG TB24 tablet Take 1 tablet (50 mg total) by mouth daily. 90 tablet 2   potassium chloride  (KLOR-CON ) 10 MEQ tablet TAKE 1 TABLET BY MOUTH EVERY DAY 90 tablet 1   rosuvastatin  (CRESTOR ) 20 MG tablet TAKE 1 TABLET BY MOUTH EVERYDAY AT BEDTIME 90 tablet 1   SUMAtriptan  (IMITREX ) 50 MG tablet Take 50 mg by mouth every 2 (two) hours as needed for migraine. May repeat in 2 hours if headache persists or recurs.     traZODone  (DESYREL ) 100 MG tablet TAKE 1 TABLET AT BEDTIME. PER MD RETURN IN ABOUT 6 MONTHS (AROUND 11/22/2022) FOR PHYSICAL EXAM. 90 tablet 1   vitamin B-12 (CYANOCOBALAMIN ) 500 MCG tablet Take 500 mcg by mouth daily.     No current facility-administered medications on file prior to visit.    Review of Systems  Constitutional:  Negative for chills, diaphoresis and fever.  Eyes:  Negative for visual disturbance.  Respiratory:  Negative for cough, shortness of  breath and wheezing.   Cardiovascular:  Positive for palpitations (SVT occ). Negative for chest pain and leg swelling.  Gastrointestinal:  Negative for abdominal pain, constipation, diarrhea and nausea.  Genitourinary:  Negative for dysuria.       Urine with odor  Musculoskeletal:  Positive for arthralgias (OA) and back pain (R SI joint).  Skin:  Negative for rash.  Neurological:  Positive for dizziness (occ in shower). Negative for light-headedness and headaches.  Psychiatric/Behavioral:  Positive for sleep disturbance. Negative for dysphoric mood. The patient is nervous/anxious.        Objective:   Vitals:   05/23/23 0926  BP: (!) 142/70  Pulse: 70  Temp: 98.1 F (36.7 C)  SpO2: 96%   Filed Weights   05/23/23 0926  Weight: 143 lb (64.9 kg)   Body mass index is 25.9 kg/m.  BP Readings from Last 3 Encounters:  05/23/23 (!)  142/70  02/20/23 (!) 127/57  01/31/23 (!) 120/59    Wt Readings from Last 3 Encounters:  05/23/23 143 lb (64.9 kg)  01/31/23 149 lb 12.8 oz (67.9 kg)  12/29/22 148 lb (67.1 kg)       Physical Exam Constitutional: She appears well-developed and well-nourished. No distress.  HENT:  Head: Normocephalic and atraumatic.  Right Ear: External ear normal. TM Left Ear: External ear normal.   Mouth/Throat: Oropharynx is clear and moist.  Eyes: Conjunctivae normal.  Neck: Neck supple. No tracheal deviation present. No thyromegaly present.  Cardiovascular: Normal rate, regular rhythm and normal heart sounds.   2/6 murmur heard.  No edema. Pulmonary/Chest: Effort normal and breath sounds normal. No respiratory distress. She has no wheezes. She has no rales.  Breast: deferred   Abdominal: Soft. She exhibits no distension. There is no tenderness.  MSK:  no L-spine tenderness, R SI joint tenderness Lymphadenopathy: She has no cervical adenopathy.  Skin: Skin is warm and dry. She is not diaphoretic.  Psychiatric: She has a normal mood and affect. Her behavior is normal.     Lab Results  Component Value Date   WBC 5.2 11/22/2022   HGB 12.1 11/22/2022   HCT 36.7 11/22/2022   PLT 261.0 11/22/2022   GLUCOSE 114 (H) 11/22/2022   CHOL 161 11/22/2022   TRIG 195.0 (H) 11/22/2022   HDL 78.70 11/22/2022   LDLDIRECT 50.0 10/30/2018   LDLCALC 44 11/22/2022   ALT 13 11/22/2022   AST 16 11/22/2022   NA 139 11/22/2022   K 4.0 11/22/2022   CL 102 11/22/2022   CREATININE 1.20 11/22/2022   BUN 17 11/22/2022   CO2 30 11/22/2022   TSH 2.76 05/24/2022   INR 1.0 10/16/2018   HGBA1C 6.3 11/22/2022         Assessment & Plan:   Physical exam: Screening blood work  ordered Exercise  active  Weight normal Substance abuse  none   Reviewed recommended immunizations.   Health Maintenance  Topic Date Due   COVID-19 Vaccine (6 - 2024-25 season) 12/17/2022   DEXA SCAN  01/13/2024    Medicare Annual Wellness (AWV)  01/31/2024   DTaP/Tdap/Td (3 - Td or Tdap) 06/15/2031   Pneumonia Vaccine 54+ Years old  Completed   INFLUENZA VACCINE  Completed   Hepatitis C Screening  Completed   Zoster Vaccines- Shingrix  Completed   HPV VACCINES  Aged Out   Colonoscopy  Discontinued          See Problem List for Assessment and Plan of chronic medical problems.

## 2023-05-22 NOTE — Patient Instructions (Addendum)
 Blood work was ordered.   Xrays ordered.      Medications changes include :   oxycodone  as needed for severe pain     Return in about 6 months (around 11/20/2023) for follow up.    Health Maintenance, Female Adopting a healthy lifestyle and getting preventive care are important in promoting health and wellness. Ask your health care provider about: The right schedule for you to have regular tests and exams. Things you can do on your own to prevent diseases and keep yourself healthy. What should I know about diet, weight, and exercise? Eat a healthy diet  Eat a diet that includes plenty of vegetables, fruits, low-fat dairy products, and lean protein. Do not eat a lot of foods that are high in solid fats, added sugars, or sodium. Maintain a healthy weight Body mass index (BMI) is used to identify weight problems. It estimates body fat based on height and weight. Your health care provider can help determine your BMI and help you achieve or maintain a healthy weight. Get regular exercise Get regular exercise. This is one of the most important things you can do for your health. Most adults should: Exercise for at least 150 minutes each week. The exercise should increase your heart rate and make you sweat (moderate-intensity exercise). Do strengthening exercises at least twice a week. This is in addition to the moderate-intensity exercise. Spend less time sitting. Even light physical activity can be beneficial. Watch cholesterol and blood lipids Have your blood tested for lipids and cholesterol at 80 years of age, then have this test every 5 years. Have your cholesterol levels checked more often if: Your lipid or cholesterol levels are high. You are older than 80 years of age. You are at high risk for heart disease. What should I know about cancer screening? Depending on your health history and family history, you may need to have cancer screening at various ages. This may  include screening for: Breast cancer. Cervical cancer. Colorectal cancer. Skin cancer. Lung cancer. What should I know about heart disease, diabetes, and high blood pressure? Blood pressure and heart disease High blood pressure causes heart disease and increases the risk of stroke. This is more likely to develop in people who have high blood pressure readings or are overweight. Have your blood pressure checked: Every 3-5 years if you are 80-60 years of age. Every year if you are 80 years old or older. Diabetes Have regular diabetes screenings. This checks your fasting blood sugar level. Have the screening done: Once every three years after age 80 if you are at a normal weight and have a low risk for diabetes. More often and at a younger age if you are overweight or have a high risk for diabetes. What should I know about preventing infection? Hepatitis B If you have a higher risk for hepatitis B, you should be screened for this virus. Talk with your health care provider to find out if you are at risk for hepatitis B infection. Hepatitis C Testing is recommended for: Everyone born from [birth year] through 1967. Anyone with known risk factors for hepatitis C. Sexually transmitted infections (STIs) Get screened for STIs, including gonorrhea and chlamydia, if: You are sexually active and are younger than 80 years of age. You are older than 80 years of age and your health care provider tells you that you are at risk for this type of infection. Your sexual activity has changed since you were last screened, and  you are at increased risk for chlamydia or gonorrhea. Ask your health care provider if you are at risk. Ask your health care provider about whether you are at high risk for HIV. Your health care provider may recommend a prescription medicine to help prevent HIV infection. If you choose to take medicine to prevent HIV, you should first get tested for HIV. You should then be tested every 3 months  for as long as you are taking the medicine. Pregnancy If you are about to stop having your period (premenopausal) and you may become pregnant, seek counseling before you get pregnant. Take 400 to 800 micrograms (mcg) of folic acid every day if you become pregnant. Ask for birth control (contraception) if you want to prevent pregnancy. Osteoporosis and menopause Osteoporosis is a disease in which the bones lose minerals and strength with aging. This can result in bone fractures. If you are 65 years old or older, or if you are at risk for osteoporosis and fractures, ask your health care provider if you should: Be screened for bone loss. Take a calcium  or vitamin D  supplement to lower your risk of fractures. Be given hormone replacement therapy (HRT) to treat symptoms of menopause. Follow these instructions at home: Alcohol use Do not drink alcohol if: Your health care provider tells you not to drink. You are pregnant, may be pregnant, or are planning to become pregnant. If you drink alcohol: Limit how much you have to: 0-1 drink a day. Know how much alcohol is in your drink. In the [place] one drink equals one 12 oz bottle of beer (355 mL), one 5 oz glass of wine (148 mL), or one 1 oz glass of hard liquor (44 mL). Lifestyle Do not use any products that contain nicotine or tobacco. These products include cigarettes, chewing tobacco, and vaping devices, such as e-cigarettes. If you need help quitting, ask your health care provider. Do not use street drugs. Do not share needles. Ask your health care provider for help if you need support or information about quitting drugs. General instructions Schedule regular health, dental, and eye exams. Stay current with your vaccines. Tell your health care provider if: You often feel depressed. You have ever been abused or do not feel safe at home. Summary Adopting a healthy lifestyle and getting preventive care are important in promoting health and  wellness. Follow your health care provider's instructions about healthy diet, exercising, and getting tested or screened for diseases. Follow your health care provider's instructions on monitoring your cholesterol and blood pressure. This information is not intended to replace advice given to you by your health care provider. Make sure you discuss any questions you have with your health care provider. Document Revised: 08/23/2020 Document Reviewed: 08/23/2020 Elsevier Patient Education  2024 [employer].

## 2023-05-23 ENCOUNTER — Ambulatory Visit (INDEPENDENT_AMBULATORY_CARE_PROVIDER_SITE_OTHER): Payer: HMO

## 2023-05-23 ENCOUNTER — Ambulatory Visit (INDEPENDENT_AMBULATORY_CARE_PROVIDER_SITE_OTHER): Payer: HMO | Admitting: Internal Medicine

## 2023-05-23 VITALS — BP 142/70 | HR 70 | Temp 98.1°F | Ht 62.3 in | Wt 143.0 lb

## 2023-05-23 DIAGNOSIS — K589 Irritable bowel syndrome without diarrhea: Secondary | ICD-10-CM | POA: Diagnosis not present

## 2023-05-23 DIAGNOSIS — N3289 Other specified disorders of bladder: Secondary | ICD-10-CM | POA: Diagnosis not present

## 2023-05-23 DIAGNOSIS — I10 Essential (primary) hypertension: Secondary | ICD-10-CM | POA: Diagnosis not present

## 2023-05-23 DIAGNOSIS — M47816 Spondylosis without myelopathy or radiculopathy, lumbar region: Secondary | ICD-10-CM | POA: Diagnosis not present

## 2023-05-23 DIAGNOSIS — Z0000 Encounter for general adult medical examination without abnormal findings: Secondary | ICD-10-CM | POA: Diagnosis not present

## 2023-05-23 DIAGNOSIS — F418 Other specified anxiety disorders: Secondary | ICD-10-CM

## 2023-05-23 DIAGNOSIS — M545 Low back pain, unspecified: Secondary | ICD-10-CM

## 2023-05-23 DIAGNOSIS — M4316 Spondylolisthesis, lumbar region: Secondary | ICD-10-CM | POA: Diagnosis not present

## 2023-05-23 DIAGNOSIS — M150 Primary generalized (osteo)arthritis: Secondary | ICD-10-CM | POA: Diagnosis not present

## 2023-05-23 DIAGNOSIS — G8929 Other chronic pain: Secondary | ICD-10-CM | POA: Diagnosis not present

## 2023-05-23 DIAGNOSIS — D519 Vitamin B12 deficiency anemia, unspecified: Secondary | ICD-10-CM

## 2023-05-23 DIAGNOSIS — I2510 Atherosclerotic heart disease of native coronary artery without angina pectoris: Secondary | ICD-10-CM

## 2023-05-23 DIAGNOSIS — G4709 Other insomnia: Secondary | ICD-10-CM

## 2023-05-23 DIAGNOSIS — K588 Other irritable bowel syndrome: Secondary | ICD-10-CM

## 2023-05-23 DIAGNOSIS — R7303 Prediabetes: Secondary | ICD-10-CM | POA: Diagnosis not present

## 2023-05-23 DIAGNOSIS — I5032 Chronic diastolic (congestive) heart failure: Secondary | ICD-10-CM

## 2023-05-23 DIAGNOSIS — M549 Dorsalgia, unspecified: Secondary | ICD-10-CM | POA: Diagnosis not present

## 2023-05-23 DIAGNOSIS — M533 Sacrococcygeal disorders, not elsewhere classified: Secondary | ICD-10-CM | POA: Diagnosis not present

## 2023-05-23 DIAGNOSIS — E785 Hyperlipidemia, unspecified: Secondary | ICD-10-CM

## 2023-05-23 DIAGNOSIS — K219 Gastro-esophageal reflux disease without esophagitis: Secondary | ICD-10-CM

## 2023-05-23 DIAGNOSIS — M48061 Spinal stenosis, lumbar region without neurogenic claudication: Secondary | ICD-10-CM | POA: Diagnosis not present

## 2023-05-23 DIAGNOSIS — M160 Bilateral primary osteoarthritis of hip: Secondary | ICD-10-CM | POA: Diagnosis not present

## 2023-05-23 LAB — LIPID PANEL
Cholesterol: 137 mg/dL (ref 0–200)
HDL: 70.5 mg/dL (ref >39.00)
LDL Cholesterol: 37 mg/dL (ref 0–99)
NonHDL: 66.25
Total CHOL/HDL Ratio: 2
Triglycerides: 144 mg/dL (ref 0.0–149.0)
VLDL: 28.8 mg/dL (ref 0.0–40.0)

## 2023-05-23 LAB — COMPREHENSIVE METABOLIC PANEL
ALT: 9 U/L (ref 0–35)
AST: 14 U/L (ref 0–37)
Albumin: 4.1 g/dL (ref 3.5–5.2)
Alkaline Phosphatase: 47 U/L (ref 39–117)
BUN: 18 mg/dL (ref 6–23)
CO2: 28 meq/L (ref 19–32)
Calcium: 9.3 mg/dL (ref 8.4–10.5)
Chloride: 106 meq/L (ref 96–112)
Creatinine, Ser: 1.03 mg/dL (ref 0.40–1.20)
GFR: 51.86 mL/min — ABNORMAL LOW (ref >60.00)
Glucose, Bld: 105 mg/dL — ABNORMAL HIGH (ref 70–99)
Potassium: 4.3 meq/L (ref 3.5–5.1)
Sodium: 140 meq/L (ref 135–145)
Total Bilirubin: 0.4 mg/dL (ref 0.2–1.2)
Total Protein: 5.8 g/dL — ABNORMAL LOW (ref 6.0–8.3)

## 2023-05-23 LAB — CBC WITH DIFFERENTIAL/PLATELET
Basophils Absolute: 0 10*3/uL (ref 0.0–0.1)
Basophils Relative: 0.8 % (ref 0.0–3.0)
Eosinophils Absolute: 0.1 10*3/uL (ref 0.0–0.7)
Eosinophils Relative: 2.7 % (ref 0.0–5.0)
HCT: 33.4 % — ABNORMAL LOW (ref 36.0–46.0)
Hemoglobin: 11.2 g/dL — ABNORMAL LOW (ref 12.0–15.0)
Lymphocytes Relative: 18.8 % (ref 12.0–46.0)
Lymphs Abs: 0.8 10*3/uL (ref 0.7–4.0)
MCHC: 33.6 g/dL (ref 30.0–36.0)
MCV: 98.8 fL (ref 78.0–100.0)
Monocytes Absolute: 0.5 10*3/uL (ref 0.1–1.0)
Monocytes Relative: 12 % (ref 3.0–12.0)
Neutro Abs: 2.7 10*3/uL (ref 1.4–7.7)
Neutrophils Relative %: 65.7 % (ref 43.0–77.0)
Platelets: 280 10*3/uL (ref 150.0–400.0)
RBC: 3.38 Mil/uL — ABNORMAL LOW (ref 3.87–5.11)
RDW: 13 % (ref 11.5–15.5)
WBC: 4.2 10*3/uL (ref 4.0–10.5)

## 2023-05-23 LAB — VITAMIN B12: Vitamin B-12: 1150 pg/mL — ABNORMAL HIGH (ref 211–911)

## 2023-05-23 LAB — HEMOGLOBIN A1C: Hgb A1c MFr Bld: 6.3 % (ref 4.6–6.5)

## 2023-05-23 LAB — TSH: TSH: 1.51 u[IU]/mL (ref 0.35–5.50)

## 2023-05-23 MED ORDER — OXYCODONE-ACETAMINOPHEN 5-325 MG PO TABS: 1.0000 | ORAL_TABLET | Freq: Three times a day (TID) | ORAL | 0 refills | Status: AC | PRN

## 2023-05-23 NOTE — Assessment & Plan Note (Signed)
Chronic GERD controlled Continue famotidine 40 mg daily

## 2023-05-23 NOTE — Assessment & Plan Note (Signed)
 Chronic Lab Results  Component Value Date   HGBA1C 6.3 11/22/2022   Check a1c Low sugar / carb diet Stressed regular exercise

## 2023-05-23 NOTE — Assessment & Plan Note (Addendum)
 Chronic Blood pressure slightly elevated - likely related to pain CMP Continue Cardizem  180 mg daily, losartan  100 mg daily

## 2023-05-23 NOTE — Assessment & Plan Note (Signed)
 Chronic Not ideally controlled Has fecal urgency and fecal incontinence Has had partial colectomy Not currently seeing GI-discussed at her last visit possibly seeing them again Takes Imodium as needed At 1 point was on Lotronex which helped a lot with her bowel and even her bladder

## 2023-05-23 NOTE — Assessment & Plan Note (Signed)
 Chronic Taking B12 regularly Will check vitamin B12 level

## 2023-05-23 NOTE — Assessment & Plan Note (Addendum)
 Acute on chronic pain Has seen Dr. Rexanne Catalina in the past Stop diclofenac  50 mg due to CKD Start oxycodone -acetaminophen  5-325 mg - will 1/2 pill as needed - discussed side effects - does tolerate 1/2 pill  Xrays of L spine and SI joints

## 2023-05-23 NOTE — Assessment & Plan Note (Addendum)
Chronic Regular exercise and healthy diet encouraged Check lipid panel, CMP, TSH Continue Crestor 20 mg daily

## 2023-05-23 NOTE — Assessment & Plan Note (Signed)
 Chronic Follows with cardiology No symptoms consistent with angina Continue current medications including Plavix  75 mg daily, Crestor  20 mg daily and blood pressure medications

## 2023-05-23 NOTE — Assessment & Plan Note (Addendum)
 Chronic Somewhat controlled Still has spasms and incontinence Has IBS with frequent urgency and fecal incontinence Continue Myrbetriq  50 mg daily, vaginal estrogen Has seen urogynecology -  does not want any invasive treatment

## 2023-05-23 NOTE — Assessment & Plan Note (Signed)
 Chronic Taking diclofenac  50 mg daily prn only-takes approximately 2-3 times a month only

## 2023-05-23 NOTE — Assessment & Plan Note (Signed)
Chronic Euvolemic Following with cardiology Continue current medications-taking furosemide 20 mg daily

## 2023-05-23 NOTE — Assessment & Plan Note (Signed)
Chronic Intermittent Continue xanax 0.25-0.5 mg daily prn

## 2023-05-23 NOTE — Assessment & Plan Note (Signed)
Chronic Controlled, stable Continue trazodone 100 mg nightly 

## 2023-05-24 ENCOUNTER — Encounter: Payer: Self-pay | Admitting: Internal Medicine

## 2023-05-30 ENCOUNTER — Other Ambulatory Visit: Payer: Self-pay | Admitting: Internal Medicine

## 2023-05-30 DIAGNOSIS — Z1231 Encounter for screening mammogram for malignant neoplasm of breast: Secondary | ICD-10-CM

## 2023-06-03 ENCOUNTER — Encounter: Payer: Self-pay | Admitting: Internal Medicine

## 2023-06-06 ENCOUNTER — Encounter: Payer: Self-pay | Admitting: Internal Medicine

## 2023-06-07 ENCOUNTER — Other Ambulatory Visit (HOSPITAL_COMMUNITY): Payer: Self-pay | Admitting: Interventional Radiology

## 2023-06-07 DIAGNOSIS — I771 Stricture of artery: Secondary | ICD-10-CM

## 2023-06-09 ENCOUNTER — Other Ambulatory Visit: Payer: Self-pay | Admitting: Internal Medicine

## 2023-06-09 ENCOUNTER — Other Ambulatory Visit (HOSPITAL_COMMUNITY): Payer: Self-pay

## 2023-06-09 DIAGNOSIS — G4709 Other insomnia: Secondary | ICD-10-CM

## 2023-06-09 DIAGNOSIS — I5032 Chronic diastolic (congestive) heart failure: Secondary | ICD-10-CM

## 2023-06-09 DIAGNOSIS — E785 Hyperlipidemia, unspecified: Secondary | ICD-10-CM

## 2023-06-09 MED ORDER — ALPRAZOLAM 0.5 MG PO TABS
0.5000 mg | ORAL_TABLET | Freq: Every evening | ORAL | 3 refills | Status: DC | PRN
  Filled 2023-06-09 – 2023-07-05 (×3): qty 30, 30d supply, fill #0

## 2023-06-09 MED FILL — Methocarbamol Tab 500 MG: ORAL | 60 days supply | Qty: 60 | Fill #0 | Status: CN

## 2023-06-11 ENCOUNTER — Other Ambulatory Visit (HOSPITAL_COMMUNITY): Payer: Self-pay

## 2023-06-11 MED ORDER — ESTRADIOL 0.1 MG/GM VA CREA
TOPICAL_CREAM | VAGINAL | 4 refills | Status: DC
  Filled 2023-06-11: qty 127.5, 90d supply, fill #0

## 2023-06-11 MED ORDER — CLOPIDOGREL BISULFATE 75 MG PO TABS
75.0000 mg | ORAL_TABLET | Freq: Every day | ORAL | 1 refills | Status: DC
  Filled 2023-06-11 – 2023-06-18 (×3): qty 90, 90d supply, fill #0

## 2023-06-11 MED ORDER — POTASSIUM CHLORIDE ER 10 MEQ PO TBCR
10.0000 meq | EXTENDED_RELEASE_TABLET | Freq: Every day | ORAL | 1 refills | Status: DC
Start: 2023-06-11 — End: 2023-07-09
  Filled 2023-06-11 – 2023-06-18 (×4): qty 90, 90d supply, fill #0

## 2023-06-11 MED ORDER — ROSUVASTATIN CALCIUM 20 MG PO TABS
20.0000 mg | ORAL_TABLET | Freq: Every day | ORAL | 1 refills | Status: DC
  Filled 2023-06-11 – 2023-06-18 (×3): qty 90, 90d supply, fill #0

## 2023-06-11 MED ORDER — TRAZODONE HCL 100 MG PO TABS
100.0000 mg | ORAL_TABLET | Freq: Every evening | ORAL | 1 refills | Status: DC
  Filled 2023-06-11 – 2023-06-18 (×3): qty 90, 90d supply, fill #0

## 2023-06-11 MED ORDER — MYRBETRIQ 50 MG PO TB24
50.0000 mg | ORAL_TABLET | Freq: Every day | ORAL | 2 refills | Status: DC
  Filled 2023-06-11: qty 90, 90d supply, fill #0

## 2023-06-12 ENCOUNTER — Other Ambulatory Visit (HOSPITAL_COMMUNITY): Payer: Self-pay

## 2023-06-12 ENCOUNTER — Encounter (HOSPITAL_COMMUNITY): Payer: Self-pay | Admitting: Pharmacist

## 2023-06-18 ENCOUNTER — Other Ambulatory Visit (HOSPITAL_COMMUNITY): Payer: Self-pay

## 2023-06-18 ENCOUNTER — Ambulatory Visit: Payer: HMO

## 2023-06-19 ENCOUNTER — Other Ambulatory Visit (HOSPITAL_COMMUNITY): Payer: Self-pay

## 2023-06-19 MED FILL — Diltiazem HCl Coated Beads Cap ER 24HR 180 MG: ORAL | 90 days supply | Qty: 90 | Fill #0 | Status: CN

## 2023-06-20 ENCOUNTER — Other Ambulatory Visit (HOSPITAL_COMMUNITY): Payer: Self-pay

## 2023-06-20 ENCOUNTER — Encounter: Payer: Self-pay | Admitting: Internal Medicine

## 2023-06-20 ENCOUNTER — Ambulatory Visit: Payer: HMO

## 2023-06-20 DIAGNOSIS — Z961 Presence of intraocular lens: Secondary | ICD-10-CM | POA: Diagnosis not present

## 2023-06-20 DIAGNOSIS — H18593 Other hereditary corneal dystrophies, bilateral: Secondary | ICD-10-CM | POA: Diagnosis not present

## 2023-06-20 DIAGNOSIS — H524 Presbyopia: Secondary | ICD-10-CM | POA: Diagnosis not present

## 2023-06-20 DIAGNOSIS — H52223 Regular astigmatism, bilateral: Secondary | ICD-10-CM | POA: Diagnosis not present

## 2023-06-20 DIAGNOSIS — H5203 Hypermetropia, bilateral: Secondary | ICD-10-CM | POA: Diagnosis not present

## 2023-06-20 DIAGNOSIS — H353132 Nonexudative age-related macular degeneration, bilateral, intermediate dry stage: Secondary | ICD-10-CM | POA: Diagnosis not present

## 2023-06-20 MED FILL — Losartan Potassium Tab 100 MG: ORAL | 90 days supply | Qty: 90 | Fill #0 | Status: AC

## 2023-06-20 MED FILL — Diltiazem HCl Coated Beads Cap ER 24HR 180 MG: ORAL | 90 days supply | Qty: 90 | Fill #0 | Status: AC

## 2023-06-20 MED FILL — Furosemide Tab 20 MG: ORAL | 68 days supply | Qty: 135 | Fill #0 | Status: AC

## 2023-06-22 ENCOUNTER — Other Ambulatory Visit (HOSPITAL_COMMUNITY): Payer: Self-pay

## 2023-06-22 ENCOUNTER — Other Ambulatory Visit: Payer: Self-pay | Admitting: Internal Medicine

## 2023-06-22 DIAGNOSIS — K219 Gastro-esophageal reflux disease without esophagitis: Secondary | ICD-10-CM

## 2023-06-22 MED ORDER — FAMOTIDINE 40 MG PO TABS
40.0000 mg | ORAL_TABLET | Freq: Every day | ORAL | 2 refills | Status: DC
  Filled 2023-06-22: qty 90, 90d supply, fill #0

## 2023-06-24 ENCOUNTER — Emergency Department (HOSPITAL_BASED_OUTPATIENT_CLINIC_OR_DEPARTMENT_OTHER)
Admission: EM | Admit: 2023-06-24 | Discharge: 2023-06-25 | Disposition: A | Discharge status: Home / Self Care | Attending: Emergency Medicine | Admitting: Emergency Medicine

## 2023-06-24 ENCOUNTER — Emergency Department (HOSPITAL_BASED_OUTPATIENT_CLINIC_OR_DEPARTMENT_OTHER): Admitting: Radiology

## 2023-06-24 ENCOUNTER — Other Ambulatory Visit: Payer: Self-pay

## 2023-06-24 DIAGNOSIS — M542 Cervicalgia: Secondary | ICD-10-CM | POA: Insufficient documentation

## 2023-06-24 DIAGNOSIS — N281 Cyst of kidney, acquired: Secondary | ICD-10-CM | POA: Diagnosis not present

## 2023-06-24 DIAGNOSIS — S92405A Nondisplaced unspecified fracture of left great toe, initial encounter for closed fracture: Secondary | ICD-10-CM | POA: Diagnosis not present

## 2023-06-24 DIAGNOSIS — I2510 Atherosclerotic heart disease of native coronary artery without angina pectoris: Secondary | ICD-10-CM | POA: Diagnosis not present

## 2023-06-24 DIAGNOSIS — I110 Hypertensive heart disease with heart failure: Secondary | ICD-10-CM | POA: Insufficient documentation

## 2023-06-24 DIAGNOSIS — Y92410 Unspecified street and highway as the place of occurrence of the external cause: Secondary | ICD-10-CM | POA: Diagnosis not present

## 2023-06-24 DIAGNOSIS — T148XXA Other injury of unspecified body region, initial encounter: Secondary | ICD-10-CM

## 2023-06-24 DIAGNOSIS — S0990XA Unspecified injury of head, initial encounter: Secondary | ICD-10-CM | POA: Diagnosis not present

## 2023-06-24 DIAGNOSIS — I509 Heart failure, unspecified: Secondary | ICD-10-CM | POA: Insufficient documentation

## 2023-06-24 DIAGNOSIS — M25511 Pain in right shoulder: Secondary | ICD-10-CM | POA: Insufficient documentation

## 2023-06-24 DIAGNOSIS — Z7902 Long term (current) use of antithrombotics/antiplatelets: Secondary | ICD-10-CM | POA: Insufficient documentation

## 2023-06-24 DIAGNOSIS — I700 Atherosclerosis of aorta: Secondary | ICD-10-CM | POA: Insufficient documentation

## 2023-06-24 DIAGNOSIS — I10 Essential (primary) hypertension: Secondary | ICD-10-CM | POA: Diagnosis not present

## 2023-06-24 DIAGNOSIS — S99922A Unspecified injury of left foot, initial encounter: Secondary | ICD-10-CM | POA: Diagnosis present

## 2023-06-24 DIAGNOSIS — Z79899 Other long term (current) drug therapy: Secondary | ICD-10-CM | POA: Insufficient documentation

## 2023-06-24 DIAGNOSIS — S92415A Nondisplaced fracture of proximal phalanx of left great toe, initial encounter for closed fracture: Secondary | ICD-10-CM | POA: Diagnosis not present

## 2023-06-24 LAB — URINALYSIS, ROUTINE W REFLEX MICROSCOPIC
Bilirubin Urine: NEGATIVE
Glucose, UA: NEGATIVE mg/dL
Hgb urine dipstick: NEGATIVE
Ketones, ur: NEGATIVE mg/dL
Leukocytes,Ua: NEGATIVE
Nitrite: NEGATIVE
Protein, ur: NEGATIVE mg/dL
Specific Gravity, Urine: 1.014 (ref 1.005–1.030)
pH: 5.5 (ref 5.0–8.0)

## 2023-06-24 LAB — CBC
HCT: 32.4 % — ABNORMAL LOW (ref 36.0–46.0)
Hemoglobin: 10.8 g/dL — ABNORMAL LOW (ref 12.0–15.0)
MCH: 33.3 pg (ref 26.0–34.0)
MCHC: 33.3 g/dL (ref 30.0–36.0)
MCV: 100 fL (ref 80.0–100.0)
Platelets: 280 10*3/uL (ref 150–400)
RBC: 3.24 MIL/uL — ABNORMAL LOW (ref 3.87–5.11)
RDW: 12.8 % (ref 11.5–15.5)
WBC: 9.1 10*3/uL (ref 4.0–10.5)
nRBC: 0 % (ref 0.0–0.2)

## 2023-06-24 LAB — PROTIME-INR
INR: 0.9 (ref 0.8–1.2)
Prothrombin Time: 12.3 s (ref 11.4–15.2)

## 2023-06-24 MED ORDER — FAMOTIDINE 40 MG PO TABS
40.0000 mg | ORAL_TABLET | Freq: Every day | ORAL | 3 refills | Status: DC
Start: 2023-06-24 — End: 2023-08-28
  Filled 2023-06-24: qty 90, 90d supply, fill #0

## 2023-06-24 MED ORDER — ACETAMINOPHEN 500 MG PO TABS
1000.0000 mg | ORAL_TABLET | Freq: Once | ORAL | Status: AC
  Administered 2023-06-24: 1000 mg via ORAL
  Filled 2023-06-24: qty 2
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
## 2023-06-25 ENCOUNTER — Emergency Department (HOSPITAL_BASED_OUTPATIENT_CLINIC_OR_DEPARTMENT_OTHER)

## 2023-06-25 ENCOUNTER — Other Ambulatory Visit (HOSPITAL_COMMUNITY): Payer: Self-pay

## 2023-06-25 DIAGNOSIS — Z041 Encounter for examination and observation following transport accident: Secondary | ICD-10-CM | POA: Diagnosis not present

## 2023-06-25 DIAGNOSIS — Z981 Arthrodesis status: Secondary | ICD-10-CM | POA: Diagnosis not present

## 2023-06-25 DIAGNOSIS — S3993XA Unspecified injury of pelvis, initial encounter: Secondary | ICD-10-CM | POA: Diagnosis not present

## 2023-06-25 DIAGNOSIS — S3991XA Unspecified injury of abdomen, initial encounter: Secondary | ICD-10-CM | POA: Diagnosis not present

## 2023-06-25 DIAGNOSIS — S199XXA Unspecified injury of neck, initial encounter: Secondary | ICD-10-CM | POA: Diagnosis not present

## 2023-06-25 DIAGNOSIS — S0990XA Unspecified injury of head, initial encounter: Secondary | ICD-10-CM | POA: Diagnosis not present

## 2023-06-25 DIAGNOSIS — S299XXA Unspecified injury of thorax, initial encounter: Secondary | ICD-10-CM | POA: Diagnosis not present

## 2023-06-25 LAB — COMPREHENSIVE METABOLIC PANEL
ALT: 13 U/L (ref 0–44)
AST: 18 U/L (ref 15–41)
Albumin: 4.4 g/dL (ref 3.5–5.0)
Alkaline Phosphatase: 56 U/L (ref 38–126)
Anion gap: 7 (ref 5–15)
BUN: 20 mg/dL (ref 8–23)
CO2: 30 mmol/L (ref 22–32)
Calcium: 9.6 mg/dL (ref 8.9–10.3)
Chloride: 102 mmol/L (ref 98–111)
Creatinine, Ser: 1.1 mg/dL — ABNORMAL HIGH (ref 0.44–1.00)
GFR, Estimated: 51 mL/min — ABNORMAL LOW (ref >60)
Glucose, Bld: 108 mg/dL — ABNORMAL HIGH (ref 70–99)
Potassium: 4 mmol/L (ref 3.5–5.1)
Sodium: 139 mmol/L (ref 135–145)
Total Bilirubin: 0.2 mg/dL (ref 0.0–1.2)
Total Protein: 6.4 g/dL — ABNORMAL LOW (ref 6.5–8.1)

## 2023-06-25 LAB — TROPONIN I (HIGH SENSITIVITY): Troponin I (High Sensitivity): 6 ng/L (ref <18)

## 2023-06-25 LAB — LACTIC ACID, PLASMA: Lactic Acid, Venous: 0.8 mmol/L (ref 0.5–1.9)

## 2023-06-25 LAB — ETHANOL: Alcohol, Ethyl (B): <10 mg/dL (ref <10)

## 2023-06-25 MED ORDER — IOHEXOL 300 MG/ML  SOLN
100.0000 mL | Freq: Once | INTRAMUSCULAR | Status: AC | PRN
  Administered 2023-06-25: 100 mL via INTRAVENOUS
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
## 2023-06-25 NOTE — ED Notes (Signed)
 Patient back from CT.

## 2023-06-25 NOTE — ED Notes (Signed)
 Patient to ct

## 2023-06-25 NOTE — ED Notes (Signed)
 Pt refused post op shoe and crutches. Pt stated she doesn't need either one.

## 2023-06-26 ENCOUNTER — Ambulatory Visit: Payer: HMO

## 2023-06-26 ENCOUNTER — Encounter: Payer: Self-pay | Admitting: Internal Medicine

## 2023-06-26 ENCOUNTER — Other Ambulatory Visit (HOSPITAL_COMMUNITY): Payer: Self-pay

## 2023-06-26 DIAGNOSIS — R911 Solitary pulmonary nodule: Secondary | ICD-10-CM

## 2023-06-28 ENCOUNTER — Encounter (HOSPITAL_BASED_OUTPATIENT_CLINIC_OR_DEPARTMENT_OTHER): Payer: HMO

## 2023-06-28 DIAGNOSIS — R911 Solitary pulmonary nodule: Secondary | ICD-10-CM | POA: Insufficient documentation

## 2023-07-03 ENCOUNTER — Other Ambulatory Visit (HOSPITAL_BASED_OUTPATIENT_CLINIC_OR_DEPARTMENT_OTHER): Payer: Self-pay

## 2023-07-03 MED FILL — Methocarbamol Tab 500 MG: ORAL | 60 days supply | Qty: 60 | Fill #0 | Status: CN

## 2023-07-04 ENCOUNTER — Other Ambulatory Visit (HOSPITAL_BASED_OUTPATIENT_CLINIC_OR_DEPARTMENT_OTHER): Payer: Self-pay

## 2023-07-04 ENCOUNTER — Ambulatory Visit (HOSPITAL_BASED_OUTPATIENT_CLINIC_OR_DEPARTMENT_OTHER): Payer: HMO

## 2023-07-04 DIAGNOSIS — I771 Stricture of artery: Secondary | ICD-10-CM | POA: Diagnosis not present

## 2023-07-04 MED ORDER — MIRABEGRON ER 50 MG PO TB24
50.0000 mg | ORAL_TABLET | Freq: Every day | ORAL | 2 refills | Status: DC
  Filled 2023-09-28: qty 90, 90d supply, fill #0
  Filled 2023-12-24 – 2024-02-22 (×2): qty 90, 90d supply, fill #1

## 2023-07-04 MED ORDER — TROSPIUM CHLORIDE ER 60 MG PO CP24: 60.0000 mg | ORAL_CAPSULE | Freq: Every day | ORAL | 11 refills | Status: DC

## 2023-07-04 MED ORDER — FAMOTIDINE 40 MG PO TABS
40.0000 mg | ORAL_TABLET | Freq: Every day | ORAL | 2 refills | Status: DC
  Filled 2023-09-28: qty 90, 90d supply, fill #0

## 2023-07-04 MED ORDER — DICLOFENAC SODIUM 50 MG PO TBEC: 50.0000 mg | DELAYED_RELEASE_TABLET | Freq: Every day | ORAL | 1 refills | Status: DC | PRN

## 2023-07-04 MED ORDER — MIRABEGRON ER 50 MG PO TB24: 50.0000 mg | ORAL_TABLET | Freq: Every day | ORAL | 2 refills | Status: DC

## 2023-07-04 MED ORDER — GEMTESA 75 MG PO TABS: 75.0000 mg | ORAL_TABLET | Freq: Every day | ORAL | 5 refills | Status: DC

## 2023-07-04 MED ORDER — DICLOFENAC SODIUM 50 MG PO TBEC: 50.0000 mg | DELAYED_RELEASE_TABLET | Freq: Every day | ORAL | 0 refills | Status: DC | PRN

## 2023-07-04 MED ORDER — ESTRADIOL 0.1 MG/GM VA CREA: 1.0000 | TOPICAL_CREAM | VAGINAL | 4 refills | Status: AC

## 2023-07-05 ENCOUNTER — Other Ambulatory Visit: Payer: Self-pay

## 2023-07-05 ENCOUNTER — Other Ambulatory Visit (HOSPITAL_COMMUNITY): Payer: Self-pay

## 2023-07-05 MED FILL — Methocarbamol Tab 500 MG: ORAL | 60 days supply | Qty: 60 | Fill #0 | Status: AC

## 2023-07-08 ENCOUNTER — Other Ambulatory Visit: Payer: Self-pay | Admitting: Internal Medicine

## 2023-07-08 DIAGNOSIS — I5032 Chronic diastolic (congestive) heart failure: Secondary | ICD-10-CM

## 2023-07-08 DIAGNOSIS — G4709 Other insomnia: Secondary | ICD-10-CM

## 2023-07-08 DIAGNOSIS — E785 Hyperlipidemia, unspecified: Secondary | ICD-10-CM

## 2023-07-12 ENCOUNTER — Telehealth (HOSPITAL_COMMUNITY): Payer: Self-pay

## 2023-07-12 NOTE — Telephone Encounter (Signed)
 Pt agreed to f/u in 6 months with a US carotid. She prefers to have them done at [facility]. AB

## 2023-07-25 ENCOUNTER — Ambulatory Visit
Admission: RE | Admit: 2023-07-25 | Discharge: 2023-07-25 | Disposition: A | Discharge status: Home / Self Care | Source: Ambulatory Visit | Attending: Internal Medicine

## 2023-07-25 DIAGNOSIS — Z1231 Encounter for screening mammogram for malignant neoplasm of breast: Secondary | ICD-10-CM

## 2023-08-28 ENCOUNTER — Encounter: Payer: Self-pay | Admitting: Cardiology

## 2023-08-28 ENCOUNTER — Ambulatory Visit: Attending: Cardiology | Admitting: Cardiology

## 2023-08-28 ENCOUNTER — Other Ambulatory Visit (HOSPITAL_BASED_OUTPATIENT_CLINIC_OR_DEPARTMENT_OTHER): Payer: Self-pay

## 2023-08-28 ENCOUNTER — Ambulatory Visit

## 2023-08-28 VITALS — BP 130/62 | HR 71 | Ht 63.0 in | Wt 147.4 lb

## 2023-08-28 DIAGNOSIS — I5032 Chronic diastolic (congestive) heart failure: Secondary | ICD-10-CM | POA: Diagnosis not present

## 2023-08-28 DIAGNOSIS — I6523 Occlusion and stenosis of bilateral carotid arteries: Secondary | ICD-10-CM | POA: Diagnosis not present

## 2023-08-28 DIAGNOSIS — I480 Paroxysmal atrial fibrillation: Secondary | ICD-10-CM | POA: Insufficient documentation

## 2023-08-28 DIAGNOSIS — E785 Hyperlipidemia, unspecified: Secondary | ICD-10-CM

## 2023-08-28 DIAGNOSIS — I2510 Atherosclerotic heart disease of native coronary artery without angina pectoris: Secondary | ICD-10-CM | POA: Diagnosis not present

## 2023-08-28 DIAGNOSIS — I10 Essential (primary) hypertension: Secondary | ICD-10-CM

## 2023-08-28 DIAGNOSIS — I771 Stricture of artery: Secondary | ICD-10-CM | POA: Diagnosis not present

## 2023-08-28 DIAGNOSIS — I471 Supraventricular tachycardia, unspecified: Secondary | ICD-10-CM

## 2023-08-28 MED ORDER — APIXABAN 5 MG PO TABS
5.0000 mg | ORAL_TABLET | Freq: Two times a day (BID) | ORAL | 3 refills | Status: DC
  Filled 2023-08-28: qty 180, 90d supply, fill #0

## 2023-08-28 NOTE — Patient Instructions (Signed)
 Medication Instructions:  Please START Eliquis 5 mg twice a day, once with breakfast and once with dinner.   *If you need a refill on your cardiac medications before your next appointment, please call your pharmacy*  Lab Work: None.  If you have labs (blood work) drawn today and your tests are completely normal, you will receive your results only by: MyChart Message (if you have MyChart) OR A paper copy in the mail If you have any lab test that is abnormal or we need to change your treatment, we will call you to review the results.  Testing/Procedures: Your physician has requested that you have an echocardiogram. Echocardiography is a painless test that uses sound waves to create images of your heart. It provides your doctor with information about the size and shape of your heart and how well your heart's chambers and valves are working. This procedure takes approximately one hour. There are no restrictions for this procedure. Please do NOT wear cologne, perfume, aftershave, or lotions (deodorant is allowed). Please arrive 15 minutes prior to your appointment time.  Please note: We ask at that you not bring children with you during ultrasound (echo/ vascular) testing. Due to room size and safety concerns, children are not allowed in the ultrasound rooms during exams. Our front office staff cannot provide observation of children in our lobby area while testing is being conducted. An adult accompanying a patient to their appointment will only be allowed in the ultrasound room at the discretion of the ultrasound technician under special circumstances. We apologize for any inconvenience.  ZIO XT- Long Term Monitor Instructions  Your physician has requested you wear a ZIO patch monitor for 14 days.  This is a single patch monitor. [employer] supplies one patch monitor per enrollment. Additional stickers are not available. Please do not apply patch if you will be having a Nuclear Stress Test,   Echocardiogram, Cardiac CT, MRI, or Chest Xray during the period you would be wearing the  monitor. The patch cannot be worn during these tests. You cannot remove and re-apply the  ZIO XT patch monitor.  Your ZIO patch monitor will be mailed 3 day USPS to your address on file. It may take 3-5 days  to receive your monitor after you have been enrolled.  Once you have received your monitor, please review the enclosed instructions. Your monitor  has already been registered assigning a specific monitor serial # to you.  Billing and Patient Assistance Program Information  We have supplied [employer] with any of your insurance information on file for billing purposes. [employer] offers a sliding scale Patient Assistance Program for patients that do not have  insurance, or whose insurance does not completely cover the cost of the ZIO monitor.  You must apply for the Patient Assistance Program to qualify for this discounted rate.  To apply, please call [employer] at [phone], select option 4, select option 2, ask to apply for  Patient Assistance Program. Sanna Crystal will ask your household income, and how many people  are in your household. They will quote your out-of-pocket cost based on that information.  [employer] will also be able to set up a 80-month, interest-free payment plan if needed.  Applying the monitor   Shave hair from upper left chest.  Hold abrader disc by orange tab. Rub abrader in 40 strokes over the upper left chest as  indicated in your monitor instructions.  Clean area with 4 enclosed alcohol pads. Let dry.  Apply patch as indicated in  monitor instructions. Patch will be placed under collarbone on left  side of chest with arrow pointing upward.  Rub patch adhesive wings for 2 minutes. Remove white label marked "1". Remove the white  label marked "2". Rub patch adhesive wings for 2 additional minutes.  While looking in a mirror, press and release button in center of patch. A small  green light will  flash 3-4 times. This will be your only indicator that the monitor has been turned on.  Do not shower for the first 24 hours. You may shower after the first 24 hours.  Press the button if you feel a symptom. You will hear a small click. Record Date, Time and  Symptom in the Patient Logbook.  When you are ready to remove the patch, follow instructions on the last 2 pages of Patient  Logbook. Stick patch monitor onto the last page of Patient Logbook.  Place Patient Logbook in the blue and white box. Use locking tab on box and tape box closed  securely. The blue and white box has prepaid postage on it. Please place it in the mailbox as  soon as possible. Your physician should have your test results approximately 7 days after the  monitor has been mailed back to [facility].  Call [facility] Customer Care at [phone] if you have questions regarding  your ZIO XT patch monitor. Call them immediately if you see an orange light blinking on your  monitor.  If your monitor falls off in less than 4 days, contact our Monitor department at [phone].  If your monitor becomes loose or falls off after 4 days call [employer] at [phone] for  suggestions on securing your monitor   Follow-Up: At [facility], you and your health needs are our priority.  As part of our continuing mission to provide you with exceptional heart care, our providers are all part of one team.  This team includes your primary Cardiologist (physician) and Advanced Practice Providers or APPs (Physician Assistants and Nurse Practitioners) who all work together to provide you with the care you need, when you need it.  Your next appointment:   6 month(s)  Provider:   Gaylyn Keas, MD

## 2023-08-28 NOTE — Addendum Note (Signed)
 Addended by: Cherylyn Cos on: 08/28/2023 01:34 PM   Modules accepted: Orders

## 2023-08-28 NOTE — Progress Notes (Unsigned)
 Enrolled patient for a 14 day Zio XT  monitor to be mailed to patients home
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
## 2023-09-05 ENCOUNTER — Encounter: Payer: Self-pay | Admitting: Cardiology

## 2023-09-06 ENCOUNTER — Encounter: Payer: Self-pay | Admitting: Cardiology

## 2023-09-06 ENCOUNTER — Encounter: Payer: Self-pay | Admitting: Internal Medicine

## 2023-09-06 DIAGNOSIS — Z4789 Encounter for other orthopedic aftercare: Secondary | ICD-10-CM | POA: Diagnosis not present

## 2023-09-06 DIAGNOSIS — M65341 Trigger finger, right ring finger: Secondary | ICD-10-CM | POA: Diagnosis not present

## 2023-09-06 MED ORDER — ALPRAZOLAM 0.5 MG PO TABS
0.2500 mg | ORAL_TABLET | Freq: Every evening | ORAL | 5 refills | Status: DC | PRN
Start: 2023-09-06 — End: 2024-03-10
  Filled 2023-09-06: qty 30, 30d supply, fill #0
  Filled 2023-10-17: qty 30, 30d supply, fill #1
  Filled 2023-11-20: qty 30, 30d supply, fill #2
  Filled 2023-12-24: qty 30, 30d supply, fill #3
  Filled 2024-02-04: qty 30, 30d supply, fill #4

## 2023-09-07 ENCOUNTER — Other Ambulatory Visit (HOSPITAL_BASED_OUTPATIENT_CLINIC_OR_DEPARTMENT_OTHER): Payer: Self-pay

## 2023-09-07 ENCOUNTER — Telehealth: Payer: Self-pay

## 2023-09-07 DIAGNOSIS — R0600 Dyspnea, unspecified: Secondary | ICD-10-CM

## 2023-09-07 NOTE — Telephone Encounter (Signed)
 Informed Consent   Shared Decision Making/Informed Consent The risks [chest pain, shortness of breath, cardiac arrhythmias, dizziness, blood pressure fluctuations, myocardial infarction, stroke/transient ischemic attack, nausea, vomiting, allergic reaction, radiation exposure, metallic taste sensation and life-threatening complications (estimated to be 1 in 10,000)], benefits (risk stratification, diagnosing coronary artery disease, treatment guidance) and alternatives of a nuclear stress test were discussed in detail with Susan Gillespie and she agrees to proceed.

## 2023-09-13 ENCOUNTER — Encounter (HOSPITAL_COMMUNITY): Payer: Self-pay | Admitting: Cardiology

## 2023-09-14 ENCOUNTER — Encounter: Payer: Self-pay | Admitting: Cardiology

## 2023-09-15 ENCOUNTER — Ambulatory Visit
Admission: EM | Admit: 2023-09-15 | Discharge: 2023-09-15 | Disposition: A | Discharge status: Other Acute Inpatient Hospital | Attending: Family Medicine | Admitting: Family Medicine

## 2023-09-15 ENCOUNTER — Other Ambulatory Visit: Payer: Self-pay

## 2023-09-15 ENCOUNTER — Encounter (HOSPITAL_BASED_OUTPATIENT_CLINIC_OR_DEPARTMENT_OTHER): Payer: Self-pay | Admitting: Emergency Medicine

## 2023-09-15 ENCOUNTER — Inpatient Hospital Stay (HOSPITAL_BASED_OUTPATIENT_CLINIC_OR_DEPARTMENT_OTHER)
Admission: EM | Admit: 2023-09-15 | Discharge: 2023-09-20 | DRG: 813 | Disposition: A | Discharge status: Home / Self Care | Attending: Internal Medicine | Admitting: Internal Medicine

## 2023-09-15 ENCOUNTER — Emergency Department (HOSPITAL_BASED_OUTPATIENT_CLINIC_OR_DEPARTMENT_OTHER)

## 2023-09-15 DIAGNOSIS — K31819 Angiodysplasia of stomach and duodenum without bleeding: Secondary | ICD-10-CM | POA: Diagnosis not present

## 2023-09-15 DIAGNOSIS — E785 Hyperlipidemia, unspecified: Secondary | ICD-10-CM | POA: Diagnosis present

## 2023-09-15 DIAGNOSIS — K621 Rectal polyp: Secondary | ICD-10-CM | POA: Diagnosis not present

## 2023-09-15 DIAGNOSIS — K921 Melena: Secondary | ICD-10-CM | POA: Diagnosis not present

## 2023-09-15 DIAGNOSIS — Z7901 Long term (current) use of anticoagulants: Secondary | ICD-10-CM | POA: Diagnosis not present

## 2023-09-15 DIAGNOSIS — K922 Gastrointestinal hemorrhage, unspecified: Secondary | ICD-10-CM | POA: Diagnosis not present

## 2023-09-15 DIAGNOSIS — Z7982 Long term (current) use of aspirin: Secondary | ICD-10-CM | POA: Diagnosis not present

## 2023-09-15 DIAGNOSIS — K5790 Diverticulosis of intestine, part unspecified, without perforation or abscess without bleeding: Secondary | ICD-10-CM | POA: Diagnosis not present

## 2023-09-15 DIAGNOSIS — D62 Acute posthemorrhagic anemia: Principal | ICD-10-CM | POA: Diagnosis present

## 2023-09-15 DIAGNOSIS — D124 Benign neoplasm of descending colon: Secondary | ICD-10-CM | POA: Diagnosis not present

## 2023-09-15 DIAGNOSIS — D539 Nutritional anemia, unspecified: Secondary | ICD-10-CM | POA: Diagnosis not present

## 2023-09-15 DIAGNOSIS — I110 Hypertensive heart disease with heart failure: Secondary | ICD-10-CM | POA: Diagnosis not present

## 2023-09-15 DIAGNOSIS — E876 Hypokalemia: Secondary | ICD-10-CM | POA: Diagnosis not present

## 2023-09-15 DIAGNOSIS — K219 Gastro-esophageal reflux disease without esophagitis: Secondary | ICD-10-CM | POA: Diagnosis not present

## 2023-09-15 DIAGNOSIS — R5383 Other fatigue: Secondary | ICD-10-CM | POA: Diagnosis not present

## 2023-09-15 DIAGNOSIS — Z95820 Peripheral vascular angioplasty status with implants and grafts: Secondary | ICD-10-CM | POA: Diagnosis not present

## 2023-09-15 DIAGNOSIS — K5521 Angiodysplasia of colon with hemorrhage: Secondary | ICD-10-CM | POA: Diagnosis present

## 2023-09-15 DIAGNOSIS — Z87891 Personal history of nicotine dependence: Secondary | ICD-10-CM

## 2023-09-15 DIAGNOSIS — R0609 Other forms of dyspnea: Secondary | ICD-10-CM

## 2023-09-15 DIAGNOSIS — R0602 Shortness of breath: Secondary | ICD-10-CM | POA: Diagnosis not present

## 2023-09-15 DIAGNOSIS — K5731 Diverticulosis of large intestine without perforation or abscess with bleeding: Secondary | ICD-10-CM | POA: Diagnosis not present

## 2023-09-15 DIAGNOSIS — I480 Paroxysmal atrial fibrillation: Secondary | ICD-10-CM | POA: Diagnosis not present

## 2023-09-15 DIAGNOSIS — K635 Polyp of colon: Secondary | ICD-10-CM | POA: Diagnosis present

## 2023-09-15 DIAGNOSIS — D128 Benign neoplasm of rectum: Secondary | ICD-10-CM | POA: Diagnosis not present

## 2023-09-15 DIAGNOSIS — K3189 Other diseases of stomach and duodenum: Secondary | ICD-10-CM | POA: Diagnosis not present

## 2023-09-15 DIAGNOSIS — T45515A Adverse effect of anticoagulants, initial encounter: Secondary | ICD-10-CM | POA: Diagnosis present

## 2023-09-15 DIAGNOSIS — T45525A Adverse effect of antithrombotic drugs, initial encounter: Secondary | ICD-10-CM | POA: Diagnosis not present

## 2023-09-15 DIAGNOSIS — D6832 Hemorrhagic disorder due to extrinsic circulating anticoagulants: Principal | ICD-10-CM | POA: Diagnosis present

## 2023-09-15 DIAGNOSIS — G4700 Insomnia, unspecified: Secondary | ICD-10-CM | POA: Diagnosis not present

## 2023-09-15 DIAGNOSIS — K2971 Gastritis, unspecified, with bleeding: Secondary | ICD-10-CM | POA: Diagnosis not present

## 2023-09-15 DIAGNOSIS — I2510 Atherosclerotic heart disease of native coronary artery without angina pectoris: Secondary | ICD-10-CM | POA: Diagnosis not present

## 2023-09-15 DIAGNOSIS — I5032 Chronic diastolic (congestive) heart failure: Secondary | ICD-10-CM | POA: Diagnosis present

## 2023-09-15 DIAGNOSIS — K648 Other hemorrhoids: Secondary | ICD-10-CM | POA: Diagnosis present

## 2023-09-15 DIAGNOSIS — D123 Benign neoplasm of transverse colon: Secondary | ICD-10-CM | POA: Diagnosis not present

## 2023-09-15 DIAGNOSIS — Z7902 Long term (current) use of antithrombotics/antiplatelets: Secondary | ICD-10-CM

## 2023-09-15 DIAGNOSIS — D649 Anemia, unspecified: Secondary | ICD-10-CM | POA: Diagnosis not present

## 2023-09-15 DIAGNOSIS — K5730 Diverticulosis of large intestine without perforation or abscess without bleeding: Secondary | ICD-10-CM | POA: Diagnosis not present

## 2023-09-15 DIAGNOSIS — K259 Gastric ulcer, unspecified as acute or chronic, without hemorrhage or perforation: Secondary | ICD-10-CM | POA: Diagnosis not present

## 2023-09-15 DIAGNOSIS — K2970 Gastritis, unspecified, without bleeding: Secondary | ICD-10-CM | POA: Diagnosis not present

## 2023-09-15 LAB — CBC
HCT: 21.1 % — ABNORMAL LOW (ref 36.0–46.0)
Hemoglobin: 6.9 g/dL — CL (ref 12.0–15.0)
MCH: 33.8 pg (ref 26.0–34.0)
MCHC: 32.7 g/dL (ref 30.0–36.0)
MCV: 103.4 fL — ABNORMAL HIGH (ref 80.0–100.0)
Platelets: 274 10*3/uL (ref 150–400)
RBC: 2.04 MIL/uL — ABNORMAL LOW (ref 3.87–5.11)
RDW: 13.9 % (ref 11.5–15.5)
WBC: 8.5 10*3/uL (ref 4.0–10.5)
nRBC: 0 % (ref 0.0–0.2)

## 2023-09-15 LAB — BASIC METABOLIC PANEL WITH GFR
Anion gap: 12 (ref 5–15)
BUN: 40 mg/dL — ABNORMAL HIGH (ref 8–23)
CO2: 24 mmol/L (ref 22–32)
Calcium: 9.8 mg/dL (ref 8.9–10.3)
Chloride: 103 mmol/L (ref 98–111)
Creatinine, Ser: 1.47 mg/dL — ABNORMAL HIGH (ref 0.44–1.00)
GFR, Estimated: 36 mL/min — ABNORMAL LOW (ref >60)
Glucose, Bld: 118 mg/dL — ABNORMAL HIGH (ref 70–99)
Potassium: 4.1 mmol/L (ref 3.5–5.1)
Sodium: 139 mmol/L (ref 135–145)

## 2023-09-15 LAB — PREPARE RBC (CROSSMATCH)

## 2023-09-15 LAB — OCCULT BLOOD X 1 CARD TO LAB, STOOL: Fecal Occult Bld: POSITIVE — AB

## 2023-09-15 LAB — TROPONIN T, HIGH SENSITIVITY: Troponin T High Sensitivity: 17 ng/L (ref <19)

## 2023-09-15 LAB — PRO BRAIN NATRIURETIC PEPTIDE: Pro Brain Natriuretic Peptide: 1258 pg/mL — ABNORMAL HIGH (ref <300.0)

## 2023-09-15 LAB — ABO/RH: ABO/RH(D): A NEG

## 2023-09-15 MED ORDER — ROSUVASTATIN CALCIUM 5 MG PO TABS
10.0000 mg | ORAL_TABLET | Freq: Every day | ORAL | Status: DC
  Administered 2023-09-15 – 2023-09-20 (×4): 10 mg via ORAL
  Filled 2023-09-15 (×5): qty 2

## 2023-09-15 MED ORDER — FUROSEMIDE 20 MG PO TABS
20.0000 mg | ORAL_TABLET | Freq: Every day | ORAL | Status: DC
  Administered 2023-09-15: 20 mg via ORAL
  Filled 2023-09-15: qty 1

## 2023-09-15 MED ORDER — ALPRAZOLAM 0.25 MG PO TABS
0.2500 mg | ORAL_TABLET | Freq: Every evening | ORAL | Status: DC | PRN
  Administered 2023-09-15 – 2023-09-19 (×5): 0.25 mg via ORAL
  Filled 2023-09-15 (×5): qty 1

## 2023-09-15 MED ORDER — PANTOPRAZOLE SODIUM 40 MG IV SOLR
40.0000 mg | Freq: Two times a day (BID) | INTRAVENOUS | Status: DC
  Administered 2023-09-15 – 2023-09-16 (×2): 40 mg via INTRAVENOUS
  Filled 2023-09-15 (×2): qty 10

## 2023-09-15 MED ORDER — SODIUM CHLORIDE 0.9 % IV SOLN: INTRAVENOUS | Status: AC

## 2023-09-15 MED ORDER — LOSARTAN POTASSIUM 50 MG PO TABS
100.0000 mg | ORAL_TABLET | Freq: Every day | ORAL | Status: DC
  Administered 2023-09-15: 100 mg via ORAL
  Filled 2023-09-15: qty 2

## 2023-09-15 MED ORDER — PANTOPRAZOLE SODIUM 40 MG IV SOLR
80.0000 mg | Freq: Once | INTRAVENOUS | Status: AC
  Administered 2023-09-15: 80 mg via INTRAVENOUS
  Filled 2023-09-15: qty 20

## 2023-09-15 MED ORDER — DILTIAZEM HCL ER COATED BEADS 180 MG PO CP24
180.0000 mg | ORAL_CAPSULE | Freq: Every day | ORAL | Status: DC
  Administered 2023-09-15 – 2023-09-19 (×5): 180 mg via ORAL
  Filled 2023-09-15 (×7): qty 1

## 2023-09-15 MED ORDER — SODIUM CHLORIDE 0.9% IV SOLUTION: Freq: Once | INTRAVENOUS | Status: DC

## 2023-09-15 MED ORDER — TRAZODONE HCL 50 MG PO TABS
100.0000 mg | ORAL_TABLET | Freq: Every day | ORAL | Status: DC
  Administered 2023-09-15 – 2023-09-19 (×5): 100 mg via ORAL
  Filled 2023-09-15 (×5): qty 2

## 2023-09-15 MED ORDER — METOPROLOL SUCCINATE ER 50 MG PO TB24: 50.0000 mg | ORAL_TABLET | ORAL | Status: DC | PRN

## 2023-09-15 MED ORDER — FAMOTIDINE 20 MG PO TABS
40.0000 mg | ORAL_TABLET | Freq: Every day | ORAL | Status: DC
  Administered 2023-09-15 – 2023-09-20 (×4): 40 mg via ORAL
  Filled 2023-09-15 (×5): qty 2

## 2023-09-15 NOTE — ED Provider Notes (Signed)
 [facility]    CSN: [id number] Arrival date & time: 09/15/23  1039      History   Chief Complaint Chief Complaint  Patient presents with   Shortness of Breath    Sob and fatigue. X2 weeks    HPI Susan Gillespie is a 78 y.o. female presents with shortness of breath on exertion as well as fatigue.  Patient has a complex medical history including bilateral carotid stenosis, CVA, CAD, CHF, A-fib, SVT who presents for 2 weeks of worsening shortness of breath with exertion and extreme fatigue with dizziness.  She states she can barely do any activities without having to sit down and rest.  She denies any chest pain, orthopnea, lower extremity swelling, weight changes, syncope.  Does occasionally have palpitations.  No URI symptoms such as cough/congestion.  She does state that at night she can hear her pulse pounding in her head but she denies headache.  She was recently started on Eliquis  and denies any hematuria or bleeding from her gums.  She did contact her cardiologist and will be following up with them next week and also has a stress test ordered.  She is afraid she may be anemic now that she is on these blood thinning medications.   Shortness of Breath   Past Medical History:  Diagnosis Date   Anxiety    Artery stenosis (HCC)    left internal carotid, left subclavian   Arthritis    Bleeding tendency (HCC) 05/16/2012   Hx post op bleeding (epidural hematoma s/p ACDF '10; LLQ hematoma due to superficial vessel fascial bleed post colon resection; Normal platelet count; saw Dr. Isidor Marek '14   CAD in native artery    a. mod to mod-severe CAD by cath 10/2016, med rx.   Carotid stenosis, bilateral 08/31/2016   1-39% right and 50-69% lefft ICA stenosis dopplers 04/2019   Chronic diastolic CHF (congestive heart failure) (HCC)    Colon polyp    Diverticulitis    Family history of cardiovascular disease    Fracture of right fibula    GERD (gastroesophageal reflux disease)     Headache(784.0)    mirgraines - history of   Hearing aid worn    B/L   HOH (hard of hearing)    wears hearing aids   Hyperlipidemia    Hypertension    IBS (irritable bowel syndrome)    followed by Dr. Andriette Keeling   Liver cyst    Melanoma (HCC)    removed from back 04/2013   PAF (paroxysmal atrial fibrillation) (HCC)    Shortness of breath dyspnea    with exertion   Stroke (HCC) 06/2012   mild   SVT (supraventricular tachycardia) (HCC)    Urinary urgency    UTI (lower urinary tract infection)    x 2 since June 2016   Wears glasses     Patient Active Problem List   Diagnosis Date Noted   PAF (paroxysmal atrial fibrillation) (HCC)    Lung nodule, RUL 3 mm - CT 06/2023 06/28/2023   Urinary urgency 05/24/2022   Chronic back pain 05/24/2022   Urinary frequency 11/16/2021   Right foot pain 11/16/2021   Aortic atherosclerosis (HCC) 11/11/2020   Sacroiliac joint pain 02/14/2020   Abdominal aortic atherosclerosis (HCC) 12/10/2019   Lower back pain 11/11/2019   Osteoarthritis of carpometacarpal (CMC) joint of thumb 07/17/2019   Pain in right hand 10/02/2018   Bilateral carpal tunnel syndrome 10/02/2018   Arthritis of hand 10/02/2018  Subclavian steal syndrome 10/02/2018   Bladder spasms 10/09/2017   Migraine without status migrainosus, not intractable 10/09/2017   Spondylosis of cervical region without myelopathy or radiculopathy 10/09/2017   Insomnia 10/09/2017   IBS (irritable bowel syndrome) 10/09/2017   Chronic diastolic CHF (congestive heart failure), NYHA class 1 (HCC) 02/20/2017   CAD in native artery 11/10/2016   Carotid stenosis, bilateral 08/31/2016   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HCC) 01/05/2016   Pernicious anemia 09/15/2013   B12 deficiency anemia 09/15/2013   Situational anxiety 12/20/2012   Spinal stenosis of lumbar region 07/05/2012   Cerebral infarction (HCC) 06/28/2012   Bleeding tendency (HCC) 05/16/2012   Paroxysmal supraventricular tachycardia (HCC)  09/25/2011   Essential hypertension, benign 08/22/2010   Prediabetes 04/28/2009   Hyperlipidemia LDL goal <70 12/16/2008   GERD 12/16/2008   Osteoarthritis 12/16/2008    Past Surgical History:  Procedure Laterality Date   ABDOMINAL HYSTERECTOMY     ANGIOPLASTY     stent in subclavian LEFT 2016, angioplasty of stent 2017   APPENDECTOMY     AUGMENTATION MAMMAPLASTY Bilateral    replaced silicone with saline 2004   BREAST EXCISIONAL BIOPSY Left    benign 1977   BREAST SURGERY     BIL breast augmentation   CHOLECYSTECTOMY     COLON SURGERY     Sigmoid Colectomy, returned for post op bleeding   COLONOSCOPY W/ POLYPECTOMY     COLONOSCOPY WITH PROPOFOL  N/A 05/05/2016   Procedure: COLONOSCOPY WITH PROPOFOL ;  Surgeon: Juanita Norlander, MD;  Location: [facility];  Service: General;  Laterality: N/A;   ESOPHAGOGASTRODUODENOSCOPY (EGD) WITH PROPOFOL  N/A 05/05/2016   Procedure: ESOPHAGOGASTRODUODENOSCOPY (EGD) WITH PROPOFOL ;  Surgeon: Juanita Norlander, MD;  Location: [facility];  Service: General;  Laterality: N/A;   EYE SURGERY     eyelid drooping fixed   IR ANGIO INTRA EXTRACRAN SEL COM CAROTID INNOMINATE BILAT MOD SED  10/16/2018   IR ANGIO VERTEBRAL SEL SUBCLAVIAN INNOMINATE UNI L MOD SED  10/16/2018   IR ANGIO VERTEBRAL SEL VERTEBRAL UNI R MOD SED  10/16/2018   IR GENERIC HISTORICAL  12/24/2015   IR ANGIO INTRA EXTRACRAN SEL COM CAROTID INNOMINATE BILAT MOD SED 12/24/2015 Luellen Sages, MD [facility]   IR GENERIC HISTORICAL  12/24/2015   IR ANGIOGRAM EXTREMITY LEFT 12/24/2015 Luellen Sages, MD [facility]   IR GENERIC HISTORICAL  12/24/2015   IR ANGIO VERTEBRAL SEL VERTEBRAL UNI R MOD SED 12/24/2015 Luellen Sages, MD [facility]   IR GENERIC HISTORICAL  01/05/2016   IR PTA NON CORO-LOWER EXTREM 01/05/2016 Luellen Sages, MD [facility]   IR GENERIC HISTORICAL  03/07/2016   IR RADIOLOGIST EVAL & MGMT 03/07/2016 [facility]   IR RADIOLOGIST EVAL & MGMT  11/30/2022    JOINT REPLACEMENT Left    thumb   LEFT HEART CATH AND CORONARY ANGIOGRAPHY N/A 11/10/2016   Procedure: Left Heart Cath and Coronary Angiography;  Surgeon: Arty Binning, MD;  Location: [facility];  Service: Cardiovascular;  Laterality: N/A;   MASTOID DEBRIDEMENT     MASTOIDECTOMY REVISION     RADIOLOGY WITH ANESTHESIA N/A 11/09/2014   Procedure: STENT PLACEMENT;  Surgeon: Luellen Sages, MD;  Location: [facility];  Service: Radiology;  Laterality: N/A;   RADIOLOGY WITH ANESTHESIA N/A 05/31/2015   Procedure: RADIOLOGY WITH ANESTHESIA;  Surgeon: Luellen Sages, MD;  Location: [facility];  Service: Radiology;  Laterality: N/A;   RADIOLOGY WITH ANESTHESIA N/A 01/05/2016   Procedure: RADIOLOGY WITH ANESTHESIA ANGIOPLASTY WITH STENTNG;  Surgeon: Idell Majestic  Alvira Josephs, MD;  Location: [facility];  Service: Radiology;  Laterality: N/A;   RADIOLOGY WITH ANESTHESIA N/A 10/16/2018   Procedure: STENTING;  Surgeon: Luellen Sages, MD;  Location: [facility];  Service: Radiology;  Laterality: N/A;   skin cancer excised  04/2013   Melonoma   SPINE SURGERY     cervical fusion, returned to OR for post op  bleeding   TONSILLECTOMY     TONSILLECTOMY     TUBAL LIGATION     VAGINA SURGERY     anterior posterior repair    OB History     Gravida  2   Para  2   Term  1   Preterm  1   AB      Living  2      SAB      IAB      Ectopic      Multiple      Live Births               Home Medications    Prior to Admission medications   Medication Sig Start Date End Date Taking? Authorizing Provider  ALPRAZolam  (XANAX ) 0.5 MG tablet Take ONE-HALF to 1 tablets (0.25-0.5 mg total) by mouth at bedtime as needed 09/06/23  Yes Burns, Beckey Bourgeois, MD  apixaban  (ELIQUIS ) 5 MG TABS tablet Take 1 tablet (5 mg total) by mouth 2 (two) times daily. 08/28/23  Yes Turner, Rufus Council, MD  cetirizine (ZYRTEC) 10 MG tablet Take 10 mg by mouth every morning.   Yes [provider]  clopidogrel  (PLAVIX ) 75 MG  tablet TAKE 1 TABLET BY MOUTH EVERY DAY 07/09/23  Yes Burns, Beckey Bourgeois, MD  diclofenac  (VOLTAREN ) 50 MG EC tablet Take 1 tablet (50 mg total) by mouth daily as needed. 11/29/22  Yes Burns, Beckey Bourgeois, MD  diltiazem  (CARDIZEM  CD) 180 MG 24 hr capsule TAKE 1 CAPSULE (180 MG TOTAL) BY MOUTH AT BEDTIME. 01/11/23  Yes Turner, Rufus Council, MD  estradiol  (ESTRACE ) 0.1 MG/GM vaginal cream Place 1 Applicatorful vaginally 2 (two) times a week. 08/28/22  Yes Burns, Beckey Bourgeois, MD  famotidine  (PEPCID ) 40 MG tablet Take 1 tablet (40 mg total) by mouth daily. 01/11/23  Yes Burns, Beckey Bourgeois, MD  ferrous sulfate  325 (65 FE) MG tablet Take 325 mg by mouth daily with breakfast.   Yes [provider]  fluticasone  (FLONASE ) 50 MCG/ACT nasal spray Place into both nostrils daily.   Yes [provider]  furosemide  (LASIX ) 20 MG tablet TAKE 1 TABLET (20 MG TOTAL) BY MOUTH DAILY. MAY TAKE AN ADDITIONAL TABLET FOR EDEMA 01/11/23  Yes Turner, Rufus Council, MD  losartan  (COZAAR ) 100 MG tablet TAKE 1 TABLET BY MOUTH EVERY DAY 04/12/23  Yes Turner, Traci R, MD  methocarbamol  (ROBAXIN ) 500 MG tablet TAKE 1 TABLET (500 MG TOTAL) BY MOUTH DAILY AS NEEDED FOR MUSCLE SPASMS. 07/18/22  Yes Burns, Beckey Bourgeois, MD  metoprolol  succinate (TOPROL -XL) 50 MG 24 hr tablet Take 50 mg by mouth as needed (svt). Take with or immediately following a meal. -   Yes [provider]  mirabegron  ER (MYRBETRIQ ) 50 MG TB24 tablet Take 1 tablet (50 mg total) by mouth daily. 03/14/23  Yes Burns, Beckey Bourgeois, MD  potassium chloride  (KLOR-CON ) 10 MEQ tablet TAKE 1 TABLET BY MOUTH EVERY DAY 07/09/23  Yes Burns, Beckey Bourgeois, MD  rosuvastatin  (CRESTOR ) 20 MG tablet TAKE 1 TABLET BY MOUTH EVERYDAY AT BEDTIME 07/09/23  Yes Burns, Beckey Bourgeois, MD  SUMAtriptan  (  IMITREX ) 50 MG tablet Take 50 mg by mouth every 2 (two) hours as needed for migraine. May repeat in 2 hours if headache persists or recurs.   Yes [provider]  traZODone  (DESYREL ) 100 MG tablet TAKE 1 TABLET AT  BEDTIME. PER MD RETURN IN ABOUT 6 MONTHS (AROUND 11/22/2022) FOR PHYSICAL EXAM. 07/09/23  Yes Burns, Beckey Bourgeois, MD  vitamin B-12 (CYANOCOBALAMIN ) 500 MCG tablet Take 500 mcg by mouth daily.   Yes [provider]    Family History Family History  Problem Relation Age of Onset   Heart disease Father    Heart disease Sister    Heart attack Sister    Heart disease Brother    Heart attack Brother    Stroke Son    Hyperlipidemia Other    Hypertension Other    Cancer Other        lung, esophagus, stomach   Stroke Other    Breast cancer Neg Hx    BRCA 1/2 Neg Hx     Social History Social History   Tobacco Use   Smoking status: Former    Current packs/day: 0.00    Average packs/day: 2.0 packs/day for 22.0 years (44.0 ttl pk-yrs)    Types: Cigarettes    Start date: 04/24/1963    Quit date: 04/23/1985    Years since quitting: 38.4   Smokeless tobacco: Never  Vaping Use   Vaping status: Never Used  Substance Use Topics   Alcohol use: Yes    Alcohol/week: 4.0 standard drinks of alcohol    Types: 4 Glasses of wine per week    Comment: 1 drink a  day   Drug use: No     Allergies   Phenergan  [promethazine  hcl], Daypro [oxaprozin], Lisinopril , Oxycodone , Simvastatin, and Tramadol    Review of Systems Review of Systems  Constitutional:  Positive for fatigue.  Respiratory:  Positive for shortness of breath.   Neurological:  Positive for light-headedness.     Physical Exam Triage Vital Signs ED Triage Vitals  Encounter Vitals Group     BP 09/15/23 1056 125/60     Systolic BP Percentile --      Diastolic BP Percentile --      Pulse Rate 09/15/23 1056 77     Resp 09/15/23 1056 16     Temp 09/15/23 1056 98.2 F (36.8 C)     Temp Source 09/15/23 1056 Oral     SpO2 09/15/23 1056 97 %     Weight 09/15/23 1055 143 lb (64.9 kg)     Height 09/15/23 1055 5\' 3"  (1.6 m)     Head Circumference --      Peak Flow --      Pain Score 09/15/23 1055 0     Pain Loc --      Pain  Education --      Exclude from Growth Chart --    No data found.  Updated Vital Signs BP 125/60 (BP Location: Right Arm)   Pulse 77   Temp 98.2 F (36.8 C) (Oral)   Resp 16   Ht 5\' 3"  (1.6 m)   Wt 143 lb (64.9 kg)   LMP  (LMP Unknown)   SpO2 97%   BMI 25.33 kg/m   Visual Acuity Right Eye Distance:   Left Eye Distance:   Bilateral Distance:    Right Eye Near:   Left Eye Near:    Bilateral Near:     Physical Exam Vitals and nursing note reviewed.  Constitutional:      General: She is not in acute distress.    Appearance: Normal appearance. She is not ill-appearing.  HENT:     Head: Normocephalic and atraumatic.  Eyes:     Pupils: Pupils are equal, round, and reactive to light.  Cardiovascular:     Rate and Rhythm: Normal rate and regular rhythm.     Heart sounds: Normal heart sounds.  Pulmonary:     Effort: Pulmonary effort is normal.     Breath sounds: Normal breath sounds. No wheezing or rhonchi.  Musculoskeletal:     Right lower leg: No edema.     Left lower leg: No edema.  Skin:    General: Skin is warm and dry.  Neurological:     General: No focal deficit present.     Mental Status: She is alert and oriented to person, place, and time.  Psychiatric:        Mood and Affect: Mood normal.        Behavior: Behavior normal.      UC Treatments / Results  Labs (all labs ordered are listed, but only abnormal results are displayed) Labs Reviewed - No data to display  EKG   Radiology No results found.  Procedures Procedures (including critical care time)  Medications Ordered in UC Medications - No data to display  Initial Impression / Assessment and Plan / UC Course  I have reviewed the triage vital signs and the nursing notes.  Pertinent labs & imaging results that were available during my care of the patient were reviewed by me and considered in my medical decision making (see chart for details).     I reviewed exam and symptoms with  patient.  Discussed limitations and abilities of urgent care.  Discussed numerous causes of potential shortness of breath/fatigue and dizziness.  She declined EKG or chest x-ray.  Given her medical history and her persistent worsening symptoms I did advise that she go to the emergency room for further evaluation.  She is in agreement with plan will go POV to the emergency room.  She was instructed to pull over and call 911 for any worsening symptoms that occur in transit and she verbalized understanding. Final Clinical Impressions(s) / UC Diagnoses   Final diagnoses:  Shortness of breath  Other fatigue     Discharge Instructions      Go to the ER for further evaluation of your symptoms  ED Prescriptions   None    PDMP not reviewed this encounter.   Alleen Arbour, NP 09/15/23 1126

## 2023-09-15 NOTE — ED Notes (Signed)
 Patient is being discharged from the Urgent Care and sent to the Emergency Department via POV . Per Ramonita Burow, NP, patient is in need of higher level of care due to needing higher level of care. Patient is aware and verbalizes understanding of plan of care.  Vitals:   09/15/23 1056  BP: 125/60  Pulse: 77  Resp: 16  Temp: 98.2 F (36.8 C)  SpO2: 97%

## 2023-09-15 NOTE — Progress Notes (Signed)
SCD placed per order. 
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
## 2023-09-15 NOTE — ED Triage Notes (Signed)
 Pt endorsing short of breath for 2 days, feeling faint at times. Thursday had head pressure (felt like her head was thumping).  Spoke with cardiologist.  Pt is on plavix  for stents and started eliquis  for new onset afib.

## 2023-09-15 NOTE — ED Provider Notes (Signed)
 [place] EMERGENCY DEPARTMENT AT [facility] Provider Note   CSN: [id number] Arrival date & time: 09/15/23  1138     History  No chief complaint on file.   Susan Gillespie is a 78 y.o. female.  78 year old female with past medical history of atrial fibrillation on Eliquis  as well has as well as hypertension presenting to the emergency department today with worsening fatigue, dyspnea, and lightheadedness.  The patient states that this was acutely worse this morning after she had a large bowel movement.  She reports that this was dark in color.  She denies any frank blood in her stools.  She has not had any vomiting.  She states that she was recently started on Eliquis  in addition to Plavix  for the atrial fibrillation.  She states that she has been having gradually worsening symptoms since then.        Home Medications Prior to Admission medications   Medication Sig Start Date End Date Taking? Authorizing Provider  ALPRAZolam  (XANAX ) 0.5 MG tablet Take ONE-HALF to 1 tablets (0.25-0.5 mg total) by mouth at bedtime as needed 09/06/23   Colene Dauphin, MD  apixaban  (ELIQUIS ) 5 MG TABS tablet Take 1 tablet (5 mg total) by mouth 2 (two) times daily. 08/28/23   Jacqueline Matsu, MD  cetirizine (ZYRTEC) 10 MG tablet Take 10 mg by mouth every morning.    [provider]  clopidogrel  (PLAVIX ) 75 MG tablet TAKE 1 TABLET BY MOUTH EVERY DAY 07/09/23   Colene Dauphin, MD  diclofenac  (VOLTAREN ) 50 MG EC tablet Take 1 tablet (50 mg total) by mouth daily as needed. 11/29/22   Colene Dauphin, MD  diltiazem  (CARDIZEM  CD) 180 MG 24 hr capsule TAKE 1 CAPSULE (180 MG TOTAL) BY MOUTH AT BEDTIME. 01/11/23   Turner, Rufus Council, MD  estradiol  (ESTRACE ) 0.1 MG/GM vaginal cream Place 1 Applicatorful vaginally 2 (two) times a week. 08/28/22   Colene Dauphin, MD  famotidine  (PEPCID ) 40 MG tablet Take 1 tablet (40 mg total) by mouth daily. 01/11/23   Colene Dauphin, MD  ferrous sulfate  325 (65 FE) MG tablet  Take 325 mg by mouth daily with breakfast.    [provider]  fluticasone  (FLONASE ) 50 MCG/ACT nasal spray Place into both nostrils daily.    [provider]  furosemide  (LASIX ) 20 MG tablet TAKE 1 TABLET (20 MG TOTAL) BY MOUTH DAILY. MAY TAKE AN ADDITIONAL TABLET FOR EDEMA 01/11/23   Jacqueline Matsu, MD  losartan  (COZAAR ) 100 MG tablet TAKE 1 TABLET BY MOUTH EVERY DAY 04/12/23   Turner, Rufus Council, MD  methocarbamol  (ROBAXIN ) 500 MG tablet TAKE 1 TABLET (500 MG TOTAL) BY MOUTH DAILY AS NEEDED FOR MUSCLE SPASMS. 07/18/22   Burns, Beckey Bourgeois, MD  metoprolol  succinate (TOPROL -XL) 50 MG 24 hr tablet Take 50 mg by mouth as needed (svt). Take with or immediately following a meal. -    [provider]  mirabegron  ER (MYRBETRIQ ) 50 MG TB24 tablet Take 1 tablet (50 mg total) by mouth daily. 03/14/23   Colene Dauphin, MD  potassium chloride  (KLOR-CON ) 10 MEQ tablet TAKE 1 TABLET BY MOUTH EVERY DAY 07/09/23   Colene Dauphin, MD  rosuvastatin  (CRESTOR ) 20 MG tablet TAKE 1 TABLET BY MOUTH EVERYDAY AT BEDTIME 07/09/23   Colene Dauphin, MD  SUMAtriptan  (IMITREX ) 50 MG tablet Take 50 mg by mouth every 2 (two) hours as needed for migraine. May repeat in 2 hours if headache persists or  recurs.    [provider]  traZODone  (DESYREL ) 100 MG tablet TAKE 1 TABLET AT BEDTIME. PER MD RETURN IN ABOUT 6 MONTHS (AROUND 11/22/2022) FOR PHYSICAL EXAM. 07/09/23   Colene Dauphin, MD  vitamin B-12 (CYANOCOBALAMIN ) 500 MCG tablet Take 500 mcg by mouth daily.    [provider]      Allergies    Phenergan  [promethazine  hcl], Daypro [oxaprozin], Lisinopril , Oxycodone , Simvastatin, and Tramadol     Review of Systems   Review of Systems  Constitutional:  Positive for fatigue.  Respiratory:  Positive for shortness of breath.   Neurological:  Positive for light-headedness.  All other systems reviewed and are negative.   Physical Exam Updated Vital Signs BP (!) 121/43   Pulse 63   Temp 97.8 F  (36.6 C)   Resp 19   LMP  (LMP Unknown)   SpO2 94%  Physical Exam Vitals and nursing note reviewed.   Gen: NAD, pale appearing Eyes: PERRL, EOMI HEENT: no oropharyngeal swelling Neck: trachea midline Resp: clear to auscultation bilaterally Card: RRR, no murmurs, rubs, or gallops Abd: nontender, nondistended Extremities: no calf tenderness, no edema Vascular: 2+ radial pulses bilaterally, 2+ DP pulses bilaterally Neuro: No focal deficits Skin: no rashes Psyc: acting appropriately   ED Results / Procedures / Treatments   Labs (all labs ordered are listed, but only abnormal results are displayed) Labs Reviewed  BASIC METABOLIC PANEL WITH GFR - Abnormal; Notable for the following components:      Result Value   Glucose, Bld 118 (*)    BUN 40 (*)    Creatinine, Ser 1.47 (*)    GFR, Estimated 36 (*)    All other components within normal limits  CBC - Abnormal; Notable for the following components:   RBC 2.04 (*)    Hemoglobin 6.9 (*)    HCT 21.1 (*)    MCV 103.4 (*)    All other components within normal limits  PRO BRAIN NATRIURETIC PEPTIDE - Abnormal; Notable for the following components:   Pro Brain Natriuretic Peptide 1,258.0 (*)    All other components within normal limits  OCCULT BLOOD X 1 CARD TO LAB, STOOL  TROPONIN T, HIGH SENSITIVITY    EKG EKG Interpretation Date/Time:  Saturday Sep 15 2023 12:10:03 EDT Ventricular Rate:  66 PR Interval:  156 QRS Duration:  84 QT Interval:  416 QTC Calculation: 436 R Axis:   35  Text Interpretation: Sinus rhythm Confirmed by Abner Hoffman [phone]) on 09/15/2023 12:31:01 PM  Radiology DG Chest Port 1 View Result Date: 09/15/2023 CLINICAL DATA:  Shortness of breath EXAM: PORTABLE CHEST 1 VIEW COMPARISON:  Chest x-ray performed May 09, 2016 FINDINGS: The heart size and mediastinal contours are within normal limits. Both lungs are clear. The visualized skeletal structures are unchanged. IMPRESSION: No active disease.  Electronically Signed   By: Reagan Camera M.D.   On: 09/15/2023 12:33    Procedures Procedures    Medications Ordered in ED Medications  pantoprazole  (PROTONIX ) injection 80 mg (has no administration in time range)    ED Course/ Medical Decision Making/ A&P                                 Medical Decision Making 78 year old female with past medical history of atrial fibrillation, coronary artery disease, and CHF presenting to the emergency department today with dyspnea on exertion, lightheadedness, and pain between her shoulder blades a  few days ago.  I will further evaluate the patient here with basic labs in addition to an EKG, chest x-ray, and troponin to further evaluate for ACS, pulmonary edema, pulmonary infiltrates, pneumothorax, anemia, or electrolyte abnormalities.  The patient did bring a stool sample here and denies any frank bleeding.  Will send this down for Hemoccult.  I will reevaluate for ultimate disposition.  Her initial EKG does not show any ischemic changes on my read.  The patient's hemoglobin did come back low at 6.9.  Calls placed to hospitalist service as we do not have the ability to type and screen and transfuse patients in the freestanding ER.  She will be admitted for further evaluation management.  Will give her Protonix  for possible upper GI bleed.  Amount and/or Complexity of Data Reviewed Labs: ordered. Radiology: ordered.  Risk Decision regarding hospitalization.           Final Clinical Impression(s) / ED Diagnoses Final diagnoses:  Symptomatic anemia    Rx / DC Orders ED Discharge Orders     None         Carin Charleston, MD 09/15/23 1258

## 2023-09-15 NOTE — ED Notes (Signed)
Tequila with  cl called for transport 

## 2023-09-15 NOTE — ED Notes (Signed)
 CRITICAL VALUE STICKER  CRITICAL VALUE:Hgb 6.9  RECEIVER (on-site recipient of call):Ami Balboa, RN  DATE & TIME NOTIFIED:   MESSENGER (representative from lab):  MD NOTIFIED: Tee  TIME OF NOTIFICATION:1236  RESPONSE:

## 2023-09-15 NOTE — ED Notes (Signed)
 Purple man Green.

## 2023-09-15 NOTE — Plan of Care (Addendum)
 Hospital Medicine Transfer Accept Note Patient Name/Age: Susan Gillespie / 78 y.o. MRN: [id number] Admission Date: 09/15/2023  Once successfully transferred to the appropriate floor, TR[facility] will assume care for the patient above.  A/P: 67F h/o Afib (on Eliquis ), CAD (on Plavix ) p/w lightheadedness and fatigue iso reported dark stools. AFVSS. Hb 6.9 in ED. FOBT positive.  Admission (Multimorbid) adult order set completed.  UGIB -Consult GI on arrival -IV PPI BID ordered  Arne Langdon, MD Attending Physician Assistant Professor Division of [facility] Medicine [facility] Sep 15, 2023 1:45 PM

## 2023-09-15 NOTE — ED Triage Notes (Signed)
 Pt states that she has some sob and fatigue. X2 weeks Pt states that she started taking Plavix  and Eliquis  2 weeks ago.   Pt states that she also had a bowel movement that was larger than usual in which she brought in a specimen sample.

## 2023-09-15 NOTE — ED Notes (Signed)
 Report given to [facility].

## 2023-09-15 NOTE — Discharge Instructions (Addendum)
Go to the ER for further evaluation of your symptoms 

## 2023-09-15 NOTE — ED Notes (Signed)
 Report attempted to the floor RN x2... [phone].Aaron AasAaron Aas

## 2023-09-15 NOTE — Progress Notes (Signed)
 Patient transferred from [facility] ED. Alert and oriented. On RA. Connected to cardiac monitor box 15. Skin is intact. Will continue to monitor

## 2023-09-16 ENCOUNTER — Encounter (HOSPITAL_COMMUNITY)
Admission: EM | Disposition: A | Discharge status: Home / Self Care | Payer: Self-pay | Source: Home / Self Care | Attending: Internal Medicine

## 2023-09-16 ENCOUNTER — Inpatient Hospital Stay (HOSPITAL_COMMUNITY): Admitting: Certified Registered Nurse Anesthetist

## 2023-09-16 ENCOUNTER — Encounter (HOSPITAL_COMMUNITY): Payer: Self-pay | Admitting: Hospitalist

## 2023-09-16 DIAGNOSIS — D539 Nutritional anemia, unspecified: Secondary | ICD-10-CM

## 2023-09-16 DIAGNOSIS — K219 Gastro-esophageal reflux disease without esophagitis: Secondary | ICD-10-CM | POA: Diagnosis not present

## 2023-09-16 DIAGNOSIS — D62 Acute posthemorrhagic anemia: Secondary | ICD-10-CM

## 2023-09-16 DIAGNOSIS — K921 Melena: Secondary | ICD-10-CM | POA: Diagnosis not present

## 2023-09-16 DIAGNOSIS — K922 Gastrointestinal hemorrhage, unspecified: Secondary | ICD-10-CM

## 2023-09-16 DIAGNOSIS — K2970 Gastritis, unspecified, without bleeding: Secondary | ICD-10-CM

## 2023-09-16 DIAGNOSIS — K3189 Other diseases of stomach and duodenum: Secondary | ICD-10-CM | POA: Diagnosis not present

## 2023-09-16 LAB — BASIC METABOLIC PANEL WITH GFR
Anion gap: 7 (ref 5–15)
BUN: 25 mg/dL — ABNORMAL HIGH (ref 8–23)
CO2: 27 mmol/L (ref 22–32)
Calcium: 9.1 mg/dL (ref 8.9–10.3)
Chloride: 106 mmol/L (ref 98–111)
Creatinine, Ser: 1.38 mg/dL — ABNORMAL HIGH (ref 0.44–1.00)
GFR, Estimated: 39 mL/min — ABNORMAL LOW (ref >60)
Glucose, Bld: 104 mg/dL — ABNORMAL HIGH (ref 70–99)
Potassium: 3.9 mmol/L (ref 3.5–5.1)
Sodium: 140 mmol/L (ref 135–145)

## 2023-09-16 LAB — HEMOGLOBIN AND HEMATOCRIT, BLOOD
HCT: 27.9 % — ABNORMAL LOW (ref 36.0–46.0)
HCT: 29 % — ABNORMAL LOW (ref 36.0–46.0)
Hemoglobin: 9.5 g/dL — ABNORMAL LOW (ref 12.0–15.0)
Hemoglobin: 9.6 g/dL — ABNORMAL LOW (ref 12.0–15.0)

## 2023-09-16 LAB — VITAMIN B12: Vitamin B-12: 494 pg/mL (ref 180–914)

## 2023-09-16 LAB — CBC
HCT: 28.8 % — ABNORMAL LOW (ref 36.0–46.0)
Hemoglobin: 9.8 g/dL — ABNORMAL LOW (ref 12.0–15.0)
MCH: 32.6 pg (ref 26.0–34.0)
MCHC: 34 g/dL (ref 30.0–36.0)
MCV: 95.7 fL (ref 80.0–100.0)
Platelets: 252 10*3/uL (ref 150–400)
RBC: 3.01 MIL/uL — ABNORMAL LOW (ref 3.87–5.11)
RDW: 17.2 % — ABNORMAL HIGH (ref 11.5–15.5)
WBC: 6.8 10*3/uL (ref 4.0–10.5)
nRBC: 0 % (ref 0.0–0.2)

## 2023-09-16 LAB — BPAM RBC: Blood Product Expiration Date: 202506162359

## 2023-09-16 LAB — FOLATE: Folate: 16.2 ng/mL (ref >5.9)

## 2023-09-16 LAB — IRON AND TIBC
Iron: 262 ug/dL — ABNORMAL HIGH (ref 28–170)
Saturation Ratios: 80 % — ABNORMAL HIGH (ref 10.4–31.8)
TIBC: 326 ug/dL (ref 250–450)
UIBC: 64 ug/dL

## 2023-09-16 LAB — FERRITIN: Ferritin: 31 ng/mL (ref 11–307)

## 2023-09-16 SURGERY — EGD (ESOPHAGOGASTRODUODENOSCOPY)
Anesthesia: Monitor Anesthesia Care

## 2023-09-16 MED ORDER — BOOST / RESOURCE BREEZE PO LIQD CUSTOM
1.0000 | Freq: Three times a day (TID) | ORAL | Status: DC
  Administered 2023-09-16 – 2023-09-18 (×3): 1 via ORAL
  Administered 2023-09-18 (×2): 237 mL via ORAL
  Administered 2023-09-19: 1 via ORAL

## 2023-09-16 MED ORDER — PROPOFOL 10 MG/ML IV BOLUS
INTRAVENOUS | Status: DC | PRN
  Administered 2023-09-16: 80 mg via INTRAVENOUS

## 2023-09-16 MED ORDER — PHENYLEPHRINE HCL-NACL 20-0.9 MG/250ML-% IV SOLN
INTRAVENOUS | Status: DC | PRN
  Administered 2023-09-16: 25 ug/min via INTRAVENOUS

## 2023-09-16 MED ORDER — ONDANSETRON HCL 4 MG/2ML IJ SOLN
4.0000 mg | Freq: Four times a day (QID) | INTRAMUSCULAR | Status: DC | PRN
  Administered 2023-09-16: 4 mg via INTRAVENOUS
  Filled 2023-09-16: qty 2

## 2023-09-16 MED ORDER — SIMETHICONE 80 MG PO CHEW
240.0000 mg | CHEWABLE_TABLET | Freq: Once | ORAL | Status: AC
  Administered 2023-09-16: 240 mg via ORAL
  Filled 2023-09-16: qty 3

## 2023-09-16 MED ORDER — PANTOPRAZOLE SODIUM 40 MG PO TBEC
40.0000 mg | DELAYED_RELEASE_TABLET | Freq: Every day | ORAL | Status: DC
  Administered 2023-09-18 – 2023-09-20 (×3): 40 mg via ORAL
  Filled 2023-09-16 (×4): qty 1

## 2023-09-16 MED ORDER — PROPOFOL 500 MG/50ML IV EMUL
INTRAVENOUS | Status: DC | PRN
  Administered 2023-09-16: 200 ug/kg/min via INTRAVENOUS

## 2023-09-16 MED ORDER — NA SULFATE-K SULFATE-MG SULF 17.5-3.13-1.6 GM/177ML PO SOLN
0.5000 | Freq: Once | ORAL | Status: AC
  Administered 2023-09-16: 177 mL via ORAL
  Filled 2023-09-16: qty 1

## 2023-09-16 MED ORDER — SODIUM CHLORIDE 0.9 % IV SOLN: INTRAVENOUS | Status: DC

## 2023-09-16 MED ORDER — EPINEPHRINE 1 MG/10ML IJ SOSY
PREFILLED_SYRINGE | INTRAMUSCULAR | Status: AC
  Filled 2023-09-16: qty 10

## 2023-09-16 MED ORDER — SODIUM CHLORIDE 0.9 % IV SOLN: INTRAVENOUS | Status: DC | PRN

## 2023-09-16 MED ORDER — NA SULFATE-K SULFATE-MG SULF 17.5-3.13-1.6 GM/177ML PO SOLN
0.5000 | Freq: Once | ORAL | Status: AC
  Administered 2023-09-16: 177 mL via ORAL

## 2023-09-16 NOTE — H&P (View-Only) (Signed)
 Consultation Note   Referring Provider:  [facility] PCP: Colene Dauphin, MD Primary Gastroenterologist:   Previously Dr. Andriette Keeling    Reason for Consultation:  GI bleed DOA: 09/15/2023         Hospital Day: 2   ASSESSMENT     Hemodynamically stable GI bleed with melena and acute on chronic symptomatic anemia (macrocytic).  Dark FOBT positive stools on Plavix  and Eliquis  Presenting hemoglobin 6.9, down from 10.8 in March.   PAF On Eliquis  at home  Subclavian steal syndrome, s/p stenting following by angioplasty On chronic plavix   Chronic diastolic heart failure  GERD Takes Pepcid  at home.   See PMH for additional history  Principal Problem:   UGIB (upper gastrointestinal bleed)      PLAN:   --Received 2 units RBCs , awaiting follow up H/H  --Will obtain iron studies realizing that she has already been transfused, B12 and folate levels too --Continue twice daily IV pantoprazole  --N.p.o. --She will need diagnostic EGD today if hgb has improved.  The risks and benefits of EGD with possible biopsies were discussed with the patient who agrees to proceed. Endoscopic therapeutic interventions may be limited since she has Plavix  / Eliquis  on board.    HPI   78 y.o. year old female with a medical history including but not limited to CAD, HFpEF, hypertension, paroxysmal supraventricular tachycardia , PAF on Eliquis ,  left subclavian steal , left subclavian stenting followed later by angioplasty /she is on chronic Plavix  , sigmoid resection 2009, ,  GERD.   Patient presented to urgent care yesterday for evaluation of black stool and shortness of breath. Shortness of breath started a couple of weeks ago. Then yesterday she had 2 "blowouts" of black tarry stool.  No BMs or bleeding since. She hasn't really had any associated abdominal pain other than some minor "twinges" of generalized lower abdominal discomfort which she thinks  is related to gas. No N/V. She has been on plavix  for several years, she started Eliquis  a couple of weeks ago. She also takes a daily baby aspiriin and about once every couple of weeks takes a Voltaren . In ED she was found to be severely anemic.    Relevant workup thus far: BUN 40, creatinine 1.47, LFTs unremarkable, WBC 8.5, hemoglobin 6.9, MCV 103, platelets 274  Labs and Imaging:  Recent Labs    09/15/23 1211  WBC 8.5  HGB 6.9*  HCT 21.1*  MCV 103.4*  PLT 274   Recent Labs    09/15/23 1211  NA 139  K 4.1  CL 103  CO2 24  GLUCOSE 118*  BUN 40*  CREATININE 1.47*  CALCIUM  9.8     DG Chest Port 1 View CLINICAL DATA:  Shortness of breath  EXAM: PORTABLE CHEST 1 VIEW  COMPARISON:  Chest x-ray performed May 09, 2016  FINDINGS: The heart size and mediastinal contours are within normal limits. Both lungs are clear. The visualized skeletal structures are unchanged.  IMPRESSION: No active disease.  Electronically Signed   By: Reagan Camera M.D.   On: 09/15/2023 12:33    Pertinent GI Studies    2018 EGD and colonoscopy - Juanita Norlander, MD ( Surgery)  Esophagus:   Normal GE junction  at:  37 cm Stomach:  She has evidence of mild gastritis and a fair amount of bile reflux in her stomach.   Duodenum:   Normal to the 3rd portion of the duodenum.   Colonoscopy.  A digital rectal exam was done at the beginning of the procedure..  The anus and rectum were unremarkable.  The flexible Pentax colonoscope was passed up the rectum without difficulty.  The scope was advanced to the cecum and the ileocecal valve was identified.  The colonic prep was good.   The right colon, transverse colon, left colon, and sigmoid colon were unremarkable.  I could not identify my prior  resection/anastomosis, though this should have been at the 20 cm area.                Past Medical History:  Diagnosis Date   Anxiety    Artery stenosis (HCC)    left internal carotid, left subclavian    Arthritis    Bleeding tendency (HCC) 05/16/2012   Hx post op bleeding (epidural hematoma s/p ACDF '10; LLQ hematoma due to superficial vessel fascial bleed post colon resection; Normal platelet count; saw Dr. Isidor Marek '14   CAD in native artery    a. mod to mod-severe CAD by cath 10/2016, med rx.   Carotid stenosis, bilateral 08/31/2016   1-39% right and 50-69% lefft ICA stenosis dopplers 04/2019   Chronic diastolic CHF (congestive heart failure) (HCC)    Colon polyp    Diverticulitis    Family history of cardiovascular disease    Fracture of right fibula    GERD (gastroesophageal reflux disease)    Headache(784.0)    mirgraines - history of   Hearing aid worn    B/L   HOH (hard of hearing)    wears hearing aids   Hyperlipidemia    Hypertension    IBS (irritable bowel syndrome)    followed by Dr. Andriette Keeling   Liver cyst    Melanoma (HCC)    removed from back 04/2013   PAF (paroxysmal atrial fibrillation) (HCC)    Shortness of breath dyspnea    with exertion   Stroke (HCC) 06/2012   mild   SVT (supraventricular tachycardia) (HCC)    Urinary urgency    UTI (lower urinary tract infection)    x 2 since June 2016   Wears glasses     Past Surgical History:  Procedure Laterality Date   ABDOMINAL HYSTERECTOMY     ANGIOPLASTY     stent in subclavian LEFT 2016, angioplasty of stent 2017   APPENDECTOMY     AUGMENTATION MAMMAPLASTY Bilateral    replaced silicone with saline 2004   BREAST EXCISIONAL BIOPSY Left    benign 1977   BREAST SURGERY     BIL breast augmentation   CHOLECYSTECTOMY     COLON SURGERY     Sigmoid Colectomy, returned for post op bleeding   COLONOSCOPY W/ POLYPECTOMY     COLONOSCOPY WITH PROPOFOL  N/A 05/05/2016   Procedure: COLONOSCOPY WITH PROPOFOL ;  Surgeon: Juanita Norlander, MD;  Location: [facility];  Service: General;  Laterality: N/A;   ESOPHAGOGASTRODUODENOSCOPY (EGD) WITH PROPOFOL  N/A 05/05/2016   Procedure: ESOPHAGOGASTRODUODENOSCOPY (EGD) WITH  PROPOFOL ;  Surgeon: Juanita Norlander, MD;  Location: [facility];  Service: General;  Laterality: N/A;   EYE SURGERY     eyelid drooping fixed   IR ANGIO INTRA EXTRACRAN SEL COM CAROTID INNOMINATE BILAT MOD SED  10/16/2018   IR ANGIO VERTEBRAL SEL SUBCLAVIAN INNOMINATE UNI L MOD  SED  10/16/2018   IR ANGIO VERTEBRAL SEL VERTEBRAL UNI R MOD SED  10/16/2018   IR GENERIC HISTORICAL  12/24/2015   IR ANGIO INTRA EXTRACRAN SEL COM CAROTID INNOMINATE BILAT MOD SED 12/24/2015 Luellen Sages, MD [facility]   IR GENERIC HISTORICAL  12/24/2015   IR ANGIOGRAM EXTREMITY LEFT 12/24/2015 Luellen Sages, MD [facility]   IR GENERIC HISTORICAL  12/24/2015   IR ANGIO VERTEBRAL SEL VERTEBRAL UNI R MOD SED 12/24/2015 Luellen Sages, MD [facility]   IR GENERIC HISTORICAL  01/05/2016   IR PTA NON CORO-LOWER EXTREM 01/05/2016 Luellen Sages, MD [facility]   IR GENERIC HISTORICAL  03/07/2016   IR RADIOLOGIST EVAL & MGMT 03/07/2016 [facility]   IR RADIOLOGIST EVAL & MGMT  11/30/2022   JOINT REPLACEMENT Left    thumb   LEFT HEART CATH AND CORONARY ANGIOGRAPHY N/A 11/10/2016   Procedure: Left Heart Cath and Coronary Angiography;  Surgeon: Arty Binning, MD;  Location: [facility];  Service: Cardiovascular;  Laterality: N/A;   MASTOID DEBRIDEMENT     MASTOIDECTOMY REVISION     RADIOLOGY WITH ANESTHESIA N/A 11/09/2014   Procedure: STENT PLACEMENT;  Surgeon: Luellen Sages, MD;  Location: [facility];  Service: Radiology;  Laterality: N/A;   RADIOLOGY WITH ANESTHESIA N/A 05/31/2015   Procedure: RADIOLOGY WITH ANESTHESIA;  Surgeon: Luellen Sages, MD;  Location: [facility];  Service: Radiology;  Laterality: N/A;   RADIOLOGY WITH ANESTHESIA N/A 01/05/2016   Procedure: RADIOLOGY WITH ANESTHESIA ANGIOPLASTY WITH STENTNG;  Surgeon: Luellen Sages, MD;  Location: [facility];  Service: Radiology;  Laterality: N/A;   RADIOLOGY WITH ANESTHESIA N/A 10/16/2018   Procedure: STENTING;  Surgeon: Luellen Sages,  MD;  Location: [facility];  Service: Radiology;  Laterality: N/A;   skin cancer excised  04/2013   Melonoma   SPINE SURGERY     cervical fusion, returned to OR for post op  bleeding   TONSILLECTOMY     TONSILLECTOMY     TUBAL LIGATION     VAGINA SURGERY     anterior posterior repair    Family History  Problem Relation Age of Onset   Heart disease Father    Heart disease Sister    Heart attack Sister    Heart disease Brother    Heart attack Brother    Stroke Son    Hyperlipidemia Other    Hypertension Other    Cancer Other        lung, esophagus, stomach   Stroke Other    Breast cancer Neg Hx    BRCA 1/2 Neg Hx     Prior to Admission medications   Medication Sig Start Date End Date Taking? Authorizing Provider  ALPRAZolam  (XANAX ) 0.5 MG tablet Take ONE-HALF to 1 tablets (0.25-0.5 mg total) by mouth at bedtime as needed Patient taking differently: Take 0.25-0.5 mg by mouth at bedtime as needed for anxiety. 09/06/23  Yes Burns, Beckey Bourgeois, MD  apixaban  (ELIQUIS ) 5 MG TABS tablet Take 1 tablet (5 mg total) by mouth 2 (two) times daily. 08/28/23  Yes Turner, Rufus Council, MD  ascorbic acid (VITAMIN C) 500 MG tablet Take 500 mg by mouth daily.   Yes [provider]  cetirizine (ZYRTEC) 10 MG tablet Take 10 mg by mouth every morning.   Yes [provider]  clopidogrel  (PLAVIX ) 75 MG tablet TAKE 1 TABLET BY MOUTH EVERY DAY 07/09/23  Yes Burns, Beckey Bourgeois, MD  diclofenac  (VOLTAREN ) 50 MG EC tablet Take 1 tablet (  50 mg total) by mouth daily as needed. Patient taking differently: Take 50 mg by mouth daily as needed for mild pain (pain score 1-3). 11/29/22  Yes Burns, Beckey Bourgeois, MD  diltiazem  (CARDIZEM  CD) 180 MG 24 hr capsule TAKE 1 CAPSULE (180 MG TOTAL) BY MOUTH AT BEDTIME. 01/11/23  Yes Turner, Rufus Council, MD  estradiol  (ESTRACE ) 0.1 MG/GM vaginal cream Place 1 Applicatorful vaginally 2 (two) times a week. 08/28/22  Yes Burns, Beckey Bourgeois, MD  famotidine  (PEPCID ) 40 MG tablet Take 1 tablet (40 mg  total) by mouth daily. Patient taking differently: Take 40 mg by mouth every evening. 01/11/23  Yes Burns, Beckey Bourgeois, MD  ferrous sulfate  325 (65 FE) MG tablet Take 325 mg by mouth daily with breakfast.   Yes [provider]  fluticasone  (FLONASE ) 50 MCG/ACT nasal spray Place 1 spray into both nostrils daily as needed for allergies.   Yes [provider]  furosemide  (LASIX ) 20 MG tablet TAKE 1 TABLET (20 MG TOTAL) BY MOUTH DAILY. MAY TAKE AN ADDITIONAL TABLET FOR EDEMA 01/11/23  Yes Turner, Rufus Council, MD  losartan  (COZAAR ) 100 MG tablet TAKE 1 TABLET BY MOUTH EVERY DAY 04/12/23  Yes Turner, Traci R, MD  methocarbamol  (ROBAXIN ) 500 MG tablet TAKE 1 TABLET (500 MG TOTAL) BY MOUTH DAILY AS NEEDED FOR MUSCLE SPASMS. 07/18/22  Yes Burns, Beckey Bourgeois, MD  metoprolol  succinate (TOPROL -XL) 50 MG 24 hr tablet Take 50 mg by mouth as needed (svt). Take with or immediately following a meal. -   Yes [provider]  mirabegron  ER (MYRBETRIQ ) 50 MG TB24 tablet Take 1 tablet (50 mg total) by mouth daily. Patient taking differently: Take 25 mg by mouth 2 (two) times daily. 03/14/23  Yes Burns, Beckey Bourgeois, MD  nitroGLYCERIN  (NITROSTAT ) 0.4 MG SL tablet Place 0.4 mg under the tongue every 5 (five) minutes as needed for chest pain.   Yes [provider]  Omega-3 Fatty Acids (FISH OIL PO) Take 1 tablet by mouth daily.   Yes [provider]  oxyCODONE -acetaminophen  (PERCOCET/ROXICET) 5-325 MG tablet Take 0.5 tablets by mouth every 6 (six) hours as needed for moderate pain (pain score 4-6).   Yes [provider]  potassium chloride  (KLOR-CON ) 10 MEQ tablet TAKE 1 TABLET BY MOUTH EVERY DAY 07/09/23  Yes Burns, Beckey Bourgeois, MD  rosuvastatin  (CRESTOR ) 20 MG tablet TAKE 1 TABLET BY MOUTH EVERYDAY AT BEDTIME 07/09/23  Yes Burns, Beckey Bourgeois, MD  traZODone  (DESYREL ) 100 MG tablet TAKE 1 TABLET AT BEDTIME. PER MD RETURN IN ABOUT 6 MONTHS (AROUND 11/22/2022) FOR PHYSICAL EXAM. 07/09/23  Yes Burns, Beckey Bourgeois, MD  vitamin B-12 (CYANOCOBALAMIN ) 500 MCG tablet Take 500 mcg by mouth daily.   Yes [provider]    Current Facility-Administered Medications  Medication Dose Route Frequency Provider Last Rate Last Admin   0.9 %  sodium chloride  infusion (Manually program via Guardrails IV Fluids)   Intravenous Once Arne Langdon, MD       0.9 %  sodium chloride  infusion   Intravenous Continuous Arne Langdon, MD   Stopped at 09/15/23 2251   ALPRAZolam  (XANAX ) tablet 0.25 mg  0.25 mg Oral QHS PRN Moore, Willie, MD   0.25 mg at 09/15/23 2232   diltiazem  (CARDIZEM  CD) 24 hr capsule 180 mg  180 mg Oral QHS Arne Langdon, MD   180 mg at 09/15/23 2226   famotidine  (PEPCID ) tablet 40 mg  40 mg Oral Daily Moore, Willie, MD   40 mg  at 09/15/23 1933   metoprolol  succinate (TOPROL -XL) 24 hr tablet 50 mg  50 mg Oral PRN Arne Langdon, MD       pantoprazole  (PROTONIX ) injection 40 mg  40 mg Intravenous Q12H Arne Langdon, MD   40 mg at 09/15/23 1812   rosuvastatin  (CRESTOR ) tablet 10 mg  10 mg Oral Daily Arne Langdon, MD   10 mg at 09/15/23 1933   traZODone  (DESYREL ) tablet 100 mg  100 mg Oral QHS Arne Langdon, MD   100 mg at 09/15/23 2226    Allergies as of 09/15/2023 - Review Complete 09/15/2023  Allergen Reaction Noted   Phenergan  [promethazine  hcl] Other (See Comments) 10/10/2018   Daypro [oxaprozin] Swelling 09/12/2011   Lisinopril   09/25/2011   Oxycodone  Itching 04/16/2020   Simvastatin  04/28/2009   Tramadol   10/29/2018    Social History   Socioeconomic History   Marital status: Divorced    Spouse name: Not on file   Number of children: 2   Years of education: 14   Highest education level: Associate degree: occupational, Scientist, product/process development, or vocational program  Occupational History   Occupation: Academic librarian: [employer]    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[employer], retired  Tobacco Use   Smoking status: Former    Current packs/day: 0.00    Average packs/day: 2.0 packs/day for  22.0 years (44.0 ttl pk-yrs)    Types: Cigarettes    Start date: 04/24/1963    Quit date: 04/23/1985    Years since quitting: 38.4   Smokeless tobacco: Never  Vaping Use   Vaping status: Never Used  Substance and Sexual Activity   Alcohol use: Yes    Alcohol/week: 4.0 standard drinks of alcohol    Types: 4 Glasses of wine per week    Comment: 1 drink a  day   Drug use: No   Sexual activity: Not Currently    Birth control/protection: Surgical  Other Topics Concern   Not on file  Social History Narrative   ** Merged History Encounter **       Domestic partner   Regular exercise-no   Right handed   Caffeine use-- 2 cups coffee daily, rare soda   Social Drivers of Corporate investment banker Strain: Low Risk  (05/20/2023)   Overall Financial Resource Strain (CARDIA)    Difficulty of Paying Living Expenses: Not hard at all  Food Insecurity: No Food Insecurity (09/15/2023)   Hunger Vital Sign    Worried About Running Out of Food in the Last Year: Never true    Ran Out of Food in the Last Year: Never true  Transportation Needs: No Transportation Needs (09/15/2023)   PRAPARE - Administrator, Civil Service (Medical): No    Lack of Transportation (Non-Medical): No  Physical Activity: Insufficiently Active (05/20/2023)   Exercise Vital Sign    Days of Exercise per Week: 2 days    Minutes of Exercise per Session: 30 min  Stress: Stress Concern Present (05/20/2023)   [employer] of Occupational Health - Occupational Stress Questionnaire    Feeling of Stress : To some extent  Social Connections: Moderately Isolated (09/15/2023)   Social Connection and Isolation Panel [NHANES]    Frequency of Communication with Friends and Family: More than three times a week    Frequency of Social Gatherings with Friends and Family: More than three times a week    Attends Religious Services: 1 to 4 times per year  Active Member of Clubs or Organizations: No    Attends Banker  Meetings: Never    Marital Status: Divorced  Catering manager Violence: Not At Risk (09/15/2023)   Humiliation, Afraid, Rape, and Kick questionnaire    Fear of Current or Ex-Partner: No    Emotionally Abused: No    Physically Abused: No    Sexually Abused: No     Code Status   Code Status: Full Code  Review of Systems: All systems reviewed and negative except where noted in HPI.  Physical Exam: Vital signs in last 24 hours: Temp:  [87.8 F (31 C)-98.8 F (37.1 C)] 98 F (36.7 C) (06/01 0801) Pulse Rate:  [58-77] 66 (06/01 0801) Resp:  [14-20] 19 (06/01 0801) BP: (101-143)/(32-65) 119/44 (06/01 0801) SpO2:  [94 %-100 %] 99 % (06/01 0801) Weight:  [64.9 kg-67 kg] 67 kg (05/31 1800) Last BM Date : 09/15/23  General:  Pleasant female in NAD Psych:  Cooperative. Normal mood and affect Eyes: Pupils equal Ears:  Normal auditory acuity Nose: No deformity, discharge or lesions Neck:  Supple, no masses felt Lungs:  Clear to auscultation.  Heart:  Regular rate, regular rhythm.  Abdomen:  Soft, nondistended, nontender, active bowel sounds, no masses felt Rectal :  Deferred Msk: Symmetrical without gross deformities.  Neurologic:  Alert, oriented, grossly normal neurologically Extremities : No edema Skin:  Intact without significant lesions.    Intake/Output from previous day: 05/31 0701 - 06/01 0700 In: 747.1 [I.V.:341.1; Blood:406] Out: -  Intake/Output this shift:  Total I/O In: 305 [Blood:305] Out: -    Mai Schwalbe, NP-C   09/16/2023, 8:14 AM  I have taken an interval history, thoroughly reviewed the chart and examined the patient. I agree with the Advanced Practitioner's note, impression and recommendations, and have recorded additional findings, impressions and recommendations below. I performed a substantive portion of this encounter (>50% time spent), including a complete performance of the medical decision making.  My additional thoughts are as  follows:  78 year old woman with paroxysmal A-fib, CAD, subclavian steal on Plavix  and Eliquis  here with acute GI bleeding and severe acute blood loss anemia.  Suspect upper source, probably ulcer less likely Dieulafoy's. Hemodynamically stable. It took some calls and messages to facilitate a stat CBC this morning, but it was done and came back with a hemoglobin of 9.8 and persistently normal platelets.    Upper endoscopy today with possible endoscopic hemostasis therapy if needed. Agreeable after discussion of procedure and risks.  The benefits and risks of the planned procedure(s) were described in detail with the patient or (when appropriate) their health care proxy.  Risks were outlined as including, but not limited to, bleeding, infection, perforation, adverse medication reaction leading to cardiac or pulmonary decompensation, pancreatitis (if ERCP).  The limitation of incomplete mucosal visualization was also discussed.  No guarantees or warranties were given.  Patient at increased risk for cardiopulmonary complications of procedure due to medical comorbidities.    Kerby Pearson III Office:[phone]

## 2023-09-16 NOTE — Op Note (Signed)
 [facility] Patient Name: Susan Gillespie Procedure Date : 09/16/2023 MRN: [id number] Attending MD: Roel Clarity. Dominic Friendly , MD, [id number] Date of Birth: 10/13/1945 CSN: [id number] Age: 78 Admit Type: Inpatient Procedure:                Upper GI endoscopy Indications:              Acute post hemorrhagic anemia, Melena (ongoing                            today) Providers:                Roel Clarity. Dominic Friendly, MD, Perla Bradford, RN, Tyrus Gallus, Technician Referring MD:             [facility] Medicines:                Monitored Anesthesia Care Complications:            No immediate complications. Estimated Blood Loss:     Estimated blood loss was minimal. Procedure:                Pre-Anesthesia Assessment:                           - Prior to the procedure, a History and Physical                            was performed, and patient medications and                            allergies were reviewed. The patient's tolerance of                            previous anesthesia was also reviewed. The risks                            and benefits of the procedure and the sedation                            options and risks were discussed with the patient.                            All questions were answered, and informed consent                            was obtained. Prior Anticoagulants: The patient                            last took Eliquis  (apixaban ) 2 days and Plavix                             (clopidogrel ) 2 days prior to the procedure. ASA  Grade Assessment: III - A patient with severe                            systemic disease. After reviewing the risks and                            benefits, the patient was deemed in satisfactory                            condition to undergo the procedure.                           After obtaining informed consent, the endoscope was                            passed under direct  vision. Throughout the                            procedure, the patient's blood pressure, pulse, and                            oxygen saturations were monitored continuously. The                            adult endoscope was inserted through the mouth and                            advanced to the second portion of the duodenum. As                            no bleeding source was encountered and patient was                            still passing melena today, the scope was removed                            and changed for pediatric colonoscope to perform a                            small bowel enteroscopy. The [id number]-[id number] ([id number])                            Olympus endoscope was introduced through the mouth,                            and advanced to the jejunum. The upper GI endoscopy                            was accomplished without difficulty. The patient                            tolerated the procedure well. Scope In: Scope Out: Findings:      The esophagus was normal.  Striped erythematous and bile-stained mucosa without bleeding was found       in the entire examined stomach. Biopsies were taken with a cold forceps       for histology. (Antrum and body in same pathology jar to rule out H.       pylori)      The exam of the stomach was otherwise normal. There was clear bile in       the stomach, duodenum and proximal jejunum (extent of scope insertion)      The examined duodenum was normal.      The examined jejunum was normal. Impression:               - Normal esophagus.                           - Erythematous mucosa in the stomach. Biopsied.                            Doubtful clinical significance.                           - Normal examined duodenum.                           - Normal examined jejunum. Recommendation:           - Return patient to hospital ward for ongoing care.                           - Clear liquid diet. N.p.o. at midnight                            - Every 6 hour hemoglobin and hematocrit for 24                            hours                           Pantoprazole  40 mg by mouth once daily x 3 days                           Colonoscopy tomorrow. Procedure Code(s):        --- Professional ---                           [phone], Small intestinal endoscopy, enteroscopy                            beyond second portion of duodenum, not including                            ileum; with biopsy, single or multiple Diagnosis Code(s):        --- Professional ---                           K31.89, Other diseases of stomach and duodenum  D62, Acute posthemorrhagic anemia                           K92.1, Melena (includes Hematochezia) CPT copyright 2022 American Medical Association. All rights reserved. The codes documented in this report are preliminary and upon coder review may  be revised to meet current compliance requirements. Stevey Stapleton L. Dominic Friendly, MD 09/16/2023 1:22:36 PM This report has been signed electronically. Number of Addenda: 0
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
## 2023-09-16 NOTE — Progress Notes (Signed)
 Patient was taken for the procedure at 1210 via wheel chair.

## 2023-09-16 NOTE — Consult Note (Addendum)
 Consultation Note   Referring Provider:  [facility] PCP: Colene Dauphin, MD Primary Gastroenterologist:   Previously Dr. Andriette Keeling    Reason for Consultation:  GI bleed DOA: 09/15/2023         Hospital Day: 2   ASSESSMENT     Hemodynamically stable GI bleed with melena and acute on chronic symptomatic anemia (macrocytic).  Dark FOBT positive stools on Plavix  and Eliquis  Presenting hemoglobin 6.9, down from 10.8 in March.   PAF On Eliquis  at home  Subclavian steal syndrome, s/p stenting following by angioplasty On chronic plavix   Chronic diastolic heart failure  GERD Takes Pepcid  at home.   See PMH for additional history  Principal Problem:   UGIB (upper gastrointestinal bleed)      PLAN:   --Received 2 units RBCs , awaiting follow up H/H  --Will obtain iron studies realizing that she has already been transfused, B12 and folate levels too --Continue twice daily IV pantoprazole  --N.p.o. --She will need diagnostic EGD today if hgb has improved.  The risks and benefits of EGD with possible biopsies were discussed with the patient who agrees to proceed. Endoscopic therapeutic interventions may be limited since she has Plavix  / Eliquis  on board.    HPI   78 y.o. year old female with a medical history including but not limited to CAD, HFpEF, hypertension, paroxysmal supraventricular tachycardia , PAF on Eliquis ,  left subclavian steal , left subclavian stenting followed later by angioplasty /she is on chronic Plavix  , sigmoid resection 2009, ,  GERD.   Patient presented to urgent care yesterday for evaluation of black stool and shortness of breath. Shortness of breath started a couple of weeks ago. Then yesterday she had 2 "blowouts" of black tarry stool.  No BMs or bleeding since. She hasn't really had any associated abdominal pain other than some minor "twinges" of generalized lower abdominal discomfort which she thinks  is related to gas. No N/V. She has been on plavix  for several years, she started Eliquis  a couple of weeks ago. She also takes a daily baby aspiriin and about once every couple of weeks takes a Voltaren . In ED she was found to be severely anemic.    Relevant workup thus far: BUN 40, creatinine 1.47, LFTs unremarkable, WBC 8.5, hemoglobin 6.9, MCV 103, platelets 274  Labs and Imaging:  Recent Labs    09/15/23 1211  WBC 8.5  HGB 6.9*  HCT 21.1*  MCV 103.4*  PLT 274   Recent Labs    09/15/23 1211  NA 139  K 4.1  CL 103  CO2 24  GLUCOSE 118*  BUN 40*  CREATININE 1.47*  CALCIUM  9.8     DG Chest Port 1 View CLINICAL DATA:  Shortness of breath  EXAM: PORTABLE CHEST 1 VIEW  COMPARISON:  Chest x-ray performed May 09, 2016  FINDINGS: The heart size and mediastinal contours are within normal limits. Both lungs are clear. The visualized skeletal structures are unchanged.  IMPRESSION: No active disease.  Electronically Signed   By: Reagan Camera M.D.   On: 09/15/2023 12:33    Pertinent GI Studies    2018 EGD and colonoscopy - Juanita Norlander, MD ( Surgery)  Esophagus:   Normal GE junction  at:  37 cm Stomach:  She has evidence of mild gastritis and a fair amount of bile reflux in her stomach.   Duodenum:   Normal to the 3rd portion of the duodenum.   Colonoscopy.  A digital rectal exam was done at the beginning of the procedure..  The anus and rectum were unremarkable.  The flexible Pentax colonoscope was passed up the rectum without difficulty.  The scope was advanced to the cecum and the ileocecal valve was identified.  The colonic prep was good.   The right colon, transverse colon, left colon, and sigmoid colon were unremarkable.  I could not identify my prior  resection/anastomosis, though this should have been at the 20 cm area.                Past Medical History:  Diagnosis Date   Anxiety    Artery stenosis (HCC)    left internal carotid, left subclavian    Arthritis    Bleeding tendency (HCC) 05/16/2012   Hx post op bleeding (epidural hematoma s/p ACDF '10; LLQ hematoma due to superficial vessel fascial bleed post colon resection; Normal platelet count; saw Dr. Isidor Marek '14   CAD in native artery    a. mod to mod-severe CAD by cath 10/2016, med rx.   Carotid stenosis, bilateral 08/31/2016   1-39% right and 50-69% lefft ICA stenosis dopplers 04/2019   Chronic diastolic CHF (congestive heart failure) (HCC)    Colon polyp    Diverticulitis    Family history of cardiovascular disease    Fracture of right fibula    GERD (gastroesophageal reflux disease)    Headache(784.0)    mirgraines - history of   Hearing aid worn    B/L   HOH (hard of hearing)    wears hearing aids   Hyperlipidemia    Hypertension    IBS (irritable bowel syndrome)    followed by Dr. Andriette Keeling   Liver cyst    Melanoma (HCC)    removed from back 04/2013   PAF (paroxysmal atrial fibrillation) (HCC)    Shortness of breath dyspnea    with exertion   Stroke (HCC) 06/2012   mild   SVT (supraventricular tachycardia) (HCC)    Urinary urgency    UTI (lower urinary tract infection)    x 2 since June 2016   Wears glasses     Past Surgical History:  Procedure Laterality Date   ABDOMINAL HYSTERECTOMY     ANGIOPLASTY     stent in subclavian LEFT 2016, angioplasty of stent 2017   APPENDECTOMY     AUGMENTATION MAMMAPLASTY Bilateral    replaced silicone with saline 2004   BREAST EXCISIONAL BIOPSY Left    benign 1977   BREAST SURGERY     BIL breast augmentation   CHOLECYSTECTOMY     COLON SURGERY     Sigmoid Colectomy, returned for post op bleeding   COLONOSCOPY W/ POLYPECTOMY     COLONOSCOPY WITH PROPOFOL  N/A 05/05/2016   Procedure: COLONOSCOPY WITH PROPOFOL ;  Surgeon: Juanita Norlander, MD;  Location: [facility];  Service: General;  Laterality: N/A;   ESOPHAGOGASTRODUODENOSCOPY (EGD) WITH PROPOFOL  N/A 05/05/2016   Procedure: ESOPHAGOGASTRODUODENOSCOPY (EGD) WITH  PROPOFOL ;  Surgeon: Juanita Norlander, MD;  Location: [facility];  Service: General;  Laterality: N/A;   EYE SURGERY     eyelid drooping fixed   IR ANGIO INTRA EXTRACRAN SEL COM CAROTID INNOMINATE BILAT MOD SED  10/16/2018   IR ANGIO VERTEBRAL SEL SUBCLAVIAN INNOMINATE UNI L MOD  SED  10/16/2018   IR ANGIO VERTEBRAL SEL VERTEBRAL UNI R MOD SED  10/16/2018   IR GENERIC HISTORICAL  12/24/2015   IR ANGIO INTRA EXTRACRAN SEL COM CAROTID INNOMINATE BILAT MOD SED 12/24/2015 Luellen Sages, MD [facility]   IR GENERIC HISTORICAL  12/24/2015   IR ANGIOGRAM EXTREMITY LEFT 12/24/2015 Luellen Sages, MD [facility]   IR GENERIC HISTORICAL  12/24/2015   IR ANGIO VERTEBRAL SEL VERTEBRAL UNI R MOD SED 12/24/2015 Luellen Sages, MD [facility]   IR GENERIC HISTORICAL  01/05/2016   IR PTA NON CORO-LOWER EXTREM 01/05/2016 Luellen Sages, MD [facility]   IR GENERIC HISTORICAL  03/07/2016   IR RADIOLOGIST EVAL & MGMT 03/07/2016 [facility]   IR RADIOLOGIST EVAL & MGMT  11/30/2022   JOINT REPLACEMENT Left    thumb   LEFT HEART CATH AND CORONARY ANGIOGRAPHY N/A 11/10/2016   Procedure: Left Heart Cath and Coronary Angiography;  Surgeon: Arty Binning, MD;  Location: [facility];  Service: Cardiovascular;  Laterality: N/A;   MASTOID DEBRIDEMENT     MASTOIDECTOMY REVISION     RADIOLOGY WITH ANESTHESIA N/A 11/09/2014   Procedure: STENT PLACEMENT;  Surgeon: Luellen Sages, MD;  Location: [facility];  Service: Radiology;  Laterality: N/A;   RADIOLOGY WITH ANESTHESIA N/A 05/31/2015   Procedure: RADIOLOGY WITH ANESTHESIA;  Surgeon: Luellen Sages, MD;  Location: [facility];  Service: Radiology;  Laterality: N/A;   RADIOLOGY WITH ANESTHESIA N/A 01/05/2016   Procedure: RADIOLOGY WITH ANESTHESIA ANGIOPLASTY WITH STENTNG;  Surgeon: Luellen Sages, MD;  Location: [facility];  Service: Radiology;  Laterality: N/A;   RADIOLOGY WITH ANESTHESIA N/A 10/16/2018   Procedure: STENTING;  Surgeon: Luellen Sages,  MD;  Location: [facility];  Service: Radiology;  Laterality: N/A;   skin cancer excised  04/2013   Melonoma   SPINE SURGERY     cervical fusion, returned to OR for post op  bleeding   TONSILLECTOMY     TONSILLECTOMY     TUBAL LIGATION     VAGINA SURGERY     anterior posterior repair    Family History  Problem Relation Age of Onset   Heart disease Father    Heart disease Sister    Heart attack Sister    Heart disease Brother    Heart attack Brother    Stroke Son    Hyperlipidemia Other    Hypertension Other    Cancer Other        lung, esophagus, stomach   Stroke Other    Breast cancer Neg Hx    BRCA 1/2 Neg Hx     Prior to Admission medications   Medication Sig Start Date End Date Taking? Authorizing Provider  ALPRAZolam  (XANAX ) 0.5 MG tablet Take ONE-HALF to 1 tablets (0.25-0.5 mg total) by mouth at bedtime as needed Patient taking differently: Take 0.25-0.5 mg by mouth at bedtime as needed for anxiety. 09/06/23  Yes Burns, Beckey Bourgeois, MD  apixaban  (ELIQUIS ) 5 MG TABS tablet Take 1 tablet (5 mg total) by mouth 2 (two) times daily. 08/28/23  Yes Turner, Rufus Council, MD  ascorbic acid (VITAMIN C) 500 MG tablet Take 500 mg by mouth daily.   Yes [provider]  cetirizine (ZYRTEC) 10 MG tablet Take 10 mg by mouth every morning.   Yes [provider]  clopidogrel  (PLAVIX ) 75 MG tablet TAKE 1 TABLET BY MOUTH EVERY DAY 07/09/23  Yes Burns, Beckey Bourgeois, MD  diclofenac  (VOLTAREN ) 50 MG EC tablet Take 1 tablet (  50 mg total) by mouth daily as needed. Patient taking differently: Take 50 mg by mouth daily as needed for mild pain (pain score 1-3). 11/29/22  Yes Burns, Beckey Bourgeois, MD  diltiazem  (CARDIZEM  CD) 180 MG 24 hr capsule TAKE 1 CAPSULE (180 MG TOTAL) BY MOUTH AT BEDTIME. 01/11/23  Yes Turner, Rufus Council, MD  estradiol  (ESTRACE ) 0.1 MG/GM vaginal cream Place 1 Applicatorful vaginally 2 (two) times a week. 08/28/22  Yes Burns, Beckey Bourgeois, MD  famotidine  (PEPCID ) 40 MG tablet Take 1 tablet (40 mg  total) by mouth daily. Patient taking differently: Take 40 mg by mouth every evening. 01/11/23  Yes Burns, Beckey Bourgeois, MD  ferrous sulfate  325 (65 FE) MG tablet Take 325 mg by mouth daily with breakfast.   Yes [provider]  fluticasone  (FLONASE ) 50 MCG/ACT nasal spray Place 1 spray into both nostrils daily as needed for allergies.   Yes [provider]  furosemide  (LASIX ) 20 MG tablet TAKE 1 TABLET (20 MG TOTAL) BY MOUTH DAILY. MAY TAKE AN ADDITIONAL TABLET FOR EDEMA 01/11/23  Yes Turner, Rufus Council, MD  losartan  (COZAAR ) 100 MG tablet TAKE 1 TABLET BY MOUTH EVERY DAY 04/12/23  Yes Turner, Traci R, MD  methocarbamol  (ROBAXIN ) 500 MG tablet TAKE 1 TABLET (500 MG TOTAL) BY MOUTH DAILY AS NEEDED FOR MUSCLE SPASMS. 07/18/22  Yes Burns, Beckey Bourgeois, MD  metoprolol  succinate (TOPROL -XL) 50 MG 24 hr tablet Take 50 mg by mouth as needed (svt). Take with or immediately following a meal. -   Yes [provider]  mirabegron  ER (MYRBETRIQ ) 50 MG TB24 tablet Take 1 tablet (50 mg total) by mouth daily. Patient taking differently: Take 25 mg by mouth 2 (two) times daily. 03/14/23  Yes Burns, Beckey Bourgeois, MD  nitroGLYCERIN  (NITROSTAT ) 0.4 MG SL tablet Place 0.4 mg under the tongue every 5 (five) minutes as needed for chest pain.   Yes [provider]  Omega-3 Fatty Acids (FISH OIL PO) Take 1 tablet by mouth daily.   Yes [provider]  oxyCODONE -acetaminophen  (PERCOCET/ROXICET) 5-325 MG tablet Take 0.5 tablets by mouth every 6 (six) hours as needed for moderate pain (pain score 4-6).   Yes [provider]  potassium chloride  (KLOR-CON ) 10 MEQ tablet TAKE 1 TABLET BY MOUTH EVERY DAY 07/09/23  Yes Burns, Beckey Bourgeois, MD  rosuvastatin  (CRESTOR ) 20 MG tablet TAKE 1 TABLET BY MOUTH EVERYDAY AT BEDTIME 07/09/23  Yes Burns, Beckey Bourgeois, MD  traZODone  (DESYREL ) 100 MG tablet TAKE 1 TABLET AT BEDTIME. PER MD RETURN IN ABOUT 6 MONTHS (AROUND 11/22/2022) FOR PHYSICAL EXAM. 07/09/23  Yes Burns, Beckey Bourgeois, MD  vitamin B-12 (CYANOCOBALAMIN ) 500 MCG tablet Take 500 mcg by mouth daily.   Yes [provider]    Current Facility-Administered Medications  Medication Dose Route Frequency Provider Last Rate Last Admin   0.9 %  sodium chloride  infusion (Manually program via Guardrails IV Fluids)   Intravenous Once Arne Langdon, MD       0.9 %  sodium chloride  infusion   Intravenous Continuous Arne Langdon, MD   Stopped at 09/15/23 2251   ALPRAZolam  (XANAX ) tablet 0.25 mg  0.25 mg Oral QHS PRN Moore, Willie, MD   0.25 mg at 09/15/23 2232   diltiazem  (CARDIZEM  CD) 24 hr capsule 180 mg  180 mg Oral QHS Arne Langdon, MD   180 mg at 09/15/23 2226   famotidine  (PEPCID ) tablet 40 mg  40 mg Oral Daily Moore, Willie, MD   40 mg  at 09/15/23 1933   metoprolol  succinate (TOPROL -XL) 24 hr tablet 50 mg  50 mg Oral PRN Arne Langdon, MD       pantoprazole  (PROTONIX ) injection 40 mg  40 mg Intravenous Q12H Arne Langdon, MD   40 mg at 09/15/23 1812   rosuvastatin  (CRESTOR ) tablet 10 mg  10 mg Oral Daily Arne Langdon, MD   10 mg at 09/15/23 1933   traZODone  (DESYREL ) tablet 100 mg  100 mg Oral QHS Arne Langdon, MD   100 mg at 09/15/23 2226    Allergies as of 09/15/2023 - Review Complete 09/15/2023  Allergen Reaction Noted   Phenergan  [promethazine  hcl] Other (See Comments) 10/10/2018   Daypro [oxaprozin] Swelling 09/12/2011   Lisinopril   09/25/2011   Oxycodone  Itching 04/16/2020   Simvastatin  04/28/2009   Tramadol   10/29/2018    Social History   Socioeconomic History   Marital status: Divorced    Spouse name: Not on file   Number of children: 2   Years of education: 14   Highest education level: Associate degree: occupational, Scientist, product/process development, or vocational program  Occupational History   Occupation: Academic librarian: [employer]    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[employer], retired  Tobacco Use   Smoking status: Former    Current packs/day: 0.00    Average packs/day: 2.0 packs/day for  22.0 years (44.0 ttl pk-yrs)    Types: Cigarettes    Start date: 04/24/1963    Quit date: 04/23/1985    Years since quitting: 38.4   Smokeless tobacco: Never  Vaping Use   Vaping status: Never Used  Substance and Sexual Activity   Alcohol use: Yes    Alcohol/week: 4.0 standard drinks of alcohol    Types: 4 Glasses of wine per week    Comment: 1 drink a  day   Drug use: No   Sexual activity: Not Currently    Birth control/protection: Surgical  Other Topics Concern   Not on file  Social History Narrative   ** Merged History Encounter **       Domestic partner   Regular exercise-no   Right handed   Caffeine use-- 2 cups coffee daily, rare soda   Social Drivers of Corporate investment banker Strain: Low Risk  (05/20/2023)   Overall Financial Resource Strain (CARDIA)    Difficulty of Paying Living Expenses: Not hard at all  Food Insecurity: No Food Insecurity (09/15/2023)   Hunger Vital Sign    Worried About Running Out of Food in the Last Year: Never true    Ran Out of Food in the Last Year: Never true  Transportation Needs: No Transportation Needs (09/15/2023)   PRAPARE - Administrator, Civil Service (Medical): No    Lack of Transportation (Non-Medical): No  Physical Activity: Insufficiently Active (05/20/2023)   Exercise Vital Sign    Days of Exercise per Week: 2 days    Minutes of Exercise per Session: 30 min  Stress: Stress Concern Present (05/20/2023)   [employer] of Occupational Health - Occupational Stress Questionnaire    Feeling of Stress : To some extent  Social Connections: Moderately Isolated (09/15/2023)   Social Connection and Isolation Panel [NHANES]    Frequency of Communication with Friends and Family: More than three times a week    Frequency of Social Gatherings with Friends and Family: More than three times a week    Attends Religious Services: 1 to 4 times per year  Active Member of Clubs or Organizations: No    Attends Banker  Meetings: Never    Marital Status: Divorced  Catering manager Violence: Not At Risk (09/15/2023)   Humiliation, Afraid, Rape, and Kick questionnaire    Fear of Current or Ex-Partner: No    Emotionally Abused: No    Physically Abused: No    Sexually Abused: No     Code Status   Code Status: Full Code  Review of Systems: All systems reviewed and negative except where noted in HPI.  Physical Exam: Vital signs in last 24 hours: Temp:  [87.8 F (31 C)-98.8 F (37.1 C)] 98 F (36.7 C) (06/01 0801) Pulse Rate:  [58-77] 66 (06/01 0801) Resp:  [14-20] 19 (06/01 0801) BP: (101-143)/(32-65) 119/44 (06/01 0801) SpO2:  [94 %-100 %] 99 % (06/01 0801) Weight:  [64.9 kg-67 kg] 67 kg (05/31 1800) Last BM Date : 09/15/23  General:  Pleasant female in NAD Psych:  Cooperative. Normal mood and affect Eyes: Pupils equal Ears:  Normal auditory acuity Nose: No deformity, discharge or lesions Neck:  Supple, no masses felt Lungs:  Clear to auscultation.  Heart:  Regular rate, regular rhythm.  Abdomen:  Soft, nondistended, nontender, active bowel sounds, no masses felt Rectal :  Deferred Msk: Symmetrical without gross deformities.  Neurologic:  Alert, oriented, grossly normal neurologically Extremities : No edema Skin:  Intact without significant lesions.    Intake/Output from previous day: 05/31 0701 - 06/01 0700 In: 747.1 [I.V.:341.1; Blood:406] Out: -  Intake/Output this shift:  Total I/O In: 305 [Blood:305] Out: -    Mai Schwalbe, NP-C   09/16/2023, 8:14 AM  I have taken an interval history, thoroughly reviewed the chart and examined the patient. I agree with the Advanced Practitioner's note, impression and recommendations, and have recorded additional findings, impressions and recommendations below. I performed a substantive portion of this encounter (>50% time spent), including a complete performance of the medical decision making.  My additional thoughts are as  follows:  78 year old woman with paroxysmal A-fib, CAD, subclavian steal on Plavix  and Eliquis  here with acute GI bleeding and severe acute blood loss anemia.  Suspect upper source, probably ulcer less likely Dieulafoy's. Hemodynamically stable. It took some calls and messages to facilitate a stat CBC this morning, but it was done and came back with a hemoglobin of 9.8 and persistently normal platelets.    Upper endoscopy today with possible endoscopic hemostasis therapy if needed. Agreeable after discussion of procedure and risks.  The benefits and risks of the planned procedure(s) were described in detail with the patient or (when appropriate) their health care proxy.  Risks were outlined as including, but not limited to, bleeding, infection, perforation, adverse medication reaction leading to cardiac or pulmonary decompensation, pancreatitis (if ERCP).  The limitation of incomplete mucosal visualization was also discussed.  No guarantees or warranties were given.  Patient at increased risk for cardiopulmonary complications of procedure due to medical comorbidities.    Kerby Pearson III Office:[phone]

## 2023-09-16 NOTE — Progress Notes (Signed)
 Patient is back after the procedure at 1400pm. Will continue to monitor

## 2023-09-16 NOTE — Transfer of Care (Signed)
 Immediate Anesthesia Transfer of Care Note  Patient: Susan Gillespie  Procedure(s) Performed: EGD (ESOPHAGOGASTRODUODENOSCOPY) BIOPSY, GI  Patient Location: PACU  Anesthesia Type:MAC  Level of Consciousness: drowsy  Airway & Oxygen Therapy: Patient Spontanous Breathing  Post-op Assessment: Report given to RN and Post -op Vital signs reviewed and stable  Post vital signs: Reviewed and stable  Last Vitals:  Vitals Value Taken Time  BP 124/42 09/16/23 1319  Temp 98   Pulse 66 09/16/23 1330  Resp 16 09/16/23 1330  SpO2 98 % 09/16/23 1330  Vitals shown include unfiled device data.  Last Pain:  Vitals:   09/16/23 1319  TempSrc:   PainSc: 0-No pain         Complications: No notable events documented.

## 2023-09-16 NOTE — Interval H&P Note (Signed)
 History and Physical Interval Note:  09/16/2023 12:45 PM  Susan Gillespie  has presented today for surgery, with the diagnosis of gastrointestinal bleeding ( melena).  The various methods of treatment have been discussed with the patient and family. After consideration of risks, benefits and other options for treatment, the patient has consented to  Procedure(s): EGD (ESOPHAGOGASTRODUODENOSCOPY) (N/A) as a surgical intervention.  The patient's history has been reviewed, patient examined, no change in status, stable for surgery.  I have reviewed the patient's chart and labs.  Questions were answered to the patient's satisfaction.     Kerby Pearson III

## 2023-09-16 NOTE — Anesthesia Preprocedure Evaluation (Addendum)
 Anesthesia Evaluation  Patient identified by MRN, date of birth, ID band Patient awake    Reviewed: Allergy & Precautions, NPO status , Patient's Chart, lab work & pertinent test results  Airway Mallampati: I  TM Distance: >3 FB Neck ROM: Full    Dental  (+) Teeth Intact, Dental Advisory Given   Pulmonary former smoker   breath sounds clear to auscultation       Cardiovascular hypertension, + CAD, + Peripheral Vascular Disease and +CHF   Rhythm:Regular Rate:Normal     Neuro/Psych  Headaches  Anxiety      Neuromuscular disease CVA    GI/Hepatic Neg liver ROS,GERD  ,,  Endo/Other  negative endocrine ROS    Renal/GU negative Renal ROS     Musculoskeletal  (+) Arthritis ,    Abdominal   Peds  Hematology  (+) Blood dyscrasia, anemia   Anesthesia Other Findings   Reproductive/Obstetrics                             Anesthesia Physical Anesthesia Plan  ASA: 3  Anesthesia Plan: MAC   Post-op Pain Management: Minimal or no pain anticipated   Induction: Intravenous  PONV Risk Score and Plan: 0 and Propofol  infusion  Airway Management Planned: Natural Airway and Nasal Cannula  Additional Equipment: None  Intra-op Plan:   Post-operative Plan:   Informed Consent: I have reviewed the patients History and Physical, chart, labs and discussed the procedure including the risks, benefits and alternatives for the proposed anesthesia with the patient or authorized representative who has indicated his/her understanding and acceptance.       Plan Discussed with: CRNA  Anesthesia Plan Comments:        Anesthesia Quick Evaluation

## 2023-09-16 NOTE — Anesthesia Postprocedure Evaluation (Signed)
 Anesthesia Post Note  Patient: [name]  Procedure(s) Performed: EGD (ESOPHAGOGASTRODUODENOSCOPY) BIOPSY, GI     Patient location during evaluation: PACU Anesthesia Type: MAC Level of consciousness: awake and alert Pain management: pain level controlled Vital Signs Assessment: post-procedure vital signs reviewed and stable Respiratory status: spontaneous breathing, nonlabored ventilation, respiratory function stable and patient connected to nasal cannula oxygen Cardiovascular status: stable and blood pressure returned to baseline Postop Assessment: no apparent nausea or vomiting Anesthetic complications: no  No notable events documented.  Last Vitals:  Vitals:   09/16/23 1319 09/16/23 1330  BP: (!) 124/42 (!) 115/40  Pulse: 72 66  Resp: 16 16  Temp: (!) 36.1 C   SpO2: 95% 98%    Last Pain:  Vitals:   09/16/23 1330  TempSrc:   PainSc: 1                  [name]

## 2023-09-17 ENCOUNTER — Encounter: Payer: Self-pay | Admitting: Cardiology

## 2023-09-17 ENCOUNTER — Inpatient Hospital Stay (HOSPITAL_COMMUNITY): Admitting: Anesthesiology

## 2023-09-17 ENCOUNTER — Encounter (HOSPITAL_COMMUNITY)
Admission: EM | Disposition: A | Discharge status: Home / Self Care | Payer: Self-pay | Source: Home / Self Care | Attending: Internal Medicine

## 2023-09-17 DIAGNOSIS — K921 Melena: Secondary | ICD-10-CM

## 2023-09-17 DIAGNOSIS — K635 Polyp of colon: Secondary | ICD-10-CM

## 2023-09-17 DIAGNOSIS — D62 Acute posthemorrhagic anemia: Principal | ICD-10-CM

## 2023-09-17 DIAGNOSIS — K648 Other hemorrhoids: Secondary | ICD-10-CM

## 2023-09-17 DIAGNOSIS — D124 Benign neoplasm of descending colon: Secondary | ICD-10-CM

## 2023-09-17 DIAGNOSIS — K5730 Diverticulosis of large intestine without perforation or abscess without bleeding: Secondary | ICD-10-CM | POA: Diagnosis not present

## 2023-09-17 DIAGNOSIS — K621 Rectal polyp: Secondary | ICD-10-CM | POA: Diagnosis not present

## 2023-09-17 DIAGNOSIS — K922 Gastrointestinal hemorrhage, unspecified: Secondary | ICD-10-CM | POA: Diagnosis not present

## 2023-09-17 DIAGNOSIS — K5790 Diverticulosis of intestine, part unspecified, without perforation or abscess without bleeding: Secondary | ICD-10-CM

## 2023-09-17 DIAGNOSIS — D128 Benign neoplasm of rectum: Secondary | ICD-10-CM | POA: Diagnosis not present

## 2023-09-17 DIAGNOSIS — D123 Benign neoplasm of transverse colon: Secondary | ICD-10-CM

## 2023-09-17 LAB — BASIC METABOLIC PANEL WITH GFR
Anion gap: 8 (ref 5–15)
BUN: 13 mg/dL (ref 8–23)
CO2: 25 mmol/L (ref 22–32)
Calcium: 9 mg/dL (ref 8.9–10.3)
Chloride: 107 mmol/L (ref 98–111)
Creatinine, Ser: 1.15 mg/dL — ABNORMAL HIGH (ref 0.44–1.00)
GFR, Estimated: 48 mL/min — ABNORMAL LOW (ref >60)
Glucose, Bld: 117 mg/dL — ABNORMAL HIGH (ref 70–99)
Potassium: 3.4 mmol/L — ABNORMAL LOW (ref 3.5–5.1)
Sodium: 140 mmol/L (ref 135–145)

## 2023-09-17 LAB — TYPE AND SCREEN
ABO/RH(D): A NEG
Antibody Screen: NEGATIVE
Unit division: 0
Unit division: 0

## 2023-09-17 LAB — BPAM RBC
Blood Product Expiration Date: 202506162359
ISSUE DATE / TIME: 202505312349
ISSUE DATE / TIME: 202506010421
ISSUING PHYSICIAN: 202506010421
Unit Type and Rh: 202506162359
Unit Type and Rh: 600
Unit Type and Rh: 600

## 2023-09-17 LAB — HEMOGLOBIN AND HEMATOCRIT, BLOOD
HCT: 27.6 % — ABNORMAL LOW (ref 36.0–46.0)
HCT: 28 % — ABNORMAL LOW (ref 36.0–46.0)
HCT: 25.6 % — ABNORMAL LOW (ref 36.0–46.0)
Hemoglobin: 9.4 g/dL — ABNORMAL LOW (ref 12.0–15.0)
Hemoglobin: 9.5 g/dL — ABNORMAL LOW (ref 12.0–15.0)
Hemoglobin: 8.5 g/dL — ABNORMAL LOW (ref 12.0–15.0)

## 2023-09-17 SURGERY — COLONOSCOPY
Anesthesia: Monitor Anesthesia Care

## 2023-09-17 MED ORDER — POTASSIUM CHLORIDE CRYS ER 20 MEQ PO TBCR
40.0000 meq | EXTENDED_RELEASE_TABLET | Freq: Once | ORAL | Status: AC
  Administered 2023-09-17: 40 meq via ORAL
  Filled 2023-09-17: qty 2

## 2023-09-17 MED ORDER — CLOPIDOGREL BISULFATE 75 MG PO TABS
37.5000 mg | ORAL_TABLET | Freq: Every day | ORAL | Status: DC
  Administered 2023-09-17: 37.5 mg via ORAL
  Filled 2023-09-17: qty 1

## 2023-09-17 MED ORDER — PROPOFOL 500 MG/50ML IV EMUL
INTRAVENOUS | Status: DC | PRN
  Administered 2023-09-17: 100 ug/kg/min via INTRAVENOUS

## 2023-09-17 MED ORDER — PROPOFOL 10 MG/ML IV BOLUS
INTRAVENOUS | Status: DC | PRN
Start: 2023-09-17 — End: 2023-09-17
  Administered 2023-09-17 (×2): 40 mg via INTRAVENOUS

## 2023-09-17 MED ORDER — APIXABAN 5 MG PO TABS
5.0000 mg | ORAL_TABLET | Freq: Two times a day (BID) | ORAL | Status: DC
  Administered 2023-09-17 (×2): 5 mg via ORAL
  Filled 2023-09-17 (×2): qty 1

## 2023-09-17 MED ORDER — METHOCARBAMOL 500 MG PO TABS
500.0000 mg | ORAL_TABLET | Freq: Every day | ORAL | Status: DC | PRN
  Administered 2023-09-17: 500 mg via ORAL
  Filled 2023-09-17: qty 1

## 2023-09-17 MED ORDER — LACTATED RINGERS IV SOLN: INTRAVENOUS | Status: DC | PRN

## 2023-09-17 MED ORDER — LIDOCAINE 2% (20 MG/ML) 5 ML SYRINGE
INTRAMUSCULAR | Status: DC | PRN
  Administered 2023-09-17: 80 mg via INTRAVENOUS

## 2023-09-17 MED ORDER — LACTATED RINGERS IV SOLN: INTRAVENOUS | Status: DC

## 2023-09-17 NOTE — Op Note (Addendum)
 [facility] Patient Name: Susan Gillespie Procedure Date : 09/17/2023 MRN: [id number] Attending MD: Eugenia Hess , MD, [id number] Date of Birth: [birth year] CSN: [id number] Age: 78 Admit Type: Inpatient Procedure:                Colonoscopy Indications:              Last colonoscopy: 2018, Melena, Acute post                            hemorrhagic anemia, History of diverticulitis                            complicated by abscess status post sigmoidectomy                            more than 10 years ago Providers:                Eugenia Hess, MD, Perla Bradford, RN, Gabino Joe, Technician Referring MD:              Medicines:                Monitored Anesthesia Care Complications:            No immediate complications. Estimated blood loss:                            None. Estimated Blood Loss:     Estimated blood loss: none. Procedure:                Pre-Anesthesia Assessment:                           - Prior to the procedure, a History and Physical                            was performed, and patient medications and                            allergies were reviewed. The patient's tolerance of                            previous anesthesia was also reviewed. The risks                            and benefits of the procedure and the sedation                            options and risks were discussed with the patient.                            All questions were answered, and informed consent                            was obtained. Prior Anticoagulants: The patient  has                            taken Eliquis  (apixaban ), last dose was 3 days                            prior to procedure. ASA Grade Assessment: III - A                            patient with severe systemic disease. After                            reviewing the risks and benefits, the patient was                            deemed in satisfactory condition to undergo the                             procedure.                           After obtaining informed consent, the colonoscope                            was passed under direct vision. Throughout the                            procedure, the patient's blood pressure, pulse, and                            oxygen saturations were monitored continuously. The                            [id number]-HQ190TL ([id number]) Olympus peds colonoscope was                            introduced through the anus and advanced to the                            terminal ileum. The colonoscopy was performed                            without difficulty. The patient tolerated the                            procedure well. The quality of the bowel                            preparation was good. The ileocecal valve,                            appendiceal orifice, and rectum were photographed. Scope In: 9:23:13 AM Scope Out: 9:40:17 AM Scope Withdrawal Time: 0 hours 13 minutes 17 seconds  Total Procedure Duration: 0 hours 17 minutes 4 seconds  Findings:  The perianal and digital rectal examinations were normal. Pertinent       negatives include normal sphincter tone and no palpable rectal lesions.      Multiple small-mouthed diverticula were found in the descending colon,       transverse colon and ascending colon.      Three sessile, non-bleeding polyps were found in the rectum, descending       colon and transverse colon. The polyps were 5 to 7 mm in size. Polyps       were not removed in the setting of recent GI bleeding and incomplete       Plavix  washout.      The terminal ileum appeared normal.      Internal hemorrhoids were found during retroflexion.      A small amount of scattered hematin was noted in the right colon and       easily lavaged. There was no evidence of active bleeding in the colon or       terminal ileum at the time of the patient's procedure. Impression:               - Diverticulosis in the descending  colon, in the                            transverse colon and in the ascending colon.                            Right-sided diverticular bleeding may explain the                            patient's recent melena and acute blood loss anemia.                           - Three 5 to 7 mm, non-bleeding polyps in the                            rectum, in the descending colon and in the                            transverse colon. Polyps were not removed in the                            setting of recent GI bleeding and incomplete Plavix                             washout.                           - The examined portion of the ileum was normal.                           - Internal hemorrhoids.                           - No specimens collected. Recommendation:           - Return patient to hospital ward for ongoing care.                           -  Continue to monitor stools and hemoglobin                           - If patient manifests recurrent anemia and melena                            can consider video capsule endoscopy for further                            evaluation of a small bowel bleeding source                           - In the absence of active GI bleeding may advance                            diet                           - Patient will need a follow-up colonoscopy in the                            outpatient setting after 5-day Plavix  washout for                            colon polyp removal                           - The findings and recommendations were discussed                            with the patient. Procedure Code(s):        --- Professional ---                           [id number], Colonoscopy, flexible; diagnostic, including                            collection of specimen(s) by brushing or washing,                            when performed (separate procedure) Diagnosis Code(s):        --- Professional ---                           K64.8, Other hemorrhoids                            D12.8, Benign neoplasm of rectum                           D12.4, Benign neoplasm of descending colon                           D12.3, Benign neoplasm of transverse colon (hepatic  flexure or splenic flexure)                           K92.1, Melena (includes Hematochezia)                           D62, Acute posthemorrhagic anemia                           K57.30, Diverticulosis of large intestine without                            perforation or abscess without bleeding CPT copyright 2022 [employer]. All rights reserved. The codes documented in this report are preliminary and upon coder review may  be revised to meet current compliance requirements. Eugenia Hess, MD 09/17/2023 9:51:12 AM This report has been signed electronically. Number of Addenda: 1 Addendum Number: 1   Addendum Date: 09/17/2023 10:05:24 AM      Patient appears stable to resume anticoagulation in the absence of       ongoing bleeding detected on EGD or colonoscopy. Patient queries if she       needs to remain on Eliquis  in conjunction with Plavix . Discussed that I       would defer that to her hospitalist team/cardiologist for further       decision making. I think patient can resume what ever form of       anticoagulation is deemed appropriate today. Eugenia Hess, MD 09/17/2023 10:06:20 AM This report has been signed electronically.

## 2023-09-17 NOTE — Transfer of Care (Signed)
 Immediate Anesthesia Transfer of Care Note  Patient: Susan Gillespie  Procedure(s) Performed: COLONOSCOPY  Patient Location: Endoscopy Unit   Anesthesia Type:MAC  Level of Consciousness: awake, alert , and oriented  Airway & Oxygen Therapy: Patient Spontanous Breathing and Patient connected to nasal cannula oxygen  Post-op Assessment: Report given to RN and Post -op Vital signs reviewed and stable  Post vital signs: Reviewed and stable  Last Vitals:  Vitals Value Taken Time  BP 123/47 09/17/23 0947  Temp 97   Pulse 88 09/17/23 0949  Resp 16 09/17/23 0949  SpO2 96 % 09/17/23 0949  Vitals shown include unfiled device data.  Last Pain:  Vitals:   09/17/23 0848  TempSrc: Temporal  PainSc: 0-No pain         Complications: No notable events documented.

## 2023-09-17 NOTE — Anesthesia Preprocedure Evaluation (Signed)
 Anesthesia Evaluation  Patient identified by MRN, date of birth, ID band Patient awake    Reviewed: Allergy & Precautions, H&P , NPO status , Patient's Chart, lab work & pertinent test results  Airway Mallampati: II   Neck ROM: full    Dental   Pulmonary former smoker   breath sounds clear to auscultation       Cardiovascular hypertension, + Peripheral Vascular Disease  + dysrhythmias Atrial Fibrillation  Rhythm:regular Rate:Normal     Neuro/Psych  Headaches  Anxiety     CVA    GI/Hepatic ,GERD  ,,  Endo/Other    Renal/GU      Musculoskeletal  (+) Arthritis ,    Abdominal   Peds  Hematology  (+) Blood dyscrasia, anemia Hemoglobin 9.4   Anesthesia Other Findings   Reproductive/Obstetrics                             Anesthesia Physical Anesthesia Plan  ASA: 3  Anesthesia Plan: MAC   Post-op Pain Management:    Induction: Intravenous  PONV Risk Score and Plan: 2 and Propofol  infusion and Treatment may vary due to age or medical condition  Airway Management Planned: Nasal Cannula  Additional Equipment:   Intra-op Plan:   Post-operative Plan:   Informed Consent: I have reviewed the patients History and Physical, chart, labs and discussed the procedure including the risks, benefits and alternatives for the proposed anesthesia with the patient or authorized representative who has indicated his/her understanding and acceptance.     Dental advisory given  Plan Discussed with: CRNA, Anesthesiologist and Surgeon  Anesthesia Plan Comments:        Anesthesia Quick Evaluation

## 2023-09-17 NOTE — H&P (Signed)
 [facility] History and Physical   Primary Care Physician:  Colene Dauphin, MD   Reason for Procedure:  Melena, acute blood loss anemia, no source of bleeding on EGD  Plan:    Colonoscopy     HPI: Susan Gillespie is a 78 y.o. female undergoing colonoscopy for investigation of melena, acute blood loss anemia.  Patient was admitted to the hospital 09/15/2023 with shortness of breath and dark stool.  Denied nausea, vomiting or abdominal pain.  Hemoglobin 6.9.  Underwent EGD 09/16/2023 without source of bleeding identified.  Referred for colonoscopy today.  Reports that her last colonoscopy was performed in 2018 and unremarkable.  Has a history of diverticulitis complicated by abscess status post sigmoidectomy more than 10 years ago.  On Plavix  and Eliquis  at home on 09/04/23.   Past Medical History:  Diagnosis Date   Anxiety    Artery stenosis (HCC)    left internal carotid, left subclavian   Arthritis    Bleeding tendency (HCC) 05/16/2012   Hx post op bleeding (epidural hematoma s/p ACDF '10; LLQ hematoma due to superficial vessel fascial bleed post colon resection; Normal platelet count; saw Dr. Isidor Marek '14   CAD in native artery    a. mod to mod-severe CAD by cath 10/2016, med rx.   Carotid stenosis, bilateral 08/31/2016   1-39% right and 50-69% lefft ICA stenosis dopplers 04/2019   Chronic diastolic CHF (congestive heart failure) (HCC)    Colon polyp    Diverticulitis    Family history of cardiovascular disease    Fracture of right fibula    GERD (gastroesophageal reflux disease)    Headache(784.0)    mirgraines - history of   Hearing aid worn    B/L   HOH (hard of hearing)    wears hearing aids   Hyperlipidemia    Hypertension    IBS (irritable bowel syndrome)    followed by Dr. Andriette Keeling   Liver cyst    Melanoma (HCC)    removed from back 04/2013   PAF (paroxysmal atrial fibrillation) (HCC)    Shortness of breath dyspnea    with exertion   Stroke (HCC)  06/2012   mild   SVT (supraventricular tachycardia) (HCC)    Urinary urgency    UTI (lower urinary tract infection)    x 2 since June 2016   Wears glasses     Past Surgical History:  Procedure Laterality Date   ABDOMINAL HYSTERECTOMY     ANGIOPLASTY     stent in subclavian LEFT 2016, angioplasty of stent 2017   APPENDECTOMY     AUGMENTATION MAMMAPLASTY Bilateral    replaced silicone with saline 2004   BREAST EXCISIONAL BIOPSY Left    benign 1977   BREAST SURGERY     BIL breast augmentation   CHOLECYSTECTOMY     COLON SURGERY     Sigmoid Colectomy, returned for post op bleeding   COLONOSCOPY W/ POLYPECTOMY     COLONOSCOPY WITH PROPOFOL  N/A 05/05/2016   Procedure: COLONOSCOPY WITH PROPOFOL ;  Surgeon: Juanita Norlander, MD;  Location: [facility];  Service: General;  Laterality: N/A;   ESOPHAGOGASTRODUODENOSCOPY (EGD) WITH PROPOFOL  N/A 05/05/2016   Procedure: ESOPHAGOGASTRODUODENOSCOPY (EGD) WITH PROPOFOL ;  Surgeon: Juanita Norlander, MD;  Location: [facility];  Service: General;  Laterality: N/A;   EYE SURGERY     eyelid drooping fixed   IR ANGIO INTRA EXTRACRAN SEL COM CAROTID INNOMINATE BILAT MOD SED  10/16/2018   IR ANGIO VERTEBRAL SEL SUBCLAVIAN INNOMINATE UNI L  MOD SED  10/16/2018   IR ANGIO VERTEBRAL SEL VERTEBRAL UNI R MOD SED  10/16/2018   IR GENERIC HISTORICAL  12/24/2015   IR ANGIO INTRA EXTRACRAN SEL COM CAROTID INNOMINATE BILAT MOD SED 12/24/2015 Luellen Sages, MD [facility]   IR GENERIC HISTORICAL  12/24/2015   IR ANGIOGRAM EXTREMITY LEFT 12/24/2015 Luellen Sages, MD [facility]   IR GENERIC HISTORICAL  12/24/2015   IR ANGIO VERTEBRAL SEL VERTEBRAL UNI R MOD SED 12/24/2015 Luellen Sages, MD [facility]   IR GENERIC HISTORICAL  01/05/2016   IR PTA NON CORO-LOWER EXTREM 01/05/2016 Luellen Sages, MD [facility]   IR GENERIC HISTORICAL  03/07/2016   IR RADIOLOGIST EVAL & MGMT 03/07/2016 [facility]   IR RADIOLOGIST EVAL & MGMT  11/30/2022   JOINT  REPLACEMENT Left    thumb   LEFT HEART CATH AND CORONARY ANGIOGRAPHY N/A 11/10/2016   Procedure: Left Heart Cath and Coronary Angiography;  Surgeon: Arty Binning, MD;  Location: [facility];  Service: Cardiovascular;  Laterality: N/A;   MASTOID DEBRIDEMENT     MASTOIDECTOMY REVISION     RADIOLOGY WITH ANESTHESIA N/A 11/09/2014   Procedure: STENT PLACEMENT;  Surgeon: Luellen Sages, MD;  Location: [facility];  Service: Radiology;  Laterality: N/A;   RADIOLOGY WITH ANESTHESIA N/A 05/31/2015   Procedure: RADIOLOGY WITH ANESTHESIA;  Surgeon: Luellen Sages, MD;  Location: [facility];  Service: Radiology;  Laterality: N/A;   RADIOLOGY WITH ANESTHESIA N/A 01/05/2016   Procedure: RADIOLOGY WITH ANESTHESIA ANGIOPLASTY WITH STENTNG;  Surgeon: Luellen Sages, MD;  Location: [facility];  Service: Radiology;  Laterality: N/A;   RADIOLOGY WITH ANESTHESIA N/A 10/16/2018   Procedure: STENTING;  Surgeon: Luellen Sages, MD;  Location: [facility];  Service: Radiology;  Laterality: N/A;   skin cancer excised  04/2013   Melonoma   SPINE SURGERY     cervical fusion, returned to OR for post op  bleeding   TONSILLECTOMY     TONSILLECTOMY     TUBAL LIGATION     VAGINA SURGERY     anterior posterior repair    Prior to Admission medications   Medication Sig Start Date End Date Taking? Authorizing Provider  ALPRAZolam  (XANAX ) 0.5 MG tablet Take ONE-HALF to 1 tablets (0.25-0.5 mg total) by mouth at bedtime as needed Patient taking differently: Take 0.25-0.5 mg by mouth at bedtime as needed for anxiety. 09/06/23  Yes Burns, Beckey Bourgeois, MD  apixaban  (ELIQUIS ) 5 MG TABS tablet Take 1 tablet (5 mg total) by mouth 2 (two) times daily. 08/28/23  Yes Turner, Rufus Council, MD  ascorbic acid (VITAMIN C) 500 MG tablet Take 500 mg by mouth daily.   Yes [provider]  cetirizine (ZYRTEC) 10 MG tablet Take 10 mg by mouth every morning.   Yes [provider]  clopidogrel  (PLAVIX ) 75 MG tablet TAKE 1 TABLET BY  MOUTH EVERY DAY 07/09/23  Yes Burns, Beckey Bourgeois, MD  diclofenac  (VOLTAREN ) 50 MG EC tablet Take 1 tablet (50 mg total) by mouth daily as needed. Patient taking differently: Take 50 mg by mouth daily as needed for mild pain (pain score 1-3). 11/29/22  Yes Burns, Beckey Bourgeois, MD  diltiazem  (CARDIZEM  CD) 180 MG 24 hr capsule TAKE 1 CAPSULE (180 MG TOTAL) BY MOUTH AT BEDTIME. 01/11/23  Yes Turner, Rufus Council, MD  estradiol  (ESTRACE ) 0.1 MG/GM vaginal cream Place 1 Applicatorful vaginally 2 (two) times a week. 08/28/22  Yes Burns, Beckey Bourgeois, MD  famotidine  (PEPCID ) 40 MG tablet  Take 1 tablet (40 mg total) by mouth daily. Patient taking differently: Take 40 mg by mouth every evening. 01/11/23  Yes Burns, Beckey Bourgeois, MD  ferrous sulfate  325 (65 FE) MG tablet Take 325 mg by mouth daily with breakfast.   Yes [provider]  fluticasone  (FLONASE ) 50 MCG/ACT nasal spray Place 1 spray into both nostrils daily as needed for allergies.   Yes [provider]  furosemide  (LASIX ) 20 MG tablet TAKE 1 TABLET (20 MG TOTAL) BY MOUTH DAILY. MAY TAKE AN ADDITIONAL TABLET FOR EDEMA 01/11/23  Yes Turner, Traci R, MD  losartan  (COZAAR ) 100 MG tablet TAKE 1 TABLET BY MOUTH EVERY DAY 04/12/23  Yes Turner, Traci R, MD  methocarbamol  (ROBAXIN ) 500 MG tablet TAKE 1 TABLET (500 MG TOTAL) BY MOUTH DAILY AS NEEDED FOR MUSCLE SPASMS. 07/18/22  Yes Burns, Beckey Bourgeois, MD  metoprolol  succinate (TOPROL -XL) 50 MG 24 hr tablet Take 50 mg by mouth as needed (svt). Take with or immediately following a meal. -   Yes [provider]  mirabegron  ER (MYRBETRIQ ) 50 MG TB24 tablet Take 1 tablet (50 mg total) by mouth daily. Patient taking differently: Take 25 mg by mouth 2 (two) times daily. 03/14/23  Yes Burns, Beckey Bourgeois, MD  nitroGLYCERIN  (NITROSTAT ) 0.4 MG SL tablet Place 0.4 mg under the tongue every 5 (five) minutes as needed for chest pain.   Yes [provider]  Omega-3 Fatty Acids (FISH OIL PO) Take 1 tablet by mouth daily.   Yes  [provider]  oxyCODONE -acetaminophen  (PERCOCET/ROXICET) 5-325 MG tablet Take 0.5 tablets by mouth every 6 (six) hours as needed for moderate pain (pain score 4-6).   Yes [provider]  potassium chloride  (KLOR-CON ) 10 MEQ tablet TAKE 1 TABLET BY MOUTH EVERY DAY 07/09/23  Yes Burns, Beckey Bourgeois, MD  rosuvastatin  (CRESTOR ) 20 MG tablet TAKE 1 TABLET BY MOUTH EVERYDAY AT BEDTIME 07/09/23  Yes Burns, Beckey Bourgeois, MD  traZODone  (DESYREL ) 100 MG tablet TAKE 1 TABLET AT BEDTIME. PER MD RETURN IN ABOUT 6 MONTHS (AROUND 11/22/2022) FOR PHYSICAL EXAM. 07/09/23  Yes Burns, Beckey Bourgeois, MD  vitamin B-12 (CYANOCOBALAMIN ) 500 MCG tablet Take 500 mcg by mouth daily.   Yes [provider]    Current Facility-Administered Medications  Medication Dose Route Frequency Provider Last Rate Last Admin   [MAR Hold] 0.9 %  sodium chloride  infusion (Manually program via Guardrails IV Fluids)   Intravenous Once Danis, Henry L III, MD       [facility] Hold] ALPRAZolam  (XANAX ) tablet 0.25 mg  0.25 mg Oral QHS PRN Kerby Pearson III, MD   0.25 mg at 09/16/23 2200   [MAR Hold] diltiazem  (CARDIZEM  CD) 24 hr capsule 180 mg  180 mg Oral QHS Kerby Pearson III, MD   180 mg at 09/16/23 2158   [facility] Hold] famotidine  (PEPCID ) tablet 40 mg  40 mg Oral Daily Kerby Pearson III, MD   40 mg at 09/15/23 1933   [MAR Hold] feeding supplement (BOOST / RESOURCE BREEZE) liquid 1 Container  1 Container Oral TID BM Kerby Pearson III, MD   1 Container at 09/16/23 1416   lactated ringers  infusion   Intravenous Continuous Regalado, Belkys A, MD       [MAR Hold] metoprolol  succinate (TOPROL -XL) 24 hr tablet 50 mg  50 mg Oral PRN Albertina Hugger, MD       [MAR Hold] ondansetron  (ZOFRAN ) injection 4 mg  4 mg Intravenous Q6H PRN Farrel Hones,  Eric, DO   4 mg at 09/16/23 2159   [facility] Hold] pantoprazole  (PROTONIX ) EC tablet 40 mg  40 mg Oral Daily Arlee Bellows, NP       [facility] Hold] rosuvastatin  (CRESTOR ) tablet 10 mg  10 mg Oral Daily Kerby Pearson III, MD   10 mg at 09/15/23 1933   [MAR Hold] traZODone  (DESYREL ) tablet 100 mg  100 mg Oral QHS Kerby Pearson III, MD   100 mg at 09/16/23 2158    Allergies as of 09/15/2023 - Review Complete 09/15/2023  Allergen Reaction Noted   Phenergan  [promethazine  hcl] Other (See Comments) 10/10/2018   Daypro [oxaprozin] Swelling 09/12/2011   Lisinopril   09/25/2011   Oxycodone  Itching 04/16/2020   Simvastatin  04/28/2009   Tramadol   10/29/2018    Family History  Problem Relation Age of Onset   Heart disease Father    Heart disease Sister    Heart attack Sister    Heart disease Brother    Heart attack Brother    Stroke Son    Hyperlipidemia Other    Hypertension Other    Cancer Other        lung, esophagus, stomach   Stroke Other    Breast cancer Neg Hx    BRCA 1/2 Neg Hx     Social History   Socioeconomic History   Marital status: Divorced    Spouse name: Not on file   Number of children: 2   Years of education: 14   Highest education level: Associate degree: occupational, Scientist, product/process development, or vocational program  Occupational History   Occupation: Academic librarian: [employer]    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[employer], retired  Tobacco Use   Smoking status: Former    Current packs/day: 0.00    Average packs/day: 2.0 packs/day for 22.0 years (44.0 ttl pk-yrs)    Types: Cigarettes    Start date: 04/24/1963    Quit date: 04/23/1985    Years since quitting: 38.4   Smokeless tobacco: Never  Vaping Use   Vaping status: Never Used  Substance and Sexual Activity   Alcohol use: Yes    Alcohol/week: 4.0 standard drinks of alcohol    Types: 4 Glasses of wine per week    Comment: 1 drink a  day   Drug use: No   Sexual activity: Not Currently    Birth control/protection: Surgical  Other Topics Concern   Not on file  Social History Narrative   ** Merged History Encounter **       Domestic partner   Regular exercise-no   Right handed   Caffeine use-- 2 cups coffee daily,  rare soda   Social Drivers of Corporate investment banker Strain: Low Risk  (05/20/2023)   Overall Financial Resource Strain (CARDIA)    Difficulty of Paying Living Expenses: Not hard at all  Food Insecurity: No Food Insecurity (09/15/2023)   Hunger Vital Sign    Worried About Running Out of Food in the Last Year: Never true    Ran Out of Food in the Last Year: Never true  Transportation Needs: No Transportation Needs (09/15/2023)   PRAPARE - Administrator, Civil Service (Medical): No    Lack of Transportation (Non-Medical): No  Physical Activity: Insufficiently Active (05/20/2023)   Exercise Vital Sign    Days of Exercise per Week: 2 days    Minutes of Exercise per Session: 30 min  Stress: Stress Concern Present (05/20/2023)  [employer] of Occupational Health - Occupational Stress Questionnaire    Feeling of Stress : To some extent  Social Connections: Moderately Isolated (09/15/2023)   Social Connection and Isolation Panel [NHANES]    Frequency of Communication with Friends and Family: More than three times a week    Frequency of Social Gatherings with Friends and Family: More than three times a week    Attends Religious Services: 1 to 4 times per year    Active Member of [employer] or Organizations: No    Attends Banker Meetings: Never    Marital Status: Divorced  Catering manager Violence: Not At Risk (09/15/2023)   Humiliation, Afraid, Rape, and Kick questionnaire    Fear of Current or Ex-Partner: No    Emotionally Abused: No    Physically Abused: No    Sexually Abused: No    Review of Systems:  All other review of systems negative except as mentioned in the HPI.  Physical Exam: Vital signs BP (!) 135/51   Pulse 67   Temp 98.3 F (36.8 C) (Temporal)   Resp 18   Ht 5\' 3"  (1.6 m)   Wt 67 kg   LMP  (LMP Unknown)   SpO2 99%   BMI 26.17 kg/m   General:   Alert,  Well-developed, well-nourished, pleasant and cooperative in NAD Lungs:  Clear  throughout to auscultation.   Heart:  Regular rate and rhythm; no murmurs, clicks, rubs,  or gallops. Abdomen:  Soft, nontender and nondistended. Normal bowel sounds.   Neuro/Psych:  Normal mood and affect. A and O x 3  Eugenia Hess, MD [facility]
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
## 2023-09-17 NOTE — Progress Notes (Signed)
 PHARMACY - ANTICOAGULATION CONSULT NOTE  Pharmacy Consult for Eliquis  Indication: atrial fibrillation  Allergies  Allergen Reactions   Phenergan  [Promethazine  Hcl] Other (See Comments)    IV suppressed breathing    Daypro [Oxaprozin] Swelling    facial   Lisinopril      cough   Oxycodone  Itching   Simvastatin     REACTION: myalgias and fatigue   Tramadol      Patient Measurements: Height: 5\' 3"  (160 cm) Weight: 67 kg (147 lb 11.3 oz) IBW/kg (Calculated) : 52.4 HEPARIN  DW (KG): 65.9  Vital Signs: Temp: 97.9 F (36.6 C) (06/02 0950) Temp Source: Temporal (06/02 0950) BP: 131/50 (06/02 1010) Pulse Rate: 85 (06/02 1010)  Labs: Recent Labs    09/15/23 1211 09/16/23 0941 09/16/23 1207 09/16/23 2011 09/17/23 0547 09/17/23 1106  HGB 6.9* 9.8*   < > 9.6* 9.4* 9.5*  HCT 21.1* 28.8*   < > 29.0* 27.6* 28.0*  PLT 274 252  --   --   --   --   CREATININE 1.47* 1.38*  --   --   --  1.15*   < > = values in this interval not displayed.    Estimated Creatinine Clearance: 36.4 mL/min (A) (by C-G formula based on SCr of 1.15 mg/dL (H)).   Medical History: Past Medical History:  Diagnosis Date   Anxiety    Artery stenosis (HCC)    left internal carotid, left subclavian   Arthritis    Bleeding tendency (HCC) 05/16/2012   Hx post op bleeding (epidural hematoma s/p ACDF '10; LLQ hematoma due to superficial vessel fascial bleed post colon resection; Normal platelet count; saw Dr. Isidor Marek '14   CAD in native artery    a. mod to mod-severe CAD by cath 10/2016, med rx.   Carotid stenosis, bilateral 08/31/2016   1-39% right and 50-69% lefft ICA stenosis dopplers 04/2019   Chronic diastolic CHF (congestive heart failure) (HCC)    Colon polyp    Diverticulitis    Family history of cardiovascular disease    Fracture of right fibula    GERD (gastroesophageal reflux disease)    Headache(784.0)    mirgraines - history of   Hearing aid worn    B/L   HOH (hard of hearing)     wears hearing aids   Hyperlipidemia    Hypertension    IBS (irritable bowel syndrome)    followed by Dr. Andriette Keeling   Liver cyst    Melanoma (HCC)    removed from back 04/2013   PAF (paroxysmal atrial fibrillation) (HCC)    Shortness of breath dyspnea    with exertion   Stroke (HCC) 06/2012   mild   SVT (supraventricular tachycardia) (HCC)    Urinary urgency    UTI (lower urinary tract infection)    x 2 since June 2016   Wears glasses      Assessment: 41 yoF with PMH of atrial fibrillation on Eliquis  presented to the hospital with suspected GIB. Now s/p colonoscopy without evidence of active bleed, pharmacy consulted to resume prior to admission Eliquis .    Plan:  Eliquis  5mg  BID Monitor for s/sx of bleeding  Angelo Kennedy Damauri Minion 09/17/2023,12:40 PM

## 2023-09-18 ENCOUNTER — Encounter (HOSPITAL_COMMUNITY): Payer: Self-pay | Admitting: Gastroenterology

## 2023-09-18 DIAGNOSIS — I480 Paroxysmal atrial fibrillation: Secondary | ICD-10-CM | POA: Diagnosis not present

## 2023-09-18 DIAGNOSIS — Z7902 Long term (current) use of antithrombotics/antiplatelets: Secondary | ICD-10-CM | POA: Diagnosis not present

## 2023-09-18 DIAGNOSIS — K922 Gastrointestinal hemorrhage, unspecified: Secondary | ICD-10-CM | POA: Diagnosis not present

## 2023-09-18 DIAGNOSIS — D62 Acute posthemorrhagic anemia: Secondary | ICD-10-CM | POA: Diagnosis not present

## 2023-09-18 DIAGNOSIS — K921 Melena: Secondary | ICD-10-CM | POA: Diagnosis not present

## 2023-09-18 LAB — CBC
HCT: 25 % — ABNORMAL LOW (ref 36.0–46.0)
HCT: 26 % — ABNORMAL LOW (ref 36.0–46.0)
Hemoglobin: 8.1 g/dL — ABNORMAL LOW (ref 12.0–15.0)
Hemoglobin: 8.4 g/dL — ABNORMAL LOW (ref 12.0–15.0)
MCH: 32.3 pg (ref 26.0–34.0)
MCH: 32.4 pg (ref 26.0–34.0)
MCHC: 32.4 g/dL (ref 30.0–36.0)
MCHC: 32.3 g/dL (ref 30.0–36.0)
MCV: 99.6 fL (ref 80.0–100.0)
MCV: 100.4 fL — ABNORMAL HIGH (ref 80.0–100.0)
Platelets: 219 10*3/uL (ref 150–400)
Platelets: 217 10*3/uL (ref 150–400)
RBC: 2.51 MIL/uL — ABNORMAL LOW (ref 3.87–5.11)
RBC: 2.59 MIL/uL — ABNORMAL LOW (ref 3.87–5.11)
RDW: 17 % — ABNORMAL HIGH (ref 11.5–15.5)
RDW: 16.7 % — ABNORMAL HIGH (ref 11.5–15.5)
WBC: 9.2 10*3/uL (ref 4.0–10.5)
WBC: 9.7 10*3/uL (ref 4.0–10.5)
nRBC: 0 % (ref 0.0–0.2)
nRBC: 0 % (ref 0.0–0.2)

## 2023-09-18 LAB — HEMOGLOBIN AND HEMATOCRIT, BLOOD
HCT: 25.1 % — ABNORMAL LOW (ref 36.0–46.0)
Hemoglobin: 8.2 g/dL — ABNORMAL LOW (ref 12.0–15.0)

## 2023-09-18 LAB — SURGICAL PATHOLOGY

## 2023-09-18 MED ORDER — APIXABAN 5 MG PO TABS
5.0000 mg | ORAL_TABLET | Freq: Two times a day (BID) | ORAL | Status: DC
  Administered 2023-09-18 – 2023-09-20 (×4): 5 mg via ORAL
  Filled 2023-09-18 (×5): qty 1

## 2023-09-18 MED ORDER — ASPIRIN 81 MG PO TBEC
81.0000 mg | DELAYED_RELEASE_TABLET | Freq: Every day | ORAL | Status: DC
  Administered 2023-09-18 – 2023-09-20 (×3): 81 mg via ORAL
  Filled 2023-09-18 (×3): qty 1

## 2023-09-18 NOTE — Anesthesia Postprocedure Evaluation (Signed)
 Anesthesia Post Note  Patient: Susan Gillespie  Procedure(s) Performed: COLONOSCOPY     Patient location during evaluation: Endoscopy Anesthesia Type: MAC Level of consciousness: awake and alert Pain management: pain level controlled Vital Signs Assessment: post-procedure vital signs reviewed and stable Respiratory status: spontaneous breathing, nonlabored ventilation, respiratory function stable and patient connected to nasal cannula oxygen Cardiovascular status: stable and blood pressure returned to baseline Postop Assessment: no apparent nausea or vomiting Anesthetic complications: no   No notable events documented.  Last Vitals:  Vitals:   09/18/23 0539 09/18/23 0802  BP: (!) 126/48 (!) 129/47  Pulse: 66 69  Resp:  18  Temp: 36.9 C 36.8 C  SpO2: 97% 98%    Last Pain:  Vitals:   09/18/23 0539  TempSrc: Oral  PainSc:                  Emad Brechtel S

## 2023-09-18 NOTE — Plan of Care (Signed)

## 2023-09-18 NOTE — Addendum Note (Signed)
 Addendum  created 09/18/23 0845 by Leon Montoya, CRNA   Flowsheet accepted, Intraprocedure Flowsheets edited

## 2023-09-18 NOTE — Care Management Important Message (Signed)
 Important Message  Patient Details  Name: Susan Gillespie MRN: [id number] Date of Birth: Oct 04, 1945   Important Message Given:  Yes - Medicare IM     Wynonia Hedges 09/18/2023, 2:44 PM
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
## 2023-09-19 ENCOUNTER — Encounter (HOSPITAL_COMMUNITY)
Admission: EM | Disposition: A | Discharge status: Home / Self Care | Payer: Self-pay | Source: Home / Self Care | Attending: Internal Medicine

## 2023-09-19 ENCOUNTER — Encounter (HOSPITAL_COMMUNITY): Payer: Self-pay

## 2023-09-19 ENCOUNTER — Ambulatory Visit (HOSPITAL_COMMUNITY): Admission: RE | Admit: 2023-09-19 | Source: Ambulatory Visit

## 2023-09-19 DIAGNOSIS — K921 Melena: Secondary | ICD-10-CM | POA: Diagnosis not present

## 2023-09-19 DIAGNOSIS — K5790 Diverticulosis of intestine, part unspecified, without perforation or abscess without bleeding: Secondary | ICD-10-CM

## 2023-09-19 DIAGNOSIS — Z7902 Long term (current) use of antithrombotics/antiplatelets: Secondary | ICD-10-CM | POA: Diagnosis not present

## 2023-09-19 DIAGNOSIS — D62 Acute posthemorrhagic anemia: Secondary | ICD-10-CM | POA: Diagnosis not present

## 2023-09-19 LAB — BASIC METABOLIC PANEL WITH GFR
Anion gap: 4 — ABNORMAL LOW (ref 5–15)
BUN: 16 mg/dL (ref 8–23)
CO2: 24 mmol/L (ref 22–32)
Calcium: 9.2 mg/dL (ref 8.9–10.3)
Chloride: 110 mmol/L (ref 98–111)
Creatinine, Ser: 1.01 mg/dL — ABNORMAL HIGH (ref 0.44–1.00)
GFR, Estimated: 57 mL/min — ABNORMAL LOW (ref >60)
Glucose, Bld: 110 mg/dL — ABNORMAL HIGH (ref 70–99)
Potassium: 4.2 mmol/L (ref 3.5–5.1)
Sodium: 138 mmol/L (ref 135–145)

## 2023-09-19 LAB — CBC
HCT: 24.5 % — ABNORMAL LOW (ref 36.0–46.0)
Hemoglobin: 7.9 g/dL — ABNORMAL LOW (ref 12.0–15.0)
MCH: 32.4 pg (ref 26.0–34.0)
MCHC: 32.2 g/dL (ref 30.0–36.0)
MCV: 100.4 fL — ABNORMAL HIGH (ref 80.0–100.0)
Platelets: 198 10*3/uL (ref 150–400)
RBC: 2.44 MIL/uL — ABNORMAL LOW (ref 3.87–5.11)
RDW: 16 % — ABNORMAL HIGH (ref 11.5–15.5)
WBC: 7.5 10*3/uL (ref 4.0–10.5)
nRBC: 0 % (ref 0.0–0.2)

## 2023-09-19 LAB — HEMOGLOBIN AND HEMATOCRIT, BLOOD
HCT: 26.6 % — ABNORMAL LOW (ref 36.0–46.0)
Hemoglobin: 8.6 g/dL — ABNORMAL LOW (ref 12.0–15.0)

## 2023-09-19 SURGERY — IMAGING PROCEDURE, GI TRACT, INTRALUMINAL, VIA CAPSULE
Anesthesia: LOCAL

## 2023-09-19 MED ORDER — LOSARTAN POTASSIUM 50 MG PO TABS
100.0000 mg | ORAL_TABLET | Freq: Every day | ORAL | Status: DC
  Administered 2023-09-20: 100 mg via ORAL
  Filled 2023-09-19: qty 2

## 2023-09-19 MED ORDER — ACETAMINOPHEN 325 MG PO TABS
650.0000 mg | ORAL_TABLET | ORAL | Status: DC | PRN
  Administered 2023-09-20: 650 mg via ORAL
  Filled 2023-09-19: qty 2

## 2023-09-19 SURGICAL SUPPLY — 1 items: TOWEL COTTON PACK 4EA (MISCELLANEOUS) ×4 IMPLANT

## 2023-09-19 NOTE — Progress Notes (Signed)
 Inpatient Progress Note     Patient Profile/Chief Complaint  CC; melena, anemia in setting of recent start of Eliquis  , chronic Plavix    78 year old female admitted with, anemia in the setting of aspirin , Eliquis , Plavix .  EGD 09/16/2023-erythematous mucosa in the stomach doubtful clinical significance otherwise negative  Colonoscopy 09/17/2023-pandiverticulosis, 3 sessile nonbleeding polyps in the rectum up to 7 mm in size not removed in setting of recent GI bleeding and Plavix , small amount of scattered hematin in the right colon lavaged  GI bleeding has been attributed to a diverticular etiology, however, hemoglobin has fluctuated    Interval History   -- Hemoglobin this morning decreased to 7.9; repeat later today was 8.6 -- States that she had a brown stool without any melena -- Discussed video capsule endoscopy to ensure there is not a small bowel lesion being overlooked    Objective   Vital signs in last 24 hours: Temp:  [98 F (36.7 C)-98.8 F (37.1 C)] 98.4 F (36.9 C) (06/04 0821) Pulse Rate:  [65-73] 73 (06/04 0821) Resp:  [17-18] 18 (06/04 0821) BP: (116-148)/(49-68) 148/61 (06/04 0821) SpO2:  [98 %-100 %] 99 % (06/04 0821) Weight:  [16 kg] 67 kg (06/04 1529) Last BM Date : 09/18/23 General:    Alert, resting in bed no distress Heart:  Regular rate and rhythm; no murmurs Lungs: Respirations even and unlabored, lungs CTA bilaterally Abdomen:  Soft, nontender and nondistended. Normal bowel sounds. Extremities:  Without edema. Neurologic:  Alert and oriented,  grossly normal neurologically. Psych:  Cooperative. Normal mood and affect.  Intake/Output from previous day: No intake/output data recorded. Intake/Output this shift: No intake/output data recorded.  Lab Results: Recent Labs    09/18/23 0521 09/18/23 1216 09/18/23 1707 09/19/23 0544 09/19/23 1109  WBC 9.2 9.7  --  7.5  --   HGB 8.1* 8.4* 8.2* 7.9* 8.6*  HCT 25.0* 26.0* 25.1* 24.5* 26.6*  PLT  219 217  --  198  --    BMET Recent Labs    09/17/23 1106 09/19/23 0544  NA 140 138  K 3.4* 4.2  CL 107 110  CO2 25 24  GLUCOSE 117* 110*  BUN 13 16  CREATININE 1.15* 1.01*  CALCIUM  9.0 9.2   LFT No results for input(s): "PROT", "ALBUMIN", "AST", "ALT", "ALKPHOS", "BILITOT", "BILIDIR", "IBILI" in the last 72 hours. PT/INR No results for input(s): "LABPROT", "INR" in the last 72 hours.  Studies/Results: No results found.  Endoscopic Studies: EGD 09/16/2023- erythematous mucosa in the stomach doubtful clinical significance otherwise negative  Colonoscopy 09/17/2023-pandiverticulosis, 3 sessile nonbleeding polyps in the rectum up to 7 mm in size not removed in setting of recent GI bleeding and Plavix , small amount of scattered hematin in the right colon lavaged   Clinical Impression   78 year old female with history of CAD, CHF, HTN, SVT, PAF, left subclavian stenting and angioplasty admitted with GI bleeding on anticoagulation with Eliquis , Plavix  and aspirin .  EGD showed only erythematous mucosa in the stomach that was not felt to be of clinical significance.  Colonoscopy noteworthy for pandiverticulosis without active bleeding.  3 polyps were identified but not removed in the setting of recent bleeding and Plavix  use.  Ms. Neenan's clinical picture appears to be most consistent with diverticular bleeding.  That being said her hemoglobin has fluctuated and had declined to 7.9 this morning.  In that vein we discussed performing a video capsule endoscopy to ensure that a small bowel source of bleeding was not being overlooked.  Repeat hemoglobin later today was reassuring at 8.6.   Plan  Follow-up results of video capsule endoscopy Continue to monitor for overt bleeding Monitor serial H&H Continue aspirin  and Eliquis  Patient will need outpatient colonoscopy in the future for polyp removal after resolution of GI bleeding   LOS: 4 days   Truddie Furrow  09/19/2023, 5:06 PM  Eugenia Hess, MD [facility]

## 2023-09-19 NOTE — Plan of Care (Signed)

## 2023-09-19 NOTE — Progress Notes (Signed)
 Progress Note   Patient: Susan Gillespie ZOX:[id number] DOB: [birth year] DOA: 09/15/2023     4 DOS: the patient was seen and examined on 09/19/2023   Brief hospital course: 78 year old with past medical history significant for recent afib on eliquis , CAD, heart failure preserved ejection fraction, hypertension, hyperlipidemia, GERD presents with upper GI bleed. She was on chronic plavix  after MCA stenting.  Hb noted to be 6.9 on presentation, got 2 units RPBC. Seen by GI. EGD 09/16/2023-erythematous mucosa in the stomach doubtful clinical significance otherwise negative Colonoscopy 09/17/2023-pandiverticulosis, 3 sessile nonbleeding polyps in the rectum up to 7 mm in size not removed in setting of recent GI bleeding and Plavix , small amount of scattered hematin in the right colon lavaged.  Hb fluctuating, GI advised video capsule eval today.  Assessment and Plan: Upper GI bleed Acute blood loss anemia, symptomatic S/p 2 units of packed red blood cell orders GI follow up appreciated, Endoscopy: Negative mild erythematous mucosa in the stomach.  Colonoscopy right-sided diverticulosis Continue IV protonix   Per GI patient bleed could have been related to recent diverticular bleed. Resume anticoagulation with Eliquis , also started Aspirin  after discussing with neuro IR. Giver fluctuating Hb, a video capsule study ordered per GI. She will need outpatient colonoscopy for polyp removal.   Heart failure preserved ejection fraction Continue Cardizem  metoprolol  for Hold losartan  and Lasix  due to soft blood pressure   History of A-fib: Resumed Eliquis    Hyperlipidemia Continue Crestor    Insomnia Continue trazodone  and Xanax  as needed   History left MCA stenosis status post stenting x 2  On Plavix  before changed to aspirin  daily.   Hypokalemia; Replaced    Out of bed to chair. Incentive spirometry. Nursing supportive care. Fall, aspiration precautions. Diet:  Diet Orders (From admission,  onward)     Start     Ordered   09/19/23 1934  Diet NPO time specified  Diet effective now        09/19/23 1933           DVT prophylaxis: SCDs Start: 09/15/23 1650 apixaban  (ELIQUIS ) tablet 5 mg  Level of care: Telemetry Medical   Code Status: Full Code  Subjective: Patient is seen and examined today morning, She denies abdominal pain, rectal bleeding.  Physical Exam: Vitals:   09/19/23 0821 09/19/23 1529 09/19/23 1724 09/19/23 2001  BP: (!) 148/61  (!) 164/67 (!) 159/64  Pulse: 73  71 67  Resp: 18  17 18   Temp: 98.4 F (36.9 C)  98.4 F (36.9 C) 98.7 F (37.1 C)  TempSrc:      SpO2: 99%  100% 98%  Weight:  67 kg    Height:  5\' 3"  (1.6 m)      General - Elderly Caucasian pale female, no apparent distress HEENT - PERRLA, EOMI, atraumatic head, non tender sinuses. Lung - Clear, diffuse rales, rhonchi, no wheezes. Heart - S1, S2 heard, no murmurs, rubs, trace pedal edema. Abdomen - Soft, non tender, bowel sounds good Neuro - Alert, awake and oriented x 3, non focal exam. Skin - Warm and dry.  Data Reviewed:      Latest Ref Rng & Units 09/19/2023   11:09 AM 09/19/2023    5:44 AM 09/18/2023    5:07 PM  CBC  WBC 4.0 - 10.5 K/uL  7.5    Hemoglobin 12.0 - 15.0 g/dL 8.6  7.9  8.2   Hematocrit 36.0 - 46.0 % 26.6  24.5  25.1   Platelets 150 - 400 K/uL  198        Latest Ref Rng & Units 09/19/2023    5:44 AM 09/17/2023   11:06 AM 09/16/2023    9:41 AM  BMP  Glucose 70 - 99 mg/dL 657  846  962   BUN 8 - 23 mg/dL 16  13  25    Creatinine 0.44 - 1.00 mg/dL 9.52  8.41  3.24   Sodium 135 - 145 mmol/L 138  140  140   Potassium 3.5 - 5.1 mmol/L 4.2  3.4  3.9   Chloride 98 - 111 mmol/L 110  107  106   CO2 22 - 32 mmol/L 24  25  27    Calcium  8.9 - 10.3 mg/dL 9.2  9.0  9.1    No results found.  Family Communication: Discussed with patient, she understand and agree. All questions answered.  Disposition: Status is: Inpatient Remains inpatient appropriate because: GI work up,  video capsule endoscopy.  Planned Discharge Destination: Home     Time spent: 39 minutes  Author: Aisha Hove, MD 09/19/2023 10:28 PM Secure chat 7am to 7pm For on call review [email or web address].

## 2023-09-19 NOTE — Progress Notes (Signed)
 Patient swallowed PillCam today at 3:40 pm and will remain NPO for 2 hours.  At 5:40 pm, she can have clear liquids. At 7:40 pm, she can have a light snack. At 11:40 pm, she can resume previously ordered diet. At 3:40 am tomorrow, the leads can be removed and placed in the patient belonging bag with the monitor. An endoscopy team member will pick it up in the morning.

## 2023-09-20 ENCOUNTER — Ambulatory Visit: Payer: Self-pay | Admitting: Cardiology

## 2023-09-20 ENCOUNTER — Other Ambulatory Visit (HOSPITAL_BASED_OUTPATIENT_CLINIC_OR_DEPARTMENT_OTHER)

## 2023-09-20 ENCOUNTER — Other Ambulatory Visit (HOSPITAL_BASED_OUTPATIENT_CLINIC_OR_DEPARTMENT_OTHER): Payer: Self-pay

## 2023-09-20 ENCOUNTER — Ambulatory Visit: Admitting: Cardiology

## 2023-09-20 ENCOUNTER — Encounter (HOSPITAL_COMMUNITY): Payer: Self-pay | Admitting: Pediatrics

## 2023-09-20 DIAGNOSIS — I471 Supraventricular tachycardia, unspecified: Secondary | ICD-10-CM

## 2023-09-20 DIAGNOSIS — D62 Acute posthemorrhagic anemia: Secondary | ICD-10-CM | POA: Diagnosis not present

## 2023-09-20 DIAGNOSIS — K31819 Angiodysplasia of stomach and duodenum without bleeding: Secondary | ICD-10-CM | POA: Diagnosis not present

## 2023-09-20 DIAGNOSIS — Z79899 Other long term (current) drug therapy: Secondary | ICD-10-CM

## 2023-09-20 DIAGNOSIS — K921 Melena: Secondary | ICD-10-CM | POA: Diagnosis not present

## 2023-09-20 DIAGNOSIS — K5790 Diverticulosis of intestine, part unspecified, without perforation or abscess without bleeding: Secondary | ICD-10-CM | POA: Diagnosis not present

## 2023-09-20 DIAGNOSIS — I480 Paroxysmal atrial fibrillation: Secondary | ICD-10-CM

## 2023-09-20 DIAGNOSIS — E876 Hypokalemia: Secondary | ICD-10-CM

## 2023-09-20 DIAGNOSIS — Z7902 Long term (current) use of antithrombotics/antiplatelets: Secondary | ICD-10-CM | POA: Diagnosis not present

## 2023-09-20 LAB — CBC
HCT: 24.5 % — ABNORMAL LOW (ref 36.0–46.0)
Hemoglobin: 8.1 g/dL — ABNORMAL LOW (ref 12.0–15.0)
MCH: 32.7 pg (ref 26.0–34.0)
MCHC: 33.1 g/dL (ref 30.0–36.0)
MCV: 98.8 fL (ref 80.0–100.0)
Platelets: 213 10*3/uL (ref 150–400)
RBC: 2.48 MIL/uL — ABNORMAL LOW (ref 3.87–5.11)
RDW: 15.4 % (ref 11.5–15.5)
WBC: 9.4 10*3/uL (ref 4.0–10.5)
nRBC: 0 % (ref 0.0–0.2)

## 2023-09-20 LAB — BASIC METABOLIC PANEL WITH GFR
Anion gap: 6 (ref 5–15)
BUN: 10 mg/dL (ref 8–23)
CO2: 23 mmol/L (ref 22–32)
Calcium: 9 mg/dL (ref 8.9–10.3)
Chloride: 109 mmol/L (ref 98–111)
Creatinine, Ser: 0.93 mg/dL (ref 0.44–1.00)
GFR, Estimated: >60 mL/min
Glucose, Bld: 107 mg/dL — ABNORMAL HIGH (ref 70–99)
Potassium: 4 mmol/L (ref 3.5–5.1)
Sodium: 138 mmol/L (ref 135–145)

## 2023-09-20 MED ORDER — ASPIRIN 81 MG PO TBEC
81.0000 mg | DELAYED_RELEASE_TABLET | Freq: Every day | ORAL | 12 refills | Status: AC
  Filled 2023-09-20 – 2023-09-28 (×2): qty 30, 30d supply, fill #0

## 2023-09-20 NOTE — Plan of Care (Signed)

## 2023-09-20 NOTE — Telephone Encounter (Signed)
-----   Message from Gaylyn Keas sent at 09/20/2023  8:54 AM EDT ----- Heart monitor showed a fast heart rate coming from the top of the heart called SVT that lasted 4 minutes and 36 seconds and corresponded when she complained of palpitations.  There were also some extra heartbeats from the top of the heart called PACs and extra heartbeats from the bottom of the heart called PVCs which are benign.  TSH was normal in February.  I would like her to come in for a mag level.Please find out if she has had any more palpitations on current medical therapy

## 2023-09-20 NOTE — Telephone Encounter (Signed)
 Call to patient to advise heart monitor showed a fast heart rate coming from the top of the heart called SVT that lasted 4 minutes and 36 seconds and corresponded when she complained of palpitations.  There were also some extra heartbeats from the top of the heart called PACs and extra heartbeats from the bottom of the heart called PVCs which are benign.  Patient agrees to come in for mag level.   Patient states she has had one episode of palpitations since she saw Dr. [name] on 08/28/23 and it was resolved fully with one dose of her 50  mg Toprol  XL which she takes as needed for palpitations.   Patient states she did not see any episodes of afib documented on heart monitor report, she is asking if she needs to stay on eliquis . Forwarded to Dr. [name].

## 2023-09-20 NOTE — Discharge Summary (Addendum)
 Physician Discharge Summary   Patient: Susan Gillespie MRN: [id number] DOB: November 02, 1945  Admit date:     09/15/2023  Discharge date: 09/20/23  Discharge Physician: Aisha Hove   PCP: Colene Dauphin, MD   Recommendations at discharge:   PCP follow up in 1 week. GI follow up as scheduled. Cardiology follow up suggested.  Discharge Diagnoses: Principal Problem:   GI bleed Active Problems:   PAF (paroxysmal atrial fibrillation) (HCC)   Melena   ABLA (acute blood loss anemia)   Diverticulosis  Resolved Problems:   * No resolved hospital problems. *  Hospital Course: 78 year old with past medical history significant for recent afib on eliquis , CAD, heart failure preserved ejection fraction, hypertension, hyperlipidemia, GERD presents with upper GI bleed. She was on chronic plavix  after MCA stenting.  Hb noted to be 6.9 on presentation, got 2 units RPBC. Seen by GI. EGD 09/16/2023-erythematous mucosa in the stomach doubtful clinical significance otherwise negative Colonoscopy 09/17/2023-pandiverticulosis, 3 sessile nonbleeding polyps in the rectum up to 7 mm in size not removed in setting of recent GI bleeding and Plavix , small amount of scattered hematin in the right colon lavaged.   Assessment and Plan: Diverticular bleed Acute blood loss anemia, symptomatic S/p 2 units of packed red blood cell transfusion. GI followed her. S/p Endoscopy: mild erythematous mucosa in the stomach.  Colonoscopy showed right-sided diverticulosis Per GI patient bleed could have been related to recent diverticular bleed. Resumed anticoagulation with Eliquis , also started Aspirin  after discussing with neuro IR. 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 Seems like a less likely source of bleeding then diverticular. Hemoglobin is stable. Advised outpatient GI close follow up. Advised PCP and Cardiology follow up.  She wants to discuss with Cardiology regarding anticoagulation continuation.   Heart failure preserved ejection fraction Resumed Cardizem , metoprolol , losartan , Lasix .   History of A-fib: Resumed Eliquis  therapy.   Hyperlipidemia Continue Crestor    Insomnia Continue trazodone  and Xanax  as needed   History left MCA stenosis status post stenting x 2  Was on Plavix  before changed to aspirin  daily per neurosurgery recommendation.   Hypokalemia; Replaced. Patient will continue oral potassium supplements. Advised patient follow-up.      Consultants: GI Procedures performed: EGD, Colonoscopy, Video capsule study  Disposition: Home Diet recommendation:  Discharge Diet Orders (From admission, onward)     Start     Ordered   09/20/23 0000  Diet - low sodium heart healthy        09/20/23 1152           Cardiac diet DISCHARGE MEDICATION: Allergies as of 09/20/2023       Reactions   Phenergan  [promethazine  Hcl] Other (See Comments)   IV suppressed breathing    Daypro [oxaprozin] Swelling   facial   Lisinopril     cough   Oxycodone  Itching   Simvastatin    REACTION: myalgias and fatigue   Tramadol          Medication List     STOP taking these medications    clopidogrel  75 MG tablet Commonly known as: PLAVIX    diclofenac  50 MG EC tablet Commonly known as: VOLTAREN        TAKE these medications    ALPRAZolam  0.5 MG tablet Commonly known as: XANAX  Take ONE-HALF to 1 tablets (0.25-0.5 mg total) by mouth at bedtime as needed What changed: reasons to take this   ascorbic acid 500  MG tablet Commonly known as: VITAMIN C Take 500 mg by mouth daily.   aspirin  EC 81 MG tablet Take 1 tablet (81 mg total) by mouth daily. Swallow whole. Start taking on: September 21, 2023   cetirizine 10 MG tablet Commonly known as: ZYRTEC Take 10 mg by mouth every morning.   diltiazem  180 MG 24 hr capsule Commonly known as: CARDIZEM  CD TAKE 1 CAPSULE (180 MG TOTAL) BY MOUTH AT  BEDTIME.   Eliquis  5 MG Tabs tablet Generic drug: apixaban  Take 1 tablet (5 mg total) by mouth 2 (two) times daily.   estradiol  0.1 MG/GM vaginal cream Commonly known as: ESTRACE  Place 1 Applicatorful vaginally 2 (two) times a week.   famotidine  40 MG tablet Commonly known as: PEPCID  Take 1 tablet (40 mg total) by mouth daily. What changed: when to take this   ferrous sulfate  325 (65 FE) MG tablet Take 325 mg by mouth daily with breakfast.   FISH OIL PO Take 1 tablet by mouth daily.   fluticasone  50 MCG/ACT nasal spray Commonly known as: FLONASE  Place 1 spray into both nostrils daily as needed for allergies.   furosemide  20 MG tablet Commonly known as: LASIX  TAKE 1 TABLET (20 MG TOTAL) BY MOUTH DAILY. MAY TAKE AN ADDITIONAL TABLET FOR EDEMA   losartan  100 MG tablet Commonly known as: COZAAR  TAKE 1 TABLET BY MOUTH EVERY DAY   methocarbamol  500 MG tablet Commonly known as: ROBAXIN  TAKE 1 TABLET (500 MG TOTAL) BY MOUTH DAILY AS NEEDED FOR MUSCLE SPASMS.   metoprolol  succinate 50 MG 24 hr tablet Commonly known as: TOPROL -XL Take 50 mg by mouth as needed (svt). Take with or immediately following a meal. -   mirabegron  ER 50 MG Tb24 tablet Commonly known as: Myrbetriq  Take 1 tablet (50 mg total) by mouth daily. What changed:  how much to take when to take this   nitroGLYCERIN  0.4 MG SL tablet Commonly known as: NITROSTAT  Place 0.4 mg under the tongue every 5 (five) minutes as needed for chest pain.   oxyCODONE -acetaminophen  5-325 MG tablet Commonly known as: PERCOCET/ROXICET Take 0.5 tablets by mouth every 6 (six) hours as needed for moderate pain (pain score 4-6).   potassium chloride  10 MEQ tablet Commonly known as: KLOR-CON  TAKE 1 TABLET BY MOUTH EVERY DAY   rosuvastatin  20 MG tablet Commonly known as: CRESTOR  TAKE 1 TABLET BY MOUTH EVERYDAY AT BEDTIME   traZODone  100 MG tablet Commonly known as: DESYREL  TAKE 1 TABLET AT BEDTIME. PER MD RETURN IN ABOUT 6  MONTHS (AROUND 11/22/2022) FOR PHYSICAL EXAM.   vitamin B-12 500 MCG tablet Commonly known as: CYANOCOBALAMIN  Take 500 mcg by mouth daily.        Discharge Exam: Filed Weights   09/15/23 1800 09/17/23 0848 09/19/23 1529  Weight: 67 kg 67 kg 67 kg      09/20/2023    8:05 AM 09/20/2023    4:44 AM 09/19/2023    8:01 PM  Vitals with BMI  Systolic 143 137 161  Diastolic 54 45 64  Pulse 73 71 67   General - Elderly Caucasian pale female, no apparent distress HEENT - PERRLA, EOMI, atraumatic head, non tender sinuses. Lung - Clear, diffuse rales, rhonchi, no wheezes. Heart - S1, S2 heard, no murmurs, rubs, trace pedal edema. Abdomen - Soft, non tender, bowel sounds good Neuro - Alert, awake and oriented x 3, non focal exam. Skin - Warm and dry.  Condition at discharge: stable  The results of significant diagnostics  from this hospitalization (including imaging, microbiology, ancillary and laboratory) are listed below for reference.   Imaging Studies: LONG TERM MONITOR (3-14 DAYS) Result Date: 09/20/2023   Predominant rhythm was normal sinus rhythm with an average heart rate of 67 bpm and ranged from 46 to 103 bpm.   SVT lasting as long as 4 minutes 36 seconds at a max heart rate of 160 bpm this corresponded to patient's symptoms of palpitations   Occasional PACs, atrial couplets and triplets   Rare PVCs Patch Wear Time:  13 days and 18 hours (2025-05-16T16:40:08-398 to 2025-05-30T10:50:23-0400) Patient had a min HR of 46 bpm, max HR of 160 bpm, and avg HR of 67 bpm. Predominant underlying rhythm was Sinus Rhythm. 37 Supraventricular Tachycardia runs occurred, the run with the fastest interval lasting 4 mins 36 secs with a max rate of 160 bpm (avg 142 bpm); the run with the fastest interval was also the longest. Supraventricular Tachycardia was detected within +/- 45 seconds of symptomatic patient event(s). Isolated SVEs were occasional (2.9%, 34892), SVE Couplets were rare (<1.0%, 3598), and   SVE Triplets were rare (<1.0%, 1375). Isolated VEs were rare (<1.0%), and no VE Couplets or VE Triplets were present.   DG Chest Port 1 View Result Date: 09/15/2023 CLINICAL DATA:  Shortness of breath EXAM: PORTABLE CHEST 1 VIEW COMPARISON:  Chest x-ray performed May 09, 2016 FINDINGS: The heart size and mediastinal contours are within normal limits. Both lungs are clear. The visualized skeletal structures are unchanged. IMPRESSION: No active disease. Electronically Signed   By: Reagan Camera M.D.   On: 09/15/2023 12:33    Microbiology: Results for orders placed or performed in visit on 11/22/22  Urine Culture     Status: Abnormal   Collection Time: 11/22/22 10:45 AM   Specimen: Urine  Result Value Ref Range Status   Source: NOT GIVEN  Final   Status: FINAL  Final   Isolate 1: Klebsiella aerogenes (A)  Final    Comment: Greater than 100,000 CFU/mL of Klebsiella aerogenes (Enterobacter)      Susceptibility   Klebsiella aerogenes - URINE CULTURE, REFLEX    AMOX/CLAVULANIC >=32 Resistant     CEFAZOLIN * >=64 Resistant      * For uncomplicated UTI caused by E. coli, K. pneumoniae or P. mirabilis: Cefazolin  is susceptible if MIC <32 mcg/mL and predicts susceptible to the oral agents cefaclor, cefdinir, cefpodoxime, cefprozil, cefuroxime, cephalexin  and loracarbef.     CEFTAZIDIME <=1 Sensitive     CEFEPIME <=1 Sensitive     CEFTRIAXONE <=1 Sensitive     CIPROFLOXACIN <=0.25 Sensitive     LEVOFLOXACIN <=0.12 Sensitive     GENTAMICIN <=1 Sensitive     IMIPENEM 1 Sensitive     NITROFURANTOIN 64 Intermediate     PIP/TAZO <=4 Sensitive     TOBRAMYCIN <=1 Sensitive     TRIMETH /SULFA * <=20 Sensitive      * For uncomplicated UTI caused by E. coli, K. pneumoniae or P. mirabilis: Cefazolin  is susceptible if MIC <32 mcg/mL and predicts susceptible to the oral agents cefaclor, cefdinir, cefpodoxime, cefprozil, cefuroxime, cephalexin  and loracarbef. Legend: S = Susceptible  I =  Intermediate R = Resistant  NS = Not susceptible * = Not tested  NR = Not reported **NN = See antimicrobic comments     Labs: CBC: Recent Labs  Lab 09/16/23 0941 09/16/23 1207 09/18/23 0521 09/18/23 1216 09/18/23 1707 09/19/23 0544 09/19/23 1109 09/20/23 0640  WBC 6.8  --  9.2 9.7  --  7.5  --  9.4  HGB 9.8*   < > 8.1* 8.4* 8.2* 7.9* 8.6* 8.1*  HCT 28.8*   < > 25.0* 26.0* 25.1* 24.5* 26.6* 24.5*  MCV 95.7  --  99.6 100.4*  --  100.4*  --  98.8  PLT 252  --  219 217  --  198  --  213   < > = values in this interval not displayed.   Basic Metabolic Panel: Recent Labs  Lab 09/15/23 1211 09/16/23 0941 09/17/23 1106 09/19/23 0544 09/20/23 0640  NA 139 140 140 138 138  K 4.1 3.9 3.4* 4.2 4.0  CL 103 106 107 110 109  CO2 24 27 25 24 23   GLUCOSE 118* 104* 117* 110* 107*  BUN 40* 25* 13 16 10   CREATININE 1.47* 1.38* 1.15* 1.01* 0.93  CALCIUM  9.8 9.1 9.0 9.2 9.0   Liver Function Tests: No results for input(s): "AST", "ALT", "ALKPHOS", "BILITOT", "PROT", "ALBUMIN" in the last 168 hours. CBG: No results for input(s): "GLUCAP" in the last 168 hours.  Discharge time spent: 36 minutes.  Signed: Aisha Hove, MD [facility] 09/20/2023

## 2023-09-20 NOTE — Discharge Instructions (Signed)

## 2023-09-20 NOTE — Progress Notes (Signed)
 Inpatient Progress Note     Patient Profile/Chief Complaint  CC; melena, anemia in setting of recent start of Eliquis  , chronic Plavix    78 year old female admitted with, anemia in the setting of aspirin , Eliquis , Plavix .  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 Normal bowel sounds. Extremities:  Without edema. Neurologic:  Alert and oriented,  grossly normal neurologically. Psych:  Cooperative. Normal mood and affect.  Intake/Output from previous day: No intake/output data recorded. Intake/Output this shift: No intake/output data recorded.  Lab Results: Recent Labs    09/18/23 1216 09/18/23 1707 09/19/23 0544 09/19/23 1109 09/20/23 0640  WBC 9.7  --  7.5  --  9.4  HGB 8.4*   < > 7.9* 8.6* 8.1*  HCT 26.0*   < > 24.5* 26.6* 24.5*  PLT 217  --  198  --  213   < > = values in this interval not displayed.   BMET Recent Labs     09/19/23 0544 09/20/23 0640  NA 138 138  K 4.2 4.0  CL 110 109  CO2 24 23  GLUCOSE 110* 107*  BUN 16 10  CREATININE 1.01* 0.93  CALCIUM  9.2 9.0   LFT No results for input(s): "PROT", "ALBUMIN", "AST", "ALT", "ALKPHOS", "BILITOT", "BILIDIR", "IBILI" in the last 72 hours. PT/INR No results for input(s): "LABPROT", "INR" in the last 72 hours.  Studies/Results: LONG TERM MONITOR (3-14 DAYS) Result Date: 09/20/2023   Predominant rhythm was normal sinus rhythm with an average heart rate of 67 bpm and ranged from 46 to 103 bpm.   SVT lasting as long as 4 minutes 36 seconds at a max heart rate of 160 bpm this corresponded to patient's symptoms of palpitations   Occasional PACs, atrial couplets and triplets   Rare PVCs Patch Wear Time:  13 days and 18 hours (2025-05-16T16:40:08-398 to 2025-05-30T10:50:23-0400) Patient had a min HR of 46 bpm, max HR of 160 bpm, and avg HR of 67 bpm. Predominant underlying rhythm was Sinus Rhythm. 37 Supraventricular Tachycardia runs occurred, the run with the fastest interval lasting 4 mins 36 secs with a max rate of 160 bpm (avg 142 bpm); the run with the fastest interval was also the longest. Supraventricular Tachycardia was detected within +/- 45 seconds of symptomatic patient event(s). Isolated SVEs were occasional (2.9%, 34892), SVE Couplets were rare (<1.0%, 3598), and  SVE Triplets were rare (<1.0%, 1375). Isolated VEs were rare (<1.0%), and no  VE Couplets or VE Triplets were present.    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  EGD showed only erythematous mucosa in the stomach that was not felt to be of clinical significance.  Colonoscopy noteworthy for pandiverticulosis without active bleeding.  3 polyps were identified but not removed in the setting of recent bleeding and Plavix  use.  Ms. Macy's clinical picture appears to be most consistent with diverticular bleeding.  That being said her hemoglobin has fluctuated.  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  I reviewed with Ms. Kopp that while the small bowel angioectasia could have contributed to bleeding, a diverticular bleed seems more likely.  She is overall clinically stable today and amenable to hospital discharge.    Plan  Plan for hospital discharge today Recommend iron supplementation Continue aspirin  and Eliquis  Patient will need outpatient colonoscopy in the future for polyp removal after resolution of GI bleeding and improvement in anemia   LOS: 5 days   Truddie Furrow  09/20/2023, 4:15 PM  Eugenia Hess, MD [facility]

## 2023-09-22 ENCOUNTER — Ambulatory Visit: Payer: Self-pay | Admitting: Gastroenterology

## 2023-09-24 DIAGNOSIS — Z79899 Other long term (current) drug therapy: Secondary | ICD-10-CM | POA: Diagnosis not present

## 2023-09-24 DIAGNOSIS — I471 Supraventricular tachycardia, unspecified: Secondary | ICD-10-CM | POA: Diagnosis not present

## 2023-09-25 ENCOUNTER — Other Ambulatory Visit (HOSPITAL_BASED_OUTPATIENT_CLINIC_OR_DEPARTMENT_OTHER): Payer: Self-pay

## 2023-09-25 LAB — MAGNESIUM: Magnesium: 2.1 mg/dL (ref 1.6–2.3)

## 2023-09-25 NOTE — Telephone Encounter (Signed)
-----   Message from Gaylyn Keas sent at 09/20/2023  6:53 PM EDT ----- No evidence of afib on her heart monitor and after looking further into her EKGs at ER DB from March 12/2023 that I could not access before - the EKG was read out as atrial fibrillation but when I personally reviewed it there is no evidence of afib and is NSR - please have her stop Eliquis  permanently ----- Message ----- From: Cherylyn Cos, RN Sent: 09/20/2023   3:36 PM EDT To: Jacqueline Matsu, MD  ----- Message from Cherylyn Cos, RN sent at 09/20/2023  3:36 PM EDT ----- Patient responses.

## 2023-09-25 NOTE — Telephone Encounter (Signed)
 Call to patient to advise there was no evidence of afib on her heart monitor and after looking further into her EKGs at ER DB from March 12/2023 that Dr. [name] could not access before - Dr. [name] feels there is no evidence of afib. Explained that Dr. [name] advises patient can stop Eliquis  permanently.  Patient asks for Dr. [name] to clarify if she is to go back on plavix . Forwarded patient responses to Dr. [name].

## 2023-09-25 NOTE — Telephone Encounter (Signed)
 Call to patient to advise magnesium  was normal, patient verbalized understanding.

## 2023-09-25 NOTE — Progress Notes (Unsigned)
 Subjective:    Patient ID: Susan Gillespie, female    DOB: Aug 19, 1945, 78 y.o.   MRN: [id number]     HPI Susan Gillespie is here for follow up from the hospital  Admitted 5/31 - 6/5  Recommendations at discharge:    PCP follow up in 1 week. GI follow up as scheduled. Cardiology follow up suggested.  Presented with increased SOB and fatigue after starting eliquis  ( was on plavix  - changed to eliquis , but she was taking Plavix , aspirin  and Eliquis ).  She was not able to walk through her house w/o having to rest.  She had two bowel movements that morning that were very dark and tarry.  In ED BUN / Cr 40/1.47, Hb 6.9 (10-12), FOBT positive.    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  She is experiencing hemorrhoids and has been using Tucks pads, baby balm.  She did have a twinge of blood when she wiped today.  She plans on starting sitz bath's which did help her in the past.  Cardiology advised her to stop Eliquis  and that she likely never had atrial fibrillation.  She will follow-up with them to discuss further-she would rather stay on the Eliquis  and go back on Plavix .  She denies any chest pain, palpitations.  She is still having shortness of breath.  She is still having fatigue-she has seen mild  improvement since getting home from the hospital.  She is eating well and trying to stay hydrated.    Medications and allergies reviewed with patient and updated if appropriate.  Current Outpatient Medications on File Prior to Visit  Medication Sig Dispense Refill   ALPRAZolam  (XANAX ) 0.5 MG tablet Take ONE-HALF to 1 tablets (0.25-0.5 mg total) by mouth at bedtime as needed (Patient taking differently: Take 0.25-0.5 mg by mouth at bedtime as needed for anxiety.) 30 tablet 5   apixaban  (ELIQUIS ) 5 MG TABS tablet Take 1 tablet (5 mg total) by mouth 2 (two) times daily. 180 tablet 3   ascorbic acid (VITAMIN C) 500 MG tablet Take 500 mg by mouth daily.     aspirin  EC 81 MG tablet Take 1 tablet (81 mg total) by mouth daily. Swallow whole. 30 tablet 12   cetirizine (ZYRTEC) 10 MG tablet Take 10 mg by mouth every morning.     diltiazem  (CARDIZEM  CD) 180 MG 24 hr capsule TAKE 1 CAPSULE (180 MG TOTAL) BY MOUTH AT BEDTIME. 90 capsule 2   estradiol  (ESTRACE ) 0.1 MG/GM vaginal cream Place 1 Applicatorful vaginally 2 (two) times a week. 127.5 g 4   famotidine  (PEPCID ) 40 MG tablet Take 1 tablet (40 mg total) by mouth daily. (Patient taking differently: Take 40 mg by mouth every evening.) 90 tablet 2   ferrous sulfate  325 (65 FE) MG tablet Take 325 mg by mouth daily with breakfast.  fluticasone  (FLONASE ) 50 MCG/ACT nasal spray Place 1 spray into both nostrils daily as needed for allergies.     furosemide  (LASIX ) 20 MG tablet TAKE 1 TABLET (20 MG TOTAL) BY MOUTH DAILY. MAY TAKE AN ADDITIONAL TABLET FOR EDEMA 135 tablet 2   losartan  (COZAAR ) 100 MG tablet TAKE 1 TABLET BY MOUTH EVERY DAY 90 tablet 1   methocarbamol  (ROBAXIN ) 500 MG tablet TAKE 1 TABLET (500 MG TOTAL) BY MOUTH DAILY AS NEEDED FOR MUSCLE SPASMS. 60 tablet 2   metoprolol  succinate (TOPROL -XL) 50 MG 24 hr tablet Take 50 mg by mouth as needed (svt). Take with or immediately following a meal. -     mirabegron  ER (MYRBETRIQ ) 50 MG TB24 tablet Take  1 tablet (50 mg total) by mouth daily. (Patient taking differently: Take 25 mg by mouth 2 (two) times daily.) 90 tablet 2   nitroGLYCERIN  (NITROSTAT ) 0.4 MG SL tablet Place 0.4 mg under the tongue every 5 (five) minutes as needed for chest pain.     Omega-3 Fatty Acids (FISH OIL PO) Take 1 tablet by mouth daily.     oxyCODONE -acetaminophen  (PERCOCET/ROXICET) 5-325 MG tablet Take 0.5 tablets by mouth every 6 (six) hours as needed for moderate pain (pain score 4-6).     potassium chloride  (KLOR-CON ) 10 MEQ tablet TAKE 1 TABLET BY MOUTH EVERY DAY 90 tablet 1   rosuvastatin  (CRESTOR ) 20 MG tablet TAKE 1 TABLET BY MOUTH EVERYDAY AT BEDTIME 90 tablet 1   traZODone  (DESYREL ) 100 MG tablet TAKE 1 TABLET AT BEDTIME. PER MD RETURN IN ABOUT 6 MONTHS (AROUND 11/22/2022) FOR PHYSICAL EXAM. 90 tablet 1   vitamin B-12 (CYANOCOBALAMIN ) 500 MCG tablet Take 500 mcg by mouth daily.     No current facility-administered medications on file prior to visit.     Review of Systems  Constitutional:  Positive for fatigue. Negative for fever.  Respiratory:  Positive for shortness of breath.   Cardiovascular:  Negative for chest pain, palpitations and leg swelling.  Gastrointestinal:  Positive for anal bleeding, constipation and nausea (this morning). Negative for abdominal pain and blood in stool (no melena).       No gerd  Neurological:  Positive for light-headedness (this morning). Negative for headaches.  Psychiatric/Behavioral:  Positive for sleep disturbance (not great).        Objective:   Vitals:   09/26/23 1024  BP: (!) 130/56  Pulse: 69  Temp: 98 F (36.7 C)  SpO2: 97%   BP Readings from Last 3 Encounters:  09/26/23 (!) 130/56  09/20/23 (!) 143/54  09/15/23 125/60   Wt Readings from Last 3 Encounters:  09/26/23 141 lb (64 kg)  09/19/23 147 lb 11.3 oz (67 kg)  09/15/23 143 lb (64.9 kg)   Body mass index is 24.98 kg/m.    Physical Exam Constitutional:      General: She is not in acute  distress.    Appearance: Normal appearance.  HENT:     Head: Normocephalic and atraumatic.  Eyes:     Conjunctiva/sclera: Conjunctivae normal.  Cardiovascular:     Rate and Rhythm: Normal rate and regular rhythm.     Heart sounds: Normal heart sounds.  Pulmonary:     Effort: Pulmonary effort is normal. No respiratory distress.     Breath sounds: Normal breath sounds. No wheezing.  Abdominal:     General: There is no distension.     Palpations: Abdomen is soft.     Tenderness: There is no abdominal tenderness. There is  no guarding or rebound.  Musculoskeletal:     Cervical back: Neck supple.     Right lower leg: No edema.     Left lower leg: No edema.  Lymphadenopathy:     Cervical: No cervical adenopathy.  Skin:    General: Skin is warm and dry.     Findings: No rash.  Neurological:     Mental Status: She is alert. Mental status is at baseline.  Psychiatric:        Mood and Affect: Mood normal.        Behavior: Behavior normal.        Lab Results  Component Value Date   WBC 9.4 09/20/2023   HGB 8.1 (L) 09/20/2023   HCT 24.5 (L) 09/20/2023   PLT 213 09/20/2023   GLUCOSE 107 (H) 09/20/2023   CHOL 137 05/23/2023   TRIG 144.0 05/23/2023   HDL 70.50 05/23/2023   LDLDIRECT 50.0 10/30/2018   LDLCALC 37 05/23/2023   ALT 13 06/24/2023   AST 18 06/24/2023   NA 138 09/20/2023   K 4.0 09/20/2023   CL 109 09/20/2023   CREATININE 0.93 09/20/2023   BUN 10 09/20/2023   CO2 23 09/20/2023   TSH 1.51 05/23/2023   INR 0.9 06/24/2023   HGBA1C 6.3 05/23/2023     Assessment & Plan:    See Problem List for Assessment and Plan of chronic medical problems.

## 2023-09-26 ENCOUNTER — Encounter: Payer: Self-pay | Admitting: Internal Medicine

## 2023-09-26 ENCOUNTER — Other Ambulatory Visit (HOSPITAL_BASED_OUTPATIENT_CLINIC_OR_DEPARTMENT_OTHER): Payer: Self-pay

## 2023-09-26 ENCOUNTER — Ambulatory Visit (INDEPENDENT_AMBULATORY_CARE_PROVIDER_SITE_OTHER): Admitting: Internal Medicine

## 2023-09-26 ENCOUNTER — Other Ambulatory Visit: Payer: Self-pay | Admitting: Interventional Radiology

## 2023-09-26 VITALS — BP 130/56 | HR 69 | Temp 98.0°F | Ht 63.0 in | Wt 141.0 lb

## 2023-09-26 DIAGNOSIS — R5383 Other fatigue: Secondary | ICD-10-CM

## 2023-09-26 DIAGNOSIS — K219 Gastro-esophageal reflux disease without esophagitis: Secondary | ICD-10-CM

## 2023-09-26 DIAGNOSIS — G4709 Other insomnia: Secondary | ICD-10-CM | POA: Diagnosis not present

## 2023-09-26 DIAGNOSIS — I10 Essential (primary) hypertension: Secondary | ICD-10-CM | POA: Diagnosis not present

## 2023-09-26 DIAGNOSIS — F418 Other specified anxiety disorders: Secondary | ICD-10-CM | POA: Diagnosis not present

## 2023-09-26 DIAGNOSIS — K921 Melena: Secondary | ICD-10-CM | POA: Diagnosis not present

## 2023-09-26 DIAGNOSIS — I5032 Chronic diastolic (congestive) heart failure: Secondary | ICD-10-CM

## 2023-09-26 LAB — CBC WITH DIFFERENTIAL/PLATELET
Absolute Lymphocytes: 826 {cells}/uL — ABNORMAL LOW (ref 850–3900)
Absolute Monocytes: 506 {cells}/uL (ref 200–950)
Basophils Absolute: 19 {cells}/uL (ref 0–200)
Basophils Relative: 0.3 %
Eosinophils Absolute: 243 {cells}/uL (ref 15–500)
Eosinophils Relative: 3.8 %
HCT: 30 % — ABNORMAL LOW (ref 35.0–45.0)
Hemoglobin: 9.3 g/dL — ABNORMAL LOW (ref 11.7–15.5)
MCH: 31.7 pg (ref 27.0–33.0)
MCHC: 31 g/dL — ABNORMAL LOW (ref 32.0–36.0)
MCV: 102.4 fL — ABNORMAL HIGH (ref 80.0–100.0)
MPV: 9.3 fL (ref 7.5–12.5)
Monocytes Relative: 7.9 %
Neutro Abs: 4806 {cells}/uL (ref 1500–7800)
Neutrophils Relative %: 75.1 %
Platelets: 373 10*3/uL (ref 140–400)
RBC: 2.93 10*6/uL — ABNORMAL LOW (ref 3.80–5.10)
RDW: 13.7 % (ref 11.0–15.0)
Total Lymphocyte: 12.9 %
WBC: 6.4 10*3/uL (ref 3.8–10.8)

## 2023-09-26 MED ORDER — CLOPIDOGREL BISULFATE 75 MG PO TABS
75.0000 mg | ORAL_TABLET | Freq: Every day | ORAL | 2 refills | Status: DC
  Filled 2023-09-26: qty 90, 90d supply, fill #0

## 2023-09-26 NOTE — Assessment & Plan Note (Signed)
Chronic Controlled, stable Continue trazodone 100 mg nightly 

## 2023-09-26 NOTE — Patient Instructions (Addendum)
      Blood work was ordered.       Medications changes include :   None

## 2023-09-26 NOTE — Assessment & Plan Note (Signed)
 Chronic Blood pressure controlled Continue Cardizem  180 mg daily, losartan  100 mg daily

## 2023-09-26 NOTE — Assessment & Plan Note (Signed)
 Acutely Related to recent GI bleed, anemia Has improved slightly since discharge, but still has significant anemia Anemia should slowly improve Continue good diet and hydration Recheck CBC today She will let me know if her fatigue does not continue to improve

## 2023-09-26 NOTE — Assessment & Plan Note (Signed)
Chronic Intermittent Continue xanax 0.25-0.5 mg daily prn

## 2023-09-26 NOTE — Assessment & Plan Note (Signed)
 Acute Admitted with GI bleed-likely diverticular Had EGD, colonoscopy video capsule of small bowel S/P 2 units packed red blood cells Eliquis , Plavix  and aspirin  initially held and then restarted Eliquis  and aspirin  Hemoglobin stable Will check CBC today Will follow-up with GI as an outpatient

## 2023-09-26 NOTE — Assessment & Plan Note (Signed)
Chronic Euvolemic Following with cardiology Continue current medications-taking furosemide 20 mg daily

## 2023-09-26 NOTE — Assessment & Plan Note (Signed)
Chronic GERD controlled Continue famotidine 40 mg daily

## 2023-09-27 ENCOUNTER — Ambulatory Visit: Payer: Self-pay | Admitting: Internal Medicine

## 2023-09-28 ENCOUNTER — Telehealth (HOSPITAL_COMMUNITY): Payer: Self-pay

## 2023-09-28 ENCOUNTER — Other Ambulatory Visit: Payer: Self-pay | Admitting: Internal Medicine

## 2023-09-28 ENCOUNTER — Other Ambulatory Visit: Payer: Self-pay | Admitting: Cardiology

## 2023-09-28 ENCOUNTER — Other Ambulatory Visit (HOSPITAL_BASED_OUTPATIENT_CLINIC_OR_DEPARTMENT_OTHER): Payer: Self-pay

## 2023-09-28 DIAGNOSIS — G4709 Other insomnia: Secondary | ICD-10-CM

## 2023-09-28 DIAGNOSIS — E785 Hyperlipidemia, unspecified: Secondary | ICD-10-CM

## 2023-09-28 MED ORDER — DILTIAZEM HCL ER COATED BEADS 180 MG PO CP24
180.0000 mg | ORAL_CAPSULE | Freq: Every day | ORAL | 0 refills | Status: DC
  Filled 2023-09-28: qty 30, 30d supply, fill #0

## 2023-09-28 MED ORDER — LOSARTAN POTASSIUM 100 MG PO TABS
100.0000 mg | ORAL_TABLET | Freq: Every day | ORAL | 3 refills | Status: AC
  Filled 2023-09-28: qty 90, 90d supply, fill #0
  Filled 2024-01-13: qty 90, 90d supply, fill #1
  Filled 2024-04-01: qty 90, 90d supply, fill #2

## 2023-09-28 NOTE — Telephone Encounter (Signed)
 Call to patient to advise that Dr. Alvira Josephs and Dr. [name] spoke and agree that eliquis  shoule dbe stopped and  patient may resume Plavix  75 mg PO every day. A 90 day prescription with 1 refill has been sent in to pharmacy on file by Dr. Jory Ng office. Patient verbalizes understanding.

## 2023-09-28 NOTE — Telephone Encounter (Signed)
 Spoke to the patient, instructions given. S.Laderrick Wilk CCT

## 2023-09-28 NOTE — Addendum Note (Signed)
 Addended by: Cherylyn Cos on: 09/28/2023 10:42 AM   Modules accepted: Orders

## 2023-09-28 NOTE — Telephone Encounter (Signed)
-----   Message from Lovena Rubinstein sent at 09/26/2023  3:35 PM EDT ----- Per Dr. Alvira Josephs, patient may resume Plavix  75 mg PO every day.  A 90 day prescription with 1 refill has been sent in to pharmacy on file. Thank you.  Lambert Pillion, PA-C ----- Message ----- From: Jacqueline Matsu, MD Sent: 09/25/2023   4:57 PM EDT To: Luellen Sages, MD; Cherylyn Cos, RN  Needs to check with Dr. Alvira Josephs whether he wants her on Plavix  or ASA since we are stopping Eliquis  permanently ----- Message ----- From: Cherylyn Cos, RN Sent: 09/25/2023   4:42 PM EDT To: Jacqueline Matsu, MD

## 2023-09-29 ENCOUNTER — Other Ambulatory Visit (HOSPITAL_BASED_OUTPATIENT_CLINIC_OR_DEPARTMENT_OTHER): Payer: Self-pay

## 2023-10-02 ENCOUNTER — Other Ambulatory Visit: Payer: Self-pay | Admitting: Internal Medicine

## 2023-10-02 ENCOUNTER — Other Ambulatory Visit (HOSPITAL_BASED_OUTPATIENT_CLINIC_OR_DEPARTMENT_OTHER): Payer: Self-pay

## 2023-10-02 DIAGNOSIS — G4709 Other insomnia: Secondary | ICD-10-CM

## 2023-10-02 DIAGNOSIS — I5032 Chronic diastolic (congestive) heart failure: Secondary | ICD-10-CM

## 2023-10-02 DIAGNOSIS — E785 Hyperlipidemia, unspecified: Secondary | ICD-10-CM

## 2023-10-02 MED ORDER — AMOXICILLIN 500 MG PO CAPS
500.0000 mg | ORAL_CAPSULE | Freq: Three times a day (TID) | ORAL | 0 refills | Status: DC
  Filled 2023-10-02: qty 21, 7d supply, fill #0

## 2023-10-03 ENCOUNTER — Other Ambulatory Visit (HOSPITAL_BASED_OUTPATIENT_CLINIC_OR_DEPARTMENT_OTHER): Payer: Self-pay

## 2023-10-03 ENCOUNTER — Other Ambulatory Visit: Payer: Self-pay | Admitting: Internal Medicine

## 2023-10-03 ENCOUNTER — Other Ambulatory Visit (HOSPITAL_COMMUNITY)

## 2023-10-03 DIAGNOSIS — I5032 Chronic diastolic (congestive) heart failure: Secondary | ICD-10-CM

## 2023-10-03 DIAGNOSIS — E785 Hyperlipidemia, unspecified: Secondary | ICD-10-CM

## 2023-10-03 DIAGNOSIS — G4709 Other insomnia: Secondary | ICD-10-CM

## 2023-10-04 ENCOUNTER — Other Ambulatory Visit: Payer: Self-pay

## 2023-10-04 ENCOUNTER — Other Ambulatory Visit (HOSPITAL_BASED_OUTPATIENT_CLINIC_OR_DEPARTMENT_OTHER): Payer: Self-pay

## 2023-10-04 DIAGNOSIS — E785 Hyperlipidemia, unspecified: Secondary | ICD-10-CM

## 2023-10-04 DIAGNOSIS — I5032 Chronic diastolic (congestive) heart failure: Secondary | ICD-10-CM

## 2023-10-04 MED ORDER — ROSUVASTATIN CALCIUM 20 MG PO TABS
20.0000 mg | ORAL_TABLET | Freq: Every day | ORAL | 1 refills | Status: DC
  Filled 2023-10-04: qty 90, 90d supply, fill #0
  Filled 2024-01-13: qty 90, 90d supply, fill #1

## 2023-10-04 MED ORDER — POTASSIUM CHLORIDE ER 10 MEQ PO TBCR
10.0000 meq | EXTENDED_RELEASE_TABLET | Freq: Every day | ORAL | 1 refills | Status: DC
  Filled 2023-10-04: qty 90, 90d supply, fill #0
  Filled 2023-12-27 – 2024-01-13 (×2): qty 90, 90d supply, fill #1

## 2023-10-04 MED ORDER — TRAZODONE HCL 100 MG PO TABS
100.0000 mg | ORAL_TABLET | Freq: Every evening | ORAL | 1 refills | Status: DC
  Filled 2023-10-04: qty 90, 90d supply, fill #0

## 2023-10-08 ENCOUNTER — Other Ambulatory Visit (HOSPITAL_COMMUNITY): Payer: Self-pay | Admitting: Cardiology

## 2023-10-08 DIAGNOSIS — R0600 Dyspnea, unspecified: Secondary | ICD-10-CM

## 2023-10-09 ENCOUNTER — Ambulatory Visit: Payer: Self-pay | Admitting: Cardiology

## 2023-10-09 ENCOUNTER — Other Ambulatory Visit (HOSPITAL_COMMUNITY): Payer: Self-pay | Admitting: Cardiology

## 2023-10-09 ENCOUNTER — Ambulatory Visit (HOSPITAL_COMMUNITY)
Admission: RE | Admit: 2023-10-09 | Discharge: 2023-10-09 | Disposition: A | Discharge status: Home / Self Care | Source: Ambulatory Visit | Attending: Internal Medicine | Admitting: Internal Medicine

## 2023-10-09 DIAGNOSIS — R0600 Dyspnea, unspecified: Secondary | ICD-10-CM | POA: Insufficient documentation

## 2023-10-09 LAB — MYOCARDIAL PERFUSION IMAGING
LV dias vol: 74 mL (ref 46–106)
LV sys vol: 13 mL (ref 3.8–5.2)
Nuc Stress EF: 82 %
Peak HR: 78 {beats}/min
Rest HR: 76 {beats}/min
Rest Nuclear Isotope Dose: 10.5 mCi
SDS: 0
SRS: 6
SSS: 0
ST Depression (mm): 0 mm
Stress Nuclear Isotope Dose: 30.2 mCi
TID: 0.93

## 2023-10-09 MED ORDER — TECHNETIUM TC 99M TETROFOSMIN IV KIT
30.2000 | PACK | Freq: Once | INTRAVENOUS | Status: AC | PRN
  Administered 2023-10-09: 30.2 via INTRAVENOUS

## 2023-10-09 MED ORDER — REGADENOSON 0.4 MG/5ML IV SOLN
0.4000 mg | Freq: Once | INTRAVENOUS | Status: AC
  Administered 2023-10-09: 0.4 mg via INTRAVENOUS

## 2023-10-09 MED ORDER — TECHNETIUM TC 99M TETROFOSMIN IV KIT
10.5000 | PACK | Freq: Once | INTRAVENOUS | Status: AC | PRN
Start: 2023-10-09 — End: 2023-10-09
  Administered 2023-10-09: 10.5 via INTRAVENOUS

## 2023-10-09 MED ORDER — REGADENOSON 0.4 MG/5ML IV SOLN
INTRAVENOUS | Status: AC
  Filled 2023-10-09: qty 5

## 2023-10-11 ENCOUNTER — Other Ambulatory Visit (HOSPITAL_BASED_OUTPATIENT_CLINIC_OR_DEPARTMENT_OTHER): Payer: Self-pay

## 2023-10-11 MED ORDER — AMOXICILLIN 500 MG PO CAPS
500.0000 mg | ORAL_CAPSULE | Freq: Three times a day (TID) | ORAL | 0 refills | Status: AC
  Filled 2023-10-11: qty 21, 7d supply, fill #0

## 2023-10-13 ENCOUNTER — Encounter (HOSPITAL_COMMUNITY): Payer: Self-pay | Admitting: Interventional Radiology

## 2023-10-15 ENCOUNTER — Telehealth (HOSPITAL_COMMUNITY): Payer: Self-pay

## 2023-10-15 ENCOUNTER — Encounter: Payer: Self-pay | Admitting: Cardiology

## 2023-10-15 ENCOUNTER — Ambulatory Visit: Payer: Self-pay | Admitting: Cardiology

## 2023-10-15 NOTE — Telephone Encounter (Signed)
 Returned pt's call, no answer, left vm. AB

## 2023-10-16 ENCOUNTER — Ambulatory Visit (HOSPITAL_BASED_OUTPATIENT_CLINIC_OR_DEPARTMENT_OTHER)

## 2023-10-16 DIAGNOSIS — I480 Paroxysmal atrial fibrillation: Secondary | ICD-10-CM

## 2023-10-16 DIAGNOSIS — I5032 Chronic diastolic (congestive) heart failure: Secondary | ICD-10-CM | POA: Diagnosis not present

## 2023-10-16 LAB — ECHOCARDIOGRAM COMPLETE
Area-P 1/2: 5.38 cm2
S' Lateral: 2.21 cm

## 2023-10-17 ENCOUNTER — Other Ambulatory Visit: Payer: Self-pay

## 2023-10-17 NOTE — Telephone Encounter (Signed)
-----   Message from Susan Gillespie sent at 10/15/2023 10:12 AM EDT ----- Please let patient know that stress test was fine.  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

## 2023-10-17 NOTE — Telephone Encounter (Signed)
-----   Message from Wilbert Bihari sent at 10/15/2023 10:12 AM EDT ----- Please let patient know that stress test was fine.  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

## 2023-10-17 NOTE — Telephone Encounter (Signed)
 Patient calling back in regards to stress test results. She states she has had known calcifications and stenting of her left subclavian artery with Dr. Dolphus. She states she frequently checks her pulse in the that arm and has some chronic weakness but has not noticed any changes with the sensation or function of her left arm in some time.   Patient responses forwarded to Dr. Shlomo.

## 2023-10-17 NOTE — Telephone Encounter (Signed)
 Call back to patient to clarify who is seeing her for her subclavian artery stenosis. After review of chart and verifying with patient, patient acknowledges she has not been seen by either Dr. Dolphus or Dr. Rosemarie (neurologist who referred her to Dr. Dolphus in interventional radiology) in several years. Patient responses forwarded to Dr. Shlomo.

## 2023-10-17 NOTE — Telephone Encounter (Signed)
 Call to patient to discuss stress test results, no answer. No current DPR on file. Left message with no identifiers asking recipient to call [place] at our office #. [facility] sent.

## 2023-10-17 NOTE — Telephone Encounter (Signed)
-----   Message from Wilbert Bihari sent at 10/17/2023  4:34 PM EDT ----- Please get her back in with Dr. Dolphus for followup as there are not notes dating back to at least 2020 ----- Message ----- From: Janit Geni CROME, RN Sent: 10/17/2023   2:42 PM EDT To: Wilbert JONELLE Bihari, MD  ----- Message from Geni CROME Janit, RN sent at 10/17/2023  2:42 PM EDT -----   ----- Message ----- From: Bihari Wilbert JONELLE, MD Sent: 10/15/2023  10:12 AM EDT To: Glade JINNY Hope, MD; Geni CROME Janit, RN  Please let patient know that stress test was fine.  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

## 2023-10-22 ENCOUNTER — Encounter: Payer: Self-pay | Admitting: Cardiology

## 2023-10-24 ENCOUNTER — Other Ambulatory Visit: Payer: Self-pay

## 2023-10-24 DIAGNOSIS — I771 Stricture of artery: Secondary | ICD-10-CM

## 2023-10-24 NOTE — Progress Notes (Unsigned)
 Susan Gillespie [id number] February 14, 1946   Chief Complaint:  Referring Provider: Geofm Glade PARAS, MD Primary GI MD: Dr. Legrand  HPI: Susan Gillespie is a 78 y.o. female with past medical history of A-fib on Eliquis , CAD, HFpEF, HTN, HLD, GERD, left MCA stenosis s/p stenting x 2, sigmoid resection 2009 who presents today for hospital follow up.    Patient admitted to [facility] 09/15/2023 - 09/20/2023 for GI bleed.  She had presented to urgent care the day prior for evaluation of black stool and shortness of breath.  Patient was on chronic Plavix  after MCA stenting, had recently started Eliquis .  Also on aspirin .  Hemoglobin noted to be 6.9 on presentation, and she received 2 units PRBCs.   Seen by GI and underwent EGD 09/16/2023 with findings of erythematous mucosa in the stomach, doubtful clinical significance, and otherwise negative.   Colonoscopy 09/17/2023 showed pandiverticulosis, 3 sessile nonbleeding polyps in the rectum up to 7 mm in size not removed in setting of recent GI bleeding and Plavix , small amount of scattered hematin in the right colon lavaged. Given fluctuating hemoglobin, video capsule study was done which showed a nonbleeding small bowel AVM, which seemed like a less likely source of bleeding compared to more likely diverticular bleed.  Noted that patient will need follow-up colonoscopy in the outpatient setting after 5-day Plavix  washout for colon polyp removal, and after resolution of GI bleeding and improvement in anemia.   Previous GI Procedures/Imaging   Colonoscopy 09/17/2023 (Dr. Suzann) - Diverticulosis in the descending colon, in the transverse colon and in the ascending colon. Right- sided diverticular bleeding may explain the patient' s recent melena and acute blood loss anemia.  - Three 5 to 7 mm, non- bleeding polyps in the rectum, in the descending colon and in the transverse colon. Polyps were not removed in the setting of recent GI bleeding and incomplete  Plavix  washout.  - The examined portion of the ileum was normal.  - Internal hemorrhoids. - No specimens collected.  EGD 09/16/2023 (Dr. Legrand) - Normal esophagus.  - Erythematous mucosa in the stomach. Biopsied. Doubtful clinical significance.  - Normal examined duodenum.  - Normal examined jejunum.  2018 EGD and colonoscopy - Alm Angle, MD ( Surgery) Esophagus:   Normal GE junction at:  37 cm Stomach:  She has evidence of mild gastritis and a fair amount of bile reflux in her stomach.   Duodenum:   Normal to the 3rd portion of the duodenum.   Colonoscopy.  A digital rectal exam was done at the beginning of the procedure..  The anus and rectum were unremarkable.  The flexible Pentax colonoscope was passed up the rectum without difficulty.  The scope was advanced to the cecum and the ileocecal valve was identified.  The colonic prep was good.   The right colon, transverse colon, left colon, and sigmoid colon were unremarkable.  I could not identify my prior  resection/anastomosis, though this should have been at the 20 cm area.  Past Medical History:  Diagnosis Date   Anxiety    Aortic atherosclerosis (HCC)    Artery stenosis (HCC)    left internal carotid, left subclavian   Arthritis    Atherosclerosis of subclavian artery    Bleeding tendency (HCC) 05/16/2012   Hx post op bleeding (epidural hematoma s/p ACDF '10; LLQ hematoma due to superficial vessel fascial bleed post colon resection; Normal platelet count; saw Dr. Freddie '14   CAD in native artery  a. mod to mod-severe CAD by cath 10/2016, med rx.   Carotid stenosis, bilateral 08/31/2016   1-39% right and 50-69% lefft ICA stenosis dopplers 04/2019   Chronic diastolic CHF (congestive heart failure) (HCC)    Colon polyp    Diverticulitis    Family history of cardiovascular disease    Fracture of right fibula    GERD (gastroesophageal reflux disease)    Headache(784.0)    mirgraines - history of   Hearing aid worn     B/L   HOH (hard of hearing)    wears hearing aids   Hyperlipidemia    Hypertension    IBS (irritable bowel syndrome)    followed by Dr. Luis   Liver cyst    Melanoma (HCC)    removed from back 04/2013   PAF (paroxysmal atrial fibrillation) (HCC)    Shortness of breath dyspnea    with exertion   Stroke (HCC) 06/2012   mild   SVT (supraventricular tachycardia) (HCC)    Urinary urgency    UTI (lower urinary tract infection)    x 2 since June 2016   Wears glasses     Past Surgical History:  Procedure Laterality Date   ABDOMINAL HYSTERECTOMY     ANGIOPLASTY     stent in subclavian LEFT 2016, angioplasty of stent 2017   APPENDECTOMY     AUGMENTATION MAMMAPLASTY Bilateral    replaced silicone with saline 2004   BONE BIOPSY  09/16/2023   Procedure: BIOPSY, GI;  Surgeon: Legrand Victory LITTIE DOUGLAS, MD;  Location: [facility] ENDOSCOPY;  Service: Gastroenterology;;   BREAST EXCISIONAL BIOPSY Left    benign 1977   BREAST SURGERY     BIL breast augmentation   CHOLECYSTECTOMY     COLON SURGERY     Sigmoid Colectomy, returned for post op bleeding   COLONOSCOPY N/A 09/17/2023   Procedure: COLONOSCOPY;  Surgeon: Suzann Inocente HERO, MD;  Location: [facility] ENDOSCOPY;  Service: Gastroenterology;  Laterality: N/A;   COLONOSCOPY W/ POLYPECTOMY     COLONOSCOPY WITH PROPOFOL  N/A 05/05/2016   Procedure: COLONOSCOPY WITH PROPOFOL ;  Surgeon: Alm Angle, MD;  Location: [facility] ENDOSCOPY;  Service: General;  Laterality: N/A;   ESOPHAGOGASTRODUODENOSCOPY N/A 09/16/2023   Procedure: EGD (ESOPHAGOGASTRODUODENOSCOPY);  Surgeon: Legrand Victory LITTIE DOUGLAS, MD;  Location: [facility] ENDOSCOPY;  Service: Gastroenterology;  Laterality: N/A;   ESOPHAGOGASTRODUODENOSCOPY (EGD) WITH PROPOFOL  N/A 05/05/2016   Procedure: ESOPHAGOGASTRODUODENOSCOPY (EGD) WITH PROPOFOL ;  Surgeon: Alm Angle, MD;  Location: [facility];  Service: General;  Laterality: N/A;   EYE SURGERY     eyelid drooping fixed   GIVENS CAPSULE STUDY N/A 09/19/2023   Procedure: IMAGING  PROCEDURE, GI TRACT, INTRALUMINAL, VIA CAPSULE;  Surgeon: Suzann Inocente HERO, MD;  Location: [facility]OS[facility];  Service: Gastroenterology;  Laterality: N/A;   IR ANGIO INTRA EXTRACRAN SEL COM CAROTID INNOMINATE BILAT MOD SED  10/16/2018   IR ANGIO VERTEBRAL SEL SUBCLAVIAN INNOMINATE UNI L MOD SED  10/16/2018   IR ANGIO VERTEBRAL SEL VERTEBRAL UNI R MOD SED  10/16/2018   IR GENERIC HISTORICAL  12/24/2015   IR ANGIO INTRA EXTRACRAN SEL COM CAROTID INNOMINATE BILAT MOD SED 12/24/2015 Thyra Nash, MD [facility]   IR GENERIC HISTORICAL  12/24/2015   IR ANGIOGRAM EXTREMITY LEFT 12/24/2015 Thyra Nash, MD [facility]   IR GENERIC HISTORICAL  12/24/2015   IR ANGIO VERTEBRAL SEL VERTEBRAL UNI R MOD SED 12/24/2015 Thyra Nash, MD [facility]   IR GENERIC HISTORICAL  01/05/2016   IR PTA NON CORO-LOWER EXTREM  01/05/2016 Thyra Nash, MD [facility]   IR GENERIC HISTORICAL  03/07/2016   IR RADIOLOGIST EVAL & MGMT 03/07/2016 [facility]   IR RADIOLOGIST EVAL & MGMT  11/30/2022   JOINT REPLACEMENT Left    thumb   LEFT HEART CATH AND CORONARY ANGIOGRAPHY N/A 11/10/2016   Procedure: Left Heart Cath and Coronary Angiography;  Surgeon: Claudene Victory ORN, MD;  Location: [facility] INVASIVE CV LAB;  Service: Cardiovascular;  Laterality: N/A;   MASTOID DEBRIDEMENT     MASTOIDECTOMY REVISION     RADIOLOGY WITH ANESTHESIA N/A 11/09/2014   Procedure: STENT PLACEMENT;  Surgeon: Thyra Nash, MD;  Location: [facility] OR;  Service: Radiology;  Laterality: N/A;   RADIOLOGY WITH ANESTHESIA N/A 05/31/2015   Procedure: RADIOLOGY WITH ANESTHESIA;  Surgeon: Thyra Nash, MD;  Location: [facility] OR;  Service: Radiology;  Laterality: N/A;   RADIOLOGY WITH ANESTHESIA N/A 01/05/2016   Procedure: RADIOLOGY WITH ANESTHESIA ANGIOPLASTY WITH STENTNG;  Surgeon: Thyra Nash, MD;  Location: [facility] OR;  Service: Radiology;  Laterality: N/A;   RADIOLOGY WITH ANESTHESIA N/A 10/16/2018   Procedure: STENTING;  Surgeon: Nash Thyra, MD;  Location: [facility] OR;  Service: Radiology;  Laterality: N/A;   skin cancer excised  04/2013   Melonoma   SPINE SURGERY     cervical fusion, returned to OR for post op  bleeding   TONSILLECTOMY     TONSILLECTOMY     TUBAL LIGATION     VAGINA SURGERY     anterior posterior repair    Current Outpatient Medications  Medication Sig Dispense Refill   ALPRAZolam  (XANAX ) 0.5 MG tablet Take ONE-HALF to 1 tablets (0.25-0.5 mg total) by mouth at bedtime as needed (Patient taking differently: Take 0.25-0.5 mg by mouth at bedtime as needed for anxiety.) 30 tablet 5   ascorbic acid (VITAMIN C) 500 MG tablet Take 500 mg by mouth daily.     aspirin  EC 81 MG tablet Take 1 tablet (81 mg total) by mouth daily. Swallow whole. 30 tablet 12   cetirizine (ZYRTEC) 10 MG tablet Take 10 mg by mouth every morning.     clopidogrel  (PLAVIX ) 75 MG tablet Take 1 tablet (75 mg total) by mouth daily. 90 tablet 2   diltiazem  (CARDIZEM  CD) 180 MG 24 hr capsule Take 1 capsule (180 mg total) by mouth at bedtime. 30 capsule 0   estradiol  (ESTRACE ) 0.1 MG/GM vaginal cream Place 1 Applicatorful vaginally 2 (two) times a week. 127.5 g 4   famotidine  (PEPCID ) 40 MG tablet Take 1 tablet (40 mg total) by mouth daily. (Patient taking differently: Take 40 mg by mouth every evening.) 90 tablet 2   ferrous sulfate  325 (65 FE) MG tablet Take 325 mg by mouth daily with breakfast.     fluticasone  (FLONASE ) 50 MCG/ACT nasal spray Place 1 spray into both nostrils daily as needed for allergies.     furosemide  (LASIX ) 20 MG tablet TAKE 1 TABLET (20 MG TOTAL) BY MOUTH DAILY. MAY TAKE AN ADDITIONAL TABLET FOR EDEMA 135 tablet 2   losartan  (COZAAR ) 100 MG tablet Take 1 tablet (100 mg total) by mouth daily. 90 tablet 3   methocarbamol  (ROBAXIN ) 500 MG tablet TAKE 1 TABLET (500 MG TOTAL) BY MOUTH DAILY AS NEEDED FOR MUSCLE SPASMS. 60 tablet 2   metoprolol  succinate (TOPROL -XL) 50 MG 24 hr tablet Take 50 mg by mouth as needed (svt). Take  with or immediately following a meal. -     mirabegron  ER (MYRBETRIQ ) 50 MG TB24 tablet Take  1 tablet (50 mg total) by mouth daily. (Patient taking differently: Take 25 mg by mouth 2 (two) times daily.) 90 tablet 2   nitroGLYCERIN  (NITROSTAT ) 0.4 MG SL tablet Place 0.4 mg under the tongue every 5 (five) minutes as needed for chest pain.     Omega-3 Fatty Acids (FISH OIL PO) Take 1 tablet by mouth daily.     oxyCODONE -acetaminophen  (PERCOCET/ROXICET) 5-325 MG tablet Take 0.5 tablets by mouth every 6 (six) hours as needed for moderate pain (pain score 4-6).     potassium chloride  (KLOR-CON ) 10 MEQ tablet Take 1 tablet (10 mEq total) by mouth daily. 90 tablet 1   rosuvastatin  (CRESTOR ) 20 MG tablet Take 1 tablet (20 mg total) by mouth daily. 90 tablet 1   traZODone  (DESYREL ) 100 MG tablet TAKE 1 TABLET AT BEDTIME. PER MD RETURN IN ABOUT 6 MONTHS (AROUND 11/22/2022) FOR PHYSICAL EXAM. 90 tablet 1   traZODone  (DESYREL ) 100 MG tablet Take 1 tablet (100 mg total) by mouth at bedtime. 90 tablet 1   vitamin B-12 (CYANOCOBALAMIN ) 500 MCG tablet Take 500 mcg by mouth daily.     No current facility-administered medications for this visit.    Allergies as of 10/25/2023 - Review Complete 10/09/2023  Allergen Reaction Noted   Phenergan  [promethazine  hcl] Other (See Comments) 10/10/2018   Daypro [oxaprozin] Swelling 09/12/2011   Lisinopril   09/25/2011   Oxycodone  Itching 04/16/2020   Simvastatin  04/28/2009   Tramadol   10/29/2018    Family History  Problem Relation Age of Onset   Heart disease Father    Heart disease Sister    Heart attack Sister    Heart disease Brother    Heart attack Brother    Stroke Son    Hyperlipidemia Other    Hypertension Other    Cancer Other        lung, esophagus, stomach   Stroke Other    Breast cancer Neg Hx    BRCA 1/2 Neg Hx     Social History   Tobacco Use   Smoking status: Former    Current packs/day: 0.00    Average packs/day: 2.0 packs/day for 22.0  years (44.0 ttl pk-yrs)    Types: Cigarettes    Start date: 04/24/1963    Quit date: 04/23/1985    Years since quitting: 38.5   Smokeless tobacco: Never  Vaping Use   Vaping status: Never Used  Substance Use Topics   Alcohol use: Yes    Alcohol/week: 4.0 standard drinks of alcohol    Types: 4 Glasses of wine per week    Comment: 1 drink a  day   Drug use: No     Review of Systems:    Constitutional: No weight loss, fever, chills, weakness or fatigue Eyes: No change in vision Ears, Nose, Throat:  No change in hearing or congestion Skin: No rash or itching Cardiovascular: No chest pain, chest pressure or palpitations   Respiratory: No SOB or cough Gastrointestinal: See HPI and otherwise negative Genitourinary: No dysuria or change in urinary frequency Neurological: No headache, dizziness or syncope Musculoskeletal: No new muscle or joint pain Hematologic: No bleeding or bruising    Physical Exam:  Vital signs: LMP  (LMP Unknown)   Constitutional: NAD, Well developed, Well nourished, alert and cooperative Head:  Normocephalic and atraumatic.  Eyes: No scleral icterus. Conjunctiva pink. Mouth: No oral lesions. Respiratory: Respirations even and unlabored. Lungs clear to auscultation bilaterally.  No wheezes, crackles, or rhonchi.  Cardiovascular:  Regular rate  and rhythm. No murmurs. No peripheral edema. Gastrointestinal:  Soft, nondistended, nontender. No rebound or guarding. Normal bowel sounds. No appreciable masses or hepatomegaly. Rectal:  Not performed.  Neurologic:  Alert and oriented x4;  grossly normal neurologically.  Skin:   Dry and intact without significant lesions or rashes. Psychiatric: Oriented to person, place and time. Demonstrates good judgement and reason without abnormal affect or behaviors.   RELEVANT LABS AND IMAGING: CBC    Component Value Date/Time   WBC 6.4 09/26/2023 1123   RBC 2.93 (L) 09/26/2023 1123   HGB 9.3 (L) 09/26/2023 1123   HGB 12.2  11/07/2016 1230   HGB 11.8 03/19/2013 0909   HCT 30.0 (L) 09/26/2023 1123   HCT 35.5 11/07/2016 1230   HCT 34.9 03/19/2013 0909   PLT 373 09/26/2023 1123   PLT 265 11/07/2016 1230   MCV 102.4 (H) 09/26/2023 1123   MCV 95 11/07/2016 1230   MCV 95.4 03/19/2013 0909   MCH 31.7 09/26/2023 1123   MCHC 31.0 (L) 09/26/2023 1123   RDW 13.7 09/26/2023 1123   RDW 13.3 11/07/2016 1230   RDW 13.0 03/19/2013 0909   LYMPHSABS 0.8 05/23/2023 1010   LYMPHSABS 1.4 03/19/2013 0909   MONOABS 0.5 05/23/2023 1010   MONOABS 0.4 03/19/2013 0909   EOSABS 243 09/26/2023 1123   EOSABS 0.2 03/19/2013 0909   BASOSABS 19 09/26/2023 1123   BASOSABS 0.0 03/19/2013 0909    CMP     Component Value Date/Time   NA 138 09/20/2023 0640   NA 143 01/25/2022 1405   K 4.0 09/20/2023 0640   CL 109 09/20/2023 0640   CO2 23 09/20/2023 0640   GLUCOSE 107 (H) 09/20/2023 0640   BUN 10 09/20/2023 0640   BUN 16 01/25/2022 1405   CREATININE 0.93 09/20/2023 0640   CREATININE 1.01 (H) 11/11/2019 0856   CALCIUM  9.0 09/20/2023 0640   PROT 6.4 (L) 06/24/2023 2319   PROT 6.3 08/31/2016 0912   ALBUMIN 4.4 06/24/2023 2319   ALBUMIN 4.4 08/31/2016 0912   AST 18 06/24/2023 2319   ALT 13 06/24/2023 2319   ALKPHOS 56 06/24/2023 2319   BILITOT 0.2 06/24/2023 2319   BILITOT 0.4 08/31/2016 0912   GFRNONAA >60 09/20/2023 0640   GFRAA >60 10/16/2018 0647   Echocardiogram 10/16/2023 1. Left ventricular ejection fraction, by estimation, is 60 to 65% . The left ventricle has normal function. The left ventricle has no regional wall motion abnormalities. Left ventricular diastolic parameters are consistent with Grade II diastolic dysfunction ( pseudonormalization) . The average left ventricular global longitudinal strain is - 20. 6 % . The global longitudinal strain is normal.  2. Right ventricular systolic function is normal. The right ventricular size is normal. There is moderately elevated pulmonary artery systolic pressure. The  estimated right ventricular systolic pressure is 47. 9 mmHg.  3. Left atrial size was moderately dilated.  4. Right atrial size was mildly dilated.  5. The mitral valve is normal in structure. Mild to moderate mitral valve regurgitation. No evidence of mitral stenosis.  6. Tricuspid valve regurgitation is mild to moderate.  7. The aortic valve is tricuspid. Aortic valve regurgitation is not visualized. No aortic stenosis is present.  8. The inferior vena cava is normal in size with greater than 50% respiratory variability, suggesting right atrial pressure of 3 mmHg.  Assessment/Plan:       Camie Furbish, PA-C [facility] 10/24/2023, 8:01 PM  Patient Care Team: Geofm Glade PARAS, MD as PCP - General (  Internal Medicine) Shlomo Wilbert SAUNDERS, MD as PCP - Cardiology (Cardiology) Debarah Lorrene DEL., MD as Consulting Physician (Ophthalmology)

## 2023-10-24 NOTE — Progress Notes (Signed)
 Per [facility] message 10/22/23, patient requests to follow up with Dr. Darron as she has chronic symptoms. Referral placed.

## 2023-10-25 ENCOUNTER — Other Ambulatory Visit (HOSPITAL_BASED_OUTPATIENT_CLINIC_OR_DEPARTMENT_OTHER): Payer: Self-pay

## 2023-10-25 ENCOUNTER — Other Ambulatory Visit

## 2023-10-25 ENCOUNTER — Telehealth: Payer: Self-pay

## 2023-10-25 ENCOUNTER — Ambulatory Visit: Admitting: Gastroenterology

## 2023-10-25 ENCOUNTER — Encounter: Payer: Self-pay | Admitting: Gastroenterology

## 2023-10-25 VITALS — BP 132/58 | HR 68 | Ht 63.0 in | Wt 144.5 lb

## 2023-10-25 DIAGNOSIS — Z5181 Encounter for therapeutic drug level monitoring: Secondary | ICD-10-CM | POA: Diagnosis not present

## 2023-10-25 DIAGNOSIS — Z7901 Long term (current) use of anticoagulants: Secondary | ICD-10-CM

## 2023-10-25 DIAGNOSIS — D649 Anemia, unspecified: Secondary | ICD-10-CM

## 2023-10-25 DIAGNOSIS — Z860100 Personal history of colon polyps, unspecified: Secondary | ICD-10-CM

## 2023-10-25 DIAGNOSIS — K5790 Diverticulosis of intestine, part unspecified, without perforation or abscess without bleeding: Secondary | ICD-10-CM

## 2023-10-25 DIAGNOSIS — Z8719 Personal history of other diseases of the digestive system: Secondary | ICD-10-CM

## 2023-10-25 LAB — IBC + FERRITIN
Ferritin: 19.3 ng/mL (ref 10.0–291.0)
Iron: 54 ug/dL (ref 42–145)
Saturation Ratios: 16.2 % — ABNORMAL LOW (ref 20.0–50.0)
TIBC: 333.2 ug/dL (ref 250.0–450.0)
Transferrin: 238 mg/dL (ref 212.0–360.0)

## 2023-10-25 LAB — CBC WITH DIFFERENTIAL/PLATELET
Basophils Absolute: 0 K/uL (ref 0.0–0.1)
Basophils Relative: 0.7 % (ref 0.0–3.0)
Eosinophils Absolute: 0.1 K/uL (ref 0.0–0.7)
Eosinophils Relative: 2.9 % (ref 0.0–5.0)
HCT: 32.1 % — ABNORMAL LOW (ref 36.0–46.0)
Hemoglobin: 10.6 g/dL — ABNORMAL LOW (ref 12.0–15.0)
Lymphocytes Relative: 18.8 % (ref 12.0–46.0)
Lymphs Abs: 0.8 K/uL (ref 0.7–4.0)
MCHC: 33.1 g/dL (ref 30.0–36.0)
MCV: 99.4 fl (ref 78.0–100.0)
Monocytes Absolute: 0.4 K/uL (ref 0.1–1.0)
Monocytes Relative: 9.5 % (ref 3.0–12.0)
Neutro Abs: 3.1 K/uL (ref 1.4–7.7)
Neutrophils Relative %: 68.1 % (ref 43.0–77.0)
Platelets: 275 K/uL (ref 150.0–400.0)
RBC: 3.23 Mil/uL — ABNORMAL LOW (ref 3.87–5.11)
RDW: 14.9 % (ref 11.5–15.5)
WBC: 4.5 K/uL (ref 4.0–10.5)

## 2023-10-25 LAB — COMPREHENSIVE METABOLIC PANEL WITH GFR
ALT: 12 U/L (ref 0–35)
AST: 16 U/L (ref 0–37)
Albumin: 4.3 g/dL (ref 3.5–5.2)
Alkaline Phosphatase: 58 U/L (ref 39–117)
BUN: 19 mg/dL (ref 6–23)
CO2: 30 meq/L (ref 19–32)
Calcium: 9.8 mg/dL (ref 8.4–10.5)
Chloride: 103 meq/L (ref 96–112)
Creatinine, Ser: 1.2 mg/dL (ref 0.40–1.20)
GFR: 43.04 mL/min — ABNORMAL LOW (ref >60.00)
Glucose, Bld: 92 mg/dL (ref 70–99)
Potassium: 4 meq/L (ref 3.5–5.1)
Sodium: 140 meq/L (ref 135–145)
Total Bilirubin: 0.3 mg/dL (ref 0.2–1.2)
Total Protein: 6.4 g/dL (ref 6.0–8.3)

## 2023-10-25 MED ORDER — NA SULFATE-K SULFATE-MG SULF 17.5-3.13-1.6 GM/177ML PO SOLN
1.0000 | Freq: Once | ORAL | 0 refills | Status: AC
  Filled 2023-10-25 – 2023-11-06 (×2): qty 354, 1d supply, fill #0

## 2023-10-25 NOTE — Telephone Encounter (Signed)
 [facility] Pre-operative Risk Assessment     Request for surgical clearance:     Endoscopy Procedure  What type of surgery is being performed?     Colonoscopy  When is this surgery scheduled?     12/27/23  What type of clearance is required ?   Pharmacy  Are there any medications that need to be held prior to surgery and how long? Plavix  5 days  Practice name and name of physician performing surgery?      [facility]  What is your office phone and fax number?      Phone- [phone]  Fax- [phone]  Anesthesia type (None, local, MAC, general) ?       MAC   Please route your response to [employer], CMA

## 2023-10-25 NOTE — Patient Instructions (Signed)
 Your provider has requested that you go to the basement level for lab work before leaving today. Press B on the elevator. The lab is located at the first door on the left as you exit the elevator.  Due to recent changes in healthcare laws, you may see the results of your imaging and laboratory studies on MyChart before your provider has had a chance to review them.  We understand that in some cases there may be results that are confusing or concerning to you. Not all laboratory results come back in the same time frame and the provider may be waiting for multiple results in order to interpret others.  Please give us  48 hours in order for your provider to thoroughly review all the results before contacting the office for clarification of your results.    You have been scheduled for a colonoscopy. Please follow written instructions given to you at your visit today.   If you use inhalers (even only as needed), please bring them with you on the day of your procedure.  DO NOT TAKE 7 DAYS PRIOR TO TEST- Trulicity (dulaglutide) Ozempic, Wegovy (semaglutide) Mounjaro (tirzepatide) Bydureon Bcise (exanatide extended release)  DO NOT TAKE 1 DAY PRIOR TO YOUR TEST Rybelsus (semaglutide) Adlyxin (lixisenatide) Victoza (liraglutide) Byetta (exanatide) ___________________________________________________________________________   Thank you for trusting me with your gastrointestinal care!   Camie Furbish, PA-C  _______________________________________________________  If your blood pressure at your visit was 140/90 or greater, please contact your primary care physician to follow up on this.  _______________________________________________________  If you are age 80 or older, your body mass index should be between 23-30. Your Body mass index is 25.6 kg/m. If this is out of the aforementioned range listed, please consider follow up with your Primary Care Provider.  If you are age 76 or younger, your  body mass index should be between 19-25. Your Body mass index is 25.6 kg/m. If this is out of the aformentioned range listed, please consider follow up with your Primary Care Provider.   ________________________________________________________  The [facility] providers would like to encourage you to use MYCHART to communicate with providers for non-urgent requests or questions.  Due to long hold times on the telephone, sending your provider a message by [facility] may be a faster and more efficient way to get a response.  Please allow 48 business hours for a response.  Please remember that this is for non-urgent requests.  _______________________________________________________

## 2023-10-26 NOTE — Telephone Encounter (Signed)
 It appears patient was placed on Plavix  by interventional radiology service per Dr. Shlomo.  Will remove this request from cardiology pool.

## 2023-10-28 NOTE — Progress Notes (Signed)
 ____________________________________________________________  Attending physician addendum:  Thank you for sending this case to me. I have reviewed the entire note and agree with the plan.   Amada Jupiter, MD  ____________________________________________________________

## 2023-10-29 ENCOUNTER — Ambulatory Visit: Payer: Self-pay | Admitting: Gastroenterology

## 2023-10-29 NOTE — Telephone Encounter (Signed)
   [facility] [address] [place], [place]  [address] Phone:  [phone]   Fax:  [phone]    Susan Gillespie Los 03/31/1946 [id number]  10/29/2023   Dear Dr. Thyra Nash:  We have scheduled the above named patient for a colonoscopy procedure. Our records show that (s)he is on anticoagulation therapy.  Please advise as to whether the patient may come off their therapy of Plavix  5 days prior to their procedure which is scheduled for 12/27/23.  Please route your response to [facility], CMA or fax response to [phone].  Sincerely,    [facility]

## 2023-10-29 NOTE — Telephone Encounter (Signed)
 Letter faxed to [phone]

## 2023-10-29 NOTE — Telephone Encounter (Signed)
   [facility] [address] [place], [place]  [address] Phone:  [phone]   Fax:  [phone]    Susan Gillespie [birth year] [id number]  10/29/23   Dear Carlin A. Carim, PA-C:  We have scheduled the above named patient for a colonoscopy procedure. Our records show that (s)he is on anticoagulation therapy.  Please advise as to whether the patient may come off their therapy of Plavix  5 days prior to their procedure which is scheduled for 12/27/23.  Please route your response to [facility], CMA or fax response to [phone].  Sincerely,    [facility]

## 2023-11-05 ENCOUNTER — Encounter: Payer: Self-pay | Admitting: Cardiology

## 2023-11-05 ENCOUNTER — Other Ambulatory Visit (HOSPITAL_BASED_OUTPATIENT_CLINIC_OR_DEPARTMENT_OTHER): Payer: Self-pay

## 2023-11-05 MED ORDER — DILTIAZEM HCL ER COATED BEADS 180 MG PO CP24
180.0000 mg | ORAL_CAPSULE | Freq: Every day | ORAL | 3 refills | Status: AC
  Filled 2023-11-05: qty 90, 90d supply, fill #0
  Filled 2024-01-13: qty 90, 90d supply, fill #1
  Filled 2024-05-18: qty 90, 90d supply, fill #2

## 2023-11-06 ENCOUNTER — Other Ambulatory Visit (HOSPITAL_BASED_OUTPATIENT_CLINIC_OR_DEPARTMENT_OTHER): Payer: Self-pay

## 2023-11-14 NOTE — Telephone Encounter (Signed)
 Re faxed clearance request to [phone] and [phone].

## 2023-11-15 NOTE — Telephone Encounter (Signed)
 Lmtcb

## 2023-11-15 NOTE — Telephone Encounter (Signed)
 Susan Gillespie called and advised that Dr. Dolphus said that it was ok to hold the Plavix  for 5 days prior to her procedure but she will need to remain on her Aspirin .  She can resume the Plavix  the day after her colonoscopy.  She said that she will fax us  a note as well.

## 2023-11-16 NOTE — Telephone Encounter (Signed)
 I spoke to [facility] and I advised her that Dr. Dolphus approved the 5 day hold for her Plavix  but he wants her to take Aspirin  during that time.  I advised her that He said that she can restart the day after but Dr. Legrand will also remind her when to restart the Plavix .  Patient agreed with the plan of care.

## 2023-11-16 NOTE — Telephone Encounter (Signed)
 Patient is calling back requesting to speak to [facility]. Patient would like a call back .Please advise.

## 2023-11-19 ENCOUNTER — Encounter: Payer: Self-pay | Admitting: Internal Medicine

## 2023-11-19 NOTE — Patient Instructions (Addendum)
      Blood work was ordered.       Medications changes include :   None    A referral was ordered and someone will call you to schedule an appointment.     Return in about 6 months (around 05/22/2024) for Physical Exam.

## 2023-11-19 NOTE — Progress Notes (Unsigned)
 Subjective:    Patient ID: Susan Gillespie, female    DOB: 27-Sep-1945, 78 y.o.   MRN: [id number]     HPI Susan Gillespie is here for follow up of her chronic medical problems.  Could hold off on labs or ? Recheck anemia / iron   Medications and allergies reviewed with patient and updated if appropriate.  Current Outpatient Medications on File Prior to Visit  Medication Sig Dispense Refill   ALPRAZolam  (XANAX ) 0.5 MG tablet Take ONE-HALF to 1 tablets (0.25-0.5 mg total) by mouth at bedtime as needed 30 tablet 5   ascorbic acid (VITAMIN C) 500 MG tablet Take 500 mg by mouth daily.     aspirin  EC 81 MG tablet Take 1 tablet (81 mg total) by mouth daily. Swallow whole. 30 tablet 12   cetirizine (ZYRTEC) 10 MG tablet Take 10 mg by mouth every morning.     clopidogrel  (PLAVIX ) 75 MG tablet Take 1 tablet (75 mg total) by mouth daily. 90 tablet 2   diltiazem  (CARDIZEM  CD) 180 MG 24 hr capsule Take 1 capsule (180 mg total) by mouth at bedtime. Patient needs appointment with Cardiology [phone] for further refills. 90 capsule 3   estradiol  (ESTRACE ) 0.1 MG/GM vaginal cream Place 1 Applicatorful vaginally 2 (two) times a week. 127.5 g 4   famotidine  (PEPCID ) 40 MG tablet Take 1 tablet (40 mg total) by mouth daily. 90 tablet 2   ferrous sulfate  325 (65 FE) MG tablet Take 325 mg by mouth daily with breakfast.     fluticasone  (FLONASE ) 50 MCG/ACT nasal spray Place 1 spray into both nostrils daily as needed for allergies.     furosemide  (LASIX ) 20 MG tablet TAKE 1 TABLET (20 MG TOTAL) BY MOUTH DAILY. MAY TAKE AN ADDITIONAL TABLET FOR EDEMA 135 tablet 2   losartan  (COZAAR ) 100 MG tablet Take 1 tablet (100 mg total) by mouth daily. 90 tablet 3   methocarbamol  (ROBAXIN ) 500 MG tablet TAKE 1 TABLET (500 MG TOTAL) BY MOUTH DAILY AS NEEDED FOR MUSCLE SPASMS. 60 tablet 2   metoprolol  succinate (TOPROL -XL) 50 MG 24 hr tablet Take 50 mg by mouth as needed (svt). Take with or immediately following a meal. -      mirabegron  ER (MYRBETRIQ ) 50 MG TB24 tablet Take 1 tablet (50 mg total) by mouth daily. 90 tablet 2   nitroGLYCERIN  (NITROSTAT ) 0.4 MG SL tablet Place 0.4 mg under the tongue every 5 (five) minutes as needed for chest pain.     Omega-3 Fatty Acids (FISH OIL PO) Take 1 tablet by mouth daily.     oxyCODONE -acetaminophen  (PERCOCET/ROXICET) 5-325 MG tablet Take 0.5 tablets by mouth every 6 (six) hours as needed for moderate pain (pain score 4-6).     potassium chloride  (KLOR-CON ) 10 MEQ tablet Take 1 tablet (10 mEq total) by mouth daily. 90 tablet 1   rosuvastatin  (CRESTOR ) 20 MG tablet Take 1 tablet (20 mg total) by mouth daily. 90 tablet 1   traZODone  (DESYREL ) 100 MG tablet Take 1 tablet (100 mg total) by mouth at bedtime. 90 tablet 1   vitamin B-12 (CYANOCOBALAMIN ) 500 MCG tablet Take 500 mcg by mouth daily.     No current facility-administered medications on file prior to visit.     Review of Systems     Objective:  There were no vitals filed for this visit. BP Readings from Last 3 Encounters:  10/25/23 (!) 132/58  09/26/23 (!) 130/56  09/20/23 (!) 143/54  Wt Readings from Last 3 Encounters:  10/25/23 144 lb 8 oz (65.5 kg)  09/26/23 141 lb (64 kg)  09/19/23 147 lb 11.3 oz (67 kg)   There is no height or weight on file to calculate BMI.    Physical Exam     Lab Results  Component Value Date   WBC 4.5 10/25/2023   HGB 10.6 (L) 10/25/2023   HCT 32.1 (L) 10/25/2023   PLT 275.0 10/25/2023   GLUCOSE 92 10/25/2023   CHOL 137 05/23/2023   TRIG 144.0 05/23/2023   HDL 70.50 05/23/2023   LDLDIRECT 50.0 10/30/2018   LDLCALC 37 05/23/2023   ALT 12 10/25/2023   AST 16 10/25/2023   NA 140 10/25/2023   K 4.0 10/25/2023   CL 103 10/25/2023   CREATININE 1.20 10/25/2023   BUN 19 10/25/2023   CO2 30 10/25/2023   TSH 1.51 05/23/2023   INR 0.9 06/24/2023   HGBA1C 6.3 05/23/2023     Assessment & Plan:    See Problem List for Assessment and Plan of chronic medical problems.

## 2023-11-20 ENCOUNTER — Encounter: Payer: Self-pay | Admitting: Internal Medicine

## 2023-11-20 ENCOUNTER — Other Ambulatory Visit: Payer: Self-pay

## 2023-11-20 ENCOUNTER — Ambulatory Visit: Payer: Self-pay | Admitting: Internal Medicine

## 2023-11-20 ENCOUNTER — Ambulatory Visit: Admitting: Internal Medicine

## 2023-11-20 ENCOUNTER — Other Ambulatory Visit (HOSPITAL_BASED_OUTPATIENT_CLINIC_OR_DEPARTMENT_OTHER): Payer: Self-pay

## 2023-11-20 ENCOUNTER — Other Ambulatory Visit: Payer: Self-pay | Admitting: Internal Medicine

## 2023-11-20 VITALS — BP 136/60 | HR 71 | Temp 98.3°F | Ht 63.0 in | Wt 144.0 lb

## 2023-11-20 DIAGNOSIS — G4709 Other insomnia: Secondary | ICD-10-CM

## 2023-11-20 DIAGNOSIS — I2510 Atherosclerotic heart disease of native coronary artery without angina pectoris: Secondary | ICD-10-CM | POA: Diagnosis not present

## 2023-11-20 DIAGNOSIS — I10 Essential (primary) hypertension: Secondary | ICD-10-CM

## 2023-11-20 DIAGNOSIS — G8929 Other chronic pain: Secondary | ICD-10-CM

## 2023-11-20 DIAGNOSIS — E785 Hyperlipidemia, unspecified: Secondary | ICD-10-CM

## 2023-11-20 DIAGNOSIS — G2581 Restless legs syndrome: Secondary | ICD-10-CM | POA: Diagnosis not present

## 2023-11-20 DIAGNOSIS — R7303 Prediabetes: Secondary | ICD-10-CM | POA: Diagnosis not present

## 2023-11-20 DIAGNOSIS — K588 Other irritable bowel syndrome: Secondary | ICD-10-CM | POA: Diagnosis not present

## 2023-11-20 DIAGNOSIS — M150 Primary generalized (osteo)arthritis: Secondary | ICD-10-CM

## 2023-11-20 DIAGNOSIS — N3289 Other specified disorders of bladder: Secondary | ICD-10-CM | POA: Diagnosis not present

## 2023-11-20 DIAGNOSIS — I5032 Chronic diastolic (congestive) heart failure: Secondary | ICD-10-CM | POA: Diagnosis not present

## 2023-11-20 DIAGNOSIS — M545 Low back pain, unspecified: Secondary | ICD-10-CM

## 2023-11-20 DIAGNOSIS — D62 Acute posthemorrhagic anemia: Secondary | ICD-10-CM | POA: Diagnosis not present

## 2023-11-20 DIAGNOSIS — F418 Other specified anxiety disorders: Secondary | ICD-10-CM

## 2023-11-20 LAB — CBC WITH DIFFERENTIAL/PLATELET
Basophils Absolute: 0 K/uL (ref 0.0–0.1)
Basophils Relative: 0.7 % (ref 0.0–3.0)
Eosinophils Absolute: 0.1 K/uL (ref 0.0–0.7)
Eosinophils Relative: 3.2 % (ref 0.0–5.0)
HCT: 34.4 % — ABNORMAL LOW (ref 36.0–46.0)
Hemoglobin: 11.7 g/dL — ABNORMAL LOW (ref 12.0–15.0)
Lymphocytes Relative: 17.3 % (ref 12.0–46.0)
Lymphs Abs: 0.8 K/uL (ref 0.7–4.0)
MCHC: 33.9 g/dL (ref 30.0–36.0)
MCV: 97.5 fl (ref 78.0–100.0)
Monocytes Absolute: 0.5 K/uL (ref 0.1–1.0)
Monocytes Relative: 10.2 % (ref 3.0–12.0)
Neutro Abs: 3.1 K/uL (ref 1.4–7.7)
Neutrophils Relative %: 68.6 % (ref 43.0–77.0)
Platelets: 265 K/uL (ref 150.0–400.0)
RBC: 3.53 Mil/uL — ABNORMAL LOW (ref 3.87–5.11)
RDW: 13.6 % (ref 11.5–15.5)
WBC: 4.5 K/uL (ref 4.0–10.5)

## 2023-11-20 LAB — HEMOGLOBIN A1C: Hgb A1c MFr Bld: 5.6 % (ref 4.6–6.5)

## 2023-11-20 MED ORDER — TRAZODONE HCL 100 MG PO TABS
150.0000 mg | ORAL_TABLET | Freq: Every evening | ORAL | 1 refills | Status: AC
  Filled 2023-11-20 – 2023-12-11 (×18): qty 135, 90d supply, fill #0
  Filled 2024-01-13 – 2024-03-19 (×2): qty 135, 90d supply, fill #1

## 2023-11-20 MED ORDER — METHOCARBAMOL 500 MG PO TABS
500.0000 mg | ORAL_TABLET | Freq: Every day | ORAL | 5 refills | Status: AC | PRN
  Filled 2023-11-20: qty 60, 60d supply, fill #0
  Filled 2024-01-13: qty 60, 60d supply, fill #1
  Filled 2024-03-10: qty 60, 60d supply, fill #2
  Filled 2024-05-18: qty 60, 60d supply, fill #3

## 2023-11-20 NOTE — Assessment & Plan Note (Signed)
 Chronic Somewhat controlled Still has spasms and incontinence Has IBS with frequent urgency and fecal incontinence Continue Myrbetriq  50 mg daily, vaginal estrogen Has seen urogynecology -  does not want any invasive treatment

## 2023-11-20 NOTE — Assessment & Plan Note (Signed)
Chronic Euvolemic Following with cardiology Continue current medications-taking furosemide 20 mg daily

## 2023-11-20 NOTE — Assessment & Plan Note (Addendum)
 Chronic Not ideally controlled Has fecal urgency and fecal incontinence H/o partial colectomy Takes Imodium as needed

## 2023-11-20 NOTE — Assessment & Plan Note (Signed)
 Chronic Blood pressure controlled Continue Cardizem  180 mg daily, losartan  100 mg daily

## 2023-11-20 NOTE — Assessment & Plan Note (Signed)
 Chronic Follows with cardiology No symptoms consistent with angina Continue current medications including Plavix  75 mg daily, ASA 81 mg daily,Crestor  20 mg daily and blood pressure medications

## 2023-11-20 NOTE — Assessment & Plan Note (Signed)
 Chronic Intermittent Taking methocarbamol  500 mg nightly prn

## 2023-11-20 NOTE — Assessment & Plan Note (Addendum)
 Chronic Has seen Dr. Bonner in the past Advised avoiding NSAIDs due to CKD Taking tylenol  Using heating pad

## 2023-11-20 NOTE — Assessment & Plan Note (Signed)
Chronic Intermittent Continue xanax 0.25-0.5 mg daily prn

## 2023-11-20 NOTE — Assessment & Plan Note (Signed)
 Subacute Related to GI bleed Anemia has improved, but last blood work showed persistent anemia Recheck CBC today

## 2023-11-20 NOTE — Assessment & Plan Note (Addendum)
 Chronic Lab Results  Component Value Date   HGBA1C 6.3 05/23/2023   Check A1c Low sugar / carb diet Stressed regular exercise

## 2023-11-20 NOTE — Assessment & Plan Note (Signed)
 Chronic Advised avoiding NSAIDs

## 2023-11-20 NOTE — Assessment & Plan Note (Signed)
Chronic Regular exercise and healthy diet encouraged Continue Crestor 20 mg daily

## 2023-11-20 NOTE — Assessment & Plan Note (Addendum)
 Chronic Not ideally controlled Increase trazodone  to 150 mg nightly Takes 1/2 alprazolam  HS prn - hopefully will be able to take this less with increased trazodone 

## 2023-11-21 ENCOUNTER — Other Ambulatory Visit (HOSPITAL_BASED_OUTPATIENT_CLINIC_OR_DEPARTMENT_OTHER): Payer: Self-pay

## 2023-11-21 DIAGNOSIS — D225 Melanocytic nevi of trunk: Secondary | ICD-10-CM | POA: Diagnosis not present

## 2023-11-21 DIAGNOSIS — Z1283 Encounter for screening for malignant neoplasm of skin: Secondary | ICD-10-CM | POA: Diagnosis not present

## 2023-11-21 DIAGNOSIS — Z86006 Personal history of melanoma in-situ: Secondary | ICD-10-CM | POA: Diagnosis not present

## 2023-11-21 DIAGNOSIS — L821 Other seborrheic keratosis: Secondary | ICD-10-CM | POA: Diagnosis not present

## 2023-11-21 DIAGNOSIS — Z08 Encounter for follow-up examination after completed treatment for malignant neoplasm: Secondary | ICD-10-CM | POA: Diagnosis not present

## 2023-11-21 DIAGNOSIS — L570 Actinic keratosis: Secondary | ICD-10-CM | POA: Diagnosis not present

## 2023-11-21 DIAGNOSIS — X32XXXD Exposure to sunlight, subsequent encounter: Secondary | ICD-10-CM | POA: Diagnosis not present

## 2023-11-22 ENCOUNTER — Other Ambulatory Visit (HOSPITAL_BASED_OUTPATIENT_CLINIC_OR_DEPARTMENT_OTHER): Payer: Self-pay

## 2023-11-22 MED ORDER — FUROSEMIDE 20 MG PO TABS
20.0000 mg | ORAL_TABLET | Freq: Every day | ORAL | 2 refills | Status: AC
  Filled 2023-11-22: qty 135, 68d supply, fill #0
  Filled 2024-01-13: qty 135, 68d supply, fill #1
  Filled 2024-04-01: qty 135, 68d supply, fill #2

## 2023-11-23 ENCOUNTER — Other Ambulatory Visit (HOSPITAL_BASED_OUTPATIENT_CLINIC_OR_DEPARTMENT_OTHER): Payer: Self-pay

## 2023-11-24 ENCOUNTER — Other Ambulatory Visit (HOSPITAL_BASED_OUTPATIENT_CLINIC_OR_DEPARTMENT_OTHER): Payer: Self-pay

## 2023-11-26 ENCOUNTER — Other Ambulatory Visit (HOSPITAL_BASED_OUTPATIENT_CLINIC_OR_DEPARTMENT_OTHER): Payer: Self-pay

## 2023-11-27 ENCOUNTER — Other Ambulatory Visit (HOSPITAL_BASED_OUTPATIENT_CLINIC_OR_DEPARTMENT_OTHER): Payer: Self-pay

## 2023-11-28 ENCOUNTER — Other Ambulatory Visit (HOSPITAL_BASED_OUTPATIENT_CLINIC_OR_DEPARTMENT_OTHER): Payer: Self-pay

## 2023-11-29 ENCOUNTER — Other Ambulatory Visit (HOSPITAL_BASED_OUTPATIENT_CLINIC_OR_DEPARTMENT_OTHER): Payer: Self-pay

## 2023-11-30 ENCOUNTER — Other Ambulatory Visit (HOSPITAL_BASED_OUTPATIENT_CLINIC_OR_DEPARTMENT_OTHER): Payer: Self-pay

## 2023-12-01 ENCOUNTER — Other Ambulatory Visit (HOSPITAL_BASED_OUTPATIENT_CLINIC_OR_DEPARTMENT_OTHER): Payer: Self-pay

## 2023-12-03 ENCOUNTER — Other Ambulatory Visit (HOSPITAL_BASED_OUTPATIENT_CLINIC_OR_DEPARTMENT_OTHER): Payer: Self-pay

## 2023-12-04 ENCOUNTER — Other Ambulatory Visit (HOSPITAL_BASED_OUTPATIENT_CLINIC_OR_DEPARTMENT_OTHER): Payer: Self-pay

## 2023-12-05 ENCOUNTER — Other Ambulatory Visit (HOSPITAL_BASED_OUTPATIENT_CLINIC_OR_DEPARTMENT_OTHER): Payer: Self-pay

## 2023-12-05 ENCOUNTER — Encounter: Payer: Self-pay | Admitting: Vascular Surgery

## 2023-12-05 ENCOUNTER — Ambulatory Visit: Attending: Vascular Surgery | Admitting: Vascular Surgery

## 2023-12-05 VITALS — BP 102/55 | HR 66 | Temp 97.9°F | Resp 18 | Ht 63.0 in | Wt 148.0 lb

## 2023-12-05 DIAGNOSIS — I771 Stricture of artery: Secondary | ICD-10-CM

## 2023-12-05 NOTE — Progress Notes (Signed)
 Patient ID: Susan Gillespie, female   DOB: 1945/06/20, 78 y.o.   MRN: [id number]  Reason for Consult: New Patient (Initial Visit)   Referred by Santo Kelly A*  Subjective:     HPI:  Susan Gillespie is a 78 y.o. female has a history of left subclavian artery stenting followed by repeat stenting in 2017 2018 performed by Dr. Dolphus.  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  Prior to stenting she states that she had fatigue of her arm with lifting and overhead and also had some episodes of dizziness.  She has not had those lately and states that her pulses are always remain palpable.  She continues on aspirin  and a statin.  She has recently fully retired from nursing does not have any complaints related to today's visit.  Past Medical History:  Diagnosis Date   Anxiety    Aortic atherosclerosis (HCC)    Artery stenosis (HCC)    left internal carotid, left subclavian   Arthritis    Atherosclerosis of subclavian artery    Bleeding tendency (HCC) 05/16/2012   Hx post op bleeding (epidural hematoma s/p ACDF '10; LLQ hematoma due to superficial vessel fascial bleed post colon resection; Normal platelet count; saw Dr. Freddie '14   CAD in native artery    a. mod to mod-severe CAD by cath 10/2016, med rx.   Carotid stenosis, bilateral 08/31/2016   1-39% right and 50-69% lefft ICA stenosis dopplers 04/2019   Chronic diastolic CHF (congestive heart failure) (HCC)    Colon polyp    Diverticulitis    Family history of cardiovascular disease    Fracture of right fibula    GERD (gastroesophageal reflux disease)    Headache(784.0)    mirgraines - history of   Hearing aid worn    B/L   HOH (hard of hearing)    wears hearing aids   Hyperlipidemia    Hypertension    IBS (irritable bowel syndrome)    followed by Dr. Luis   Liver cyst    Melanoma (HCC)    removed from back 04/2013   PAF (paroxysmal atrial fibrillation)  (HCC)    Shortness of breath dyspnea    with exertion   Stroke (HCC) 06/2012   mild   SVT (supraventricular tachycardia) (HCC)    Urinary urgency    UTI (lower urinary tract infection)    x 2 since June 2016   Wears glasses    Family History  Problem Relation Age of Onset   Heart disease Father    Heart disease Sister    Heart attack Sister    Cancer - Lung Sister    Heart disease Brother    Heart attack Brother    Stroke Son    Hyperlipidemia Other    Hypertension Other    Cancer Other        lung, esophagus, stomach   Stroke Other    Breast cancer Neg Hx    BRCA 1/2 Neg Hx    Past Surgical History:  Procedure Laterality Date   ABDOMINAL HYSTERECTOMY     ANGIOPLASTY     stent in subclavian LEFT 2016, angioplasty of stent 2017   APPENDECTOMY     AUGMENTATION MAMMAPLASTY Bilateral    replaced silicone with saline 2004   BONE BIOPSY  09/16/2023   Procedure: BIOPSY, GI;  Surgeon: Legrand Victory LITTIE DOUGLAS, MD;  Location: [facility];  Service: Gastroenterology;;  BREAST EXCISIONAL BIOPSY Left    benign 1977   BREAST SURGERY     BIL breast augmentation   CHOLECYSTECTOMY     COLON SURGERY     Sigmoid Colectomy, returned for post op bleeding   COLONOSCOPY N/A 09/17/2023   Procedure: COLONOSCOPY;  Surgeon: Suzann Inocente HERO, MD;  Location: [facility] ENDOSCOPY;  Service: Gastroenterology;  Laterality: N/A;   COLONOSCOPY W/ POLYPECTOMY     COLONOSCOPY WITH PROPOFOL  N/A 05/05/2016   Procedure: COLONOSCOPY WITH PROPOFOL ;  Surgeon: Alm Angle, MD;  Location: [facility] ENDOSCOPY;  Service: General;  Laterality: N/A;   ESOPHAGOGASTRODUODENOSCOPY N/A 09/16/2023   Procedure: EGD (ESOPHAGOGASTRODUODENOSCOPY);  Surgeon: Legrand Victory LITTIE DOUGLAS, MD;  Location: [facility] ENDOSCOPY;  Service: Gastroenterology;  Laterality: N/A;   ESOPHAGOGASTRODUODENOSCOPY (EGD) WITH PROPOFOL  N/A 05/05/2016   Procedure: ESOPHAGOGASTRODUODENOSCOPY (EGD) WITH PROPOFOL ;  Surgeon: Alm Angle, MD;  Location: [facility];  Service:  General;  Laterality: N/A;   EYE SURGERY     eyelid drooping fixed   GIVENS CAPSULE STUDY N/A 09/19/2023   Procedure: IMAGING PROCEDURE, GI TRACT, INTRALUMINAL, VIA CAPSULE;  Surgeon: Suzann Inocente HERO, MD;  Location: [facility] ENDOS[facility];  Service: Gastroenterology;  Laterality: N/A;   IR ANGIO INTRA EXTRACRAN SEL COM CAROTID INNOMINATE BILAT MOD SED  10/16/2018   IR ANGIO VERTEBRAL SEL SUBCLAVIAN INNOMINATE UNI L MOD SED  10/16/2018   IR ANGIO VERTEBRAL SEL VERTEBRAL UNI R MOD SED  10/16/2018   IR GENERIC HISTORICAL  12/24/2015   IR ANGIO INTRA EXTRACRAN SEL COM CAROTID INNOMINATE BILAT MOD SED 12/24/2015 Thyra Nash, MD [facility]   IR GENERIC HISTORICAL  12/24/2015   IR ANGIOGRAM EXTREMITY LEFT 12/24/2015 Thyra Nash, MD [facility]   IR GENERIC HISTORICAL  12/24/2015   IR ANGIO VERTEBRAL SEL VERTEBRAL UNI R MOD SED 12/24/2015 Thyra Nash, MD [facility]   IR GENERIC HISTORICAL  01/05/2016   IR PTA NON CORO-LOWER EXTREM 01/05/2016 Thyra Nash, MD [facility]   IR GENERIC HISTORICAL  03/07/2016   IR RADIOLOGIST EVAL & MGMT 03/07/2016 [facility]   IR RADIOLOGIST EVAL & MGMT  11/30/2022   JOINT REPLACEMENT Left    thumb   LEFT HEART CATH AND CORONARY ANGIOGRAPHY N/A 11/10/2016   Procedure: Left Heart Cath and Coronary Angiography;  Surgeon: Claudene Victory ORN, MD;  Location: [facility] INVASIVE CV LAB;  Service: Cardiovascular;  Laterality: N/A;   MASTOID DEBRIDEMENT     MASTOIDECTOMY REVISION     RADIOLOGY WITH ANESTHESIA N/A 11/09/2014   Procedure: STENT PLACEMENT;  Surgeon: Thyra Nash, MD;  Location: [facility] OR;  Service: Radiology;  Laterality: N/A;   RADIOLOGY WITH ANESTHESIA N/A 05/31/2015   Procedure: RADIOLOGY WITH ANESTHESIA;  Surgeon: Thyra Nash, MD;  Location: [facility] OR;  Service: Radiology;  Laterality: N/A;   RADIOLOGY WITH ANESTHESIA N/A 01/05/2016   Procedure: RADIOLOGY WITH ANESTHESIA ANGIOPLASTY WITH STENTNG;  Surgeon: Thyra Nash, MD;  Location: [facility] OR;   Service: Radiology;  Laterality: N/A;   RADIOLOGY WITH ANESTHESIA N/A 10/16/2018   Procedure: STENTING;  Surgeon: Nash Thyra, MD;  Location: [facility] OR;  Service: Radiology;  Laterality: N/A;   skin cancer excised  04/2013   Melonoma   SPINE SURGERY     cervical fusion, returned to OR for post op  bleeding   TONSILLECTOMY     TONSILLECTOMY     TUBAL LIGATION     VAGINA SURGERY     anterior posterior repair    Short Social History:  Social History   Tobacco Use  Smoking status: Former    Current packs/day: 0.00    Average packs/day: 2.0 packs/day for 22.0 years (44.0 ttl pk-yrs)    Types: Cigarettes    Start date: 04/24/1963    Quit date: 04/23/1985    Years since quitting: 38.6   Smokeless tobacco: Never  Substance Use Topics   Alcohol use: Yes    Alcohol/week: 4.0 standard drinks of alcohol    Types: 4 Glasses of wine per week    Comment: 1 drink a  day    Allergies  Allergen Reactions   Phenergan  [Promethazine  Hcl] Other (See Comments)    IV suppressed breathing    Daypro [Oxaprozin] Swelling    facial   Lisinopril      cough   Oxycodone  Itching   Simvastatin     REACTION: myalgias and fatigue   Tramadol      Current Outpatient Medications  Medication Sig Dispense Refill   ALPRAZolam  (XANAX ) 0.5 MG tablet Take ONE-HALF to 1 tablets (0.25-0.5 mg total) by mouth at bedtime as needed 30 tablet 5   ascorbic acid (VITAMIN C) 500 MG tablet Take 500 mg by mouth daily.     aspirin  EC 81 MG tablet Take 1 tablet (81 mg total) by mouth daily. Swallow whole. 30 tablet 12   cetirizine (ZYRTEC) 10 MG tablet Take 10 mg by mouth every morning.     Cholecalciferol  (VITAMIN D3) 50 MCG (2000 UT) capsule      clopidogrel  (PLAVIX ) 75 MG tablet Take 1 tablet (75 mg total) by mouth daily. 90 tablet 2   diltiazem  (CARDIZEM  CD) 180 MG 24 hr capsule Take 1 capsule (180 mg total) by mouth at bedtime. Patient needs appointment with Cardiology [phone] for further refills. 90 capsule 3    estradiol  (ESTRACE ) 0.1 MG/GM vaginal cream Place 1 Applicatorful vaginally 2 (two) times a week. 127.5 g 4   famotidine  (PEPCID ) 40 MG tablet Take 1 tablet (40 mg total) by mouth daily. 90 tablet 2   ferrous sulfate  325 (65 FE) MG tablet Take 325 mg by mouth daily with breakfast.     fluticasone  (FLONASE ) 50 MCG/ACT nasal spray Place 1 spray into both nostrils daily as needed for allergies.     furosemide  (LASIX ) 20 MG tablet TAKE 1 TABLET (20 MG TOTAL) BY MOUTH DAILY. MAY TAKE AN ADDITIONAL TABLET FOR EDEMA 135 tablet 2   losartan  (COZAAR ) 100 MG tablet Take 1 tablet (100 mg total) by mouth daily. 90 tablet 3   methocarbamol  (ROBAXIN ) 500 MG tablet Take 1 tablet (500 mg total) by mouth daily as needed for muscle spasms. 60 tablet 5   metoprolol  succinate (TOPROL -XL) 50 MG 24 hr tablet Take 50 mg by mouth as needed (svt). Take with or immediately following a meal. -     mirabegron  ER (MYRBETRIQ ) 50 MG TB24 tablet Take 1 tablet (50 mg total) by mouth daily. 90 tablet 2   Multiple Vitamin (MULTI-VITAMIN DAILY PO)      Multiple Vitamins-Minerals (PRESERVISION AREDS 2+MULTI VIT) CAPS      nitroGLYCERIN  (NITROSTAT ) 0.4 MG SL tablet Place 0.4 mg under the tongue every 5 (five) minutes as needed for chest pain.     Omega-3 Fatty Acids (FISH OIL PO) Take 1 tablet by mouth daily.     potassium chloride  (KLOR-CON ) 10 MEQ tablet Take 1 tablet (10 mEq total) by mouth daily. 90 tablet 1   rosuvastatin  (CRESTOR ) 20 MG tablet Take 1 tablet (20 mg total) by mouth daily. 90 tablet 1  traZODone  (DESYREL ) 100 MG tablet Take 1.5 tablets (150 mg total) by mouth at bedtime. 135 tablet 1   vitamin B-12 (CYANOCOBALAMIN ) 500 MCG tablet Take 500 mcg by mouth daily.     No current facility-administered medications for this visit.    Review of Systems  Constitutional:  Constitutional negative. HENT: HENT negative.  Eyes: Eyes negative.  Respiratory: Respiratory negative.  Cardiovascular: Cardiovascular negative.   GI: Gastrointestinal negative.  Musculoskeletal: Musculoskeletal negative.  Skin: Skin negative.  Neurological: Neurological negative. Hematologic: Hematologic/lymphatic negative.  Psychiatric: Psychiatric negative.        Objective:  Objective   Vitals:   12/05/23 1356 12/05/23 1400  BP: 136/61 (!) 102/55  Pulse: 66   Resp: 18   Temp: 97.9 F (36.6 C)   SpO2: 98%      Physical Exam HENT:     Head: Normocephalic.     Nose: Nose normal.  Eyes:     Pupils: Pupils are equal, round, and reactive to light.  Neck:     Comments: There is a supraclavicular and carotid bruit on the left Cardiovascular:     Rate and Rhythm: Normal rate.     Pulses:          Radial pulses are 2+ on the right side and 1+ on the left side.  Pulmonary:     Effort: Pulmonary effort is normal.  Abdominal:     General: Abdomen is flat.  Skin:    General: Skin is warm.     Capillary Refill: Capillary refill takes less than 2 seconds.  Neurological:     General: No focal deficit present.     Mental Status: She is alert.  Psychiatric:        Mood and Affect: Mood normal.        Thought Content: Thought content normal.        Judgment: Judgment normal.     Data: Right Carotid Findings:  +----------+--------+--------+--------+------------------+--------+           PSV cm/sEDV cm/sStenosisPlaque DescriptionComments  +----------+--------+--------+--------+------------------+--------+  CCA Prox  114     20                                          +----------+--------+--------+--------+------------------+--------+  CCA Mid   81      14                                          +----------+--------+--------+--------+------------------+--------+  CCA Distal73      17                                          +----------+--------+--------+--------+------------------+--------+  ICA Prox  155     37      1-39%   heterogenous                 +----------+--------+--------+--------+------------------+--------+  ICA Mid   130     27                                          +----------+--------+--------+--------+------------------+--------+  ICA Ipdujo888  22                                          +----------+--------+--------+--------+------------------+--------+  ECA      165     15                                          +----------+--------+--------+--------+------------------+--------+   +----------+--------+-------+----------------+-------------------+           PSV cm/sEDV cmsDescribe        Arm Pressure (mmHG)  +----------+--------+-------+----------------+-------------------+  [id number]    23     Multiphasic, [id number]                  +----------+--------+-------+----------------+-------------------+   +---------+--------+---+--------+--+---------+  VertebralPSV cm/s111EDV cm/s22Antegrade  +---------+--------+---+--------+--+---------+      Left Carotid Findings:  +----------+--------+--------+--------+--------------------+--------+           PSV cm/sEDV cm/sStenosisPlaque Description  Comments  +----------+--------+--------+--------+--------------------+--------+  CCA Prox  81      15                                            +----------+--------+--------+--------+--------------------+--------+  CCA Mid   66      14                                            +----------+--------+--------+--------+--------------------+--------+  CCA Distal72      15                                            +----------+--------+--------+--------+--------------------+--------+  ICA Prox  242     58      40-59%  calcific                      +----------+--------+--------+--------+--------------------+--------+  ICA Mid   219     35                                            +----------+--------+--------+--------+--------------------+--------+   ICA Distal90      20                                            +----------+--------+--------+--------+--------------------+--------+  ECA      292     16      >50%    calcific and diffuse          +----------+--------+--------+--------+--------------------+--------+   +----------+--------+--------+--------+-------------------+           PSV cm/sEDV cm/sDescribeArm Pressure (mmHG)  +----------+--------+--------+--------+-------------------+  Subclavian322    10      Stenotic120                  +----------+--------+--------+--------+-------------------+   +---------+--------+--+--------+-+----------+  VertebralPSV cm/s50EDV cm/s5Retrograde  +---------+--------+--+--------+-+----------+  Narrowing in left subclavian noted in image 45 and 47.      Summary:  Right Carotid: Velocities in the right ICA are consistent with a 1-39%  stenosis.   Left Carotid: Velocities in the left ICA are consistent with a 40-59%  stenosis.               The ECA appears >50% stenosed. Narrowing in left subclavian  noted               in image 45 and 47.   Vertebrals:  Right vertebral artery demonstrates antegrade flow. Left  vertebral              artery demonstrates retrograde flow.  Subclavians: Bilateral subclavian arteries were stenotic.      Assessment/Plan:     78 year old female recently underwent carotid duplex which demonstrates bilateral subclavian artery stenosis where there is a known stent on the left.  She remains asymptomatic from this and although the pressure is lower on the left than right she does have a palpable pulse.  Symptoms from prestenting have resolved.  Will follow-up in 1 year with carotid duplex and at that time we will transition to PA office if she remains asymptomatic with patent left subclavian stents.     Penne Lonni Colorado MD [facility]

## 2023-12-06 ENCOUNTER — Other Ambulatory Visit (HOSPITAL_BASED_OUTPATIENT_CLINIC_OR_DEPARTMENT_OTHER): Payer: Self-pay

## 2023-12-06 ENCOUNTER — Telehealth: Payer: Self-pay

## 2023-12-06 NOTE — Telephone Encounter (Signed)
 Patient called to ask for Dr. Sheree to manage her plavix .  Patient knows Dr. Sheree will now manage her plavix .

## 2023-12-07 ENCOUNTER — Other Ambulatory Visit: Payer: Self-pay

## 2023-12-07 ENCOUNTER — Other Ambulatory Visit (HOSPITAL_BASED_OUTPATIENT_CLINIC_OR_DEPARTMENT_OTHER): Payer: Self-pay

## 2023-12-11 ENCOUNTER — Other Ambulatory Visit (HOSPITAL_BASED_OUTPATIENT_CLINIC_OR_DEPARTMENT_OTHER): Payer: Self-pay

## 2023-12-11 ENCOUNTER — Other Ambulatory Visit: Payer: Self-pay | Admitting: Vascular Surgery

## 2023-12-11 MED ORDER — CLOPIDOGREL BISULFATE 75 MG PO TABS
75.0000 mg | ORAL_TABLET | Freq: Every day | ORAL | 11 refills | Status: AC
  Filled 2023-12-11: qty 30, 30d supply, fill #0
  Filled 2024-01-13: qty 30, 30d supply, fill #1
  Filled 2024-03-19: qty 30, 30d supply, fill #2
  Filled 2024-04-01 – 2024-05-18 (×2): qty 30, 30d supply, fill #3

## 2023-12-19 ENCOUNTER — Telehealth: Payer: Self-pay | Admitting: Gastroenterology

## 2023-12-19 NOTE — Telephone Encounter (Addendum)
 Procedure:Colonoscopy Procedure date: 12/27/23 Procedure location: [facility] Arrival Time: 7:30 am Spoke with the patient Y/N: Yes Any prep concerns? No  Has the patient obtained the prep from the pharmacy ? Yes Do you have a care partner and transportation: Yes Any additional concerns? No

## 2023-12-20 ENCOUNTER — Encounter (HOSPITAL_COMMUNITY): Payer: Self-pay | Admitting: Gastroenterology

## 2023-12-20 NOTE — Progress Notes (Signed)
 Pre op call Susan Gillespie    PCP-Burns MD Cardiologist-Turner MD Pulmonologist- n/a  EKG-09/18/23 Echo-10/16/23 Cath-2018 Stress-10/09/23 ICD/PM- n/a GLP1-n/a Blood Thinner-Plavix  hold 5 days last dose 9/5  History: CAD,HTN,CHF,Stroke, Aortic Atherosclerosis, Carotid Stenosis, Afib, SVT, pre DM. Patient last saw her pcp 11/20/23. She sees cardiology, last saw them 08/2023 with a recommended f/u in 6 months. The GI office sent a pharm clearance to her prescribing MD, Dr Monna. Per pt no recent cardiac or breathing isses, no mobility issues. Anesthesia Review- Yes- okay to proceed

## 2023-12-25 ENCOUNTER — Other Ambulatory Visit: Payer: Self-pay

## 2023-12-25 ENCOUNTER — Other Ambulatory Visit (HOSPITAL_BASED_OUTPATIENT_CLINIC_OR_DEPARTMENT_OTHER): Payer: Self-pay

## 2023-12-26 ENCOUNTER — Other Ambulatory Visit (HOSPITAL_BASED_OUTPATIENT_CLINIC_OR_DEPARTMENT_OTHER): Payer: Self-pay

## 2023-12-26 DIAGNOSIS — H353132 Nonexudative age-related macular degeneration, bilateral, intermediate dry stage: Secondary | ICD-10-CM | POA: Diagnosis not present

## 2023-12-27 ENCOUNTER — Encounter (HOSPITAL_COMMUNITY): Payer: Self-pay | Admitting: Anesthesiology

## 2023-12-27 ENCOUNTER — Encounter (HOSPITAL_COMMUNITY)
Admission: RE | Disposition: A | Discharge status: Home / Self Care | Payer: Self-pay | Source: Home / Self Care | Attending: Gastroenterology

## 2023-12-27 ENCOUNTER — Encounter (HOSPITAL_COMMUNITY): Payer: Self-pay | Admitting: Gastroenterology

## 2023-12-27 ENCOUNTER — Ambulatory Visit (HOSPITAL_COMMUNITY)
Admission: RE | Admit: 2023-12-27 | Discharge: 2023-12-27 | Disposition: A | Discharge status: Home / Self Care | Attending: Gastroenterology | Admitting: Gastroenterology

## 2023-12-27 ENCOUNTER — Other Ambulatory Visit: Payer: Self-pay

## 2023-12-27 ENCOUNTER — Ambulatory Visit (HOSPITAL_BASED_OUTPATIENT_CLINIC_OR_DEPARTMENT_OTHER): Payer: Self-pay | Admitting: Anesthesiology

## 2023-12-27 DIAGNOSIS — I2510 Atherosclerotic heart disease of native coronary artery without angina pectoris: Secondary | ICD-10-CM

## 2023-12-27 DIAGNOSIS — I272 Pulmonary hypertension, unspecified: Secondary | ICD-10-CM | POA: Insufficient documentation

## 2023-12-27 DIAGNOSIS — I739 Peripheral vascular disease, unspecified: Secondary | ICD-10-CM | POA: Diagnosis not present

## 2023-12-27 DIAGNOSIS — Z7901 Long term (current) use of anticoagulants: Secondary | ICD-10-CM | POA: Insufficient documentation

## 2023-12-27 DIAGNOSIS — D123 Benign neoplasm of transverse colon: Secondary | ICD-10-CM | POA: Insufficient documentation

## 2023-12-27 DIAGNOSIS — I5032 Chronic diastolic (congestive) heart failure: Secondary | ICD-10-CM | POA: Diagnosis not present

## 2023-12-27 DIAGNOSIS — D649 Anemia, unspecified: Secondary | ICD-10-CM | POA: Insufficient documentation

## 2023-12-27 DIAGNOSIS — Z1211 Encounter for screening for malignant neoplasm of colon: Secondary | ICD-10-CM | POA: Insufficient documentation

## 2023-12-27 DIAGNOSIS — K635 Polyp of colon: Secondary | ICD-10-CM | POA: Insufficient documentation

## 2023-12-27 DIAGNOSIS — Z860100 Personal history of colon polyps, unspecified: Secondary | ICD-10-CM | POA: Diagnosis not present

## 2023-12-27 DIAGNOSIS — Z87891 Personal history of nicotine dependence: Secondary | ICD-10-CM | POA: Diagnosis not present

## 2023-12-27 DIAGNOSIS — K621 Rectal polyp: Secondary | ICD-10-CM

## 2023-12-27 DIAGNOSIS — K648 Other hemorrhoids: Secondary | ICD-10-CM | POA: Insufficient documentation

## 2023-12-27 DIAGNOSIS — I480 Paroxysmal atrial fibrillation: Secondary | ICD-10-CM

## 2023-12-27 DIAGNOSIS — K5790 Diverticulosis of intestine, part unspecified, without perforation or abscess without bleeding: Secondary | ICD-10-CM

## 2023-12-27 DIAGNOSIS — K5140 Inflammatory polyps of colon without complications: Secondary | ICD-10-CM | POA: Diagnosis not present

## 2023-12-27 DIAGNOSIS — M1990 Unspecified osteoarthritis, unspecified site: Secondary | ICD-10-CM | POA: Diagnosis not present

## 2023-12-27 DIAGNOSIS — Z8719 Personal history of other diseases of the digestive system: Secondary | ICD-10-CM

## 2023-12-27 DIAGNOSIS — K5730 Diverticulosis of large intestine without perforation or abscess without bleeding: Secondary | ICD-10-CM | POA: Diagnosis not present

## 2023-12-27 DIAGNOSIS — D122 Benign neoplasm of ascending colon: Secondary | ICD-10-CM | POA: Insufficient documentation

## 2023-12-27 DIAGNOSIS — K219 Gastro-esophageal reflux disease without esophagitis: Secondary | ICD-10-CM | POA: Diagnosis not present

## 2023-12-27 DIAGNOSIS — I110 Hypertensive heart disease with heart failure: Secondary | ICD-10-CM | POA: Insufficient documentation

## 2023-12-27 DIAGNOSIS — Z8673 Personal history of transient ischemic attack (TIA), and cerebral infarction without residual deficits: Secondary | ICD-10-CM | POA: Diagnosis not present

## 2023-12-27 SURGERY — COLONOSCOPY
Anesthesia: Monitor Anesthesia Care

## 2023-12-27 MED ORDER — PHENYLEPHRINE 80 MCG/ML (10ML) SYRINGE FOR IV PUSH (FOR BLOOD PRESSURE SUPPORT)
PREFILLED_SYRINGE | INTRAVENOUS | Status: DC | PRN
  Administered 2023-12-27 (×2): 160 ug via INTRAVENOUS

## 2023-12-27 MED ORDER — SODIUM CHLORIDE 0.9 % IV SOLN: INTRAVENOUS | Status: DC

## 2023-12-27 MED ORDER — SODIUM CHLORIDE 0.9 % IV SOLN
INTRAVENOUS | Status: AC | PRN
  Administered 2023-12-27: 500 mL via INTRAMUSCULAR

## 2023-12-27 MED ORDER — PROPOFOL 10 MG/ML IV BOLUS
INTRAVENOUS | Status: DC | PRN
  Administered 2023-12-27 (×2): 20 mg via INTRAVENOUS

## 2023-12-27 MED ORDER — PROPOFOL 1000 MG/100ML IV EMUL
INTRAVENOUS | Status: AC
  Filled 2023-12-27: qty 100

## 2023-12-27 MED ORDER — PROPOFOL 500 MG/50ML IV EMUL
INTRAVENOUS | Status: DC | PRN
  Administered 2023-12-27: 150 ug/kg/min via INTRAVENOUS

## 2023-12-27 MED ORDER — LIDOCAINE 2% (20 MG/ML) 5 ML SYRINGE
INTRAMUSCULAR | Status: DC | PRN
  Administered 2023-12-27: 80 mg via INTRAVENOUS

## 2023-12-27 NOTE — H&P (Signed)
 History and Physical:  This patient presents for endoscopic testing for:   78 year old woman here today for colonoscopy and polypectomy. Clinical details are outlined in the 10/25/2023 office note by our APP.  Patient has no significant clinical changes since then.  Her hemoglobin rose afterward to 11.7 on 11/20/2023.  She has been off Plavix  for the last 5 days. Patient is otherwise without complaints or active issues today.   Past Medical History: Past Medical History:  Diagnosis Date   Anxiety    Aortic atherosclerosis (HCC)    Artery stenosis (HCC)    left internal carotid, left subclavian   Arthritis    Atherosclerosis of subclavian artery    Bleeding tendency (HCC) 05/16/2012   Hx post op bleeding (epidural hematoma s/p ACDF '10; LLQ hematoma due to superficial vessel fascial bleed post colon resection; Normal platelet count; saw Dr. Freddie '14   CAD in native artery    a. mod to mod-severe CAD by cath 10/2016, med rx.   Carotid stenosis, bilateral 08/31/2016   1-39% right and 50-69% lefft ICA stenosis dopplers 04/2019   Chronic diastolic CHF (congestive heart failure) (HCC)    Colon polyp    Diverticulitis    Family history of cardiovascular disease    Fracture of right fibula    GERD (gastroesophageal reflux disease)    Headache(784.0)    mirgraines - history of   Hearing aid worn    B/L   HOH (hard of hearing)    wears hearing aids   Hyperlipidemia    Hypertension    IBS (irritable bowel syndrome)    followed by Dr. Luis   Liver cyst    Melanoma (HCC)    removed from back 04/2013   PAF (paroxysmal atrial fibrillation) (HCC)    Shortness of breath dyspnea    with exertion   Stroke (HCC) 06/2012   mild   SVT (supraventricular tachycardia) (HCC)    Urinary urgency    UTI (lower urinary tract infection)    x 2 since June 2016   Wears glasses      Past Surgical History: Past Surgical History:  Procedure Laterality Date   ABDOMINAL HYSTERECTOMY      ANGIOPLASTY     stent in subclavian LEFT 2016, angioplasty of stent 2017   APPENDECTOMY     AUGMENTATION MAMMAPLASTY Bilateral    replaced silicone with saline 2004   BONE BIOPSY  09/16/2023   Procedure: BIOPSY, GI;  Surgeon: Legrand Victory LITTIE DOUGLAS, MD;  Location: [facility] ENDOSCOPY;  Service: Gastroenterology;;   BREAST EXCISIONAL BIOPSY Left    benign 1977   BREAST SURGERY     BIL breast augmentation   CHOLECYSTECTOMY     COLON SURGERY     Sigmoid Colectomy, returned for post op bleeding   COLONOSCOPY N/A 09/17/2023   Procedure: COLONOSCOPY;  Surgeon: Suzann Inocente HERO, MD;  Location: [facility] ENDOSCOPY;  Service: Gastroenterology;  Laterality: N/A;   COLONOSCOPY W/ POLYPECTOMY     COLONOSCOPY WITH PROPOFOL  N/A 05/05/2016   Procedure: COLONOSCOPY WITH PROPOFOL ;  Surgeon: Alm Angle, MD;  Location: [facility] ENDOSCOPY;  Service: General;  Laterality: N/A;   ESOPHAGOGASTRODUODENOSCOPY N/A 09/16/2023   Procedure: EGD (ESOPHAGOGASTRODUODENOSCOPY);  Surgeon: Legrand Victory LITTIE DOUGLAS, MD;  Location: [facility] ENDOSCOPY;  Service: Gastroenterology;  Laterality: N/A;   ESOPHAGOGASTRODUODENOSCOPY (EGD) WITH PROPOFOL  N/A 05/05/2016   Procedure: ESOPHAGOGASTRODUODENOSCOPY (EGD) WITH PROPOFOL ;  Surgeon: Alm Angle, MD;  Location: [facility] ENDOSCOPY;  Service: General;  Laterality: N/A;   EYE SURGERY  eyelid drooping fixed   GIVENS CAPSULE STUDY N/A 09/19/2023   Procedure: IMAGING PROCEDURE, GI TRACT, INTRALUMINAL, VIA CAPSULE;  Surgeon: Suzann Inocente HERO, MD;  Location: [facility] ENDOSCOPY;  Service: Gastroenterology;  Laterality: N/A;   IR ANGIO INTRA EXTRACRAN SEL COM CAROTID INNOMINATE BILAT MOD SED  10/16/2018   IR ANGIO VERTEBRAL SEL SUBCLAVIAN INNOMINATE UNI L MOD SED  10/16/2018   IR ANGIO VERTEBRAL SEL VERTEBRAL UNI R MOD SED  10/16/2018   IR GENERIC HISTORICAL  12/24/2015   IR ANGIO INTRA EXTRACRAN SEL COM CAROTID INNOMINATE BILAT MOD SED 12/24/2015 Thyra Nash, MD [facility]   IR GENERIC HISTORICAL  12/24/2015   IR ANGIOGRAM  EXTREMITY LEFT 12/24/2015 Thyra Nash, MD [facility]   IR GENERIC HISTORICAL  12/24/2015   IR ANGIO VERTEBRAL SEL VERTEBRAL UNI R MOD SED 12/24/2015 Thyra Nash, MD [facility]   IR GENERIC HISTORICAL  01/05/2016   IR PTA NON CORO-LOWER EXTREM 01/05/2016 Thyra Nash, MD [facility]   IR GENERIC HISTORICAL  03/07/2016   IR RADIOLOGIST EVAL & MGMT 03/07/2016 [facility]   IR RADIOLOGIST EVAL & MGMT  11/30/2022   JOINT REPLACEMENT Left    thumb   LEFT HEART CATH AND CORONARY ANGIOGRAPHY N/A 11/10/2016   Procedure: Left Heart Cath and Coronary Angiography;  Surgeon: Claudene Victory ORN, MD;  Location: [facility] INVASIVE CV LAB;  Service: Cardiovascular;  Laterality: N/A;   MASTOID DEBRIDEMENT     MASTOIDECTOMY REVISION     RADIOLOGY WITH ANESTHESIA N/A 11/09/2014   Procedure: STENT PLACEMENT;  Surgeon: Thyra Nash, MD;  Location: [facility] OR;  Service: Radiology;  Laterality: N/A;   RADIOLOGY WITH ANESTHESIA N/A 05/31/2015   Procedure: RADIOLOGY WITH ANESTHESIA;  Surgeon: Thyra Nash, MD;  Location: [facility] OR;  Service: Radiology;  Laterality: N/A;   RADIOLOGY WITH ANESTHESIA N/A 01/05/2016   Procedure: RADIOLOGY WITH ANESTHESIA ANGIOPLASTY WITH STENTNG;  Surgeon: Thyra Nash, MD;  Location: [facility] OR;  Service: Radiology;  Laterality: N/A;   RADIOLOGY WITH ANESTHESIA N/A 10/16/2018   Procedure: STENTING;  Surgeon: Nash Thyra, MD;  Location: [facility] OR;  Service: Radiology;  Laterality: N/A;   skin cancer excised  04/2013   Melonoma   SPINE SURGERY     cervical fusion, returned to OR for post op  bleeding   TONSILLECTOMY     TONSILLECTOMY     TUBAL LIGATION     VAGINA SURGERY     anterior posterior repair    Allergies: Allergies  Allergen Reactions   Phenergan  [Promethazine  Hcl] Other (See Comments)    IV suppressed breathing    Daypro [Oxaprozin] Swelling    facial   Lisinopril      cough   Oxycodone  Itching   Simvastatin     REACTION: myalgias and fatigue    Tramadol      Outpatient Meds: Current Facility-Administered Medications  Medication Dose Route Frequency Provider Last Rate Last Admin   0.9 %  sodium chloride  infusion   Intravenous Continuous Heinz, Sara E, PA-C       0.9 %  sodium chloride  infusion    Continuous PRN Legrand Victory CROME III, MD 10 mL/hr at 12/27/23 0826 Continued from Pre-op  at 12/27/23 0826      ___________________________________________________________________ Objective   Exam:  BP (!) 139/47   Pulse 66   Temp 97.7 F (36.5 C) (Temporal)   Resp 10   Ht 5' 3 (1.6 m)   Wt 67 kg   LMP  (LMP Unknown)   SpO2 99%  BMI 26.17 kg/m   CV: regular , S1/S2 Resp: clear to auscultation bilaterally, normal RR and effort noted GI: soft, no tenderness, with active bowel sounds.   Assessment: Colon polyps History of lower GI bleeding   Plan: Colonoscopy   The benefits and risks of the planned procedure(s) were described in detail with the patient or (when appropriate) their health care proxy.  Risks were outlined as including, but not limited to, bleeding, infection, perforation, adverse medication reaction leading to cardiac or pulmonary decompensation, pancreatitis (if ERCP).  The limitation of incomplete mucosal visualization was also discussed.  No guarantees or warranties were given.  The patient is appropriate for an endoscopic procedure in the ambulatory setting.   - Victory Brand, MD

## 2023-12-27 NOTE — Discharge Instructions (Addendum)
 Resume your plavix  (clopidogrel ) at usual dose 3 days from now (on 12/30/23)  ________________  YOU HAD AN ENDOSCOPIC PROCEDURE TODAY: Refer to the procedure report and other information in the discharge instructions given to you for any specific questions about what was found during the examination. If this information does not answer your questions, please call [place] office at [phone] to clarify.   YOU SHOULD EXPECT: Some feelings of bloating in the abdomen. Passage of more gas than usual. Walking can help get rid of the air that was put into your GI tract during the procedure and reduce the bloating. If you had a lower endoscopy (such as a colonoscopy or flexible sigmoidoscopy) you may notice spotting of blood in your stool or on the toilet paper. Some abdominal soreness may be present for a day or two, also.  DIET: Your first meal following the procedure should be a light meal and then it is ok to progress to your normal diet. A half-sandwich or bowl of soup is an example of a good first meal. Heavy or fried foods are harder to digest and may make you feel nauseous or bloated. Drink plenty of fluids but you should avoid alcoholic beverages for 24 hours. If you had a esophageal dilation, please see attached instructions for diet.    ACTIVITY: Your care partner should take you home directly after the procedure. You should plan to take it easy, moving slowly for the rest of the day. You can resume normal activity the day after the procedure however YOU SHOULD NOT DRIVE, use power tools, machinery or perform tasks that involve climbing or major physical exertion for 24 hours (because of the sedation medicines used during the test).   SYMPTOMS TO REPORT IMMEDIATELY: A gastroenterologist can be reached at any hour. Please call [phone]  for any of the following symptoms:  Following lower endoscopy (colonoscopy, flexible sigmoidoscopy) Excessive amounts of blood in the stool  Significant  tenderness, worsening of abdominal pains  Swelling of the abdomen that is new, acute  Fever of 100 or higher  Following upper endoscopy (EGD, EUS, ERCP, esophageal dilation) Vomiting of blood or coffee ground material  New, significant abdominal pain  New, significant chest pain or pain under the shoulder blades  Painful or persistently difficult swallowing  New shortness of breath  Black, tarry-looking or red, bloody stools  FOLLOW UP:  If any biopsies were taken you will be contacted by phone or by letter within the next 1-3 weeks. Call [phone]  if you have not heard about the biopsies in 3 weeks.  Please also call with any specific questions about appointments or follow up tests.

## 2023-12-27 NOTE — Op Note (Addendum)
 [facility] Patient Name: Susan Gillespie Procedure Date: 12/27/2023 MRN: [id number] Attending MD: Victory CROME. Legrand , MD, [id number] Date of Birth: [birth year] CSN: [id number] Age: 78 Admit Type: Outpatient Procedure:                Colonoscopy Indications:              Colon polyps of uncertain behavior                           Polyps discovered during inpatient colonoscopy June                            2025 (reportedly in transverse, descending and                            rectum). Polyps were not removed at that time with                            Plavix  on board Providers:                Victory L. Legrand, MD, Hoy Penner, RN, Curtistine Bishop, Technician Referring MD:              Medicines:                Monitored Anesthesia Care Complications:            No immediate complications. Estimated Blood Loss:     Estimated blood loss was minimal. Procedure:                Pre-Anesthesia Assessment:                           - Prior to the procedure, a History and Physical                            was performed, and patient medications and                            allergies were reviewed. The patient's tolerance of                            previous anesthesia was also reviewed. The risks                            and benefits of the procedure and the sedation                            options and risks were discussed with the patient.                            All questions were answered, and informed consent                            was obtained.  Prior Anticoagulants: The patient has                            taken Plavix  (clopidogrel ), last dose was 5 days                            prior to procedure. ASA Grade Assessment: III - A                            patient with severe systemic disease. After                            reviewing the risks and benefits, the patient was                            deemed in satisfactory  condition to undergo the                            procedure.                           After obtaining informed consent, the colonoscope                            was passed under direct vision. Throughout the                            procedure, the patient's blood pressure, pulse, and                            oxygen saturations were monitored continuously. The                            [id number]-[id number] ([id number]) colonoscope was introduced                            through the anus and advanced to the the cecum,                            identified by appendiceal orifice and ileocecal                            valve. The colonoscopy was performed without                            difficulty. The patient tolerated the procedure                            well. The quality of the bowel preparation was                            good. The ileocecal valve, appendiceal orifice, and  rectum were photographed. Scope In: 9:29:14 AM Scope Out: 10:00:21 AM Scope Withdrawal Time: 0 hours 26 minutes 48 seconds  Total Procedure Duration: 0 hours 31 minutes 7 seconds  Findings:      The perianal and digital rectal examinations were normal.      Repeat examination of right colon under NBI performed.      Two sessile and semi-sessile polyps were found in the ascending colon.       The polyps were 3 to 8 mm in size (the larger had adherent mucus). These       polyps were removed with a cold snare. Resection and retrieval were       complete. (Jar 1)      Two sessile polyps were found in the transverse colon. The polyps were       diminutive in size. These polyps were removed with a cold snare.       Resection and retrieval were complete. (Jar 2)      A 10 mm polyp was found in the mid transverse colon. The polyp was       sessile. The polyp was removed with a cold snare. Resection and       retrieval (during scope insertion) were complete. During scope       withdrawal,  this polypectomy site was noted to have adherent clot and       slow oozing. To stop active bleeding, three hemostatic clips were       successfully placed (MR conditional). There was no bleeding at the end       of the procedure.      A diminutive pale and flat polyp was found in the proximal rectum.       Hyperplastic by NBI and [facility] appearance, therefore not removed.      Diverticula were found in the entire colon.      Internal hemorrhoids were found. The hemorrhoids were small.      The exam was otherwise without abnormality on direct and retroflexion       views. Impression:               - Two 3 to 8 mm polyps in the ascending colon,                            removed with a cold snare. Resected and retrieved.                           - Two diminutive polyps in the transverse colon,                            removed with a cold snare. Resected and retrieved.                           - One 10 mm polyp in the mid transverse colon,                            removed with a cold snare. Resected and retrieved.                            Clips (MR conditional) were placed.                           -  One diminutive polyp in the proximal rectum.                           - Diverticulosis in the entire examined colon.                           - Internal hemorrhoids.                           - The examination was otherwise normal on direct                            and retroflexion views.                           The polyp described as having been in the                            descending colon was in the distal transverse. The                            rectal polyp was hyperplastic and not removed. Moderate Sedation:      MAC sedation used Recommendation:           - Patient has a contact number available for                            emergencies. The signs and symptoms of potential                            delayed complications were discussed with the                             patient. Return to normal activities tomorrow.                            Written discharge instructions were provided to the                            patient.                           - Resume previous diet.                           - Resume Plavix  (clopidogrel ) at prior dose in 3                            days.                           - Await pathology results.                           - No repeat surveillance colonoscopy due to age and  current guidelines. Procedure Code(s):        --- Professional ---                           [id number], Colonoscopy, flexible; with removal of                            tumor(s), polyp(s), or other lesion(s) by snare                            technique Diagnosis Code(s):        --- Professional ---                           D12.2, Benign neoplasm of ascending colon                           D12.3, Benign neoplasm of transverse colon (hepatic                            flexure or splenic flexure)                           K64.8, Other hemorrhoids                           D37.4, Neoplasm of uncertain behavior of colon                           K57.30, Diverticulosis of large intestine without                            perforation or abscess without bleeding CPT copyright 2022 American Medical Association. All rights reserved. The codes documented in this report are preliminary and upon coder review may  be revised to meet current compliance requirements. Jamiaya Bina L. Legrand, MD 12/27/2023 10:14:51 AM This report has been signed electronically. Number of Addenda: 0

## 2023-12-27 NOTE — Anesthesia Preprocedure Evaluation (Signed)
 Anesthesia Evaluation  Patient identified by MRN, date of birth, ID band Patient awake    Reviewed: Allergy & Precautions, H&P , NPO status , Patient's Chart, lab work & pertinent test results  Airway Mallampati: III  TM Distance: >3 FB Neck ROM: Full    Dental no notable dental hx. (+) Dental Advisory Given   Pulmonary shortness of breath, former smoker   Pulmonary exam normal breath sounds clear to auscultation       Cardiovascular hypertension, Pt. on home beta blockers pulmonary hypertension+ CAD, + Peripheral Vascular Disease and +CHF  Normal cardiovascular exam+ dysrhythmias Atrial Fibrillation  Rhythm:Regular Rate:Normal  Echo 10/2023  1. Left ventricular ejection fraction, by estimation, is 60 to 65%. The  left ventricle has normal function. The left ventricle has no regional  wall motion abnormalities. Left ventricular diastolic parameters are  consistent with Grade II diastolic  dysfunction (pseudonormalization). The average left ventricular global  longitudinal strain is -20.6 %. The global longitudinal strain is normal.   2. Right ventricular systolic function is normal. The right ventricular  size is normal. There is moderately elevated pulmonary artery systolic  pressure. The estimated right ventricular systolic pressure is 47.9 mmHg.   3. Left atrial size was moderately dilated.   4. Right atrial size was mildly dilated.   5. The mitral valve is normal in structure. Mild to moderate mitral valve  regurgitation. No evidence of mitral stenosis.   6. Tricuspid valve regurgitation is mild to moderate.   7. The aortic valve is tricuspid. Aortic valve regurgitation is not  visualized. No aortic stenosis is present.   8. The inferior vena cava is normal in size with greater than 50%  respiratory variability, suggesting right atrial pressure of 3 mmHg.      Stress/MPS 09/2023   The study is normal. The study is low  risk.   No ST deviation was noted.   LV perfusion is normal. There is no evidence of ischemia. There is no evidence of infarction.   Left ventricular function is normal. Nuclear stress EF: 82%. The left ventricular ejection fraction is hyperdynamic (>65%). End diastolic cavity size is normal. End systolic cavity size is normal. No evidence of transient ischemic dilation (TID) noted.   CT images were obtained for attenuation correction and were examined for the presence of coronary calcium  when appropriate.   Coronary calcium  was present on the attenuation correction CT images. Severe coronary calcifications were present. Coronary calcifications were present in the left anterior descending artery, left circumflex artery and right coronary artery distribution(s).   Prior study available for comparison from 06/14/2015. No changes compared to prior study.    Neuro/Psych  Headaches  Anxiety     CVA    GI/Hepatic ,GERD  ,,  Endo/Other    Renal/GU      Musculoskeletal  (+) Arthritis ,    Abdominal   Peds  Hematology  (+) Blood dyscrasia, anemia Hemoglobin 9.4   Anesthesia Other Findings   Reproductive/Obstetrics                              Anesthesia Physical Anesthesia Plan  ASA: 3  Anesthesia Plan: MAC   Post-op Pain Management: Minimal or no pain anticipated   Induction: Intravenous  PONV Risk Score and Plan: 2 and Propofol  infusion, Treatment may vary due to age or medical condition and TIVA  Airway Management Planned: Nasal Cannula  Additional Equipment:   Intra-op  Plan:   Post-operative Plan:   Informed Consent: I have reviewed the patients History and Physical, chart, labs and discussed the procedure including the risks, benefits and alternatives for the proposed anesthesia with the patient or authorized representative who has indicated his/her understanding and acceptance.     Dental advisory given  Plan Discussed with:  CRNA  Anesthesia Plan Comments:         Anesthesia Quick Evaluation

## 2023-12-27 NOTE — Interval H&P Note (Signed)
 History and Physical Interval Note:  12/27/2023 9:18 AM  Susan Gillespie  has presented today for surgery, with the diagnosis of Z87.19 Hx of GI bleed, D64.9 Anemia, z86.0100 Hx of Colon Polyps, K57.90 Diverticulitis, Z79.01 Chronic Anticoagulation.  The various methods of treatment have been discussed with the patient and family. After consideration of risks, benefits and other options for treatment, the patient has consented to  Procedure(s): COLONOSCOPY (N/A) as a surgical intervention.  The patient's history has been reviewed, patient examined, no change in status, stable for surgery.  I have reviewed the patient's chart and labs.  Questions were answered to the patient's satisfaction.     Victory LITTIE Brand III

## 2023-12-27 NOTE — Transfer of Care (Signed)
 Immediate Anesthesia Transfer of Care Note  Patient: Susan Gillespie  Procedure(s) Performed: COLONOSCOPY  Patient Location: PACU  Anesthesia Type:MAC  Level of Consciousness: awake, alert , and oriented  Airway & Oxygen Therapy: Patient Spontanous Breathing and Patient connected to face mask oxygen  Post-op Assessment: Report given to RN and Post -op Vital signs reviewed and stable  Post vital signs: Reviewed and stable  Last Vitals:  Vitals Value Taken Time  BP 107/57 12/27/23 10:10  Temp    Pulse 60 12/27/23 10:11  Resp 16 12/27/23 10:11  SpO2 100 % 12/27/23 10:11  Vitals shown include unfiled device data.  Last Pain:  Vitals:   12/27/23 0755  TempSrc: Temporal  PainSc: 0-No pain         Complications: No notable events documented.

## 2023-12-28 LAB — SURGICAL PATHOLOGY

## 2023-12-30 ENCOUNTER — Encounter (HOSPITAL_COMMUNITY): Payer: Self-pay | Admitting: Gastroenterology

## 2023-12-31 ENCOUNTER — Other Ambulatory Visit (HOSPITAL_BASED_OUTPATIENT_CLINIC_OR_DEPARTMENT_OTHER): Payer: Self-pay

## 2023-12-31 ENCOUNTER — Ambulatory Visit: Payer: Self-pay | Admitting: Gastroenterology

## 2023-12-31 NOTE — Anesthesia Postprocedure Evaluation (Signed)
 Anesthesia Post Note  Patient: Susan Gillespie  Procedure(s) Performed: COLONOSCOPY     Patient location during evaluation: Endoscopy Anesthesia Type: MAC Level of consciousness: awake and alert Pain management: pain level controlled Vital Signs Assessment: post-procedure vital signs reviewed and stable Respiratory status: spontaneous breathing Cardiovascular status: stable Anesthetic complications: no   No notable events documented.  Last Vitals:  Vitals:   12/27/23 1020 12/27/23 1030  BP: (!) 109/40 (!) 123/43  Pulse: (!) 59 (!) 59  Resp: 16 18  Temp:    SpO2: 100% 100%    Last Pain:  Vitals:   12/28/23 1703  TempSrc:   PainSc: 0-No pain                 Norleen Pope

## 2024-01-01 NOTE — Telephone Encounter (Signed)
 Letter mailed to pt.

## 2024-01-13 ENCOUNTER — Other Ambulatory Visit: Payer: Self-pay | Admitting: Internal Medicine

## 2024-01-13 MED ORDER — FAMOTIDINE 40 MG PO TABS
40.0000 mg | ORAL_TABLET | Freq: Every day | ORAL | 2 refills | Status: AC
  Filled 2024-01-13: qty 90, 90d supply, fill #0
  Filled 2024-04-01: qty 90, 90d supply, fill #1

## 2024-01-14 ENCOUNTER — Other Ambulatory Visit (HOSPITAL_COMMUNITY): Payer: Self-pay

## 2024-01-14 ENCOUNTER — Other Ambulatory Visit: Payer: Self-pay

## 2024-01-17 ENCOUNTER — Other Ambulatory Visit (HOSPITAL_BASED_OUTPATIENT_CLINIC_OR_DEPARTMENT_OTHER): Payer: Self-pay

## 2024-01-23 ENCOUNTER — Other Ambulatory Visit (HOSPITAL_BASED_OUTPATIENT_CLINIC_OR_DEPARTMENT_OTHER): Payer: Self-pay

## 2024-02-04 ENCOUNTER — Other Ambulatory Visit: Payer: Self-pay

## 2024-02-05 ENCOUNTER — Other Ambulatory Visit (HOSPITAL_BASED_OUTPATIENT_CLINIC_OR_DEPARTMENT_OTHER): Payer: Self-pay

## 2024-02-14 ENCOUNTER — Ambulatory Visit

## 2024-02-14 VITALS — BP 122/70 | HR 62 | Ht 62.5 in | Wt 150.4 lb

## 2024-02-14 DIAGNOSIS — Z0000 Encounter for general adult medical examination without abnormal findings: Secondary | ICD-10-CM

## 2024-02-14 DIAGNOSIS — Z780 Asymptomatic menopausal state: Secondary | ICD-10-CM | POA: Diagnosis not present

## 2024-02-14 NOTE — Progress Notes (Signed)
 Subjective:   Susan Gillespie is a 78 y.o. who presents for a Medicare Wellness preventive visit.  As a reminder, Annual Wellness Visits don't include a physical exam, and some assessments may be limited, especially if this visit is performed virtually. We may recommend an in-person follow-up visit with your provider if needed.  Visit Complete: In person  Persons Participating in Visit: Patient.  AWV Questionnaire: Yes: Patient Medicare AWV questionnaire was completed by the patient on 02/10/2024; I have confirmed that all information answered by patient is correct and no changes since this date.  Cardiac Risk Factors include: advanced age (>52men, >58 women);hypertension;Other (see comment), Risk factor comments: CAD, CHF, PAF     Objective:    Today's Vitals   02/10/24 1708 02/14/24 1515  BP:  122/70  Pulse:  62  SpO2:  99%  Weight:  150 lb 6.4 oz (68.2 kg)  Height:  5' 2.5 (1.588 m)  PainSc: 4     Body mass index is 27.07 kg/m.     02/14/2024    3:21 PM 12/27/2023    7:51 AM 09/17/2023    8:44 AM 09/16/2023   12:24 PM 09/15/2023   11:50 AM 06/24/2023    9:39 PM 01/31/2023    3:10 PM  Advanced Directives  Does Patient Have a Medical Advance Directive? No No Yes No No No Yes  Type of Advance Directive   Living will    Healthcare Power of [place];Living will  Copy of Healthcare Power of Attorney in Chart?       No - copy requested  Would patient like information on creating a medical advance directive?  No - Patient declined No - Patient declined No - Patient declined No - Patient declined      Current Medications (verified) Outpatient Encounter Medications as of 02/14/2024  Medication Sig   ALPRAZolam  (XANAX ) 0.5 MG tablet Take ONE-HALF to 1 tablets (0.25-0.5 mg total) by mouth at bedtime as needed   ascorbic acid (VITAMIN C) 500 MG tablet Take 500 mg by mouth daily.   aspirin  EC 81 MG tablet Take 1 tablet (81 mg total) by mouth daily. Swallow whole.   cetirizine  (ZYRTEC) 10 MG tablet Take 10 mg by mouth every morning.   Cholecalciferol  (VITAMIN D3) 50 MCG (2000 UT) capsule    clopidogrel  (PLAVIX ) 75 MG tablet Take 1 tablet (75 mg total) by mouth daily.   diltiazem  (CARDIZEM  CD) 180 MG 24 hr capsule Take 1 capsule (180 mg total) by mouth at bedtime. Patient needs appointment with Cardiology [phone] for further refills.   estradiol  (ESTRACE ) 0.1 MG/GM vaginal cream Place 1 Applicatorful vaginally 2 (two) times a week.   famotidine  (PEPCID ) 40 MG tablet Take 1 tablet (40 mg total) by mouth daily.   ferrous sulfate  325 (65 FE) MG tablet Take 325 mg by mouth daily with breakfast.   fluticasone  (FLONASE ) 50 MCG/ACT nasal spray Place 1 spray into both nostrils daily as needed for allergies.   furosemide  (LASIX ) 20 MG tablet TAKE 1 TABLET (20 MG TOTAL) BY MOUTH DAILY. MAY TAKE AN ADDITIONAL TABLET FOR EDEMA   losartan  (COZAAR ) 100 MG tablet Take 1 tablet (100 mg total) by mouth daily.   methocarbamol  (ROBAXIN ) 500 MG tablet Take 1 tablet (500 mg total) by mouth daily as needed for muscle spasms.   metoprolol  succinate (TOPROL -XL) 50 MG 24 hr tablet Take 50 mg by mouth as needed (svt). Take with or immediately following a meal. -   mirabegron   ER (MYRBETRIQ ) 50 MG TB24 tablet Take 1 tablet (50 mg total) by mouth daily.   Multiple Vitamin (MULTI-VITAMIN DAILY PO)    Multiple Vitamins-Minerals (PRESERVISION AREDS 2+MULTI VIT) CAPS    nitroGLYCERIN  (NITROSTAT ) 0.4 MG SL tablet Place 0.4 mg under the tongue every 5 (five) minutes as needed for chest pain.   Omega-3 Fatty Acids (FISH OIL PO) Take 1 tablet by mouth daily.   potassium chloride  (KLOR-CON ) 10 MEQ tablet Take 1 tablet (10 mEq total) by mouth daily.   rosuvastatin  (CRESTOR ) 20 MG tablet Take 1 tablet (20 mg total) by mouth daily.   traZODone  (DESYREL ) 100 MG tablet Take 1.5 tablets (150 mg total) by mouth at bedtime.   vitamin B-12 (CYANOCOBALAMIN ) 500 MCG tablet Take 500 mcg by mouth daily.   No  facility-administered encounter medications on file as of 02/14/2024.    Allergies (verified) Phenergan  [promethazine  hcl], Daypro [oxaprozin], Lisinopril , Oxycodone , Simvastatin, and Tramadol    History: Past Medical History:  Diagnosis Date   Anxiety    Aortic atherosclerosis    Artery stenosis    left internal carotid, left subclavian   Arthritis    Atherosclerosis of subclavian artery    Bleeding tendency 05/16/2012   Hx post op bleeding (epidural hematoma s/p ACDF '10; LLQ hematoma due to superficial vessel fascial bleed post colon resection; Normal platelet count; saw Dr. Freddie '14   CAD in native artery    a. mod to mod-severe CAD by cath 10/2016, med rx.   Carotid stenosis, bilateral 08/31/2016   1-39% right and 50-69% lefft ICA stenosis dopplers 04/2019   Chronic diastolic CHF (congestive heart failure) (HCC)    Colon polyp    Diverticulitis    Family history of cardiovascular disease    Fracture of right fibula    GERD (gastroesophageal reflux disease)    Headache(784.0)    mirgraines - history of   Hearing aid worn    B/L   HOH (hard of hearing)    wears hearing aids   Hyperlipidemia    Hypertension    IBS (irritable bowel syndrome)    followed by Dr. Luis   Liver cyst    Melanoma (HCC)    removed from back 04/2013   PAF (paroxysmal atrial fibrillation) (HCC)    Shortness of breath dyspnea    with exertion   Stroke (HCC) 06/2012   mild   SVT (supraventricular tachycardia)    Urinary urgency    UTI (lower urinary tract infection)    x 2 since June 2016   Wears glasses    Past Surgical History:  Procedure Laterality Date   ABDOMINAL HYSTERECTOMY     ANGIOPLASTY     stent in subclavian LEFT 2016, angioplasty of stent 2017   APPENDECTOMY     AUGMENTATION MAMMAPLASTY Bilateral    replaced silicone with saline 2004   BONE BIOPSY  09/16/2023   Procedure: BIOPSY, GI;  Surgeon: Legrand Victory LITTIE DOUGLAS, MD;  Location: [facility];  Service:  Gastroenterology;;   BREAST EXCISIONAL BIOPSY Left    benign 1977   BREAST SURGERY     BIL breast augmentation   CHOLECYSTECTOMY     COLON SURGERY     Sigmoid Colectomy, returned for post op bleeding   COLONOSCOPY N/A 09/17/2023   Procedure: COLONOSCOPY;  Surgeon: Suzann Inocente HERO, MD;  Location: [facility]COPY;  Service: Gastroenterology;  Laterality: N/A;   COLONOSCOPY N/A 12/27/2023   Procedure: COLONOSCOPY;  Surgeon: Legrand Victory LITTIE DOUGLAS, MD;  Location: [facility];  Service: Gastroenterology;  Laterality: N/A;   COLONOSCOPY W/ POLYPECTOMY     COLONOSCOPY WITH PROPOFOL  N/A 05/05/2016   Procedure: COLONOSCOPY WITH PROPOFOL ;  Surgeon: Alm Angle, MD;  Location: [facility];  Service: General;  Laterality: N/A;   ESOPHAGOGASTRODUODENOSCOPY N/A 09/16/2023   Procedure: EGD (ESOPHAGOGASTRODUODENOSCOPY);  Surgeon: Legrand Victory LITTIE DOUGLAS, MD;  Location: [facility] ENDOSCOPY;  Service: Gastroenterology;  Laterality: N/A;   ESOPHAGOGASTRODUODENOSCOPY (EGD) WITH PROPOFOL  N/A 05/05/2016   Procedure: ESOPHAGOGASTRODUODENOSCOPY (EGD) WITH PROPOFOL ;  Surgeon: Alm Angle, MD;  Location: [facility];  Service: General;  Laterality: N/A;   EYE SURGERY     eyelid drooping fixed   GIVENS CAPSULE STUDY N/A 09/19/2023   Procedure: IMAGING PROCEDURE, GI TRACT, INTRALUMINAL, VIA CAPSULE;  Surgeon: Suzann Inocente HERO, MD;  Location: [facility];  Service: Gastroenterology;  Laterality: N/A;   IR ANGIO INTRA EXTRACRAN SEL COM CAROTID INNOMINATE BILAT MOD SED  10/16/2018   IR ANGIO VERTEBRAL SEL SUBCLAVIAN INNOMINATE UNI L MOD SED  10/16/2018   IR ANGIO VERTEBRAL SEL VERTEBRAL UNI R MOD SED  10/16/2018   IR GENERIC HISTORICAL  12/24/2015   IR ANGIO INTRA EXTRACRAN SEL COM CAROTID INNOMINATE BILAT MOD SED 12/24/2015 Thyra Nash, MD [facility]   IR GENERIC HISTORICAL  12/24/2015   IR ANGIOGRAM EXTREMITY LEFT 12/24/2015 Thyra Nash, MD [facility]   IR GENERIC HISTORICAL  12/24/2015   IR ANGIO VERTEBRAL SEL  VERTEBRAL UNI R MOD SED 12/24/2015 Thyra Nash, MD [facility]   IR GENERIC HISTORICAL  01/05/2016   IR PTA NON CORO-LOWER EXTREM 01/05/2016 Thyra Nash, MD [facility]   IR GENERIC HISTORICAL  03/07/2016   IR RADIOLOGIST EVAL & MGMT 03/07/2016 [facility]   IR RADIOLOGIST EVAL & MGMT  11/30/2022   JOINT REPLACEMENT Left    thumb   LEFT HEART CATH AND CORONARY ANGIOGRAPHY N/A 11/10/2016   Procedure: Left Heart Cath and Coronary Angiography;  Surgeon: Claudene Victory ORN, MD;  Location: [facility] INVASIVE CV LAB;  Service: Cardiovascular;  Laterality: N/A;   MASTOID DEBRIDEMENT     MASTOIDECTOMY REVISION     RADIOLOGY WITH ANESTHESIA N/A 11/09/2014   Procedure: STENT PLACEMENT;  Surgeon: Thyra Nash, MD;  Location: [facility];  Service: Radiology;  Laterality: N/A;   RADIOLOGY WITH ANESTHESIA N/A 05/31/2015   Procedure: RADIOLOGY WITH ANESTHESIA;  Surgeon: Thyra Nash, MD;  Location: [facility];  Service: Radiology;  Laterality: N/A;   RADIOLOGY WITH ANESTHESIA N/A 01/05/2016   Procedure: RADIOLOGY WITH ANESTHESIA ANGIOPLASTY WITH STENTNG;  Surgeon: Thyra Nash, MD;  Location: [facility];  Service: Radiology;  Laterality: N/A;   RADIOLOGY WITH ANESTHESIA N/A 10/16/2018   Procedure: STENTING;  Surgeon: Nash Thyra, MD;  Location: [facility];  Service: Radiology;  Laterality: N/A;   skin cancer excised  04/2013   Melonoma   SPINE SURGERY     cervical fusion, returned to OR for post op  bleeding   TONSILLECTOMY     TONSILLECTOMY     TUBAL LIGATION     VAGINA SURGERY     anterior posterior repair   Family History  Problem Relation Age of Onset   Heart disease Father    Heart disease Sister    Heart attack Sister    Cancer - Lung Sister    Heart disease Brother    Heart attack Brother    Stroke Son    Hyperlipidemia Other    Hypertension Other    Cancer Other        lung,  esophagus, stomach   Stroke Other    Breast cancer Neg Hx    BRCA 1/2 Neg Hx    Social History    Socioeconomic History   Marital status: Divorced    Spouse name: Not on file   Number of children: 2   Years of education: 14   Highest education level: Associate degree: occupational, scientist, product/process development, or vocational program  Occupational History   Occupation: Academic Librarian: [employer]    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[employer], retired   Occupation: RETIRED  Tobacco Use   Smoking status: Former    Current packs/day: 0.00    Average packs/day: 2.0 packs/day for 22.0 years (44.0 ttl pk-yrs)    Types: Cigarettes    Start date: 04/24/1963    Quit date: 04/23/1985    Years since quitting: 38.8   Smokeless tobacco: Never  Vaping Use   Vaping status: Never Used  Substance and Sexual Activity   Alcohol use: Yes    Alcohol/week: 4.0 standard drinks of alcohol    Types: 4 Glasses of wine per week    Comment: 1 drink a  day   Drug use: No   Sexual activity: Not Currently    Birth control/protection: Surgical  Other Topics Concern   Not on file  Social History Narrative   ** Merged History Encounter **       Domestic partner   Regular exercise-no   Right handed   Caffeine use-- 2 cups coffee daily, rare soda   Lives alone/2025   Social Drivers of Health   Financial Resource Strain: Low Risk  (02/10/2024)   Overall Financial Resource Strain (CARDIA)    Difficulty of Paying Living Expenses: Not hard at all  Food Insecurity: No Food Insecurity (02/10/2024)   Hunger Vital Sign    Worried About Running Out of Food in the Last Year: Never true    Ran Out of Food in the Last Year: Never true  Transportation Needs: Unknown (02/10/2024)   PRAPARE - Administrator, Civil Service (Medical): No    Lack of Transportation (Non-Medical): Not on file  Physical Activity: Insufficiently Active (02/10/2024)   Exercise Vital Sign    Days of Exercise per Week: 3 days    Minutes of Exercise per Session: 10 min  Stress: No Stress Concern Present (02/10/2024)   [employer]  of Occupational Health - Occupational Stress Questionnaire    Feeling of Stress: Only a little  Recent Concern: Stress - Stress Concern Present (11/16/2023)   [employer] of Occupational Health - Occupational Stress Questionnaire    Feeling of Stress: To some extent  Social Connections: Unknown (02/10/2024)   Social Connection and Isolation Panel    Frequency of Communication with Friends and Family: More than three times a week    Frequency of Social Gatherings with Friends and Family: More than three times a week    Attends Religious Services: Not on Marketing Executive or Organizations: No    Attends Engineer, Structural: Not on file    Marital Status: Divorced  Recent Concern: Social Connections - Moderately Isolated (11/16/2023)   Social Connection and Isolation Panel    Frequency of Communication with Friends and Family: More than three times a week    Frequency of Social Gatherings with Friends and Family: More than three times a week    Attends Religious Services: 1 to 4 times per year  Active Member of Clubs or Organizations: No    Attends Engineer, Structural: Not on file    Marital Status: Divorced    Tobacco Counseling Counseling given: Not Answered    Clinical Intake:  Pre-visit preparation completed: Yes  Pain : 0-10 Pain Score: 4  Pain Type: Chronic pain Pain Location: Knee (back) Pain Orientation: Right Pain Descriptors / Indicators: Aching, Discomfort Pain Onset: More than a month ago Pain Frequency: Intermittent Pain Relieving Factors: Tylenol   Pain Relieving Factors: Tylenol   BMI - recorded: 27.07 Nutritional Status: BMI 25 -29 Overweight Nutritional Risks: None Diabetes: No  Lab Results  Component Value Date   HGBA1C 5.6 11/20/2023   HGBA1C 6.3 05/23/2023   HGBA1C 6.3 11/22/2022     How often do you need to have someone help you when you read instructions, pamphlets, or other written materials from your  doctor or pharmacy?: 1 - Never  Interpreter Needed?: No  Information entered by :: Cordarryl Monrreal, RMA   Activities of Daily Living     02/10/2024    5:08 PM 09/15/2023    4:51 PM  In your present state of health, do you have any difficulty performing the following activities:  Hearing? 1 1  Comment wears hearing aides   Vision? 0 0  Difficulty concentrating or making decisions? 0 0  Walking or climbing stairs? 1   Dressing or bathing? 0   Doing errands, shopping? 0 0  Preparing Food and eating ? N   Using the Toilet? N   In the past six months, have you accidently leaked urine? Y   Do you have problems with loss of bowel control? N   Managing your Medications? N   Housekeeping or managing your Housekeeping? N     Patient Care Team: Geofm Glade PARAS, MD as PCP - General (Internal Medicine) Shlomo Wilbert SAUNDERS, MD as PCP - Cardiology (Cardiology) Debarah Lorrene DEL., MD as Consulting Physician (Ophthalmology)  I have updated your Care Teams any recent Medical Services you may have received from other providers in the past year.     Assessment:   This is a routine wellness examination for [place].  Hearing/Vision screen Hearing Screening - Comments:: wears hearing aides Vision Screening - Comments:: Wears eyeglasses/Dr .Debarah   Goals Addressed             This Visit's Progress    Maintain my health  and gain strength in my hands.   On track      Depression Screen     02/14/2024    3:26 PM 11/20/2023    9:37 AM 05/23/2023    9:30 AM 01/31/2023    2:33 PM 11/22/2022   10:09 AM 11/21/2021    3:50 PM 11/16/2021   10:45 AM  PHQ 2/9 Scores  PHQ - 2 Score 0 0 0 0 0 0 0  PHQ- 9 Score 0 2  0 0 0 0    Fall Risk     02/10/2024    5:08 PM 11/20/2023    9:36 AM 05/23/2023    9:30 AM 01/31/2023    2:34 PM 01/30/2023    9:15 PM  Fall Risk   Falls in the past year? 1 1 0 1 1  Number falls in past yr: 1 0 0 0 0  Injury with Fall? 1 0 0 0 0  Risk for fall due to :  No Fall Risks No Fall  Risks No Fall Risks   Follow up Falls evaluation  completed;Falls prevention discussed Falls evaluation completed Falls evaluation completed Falls prevention discussed     MEDICARE RISK AT HOME:  Medicare Risk at Home Any stairs in or around the home?: (Patient-Rptd) No If so, are there any without handrails?: (Patient-Rptd) No Home free of loose throw rugs in walkways, pet beds, electrical cords, etc?: (Patient-Rptd) Yes Adequate lighting in your home to reduce risk of falls?: (Patient-Rptd) Yes Life alert?: (Patient-Rptd) No Use of a cane, walker or w/c?: (Patient-Rptd) No Grab bars in the bathroom?: (Patient-Rptd) No Shower chair or bench in shower?: (Patient-Rptd) Yes Elevated toilet seat or a handicapped toilet?: (Patient-Rptd) No  TIMED UP AND GO:  Was the test performed?  Yes  Length of time to ambulate 10 feet: 15 sec Gait slow and steady without use of assistive device  Cognitive Function: 6CIT completed    01/31/2023    3:11 PM  MMSE - Mini Mental State Exam  Orientation to time 5  Orientation to Place 5  Registration 3  Attention/ Calculation 5  Recall 3  Language- name 2 objects 2  Language- repeat 1  Language- follow 3 step command 3  Language- read & follow direction 1  Write a sentence 1  Copy design 1  Total score 30        02/14/2024    3:24 PM 01/31/2023    2:35 PM 11/21/2021    3:53 PM 07/29/2019   11:39 AM  6CIT Screen  What Year? 0 points 0 points 0 points 0 points  What month? 0 points 0 points 0 points 0 points  What time? 0 points 0 points 0 points 0 points  Count back from 20 0 points 0 points 0 points 0 points  Months in reverse 0 points 0 points 0 points 0 points  Repeat phrase 0 points 0 points 0 points 0 points  Total Score 0 points 0 points 0 points 0 points    Immunizations Immunization History  Administered Date(s) Administered   Fluad Quad(high Dose 65+) 02/03/2021, 03/05/2022   INFLUENZA, HIGH DOSE SEASONAL PF 01/19/2017,  02/08/2018, 01/28/2020, 01/28/2023   Influenza Whole 02/14/2012   Influenza, Quadrivalent, Recombinant, Inj, Pf 01/18/2019   Influenza,inj,Quad PF,6+ Mos 01/06/2016   Influenza,inj,quad, With Preservative 01/12/2014   Influenza-Unspecified 01/15/2013, 02/03/2015, 01/19/2017   PFIZER Comirnaty(Gray Top)Covid-19 Tri-Sucrose Vaccine 07/31/2020   PFIZER(Purple Top)SARS-COV-2 Vaccination 05/02/2019, 05/23/2019, 02/21/2020   Pfizer Covid-19 Vaccine Bivalent Booster 57yrs & up 01/21/2021   Pneumococcal Conjugate-13 04/28/2009, 01/12/2014   Pneumococcal Polysaccharide-23 11/06/2018   Td 04/18/2007   Tdap 06/14/2021   Zoster Recombinant(Shingrix) 01/18/2019, 04/18/2019   Zoster, Live 04/28/2009    Screening Tests Health Maintenance  Topic Date Due   Influenza Vaccine  11/16/2023   COVID-19 Vaccine (6 - 2025-26 season) 12/17/2023   DEXA SCAN  01/13/2024   Medicare Annual Wellness (AWV)  02/13/2025   DTaP/Tdap/Td (3 - Td or Tdap) 06/15/2031   Pneumococcal Vaccine: 50+ Years  Completed   Hepatitis C Screening  Completed   Zoster Vaccines- Shingrix  Completed   Meningococcal B Vaccine  Aged Out   Mammogram  Discontinued   Colonoscopy  Discontinued    Health Maintenance Items Addressed: Vaccines Due: flu, DEXA ordered, See Nurse Notes at the end of this note  Additional Screening:  Vision Screening: Recommended annual ophthalmology exams for early detection of glaucoma and other disorders of the eye. Is the patient up to date with their annual eye exam?  Yes  Who is the provider or what is the  name of the office in which the patient attends annual eye exams? Dr. Franz pt-up to date  Dental Screening: Recommended annual dental exams for proper oral hygiene  Community Resource Referral / Chronic Care Management: CRR required this visit?  No   CCM required this visit?  No   Plan:    I have personally reviewed and noted the following in the patient's chart:   Medical and  social history Use of alcohol, tobacco or illicit drugs  Current medications and supplements including opioid prescriptions. Patient is not currently taking opioid prescriptions. Functional ability and status Nutritional status Physical activity Advanced directives List of other physicians Hospitalizations, surgeries, and ER visits in previous 12 months Vitals Screenings to include cognitive, depression, and falls Referrals and appointments  In addition, I have reviewed and discussed with patient certain preventive protocols, quality metrics, and best practice recommendations. A written personalized care plan for preventive services as well as general preventive health recommendations were provided to patient.   Darell Saputo L Leane Loring, CMA   02/14/2024   After Visit Summary: (MyChart) Due to this being a telephonic visit, the after visit summary with patients personalized plan was offered to patient via MyChart   Notes: Patient is due for a flu vaccine and stated that she would get that done at the pharmacy.  She is due for a DEXA and order has been placed today.  Patient had no other concerns to address today.

## 2024-02-14 NOTE — Patient Instructions (Signed)
 Susan Gillespie,  Thank you for taking the time for your Medicare Wellness Visit. I appreciate your continued commitment to your health goals. Please review the care plan we discussed, and feel free to reach out if I can assist you further.  Medicare recommends these wellness visits once per year to help you and your care team stay ahead of potential health issues. These visits are designed to focus on prevention, allowing your provider to concentrate on managing your acute and chronic conditions during your regular appointments.  Please note that Annual Wellness Visits do not include a physical exam. Some assessments may be limited, especially if the visit was conducted virtually. If needed, we may recommend a separate in-person follow-up with your provider.  Ongoing Care Seeing your primary care provider every 3 to 6 months helps us  monitor your health and provide consistent, personalized care. Next office on 05/28/2024.  You are due for a Flu vaccine and can get that done at your local pharmacy.  Keep up the good work.  Referrals If a referral was made during today's visit and you haven't received any updates within two weeks, please contact the referred provider directly to check on the status. You have an order for: [x]   Bone Density     Please call for appointment:  [facility] [address] [place], [place] [address] [phone]  Make sure to wear two-piece clothing.  No lotions, powders, or deodorants the day of the appointment. Make sure to bring picture ID and insurance card.  Bring list of medications you are currently taking including any supplements.    Recommended Screenings:  Health Maintenance  Topic Date Due   Flu Shot  11/16/2023   COVID-19 Vaccine (6 - 2025-26 season) 12/17/2023   DEXA scan (bone density measurement)  01/13/2024   Medicare Annual Wellness Visit  02/13/2025   DTaP/Tdap/Td vaccine (3 - Td or Tdap) 06/15/2031   Pneumococcal  Vaccine for age over 55  Completed   Hepatitis C Screening  Completed   Zoster (Shingles) Vaccine  Completed   Meningitis B Vaccine  Aged Out   Breast Cancer Screening  Discontinued   Colon Cancer Screening  Discontinued       12/27/2023    7:51 AM  Advanced Directives  Does Patient Have a Medical Advance Directive? No  Would patient like information on creating a medical advance directive? No - Patient declined   Advance Care Planning is important because it: Ensures you receive medical care that aligns with your values, goals, and preferences. Provides guidance to your family and loved ones, reducing the emotional burden of decision-making during critical moments.  Vision: Annual vision screenings are recommended for early detection of glaucoma, cataracts, and diabetic retinopathy. These exams can also reveal signs of chronic conditions such as diabetes and high blood pressure.  Dental: Annual dental screenings help detect early signs of oral cancer, gum disease, and other conditions linked to overall health, including heart disease and diabetes.  Please see the attached documents for additional preventive care recommendations.

## 2024-02-23 ENCOUNTER — Other Ambulatory Visit (HOSPITAL_COMMUNITY): Payer: Self-pay

## 2024-02-26 ENCOUNTER — Other Ambulatory Visit: Payer: Self-pay

## 2024-03-03 ENCOUNTER — Encounter: Payer: Self-pay | Admitting: Internal Medicine

## 2024-03-10 ENCOUNTER — Other Ambulatory Visit: Payer: Self-pay

## 2024-03-10 ENCOUNTER — Other Ambulatory Visit: Payer: Self-pay | Admitting: Internal Medicine

## 2024-03-11 ENCOUNTER — Other Ambulatory Visit (HOSPITAL_BASED_OUTPATIENT_CLINIC_OR_DEPARTMENT_OTHER): Payer: Self-pay

## 2024-03-11 MED ORDER — ALPRAZOLAM 0.5 MG PO TABS
0.2500 mg | ORAL_TABLET | Freq: Every evening | ORAL | 5 refills | Status: AC | PRN
  Filled 2024-03-11: qty 30, 30d supply, fill #0
  Filled 2024-04-13: qty 30, 30d supply, fill #1
  Filled 2024-05-18: qty 30, 30d supply, fill #2

## 2024-04-01 ENCOUNTER — Other Ambulatory Visit: Payer: Self-pay

## 2024-04-01 ENCOUNTER — Other Ambulatory Visit: Payer: Self-pay | Admitting: Internal Medicine

## 2024-04-01 ENCOUNTER — Other Ambulatory Visit (HOSPITAL_COMMUNITY): Payer: Self-pay

## 2024-04-01 DIAGNOSIS — E785 Hyperlipidemia, unspecified: Secondary | ICD-10-CM

## 2024-04-01 DIAGNOSIS — I5032 Chronic diastolic (congestive) heart failure: Secondary | ICD-10-CM

## 2024-04-01 MED ORDER — ROSUVASTATIN CALCIUM 20 MG PO TABS
20.0000 mg | ORAL_TABLET | Freq: Every day | ORAL | 1 refills | Status: AC
  Filled 2024-04-01: qty 90, 90d supply, fill #0

## 2024-04-01 MED ORDER — POTASSIUM CHLORIDE ER 10 MEQ PO TBCR
10.0000 meq | EXTENDED_RELEASE_TABLET | Freq: Every day | ORAL | 1 refills | Status: AC
  Filled 2024-04-01: qty 90, 90d supply, fill #0

## 2024-04-01 MED ORDER — MIRABEGRON ER 50 MG PO TB24
50.0000 mg | ORAL_TABLET | Freq: Every day | ORAL | 2 refills | Status: AC
  Filled 2024-04-01: qty 90, 90d supply, fill #0

## 2024-04-02 ENCOUNTER — Encounter (HOSPITAL_BASED_OUTPATIENT_CLINIC_OR_DEPARTMENT_OTHER): Payer: Self-pay

## 2024-04-02 ENCOUNTER — Other Ambulatory Visit (HOSPITAL_BASED_OUTPATIENT_CLINIC_OR_DEPARTMENT_OTHER): Payer: Self-pay

## 2024-04-03 ENCOUNTER — Other Ambulatory Visit (HOSPITAL_BASED_OUTPATIENT_CLINIC_OR_DEPARTMENT_OTHER): Payer: Self-pay

## 2024-04-04 ENCOUNTER — Other Ambulatory Visit (HOSPITAL_BASED_OUTPATIENT_CLINIC_OR_DEPARTMENT_OTHER): Payer: Self-pay

## 2024-04-14 ENCOUNTER — Other Ambulatory Visit: Payer: Self-pay

## 2024-04-28 ENCOUNTER — Encounter: Payer: Self-pay | Admitting: *Deleted

## 2024-05-05 ENCOUNTER — Ambulatory Visit
Admission: RE | Admit: 2024-05-05 | Discharge: 2024-05-05 | Disposition: A | Source: Ambulatory Visit | Attending: Internal Medicine | Admitting: Internal Medicine

## 2024-05-05 DIAGNOSIS — Z780 Asymptomatic menopausal state: Secondary | ICD-10-CM | POA: Diagnosis not present

## 2024-05-19 ENCOUNTER — Other Ambulatory Visit: Payer: Self-pay

## 2024-05-19 ENCOUNTER — Encounter: Payer: Self-pay | Admitting: Internal Medicine

## 2024-05-20 ENCOUNTER — Other Ambulatory Visit: Payer: Self-pay

## 2024-05-23 ENCOUNTER — Other Ambulatory Visit: Payer: Self-pay

## 2024-05-23 ENCOUNTER — Other Ambulatory Visit (HOSPITAL_COMMUNITY): Payer: Self-pay

## 2024-05-23 MED ORDER — ESTRADIOL 0.01 % VA CREA
TOPICAL_CREAM | VAGINAL | 5 refills | Status: AC
  Filled 2024-05-23: qty 42.5, 84d supply, fill #0

## 2024-05-28 ENCOUNTER — Encounter: Admitting: Internal Medicine

## 2024-06-05 ENCOUNTER — Encounter: Admitting: Internal Medicine

## 2024-08-11 ENCOUNTER — Encounter: Admitting: Internal Medicine
